# Patient Record
Sex: Female | Born: 1935 | Race: White | Hispanic: No | Smoking: Former smoker
Health system: Southern US, Community
[De-identification: ages and names within clinical notes are randomized; demographics above are authoritative.]

## PROBLEM LIST (undated history)

## (undated) DIAGNOSIS — E876 Hypokalemia: Secondary | ICD-10-CM

## (undated) DIAGNOSIS — I4891 Unspecified atrial fibrillation: Secondary | ICD-10-CM

## (undated) DIAGNOSIS — R42 Dizziness and giddiness: Secondary | ICD-10-CM

## (undated) DIAGNOSIS — I272 Pulmonary hypertension, unspecified: Secondary | ICD-10-CM

## (undated) DIAGNOSIS — R55 Syncope and collapse: Secondary | ICD-10-CM

## (undated) DIAGNOSIS — R634 Abnormal weight loss: Secondary | ICD-10-CM

## (undated) DIAGNOSIS — T7840XA Allergy, unspecified, initial encounter: Secondary | ICD-10-CM

## (undated) DIAGNOSIS — R0600 Dyspnea, unspecified: Secondary | ICD-10-CM

## (undated) DIAGNOSIS — R109 Unspecified abdominal pain: Secondary | ICD-10-CM

## (undated) DIAGNOSIS — R350 Frequency of micturition: Secondary | ICD-10-CM

## (undated) DIAGNOSIS — J449 Chronic obstructive pulmonary disease, unspecified: Secondary | ICD-10-CM

## (undated) DIAGNOSIS — I5043 Acute on chronic combined systolic (congestive) and diastolic (congestive) heart failure: Secondary | ICD-10-CM

## (undated) DIAGNOSIS — M1990 Unspecified osteoarthritis, unspecified site: Secondary | ICD-10-CM

## (undated) DIAGNOSIS — I5020 Unspecified systolic (congestive) heart failure: Secondary | ICD-10-CM

## (undated) DIAGNOSIS — S93402A Sprain of unspecified ligament of left ankle, initial encounter: Secondary | ICD-10-CM

## (undated) DIAGNOSIS — Z720 Tobacco use: Secondary | ICD-10-CM

## (undated) DIAGNOSIS — H409 Unspecified glaucoma: Secondary | ICD-10-CM

## (undated) DIAGNOSIS — R739 Hyperglycemia, unspecified: Secondary | ICD-10-CM

## (undated) DIAGNOSIS — R911 Solitary pulmonary nodule: Secondary | ICD-10-CM

## (undated) DIAGNOSIS — M810 Age-related osteoporosis without current pathological fracture: Secondary | ICD-10-CM

## (undated) DIAGNOSIS — E785 Hyperlipidemia, unspecified: Secondary | ICD-10-CM

## (undated) DIAGNOSIS — I10 Essential (primary) hypertension: Secondary | ICD-10-CM

## (undated) HISTORY — DX: Dyspnea, unspecified: R06.00

## (undated) HISTORY — DX: Frequency of micturition: R35.0

## (undated) HISTORY — PX: APPENDECTOMY: SHX54

## (undated) HISTORY — DX: Pulmonary hypertension, unspecified: I27.20

## (undated) HISTORY — DX: Essential (primary) hypertension: I10

## (undated) HISTORY — DX: Unspecified glaucoma: H40.9

## (undated) HISTORY — DX: Syncope and collapse: R55

## (undated) HISTORY — PX: CHOLECYSTECTOMY: SHX55

## (undated) HISTORY — DX: Unspecified abdominal pain: R10.9

## (undated) HISTORY — DX: Dizziness and giddiness: R42

## (undated) HISTORY — DX: Hyperglycemia, unspecified: R73.9

## (undated) HISTORY — PX: HERNIA REPAIR: SHX51

## (undated) HISTORY — DX: Tobacco use: Z72.0

## (undated) HISTORY — DX: Solitary pulmonary nodule: R91.1

## (undated) HISTORY — DX: Allergy, unspecified, initial encounter: T78.40XA

## (undated) HISTORY — DX: Hyperlipidemia, unspecified: E78.5

## (undated) HISTORY — DX: Acute on chronic combined systolic (congestive) and diastolic (congestive) heart failure: I50.43

## (undated) HISTORY — PX: HEMORRHOID SURGERY: SHX153

## (undated) HISTORY — DX: Abnormal weight loss: R63.4

## (undated) HISTORY — DX: Sprain of unspecified ligament of left ankle, initial encounter: S93.402A

## (undated) HISTORY — DX: Age-related osteoporosis without current pathological fracture: M81.0

## (undated) HISTORY — DX: Hypokalemia: E87.6

## (undated) HISTORY — PX: ABDOMINAL HYSTERECTOMY: SHX81

---

## 2004-10-15 ENCOUNTER — Ambulatory Visit: Payer: Self-pay | Admitting: Internal Medicine

## 2004-10-26 ENCOUNTER — Ambulatory Visit: Payer: Self-pay | Admitting: Internal Medicine

## 2004-11-19 ENCOUNTER — Emergency Department (HOSPITAL_COMMUNITY): Admission: EM | Admit: 2004-11-19 | Discharge: 2004-11-19 | Payer: Self-pay | Admitting: Emergency Medicine

## 2004-12-15 ENCOUNTER — Ambulatory Visit: Payer: Self-pay | Admitting: Internal Medicine

## 2004-12-17 ENCOUNTER — Ambulatory Visit: Payer: Self-pay | Admitting: Cardiology

## 2004-12-29 ENCOUNTER — Emergency Department (HOSPITAL_COMMUNITY): Admission: EM | Admit: 2004-12-29 | Discharge: 2004-12-30 | Payer: Self-pay | Admitting: Emergency Medicine

## 2005-01-03 ENCOUNTER — Ambulatory Visit: Payer: Self-pay | Admitting: Internal Medicine

## 2005-01-27 ENCOUNTER — Encounter: Admission: RE | Admit: 2005-01-27 | Discharge: 2005-02-23 | Payer: Self-pay | Admitting: Internal Medicine

## 2005-03-16 ENCOUNTER — Ambulatory Visit: Payer: Self-pay | Admitting: Internal Medicine

## 2005-04-27 ENCOUNTER — Ambulatory Visit: Payer: Self-pay | Admitting: Internal Medicine

## 2005-05-11 ENCOUNTER — Ambulatory Visit: Payer: Self-pay | Admitting: Internal Medicine

## 2005-05-18 ENCOUNTER — Ambulatory Visit: Payer: Self-pay | Admitting: Internal Medicine

## 2005-06-10 ENCOUNTER — Ambulatory Visit: Payer: Self-pay | Admitting: Pulmonary Disease

## 2005-06-16 ENCOUNTER — Ambulatory Visit (HOSPITAL_COMMUNITY): Admission: RE | Admit: 2005-06-16 | Discharge: 2005-06-16 | Payer: Self-pay | Admitting: Pulmonary Disease

## 2005-07-14 ENCOUNTER — Ambulatory Visit: Payer: Self-pay | Admitting: Pulmonary Disease

## 2005-08-12 ENCOUNTER — Encounter: Payer: Self-pay | Admitting: Internal Medicine

## 2005-08-12 ENCOUNTER — Ambulatory Visit: Payer: Self-pay | Admitting: Cardiology

## 2005-08-18 ENCOUNTER — Ambulatory Visit: Payer: Self-pay | Admitting: Pulmonary Disease

## 2006-08-24 ENCOUNTER — Ambulatory Visit: Payer: Self-pay | Admitting: Internal Medicine

## 2006-09-04 ENCOUNTER — Ambulatory Visit: Payer: Self-pay | Admitting: Internal Medicine

## 2007-02-14 ENCOUNTER — Ambulatory Visit: Payer: Self-pay | Admitting: Internal Medicine

## 2007-02-14 DIAGNOSIS — R1084 Generalized abdominal pain: Secondary | ICD-10-CM | POA: Insufficient documentation

## 2007-02-14 DIAGNOSIS — R112 Nausea with vomiting, unspecified: Secondary | ICD-10-CM

## 2007-02-14 DIAGNOSIS — I10 Essential (primary) hypertension: Secondary | ICD-10-CM

## 2007-02-15 LAB — CONVERTED CEMR LAB
ALT: 11 units/L (ref 0–35)
AST: 28 units/L (ref 0–37)
Albumin: 4.2 g/dL (ref 3.5–5.2)
Alkaline Phosphatase: 64 units/L (ref 39–117)
Amylase: 52 units/L (ref 27–131)
BUN: 4 mg/dL — ABNORMAL LOW (ref 6–23)
Basophils Absolute: 0 10*3/uL (ref 0.0–0.1)
Basophils Relative: 0.4 % (ref 0.0–1.0)
Bilirubin Urine: NEGATIVE
Bilirubin, Direct: 0.3 mg/dL (ref 0.0–0.3)
CO2: 28 meq/L (ref 19–32)
Calcium: 10.1 mg/dL (ref 8.4–10.5)
Chloride: 104 meq/L (ref 96–112)
Creatinine, Ser: 0.7 mg/dL (ref 0.4–1.2)
Crystals: NEGATIVE
Eosinophils Absolute: 0.1 10*3/uL (ref 0.0–0.6)
Eosinophils Relative: 1.3 % (ref 0.0–5.0)
GFR calc Af Amer: 103 mL/min
GFR calc non Af Amer: 85 mL/min
Glucose, Bld: 101 mg/dL — ABNORMAL HIGH (ref 70–99)
HCT: 38.8 % (ref 36.0–46.0)
Hemoglobin: 14 g/dL (ref 12.0–15.0)
Ketones, ur: NEGATIVE mg/dL
Leukocytes, UA: NEGATIVE
Lipase: 28 units/L (ref 11.0–59.0)
Lymphocytes Relative: 21.3 % (ref 12.0–46.0)
MCHC: 36 g/dL (ref 30.0–36.0)
MCV: 88.2 fL (ref 78.0–100.0)
Monocytes Absolute: 0.2 10*3/uL (ref 0.2–0.7)
Monocytes Relative: 3.2 % (ref 3.0–11.0)
Mucus, UA: NEGATIVE
Neutro Abs: 5.8 10*3/uL (ref 1.4–7.7)
Neutrophils Relative %: 73.8 % (ref 43.0–77.0)
Nitrite: NEGATIVE
Platelets: 182 10*3/uL (ref 150–400)
Potassium: 4.4 meq/L (ref 3.5–5.1)
RBC: 4.4 M/uL (ref 3.87–5.11)
RDW: 12.4 % (ref 11.5–14.6)
Sodium: 141 meq/L (ref 135–145)
Specific Gravity, Urine: <=1.005 (ref 1.000–1.03)
Total Bilirubin: 1 mg/dL (ref 0.3–1.2)
Total Protein, Urine: NEGATIVE mg/dL
Total Protein: 7.5 g/dL (ref 6.0–8.3)
Urine Glucose: NEGATIVE mg/dL
Urobilinogen, UA: 0.2 (ref 0.0–1.0)
WBC, UA: NONE SEEN cells/hpf
WBC: 7.8 10*3/uL (ref 4.5–10.5)
pH: 6.5 (ref 5.0–8.0)

## 2007-02-20 ENCOUNTER — Ambulatory Visit: Payer: Self-pay | Admitting: Internal Medicine

## 2007-02-20 DIAGNOSIS — R1013 Epigastric pain: Secondary | ICD-10-CM

## 2007-02-20 DIAGNOSIS — R3129 Other microscopic hematuria: Secondary | ICD-10-CM

## 2007-02-20 DIAGNOSIS — R1031 Right lower quadrant pain: Secondary | ICD-10-CM

## 2007-02-20 DIAGNOSIS — K3189 Other diseases of stomach and duodenum: Secondary | ICD-10-CM

## 2007-02-23 ENCOUNTER — Ambulatory Visit: Payer: Self-pay | Admitting: Internal Medicine

## 2007-03-13 ENCOUNTER — Ambulatory Visit: Payer: Self-pay | Admitting: Internal Medicine

## 2007-03-13 DIAGNOSIS — R310 Gross hematuria: Secondary | ICD-10-CM | POA: Insufficient documentation

## 2007-03-19 LAB — CONVERTED CEMR LAB
Bilirubin Urine: NEGATIVE
Ketones, ur: NEGATIVE mg/dL
Leukocytes, UA: NEGATIVE
Nitrite: NEGATIVE
Specific Gravity, Urine: 1.01 (ref 1.000–1.03)
Total Protein, Urine: NEGATIVE mg/dL
Urine Glucose: NEGATIVE mg/dL
Urobilinogen, UA: 0.2 (ref 0.0–1.0)
pH: 5.5 (ref 5.0–8.0)

## 2007-03-20 ENCOUNTER — Ambulatory Visit: Payer: Self-pay | Admitting: Internal Medicine

## 2007-03-20 DIAGNOSIS — L608 Other nail disorders: Secondary | ICD-10-CM | POA: Insufficient documentation

## 2007-03-26 LAB — CONVERTED CEMR LAB: TSH: 3.04 microintl units/mL (ref 0.35–5.50)

## 2007-09-20 ENCOUNTER — Ambulatory Visit: Payer: Self-pay | Admitting: Internal Medicine

## 2007-09-20 DIAGNOSIS — M545 Low back pain: Secondary | ICD-10-CM

## 2007-09-20 LAB — CONVERTED CEMR LAB
TSH: 2.13 microintl units/mL (ref 0.35–5.50)
Thyroglobulin Ab: 30.9 (ref 0.0–60.0)
Thyroperoxidase Ab SerPl-aCnc: <25 (ref 0.0–60.0)
Vit D, 1,25-Dihydroxy: 15 — ABNORMAL LOW (ref 30–89)

## 2007-09-21 ENCOUNTER — Telehealth: Payer: Self-pay | Admitting: Internal Medicine

## 2007-09-27 ENCOUNTER — Encounter: Payer: Self-pay | Admitting: Internal Medicine

## 2007-10-30 ENCOUNTER — Ambulatory Visit: Payer: Self-pay | Admitting: Internal Medicine

## 2007-10-30 DIAGNOSIS — E559 Vitamin D deficiency, unspecified: Secondary | ICD-10-CM | POA: Insufficient documentation

## 2007-10-30 LAB — CONVERTED CEMR LAB: Vit D, 1,25-Dihydroxy: 24 — ABNORMAL LOW (ref 30–89)

## 2007-10-31 ENCOUNTER — Telehealth: Payer: Self-pay | Admitting: Internal Medicine

## 2008-01-08 ENCOUNTER — Ambulatory Visit: Payer: Self-pay | Admitting: Internal Medicine

## 2008-01-08 DIAGNOSIS — H6500 Acute serous otitis media, unspecified ear: Secondary | ICD-10-CM | POA: Insufficient documentation

## 2008-05-07 ENCOUNTER — Ambulatory Visit: Payer: Self-pay | Admitting: Internal Medicine

## 2008-05-07 LAB — CONVERTED CEMR LAB
ALT: 12 units/L (ref 0–35)
AST: 19 units/L (ref 0–37)
BUN: 9 mg/dL (ref 6–23)
CO2: 31 meq/L (ref 19–32)
Calcium: 9.7 mg/dL (ref 8.4–10.5)
Chloride: 107 meq/L (ref 96–112)
Cholesterol: 214 mg/dL (ref 0–200)
Creatinine, Ser: 0.6 mg/dL (ref 0.4–1.2)
GFR calc Af Amer: 123 mL/min
GFR calc non Af Amer: 102 mL/min
Glucose, Bld: 114 mg/dL — ABNORMAL HIGH (ref 70–99)
HDL: 66.8 mg/dL (ref >39.0)
Sodium: 144 meq/L (ref 135–145)
TSH: 3.03 microintl units/mL (ref 0.35–5.50)
Total CHOL/HDL Ratio: 3.2
Triglycerides: 51 mg/dL (ref 0–149)
VLDL: 10 mg/dL (ref 0–40)
Vit D, 1,25-Dihydroxy: 34 (ref 30–89)

## 2008-05-19 ENCOUNTER — Ambulatory Visit: Payer: Self-pay | Admitting: Diagnostic Radiology

## 2008-05-19 ENCOUNTER — Ambulatory Visit: Payer: Self-pay | Admitting: Internal Medicine

## 2008-05-19 ENCOUNTER — Ambulatory Visit (HOSPITAL_BASED_OUTPATIENT_CLINIC_OR_DEPARTMENT_OTHER): Admission: RE | Admit: 2008-05-19 | Discharge: 2008-05-19 | Payer: Self-pay | Admitting: Internal Medicine

## 2008-05-19 ENCOUNTER — Telehealth: Payer: Self-pay | Admitting: Internal Medicine

## 2008-05-19 DIAGNOSIS — M79609 Pain in unspecified limb: Secondary | ICD-10-CM

## 2008-05-19 DIAGNOSIS — R079 Chest pain, unspecified: Secondary | ICD-10-CM

## 2008-05-19 LAB — CONVERTED CEMR LAB

## 2008-06-03 ENCOUNTER — Ambulatory Visit: Payer: Self-pay | Admitting: Internal Medicine

## 2008-06-03 ENCOUNTER — Ambulatory Visit: Payer: Self-pay | Admitting: Interventional Radiology

## 2008-06-03 ENCOUNTER — Ambulatory Visit (HOSPITAL_BASED_OUTPATIENT_CLINIC_OR_DEPARTMENT_OTHER): Admission: RE | Admit: 2008-06-03 | Discharge: 2008-06-03 | Payer: Self-pay | Admitting: Internal Medicine

## 2008-06-03 ENCOUNTER — Telehealth: Payer: Self-pay | Admitting: Internal Medicine

## 2008-06-03 DIAGNOSIS — M25569 Pain in unspecified knee: Secondary | ICD-10-CM | POA: Insufficient documentation

## 2008-06-03 DIAGNOSIS — J984 Other disorders of lung: Secondary | ICD-10-CM | POA: Insufficient documentation

## 2008-06-11 ENCOUNTER — Encounter: Payer: Self-pay | Admitting: Internal Medicine

## 2008-06-17 ENCOUNTER — Encounter: Payer: Self-pay | Admitting: Internal Medicine

## 2008-06-17 ENCOUNTER — Ambulatory Visit: Payer: Self-pay | Admitting: Internal Medicine

## 2008-06-18 ENCOUNTER — Ambulatory Visit: Payer: Self-pay | Admitting: Diagnostic Radiology

## 2008-06-18 ENCOUNTER — Encounter: Payer: Self-pay | Admitting: Internal Medicine

## 2008-06-18 ENCOUNTER — Ambulatory Visit (HOSPITAL_BASED_OUTPATIENT_CLINIC_OR_DEPARTMENT_OTHER): Admission: RE | Admit: 2008-06-18 | Discharge: 2008-06-18 | Payer: Self-pay | Admitting: Orthopedic Surgery

## 2008-07-01 ENCOUNTER — Telehealth: Payer: Self-pay | Admitting: Internal Medicine

## 2008-07-08 ENCOUNTER — Ambulatory Visit: Payer: Self-pay | Admitting: Internal Medicine

## 2008-07-08 DIAGNOSIS — M810 Age-related osteoporosis without current pathological fracture: Secondary | ICD-10-CM

## 2008-08-13 ENCOUNTER — Telehealth: Payer: Self-pay | Admitting: Internal Medicine

## 2008-10-21 ENCOUNTER — Ambulatory Visit: Payer: Self-pay | Admitting: Internal Medicine

## 2009-02-03 ENCOUNTER — Ambulatory Visit: Payer: Self-pay | Admitting: Internal Medicine

## 2009-02-03 DIAGNOSIS — R21 Rash and other nonspecific skin eruption: Secondary | ICD-10-CM

## 2009-02-03 LAB — HM MAMMOGRAPHY

## 2009-02-03 LAB — CONVERTED CEMR LAB

## 2009-04-06 ENCOUNTER — Telehealth: Payer: Self-pay | Admitting: Internal Medicine

## 2009-05-08 ENCOUNTER — Ambulatory Visit: Payer: Self-pay | Admitting: Internal Medicine

## 2009-05-08 DIAGNOSIS — L259 Unspecified contact dermatitis, unspecified cause: Secondary | ICD-10-CM

## 2009-05-12 ENCOUNTER — Telehealth: Payer: Self-pay | Admitting: Internal Medicine

## 2009-05-13 ENCOUNTER — Ambulatory Visit: Payer: Self-pay | Admitting: Internal Medicine

## 2009-05-13 DIAGNOSIS — R040 Epistaxis: Secondary | ICD-10-CM

## 2009-05-13 DIAGNOSIS — R350 Frequency of micturition: Secondary | ICD-10-CM

## 2009-05-13 LAB — CONVERTED CEMR LAB
Bilirubin Urine: NEGATIVE
Blood in Urine, dipstick: NEGATIVE
Glucose, Urine, Semiquant: NEGATIVE
Ketones, urine, test strip: NEGATIVE
Protein, U semiquant: NEGATIVE
Specific Gravity, Urine: <1.005
Urobilinogen, UA: 0.2
WBC Urine, dipstick: NEGATIVE
pH: 5

## 2009-06-05 ENCOUNTER — Ambulatory Visit: Payer: Self-pay | Admitting: Internal Medicine

## 2009-06-05 DIAGNOSIS — J310 Chronic rhinitis: Secondary | ICD-10-CM | POA: Insufficient documentation

## 2009-08-07 ENCOUNTER — Telehealth: Payer: Self-pay | Admitting: Internal Medicine

## 2009-08-07 ENCOUNTER — Ambulatory Visit (HOSPITAL_BASED_OUTPATIENT_CLINIC_OR_DEPARTMENT_OTHER): Admission: RE | Admit: 2009-08-07 | Discharge: 2009-08-07 | Payer: Self-pay | Admitting: Internal Medicine

## 2009-08-07 ENCOUNTER — Ambulatory Visit: Payer: Self-pay | Admitting: Internal Medicine

## 2009-08-07 ENCOUNTER — Ambulatory Visit: Payer: Self-pay | Admitting: Diagnostic Radiology

## 2009-08-07 DIAGNOSIS — R1032 Left lower quadrant pain: Secondary | ICD-10-CM

## 2009-08-07 LAB — CONVERTED CEMR LAB
Basophils Absolute: 0.1 10*3/uL (ref 0.0–0.1)
Basophils Relative: 1 % (ref 0–1)
CO2: 27 meq/L (ref 19–32)
Calcium: 9.1 mg/dL (ref 8.4–10.5)
Eosinophils Absolute: 0.1 10*3/uL (ref 0.0–0.7)
Eosinophils Relative: 2 % (ref 0–5)
Glucose, Urine, Semiquant: NEGATIVE
Hemoglobin: 13.9 g/dL (ref 12.0–15.0)
Lymphocytes Relative: 24 % (ref 12–46)
Lymphs Abs: 1.6 10*3/uL (ref 0.7–4.0)
MCHC: 32.9 g/dL (ref 30.0–36.0)
MCV: 90.2 fL (ref 78.0–100.0)
Monocytes Absolute: 0.5 10*3/uL (ref 0.1–1.0)
Monocytes Relative: 7 % (ref 3–12)
Neutro Abs: 4.4 10*3/uL (ref 1.7–7.7)
Neutrophils Relative %: 67 % (ref 43–77)
Nitrite: NEGATIVE
Platelets: 258 10*3/uL (ref 150–400)
Potassium: 4.4 meq/L (ref 3.5–5.3)
Protein, U semiquant: NEGATIVE
RBC: 4.68 M/uL (ref 3.87–5.11)
RDW: 13.2 % (ref 11.5–15.5)
Sodium: 139 meq/L (ref 135–145)
Specific Gravity, Urine: <1.005
WBC Urine, dipstick: NEGATIVE
WBC: 6.6 10*3/uL (ref 4.0–10.5)
pH: 5

## 2009-08-10 ENCOUNTER — Telehealth: Payer: Self-pay | Admitting: Internal Medicine

## 2009-09-30 ENCOUNTER — Telehealth: Payer: Self-pay | Admitting: Internal Medicine

## 2009-12-22 ENCOUNTER — Ambulatory Visit: Payer: Self-pay | Admitting: Internal Medicine

## 2009-12-22 DIAGNOSIS — R5381 Other malaise: Secondary | ICD-10-CM

## 2009-12-22 DIAGNOSIS — R5383 Other fatigue: Secondary | ICD-10-CM

## 2009-12-22 LAB — CONVERTED CEMR LAB
BUN: 8 mg/dL (ref 6–23)
CO2: 25 meq/L (ref 19–32)
Chloride: 104 meq/L (ref 96–112)
Creatinine, Ser: 0.58 mg/dL (ref 0.40–1.20)
Glucose, Bld: 80 mg/dL (ref 70–99)
Glucose, Urine, Semiquant: NEGATIVE
HCT: 40.4 % (ref 36.0–46.0)
Hemoglobin: 13.7 g/dL (ref 12.0–15.0)
Ketones, urine, test strip: NEGATIVE
MCV: 90 fL (ref 78.0–100.0)
Potassium: 4.5 meq/L (ref 3.5–5.3)
Protein, U semiquant: 30
RBC: 4.49 M/uL (ref 3.87–5.11)
RDW: 13.4 % (ref 11.5–15.5)
Specific Gravity, Urine: <1.005
Urobilinogen, UA: 0.2
WBC: 6.4 10*3/uL (ref 4.0–10.5)
pH: 6.5

## 2009-12-23 ENCOUNTER — Encounter: Payer: Self-pay | Admitting: Internal Medicine

## 2009-12-30 ENCOUNTER — Encounter: Payer: Self-pay | Admitting: Internal Medicine

## 2010-01-30 ENCOUNTER — Encounter: Payer: Self-pay | Admitting: Internal Medicine

## 2010-01-31 ENCOUNTER — Encounter: Payer: Self-pay | Admitting: Internal Medicine

## 2010-02-01 ENCOUNTER — Encounter: Payer: Self-pay | Admitting: Internal Medicine

## 2010-02-03 ENCOUNTER — Telehealth: Payer: Self-pay | Admitting: Internal Medicine

## 2010-04-27 NOTE — Progress Notes (Signed)
Summary: Test Results  Phone Note Outgoing Call   Summary of Call: call pt - no acute findings on CT of abd and pelvis.   I suggest she try taking miralax two times a day x 1-2 days needs appt next early next week if pain does not get better if symptoms get worse, I suggest ER eval over weekend Initial call taken by: D. Thomos Lemons DO,  Aug 07, 2009 5:05 PM  Follow-up for Phone Call        attempted to contact patient at [phone], no answer. A detailed voice message left informing patient per Dr Artist Pais instructions Follow-up by: Glendell Docker CMA,  Aug 07, 2009 5:09 PM    New/Updated Medications: POLYETHYLENE GLYCOL 3350  POWD (POLYETHYLENE GLYCOL 3350) 17 g  by mouth two times a day as needed Prescriptions: POLYETHYLENE GLYCOL 3350  POWD (POLYETHYLENE GLYCOL 3350) 17 g  by mouth two times a day as needed  #1 month x 0   Entered and Authorized by:   D. Thomos Lemons DO   Signed by:   D. Thomos Lemons DO on 08/07/2009   Method used:   Electronically to        [facility]       [address]       [place], [place]  [address]       Ph: [phone] or [phone]       Fax: [phone]   RxID:   [id number]

## 2010-04-27 NOTE — Progress Notes (Signed)
Summary: Boniva Refill  Phone Note Refill Request Message from:  Fax from Pharmacy on September 30, 2009 12:54 PM  Refills Requested: Medication #1:  BONIVA 150 MG TABS one by mouth qmontly   Dosage confirmed as above?Dosage Confirmed   Brand Name Necessary? No   Supply Requested: 1 month   Last Refilled: 05/20/2009  Method Requested: Electronic Next Appointment Scheduled: None on file Initial call taken by: Glendell Docker CMA,  September 30, 2009 12:54 PM    Prescriptions: BONIVA 150 MG TABS (IBANDRONATE SODIUM) one by mouth qmontly  #1 x 4   Entered by:   Glendell Docker CMA   Authorized by:   D. Thomos Lemons DO   Signed by:   Glendell Docker CMA on 09/30/2009   Method used:   Electronically to        [facility]       [address]       [place], [place]  [address]       Ph: [phone] or [phone]       Fax: [phone]   RxID:   [id number]

## 2010-04-27 NOTE — Assessment & Plan Note (Signed)
Summary: f/u appt. per phone note- Dr.Yoo- jr   Vital Signs:  Patient profile:   77 year old female Weight:      114.75 pounds BMI:     19.77 O2 Sat:      98 % Temp:     97.6 degrees F oral Pulse rate:   73 / minute Pulse rhythm:   regular Resp:     22 per minute BP sitting:   140 / 60  (right arm) Cuff size:   regular  Vitals Entered By: Glendell Docker CMA (May 13, 2009 10:40 AM)  Primary Care Provider:  Dondra Spry DO  CC:  Nose problem.  History of Present Illness:  77 y/o white female c/o mild nose bleeds.  when she blows her nose, she gets pinkish mucus.  she reports seeing small blood clot when she blew her nose.  inside of her nose feels crusty.  rash - better with prednisone.  feels like she is retaining more fluid since starting prednisone  Allergies: 1)  ! Pcn 2)  ! Allegra  Past History:  Past Medical History: Hypertension Tobacco Abuse History of Lung Nodule/Mass      CT of Chest 2007 - left lower lobar mass   (Evaluated by pulmonary - pt refused further workup) Vertigo        Family History: Heart disease and high blood pressure and her mother. Family history of diabetes.  History of lung cancer in distant relatives  .      Social History: Current Smoker Alcohol use-no   Widowed         Review of Systems       urinary freq since starting prednisone  Physical Exam  General:  alert, well-developed, and well-nourished.   Nose:  External nasal examination shows no deformity or inflammation. Nasal mucosa are pink and moist without lesions or exudates.  no friability,  no clots or active bleeding Lungs:  normal respiratory effort and normal breath sounds.   Heart:  normal rate, regular rhythm, and no gallop.   Extremities:  No lower extremity edema    Impression & Recommendations:  Problem # 1:  EPISTAXIS (ICD-784.7) mild epistaxis from left nares.  blew her nose and passed small clot .  no active bleeding.  use saline gel to keep  nare moist.   use nasal saline irrigation.  Patient advised to call office if symptoms persist or worsen.  Problem # 2:  FREQUENCY, URINARY (ICD-788.41) pt c/o increase in urinary freq since starting prednisone for rash.  u/a negative.  monitor for now.    Orders: UA Dipstick w/o Micro (manual) ([address])  Complete Medication List: 1)  Metoprolol Succinate 25 Mg Tb24 (Metoprolol succinate) .... One by mouth once daily 2)  Amlodipine Besylate 2.5 Mg Tabs (Amlodipine besylate) .... One by mouth qd 3)  Oscal 500/200 D-3 500-200 Mg-unit Tabs (Calcium-vitamin d) .... Take 1 tablet by mouth once a day 4)  Cetirizine Hcl 10 Mg Tabs (Cetirizine hcl) .... Take 1 tablet by mouth once a day 5)  Boniva 150 Mg Tabs (Ibandronate sodium) .... One by mouth qmontly 6)  Fluticasone Propionate 0.05 % Crea (Fluticasone propionate) .... Apply to affected area two times a day as needed 7)  Prednisone 20 Mg Tabs (Prednisone) .... One by mouth once daily x 4 days, 1/2 tab for 4 days, then 1/2 every othe day x 4 days  Patient Instructions: 1)  Use Lloyd Huger Med Sinus Rinse over the counter 2)  Use Ayr gel if your nose gets dry  Current Allergies (reviewed today): ! PCN ! [facility]  Laboratory Results   Urine Tests    Routine Urinalysis   Color: yellow Appearance: Clear Glucose: negative   (Normal Range: Negative) Bilirubin: negative   (Normal Range: Negative) Ketone: negative   (Normal Range: Negative) Spec. Gravity: <1.005   (Normal Range: 1.003-1.035) Blood: negative   (Normal Range: Negative) pH: 5.0   (Normal Range: 5.0-8.0) Protein: negative   (Normal Range: Negative) Urobilinogen: 0.2   (Normal Range: 0-1) Nitrite: negative   (Normal Range: Negative) Leukocyte Esterace: negative   (Normal Range: Negative)

## 2010-04-27 NOTE — Progress Notes (Signed)
Summary: Call a Nurse Report  Phone Note From Other Clinic   Caller: Call a Nurse Summary of Call: [facility] Triage Call Report Triage Record Num: [id number] Operator: Frederico Hamman Patient Name: Beth [name] Call Date & Time: 08/07/2009 7:43:31PM Patient Phone: [phone] PCP: Thomos Lemons Patient Gender: Female PCP Fax : [phone] Patient DOB: January 09, 1924 Practice Name: [facility] Reason for Call: Corrie Dandy, Lab Tech from [facility] calling with lab results done on specimen collected @ 1734 on 5/13. Glucose-105 high Na-139 K-4.4 CL- 105 Co -27 BUN 13 Creat-0.81 Ca- 9.1 Anion gap 7.0 CBC with DIFF WBC -6 RBC4.68 HGB 13.9 HCT 42.2 MCV 90.2 MCH 29.7 plt 258 granulocyte 67% absolute gran 4.4 Lymph % 24 Absolute lymph- 1.6 RDW 13.2 MCHC 32.9 MONO%-7 Absolute mono- 0.5 EOS% 2 Absolute EOS- 0.1 Baso % 1 Absolute Baso 0.1 Care advice given.Lab work faxed to office. Protocol(s) Used: PCP Calls, No Triage (Adult) Recommended Outcome per Protocol: Call Provider within 24 Hours Reason for Outcome: Lab calling with test results Care Advice:  ~ 08/07/2009 8:07:11PM Page 1 of 2 [id number] Call-A-Nurse Triage Call Report Patient Name: Beth [name] continuation page/s 05/ Initial call taken by: Glendell Docker CMA,  Aug 10, 2009 8:26 AM

## 2010-04-27 NOTE — Assessment & Plan Note (Signed)
Summary: 2 mo. f/u - jr   Vital Signs:  Patient profile:   76 year old female Height:      64 inches Weight:      112.25 pounds BMI:     19.34 O2 Sat:      98 % on Room air Temp:     97.7 degrees F oral Pulse rate:   68 / minute Pulse rhythm:   irregular Resp:     16 per minute BP sitting:   124 / 70  (right arm) Cuff size:   regular  Vitals Entered By: Glendell Docker CMA (Aug 07, 2009 2:54 PM)  O2 Flow:  Room air CC: Rm 3- 2 month follow up disease management, Abdominal pain Comments c/o lower left side abdominal pain, sudden onset today, refill on Boniva, questions about taking extra calcium   Primary Care Provider:  Dondra Spry DO  CC:  Rm 3- 2 month follow up disease management and Abdominal pain.  History of Present Illness:  Abdominal Pain      This is an 77 year old woman who presents with Abdominal pain.  The patient denies nausea, vomiting, diarrhea, and constipation.  The location of the pain is left upper quadrant and left lower quadrant.  The pain is described as intermittent.  The patient denies the following symptoms: fever.   severity 9 out of 10.  she has normal BM this morning.  she has chronic urinary urgency freq x 1 yr no other urinary symptoms  Preventive Screening-Counseling & Management  Alcohol-Tobacco     Smoking Status: current  Allergies: 1)  ! Pcn 2)  ! Allegra  Past History:  Past Medical History: Hypertension Tobacco Abuse History of Lung Nodule/Mass       CT of Chest 2007 - left lower lobar mass   (Evaluated by pulmonary - pt refused further workup) Vertigo          Family History: Heart disease and high blood pressure and her mother. Family history of diabetes.  History of lung cancer in distant relatives  .        Physical Exam  General:  alert, well-developed, and well-nourished.   Lungs:  normal respiratory effort and normal breath sounds.   Heart:  normal rate, regular rhythm, and no gallop.   Abdomen:  soft.   LLQ, suprapubic tenderness Increased bowel sounds Extremities:  No lower extremity edema    Impression & Recommendations:  Problem # 1:  ABDOMINAL PAIN, LEFT LOWER QUADRANT (ICD-789.04) 77 y/o white female with intermittent shart LLQ pain.  rule out  kidney stone and diverticulitis.   Orders: T-Basic Metabolic Panel [id number]) T-CBC w/Diff [phone]) CT with Contrast (CT w/ contrast) CT without Contrast (CT w/o contrast) UA Dipstick w/o Micro (manual) ([id number])  Complete Medication List: 1)  Metoprolol Succinate 25 Mg Tb24 (Metoprolol succinate) .... One by mouth once daily 2)  Amlodipine Besylate 2.5 Mg Tabs (Amlodipine besylate) .... One by mouth qd 3)  Oscal 500/200 D-3 500-200 Mg-unit Tabs (Calcium-vitamin d) .... Take 1 tablet by mouth once a day 4)  Boniva 150 Mg Tabs (Ibandronate sodium) .... One by mouth qmontly 5)  Triamcinolone Acetonide 0.5 % Crea (Triamcinolone acetonide) .... Apply two times a day x 2 weeks 6)  Nasonex 50 Mcg/act Susp (Mometasone furoate) .... 2 sprays each nostril once daily 7)  Zyrtec Allergy 10 Mg Tbdp (Cetirizine hcl) .... Take 1 tablet by mouth once a day as needed 8)  Multivitamins Tabs (Multiple vitamin) .Marland KitchenMarland KitchenMarland Kitchen  Take 1 tablet by mouth once a day 9)  Polyethylene Glycol 3350 Powd (Polyethylene glycol 3350) .Marland KitchenMarland KitchenMarland Kitchen 17 g  by mouth two times a day as needed  Current Allergies (reviewed today): ! PCN ! [facility]    Laboratory Results   Urine Tests    Routine Urinalysis   Color: yellow Appearance: Clear Glucose: negative   (Normal Range: Negative) Bilirubin: negative   (Normal Range: Negative) Ketone: negative   (Normal Range: Negative) Spec. Gravity: <1.005   (Normal Range: 1.003-1.035) Blood: small   (Normal Range: Negative) pH: 5.0   (Normal Range: 5.0-8.0) Protein: negative   (Normal Range: Negative) Urobilinogen: 0.2   (Normal Range: 0-1) Nitrite: negative   (Normal Range: Negative) Leukocyte Esterace: negative   (Normal Range:  Negative)

## 2010-04-27 NOTE — Assessment & Plan Note (Signed)
Summary: 1 month follow up/mhf   Vital Signs:  Patient profile:   77 year old female Weight:      113.25 pounds O2 Sat:      98 % on Room air Temp:     97.5 degrees F oral Pulse rate:   87 / minute Pulse rhythm:   regular Resp:     18 per minute BP sitting:   124 / 60  (right arm) Cuff size:   regular  Vitals Entered By: Glendell Docker CMA (June 05, 2009 3:09 PM)  O2 Flow:  Room air CC: Rm 2- 1 Month Follow up    Primary Care Provider:  Dondra Spry DO  CC:  Rm 2- 1 Month Follow up .  History of Present Illness: 77 y/o white female for f/u  she c/o persisent post nasal  drip no improvement with zyrtec no nose bleeds  still getting patches on arm and right groin.   leg irritation also not resolved.  Allergies: 1)  ! Pcn 2)  ! Allegra  Past History:  Past Medical History: Hypertension Tobacco Abuse History of Lung Nodule/Mass      CT of Chest 2007 - left lower lobar mass   (Evaluated by pulmonary - pt refused further workup) Vertigo         Family History: Heart disease and high blood pressure and her mother. Family history of diabetes.  History of lung cancer in distant relatives  .       Social History: Current Smoker - 1/2 carton per week Alcohol use-no   Widowed          Physical Exam  General:  alert, well-developed, and well-nourished.   Lungs:  normal respiratory effort and normal breath sounds.   Heart:  normal rate, regular rhythm, and no gallop.   Skin:  2-3 cm scaly patch on right upper arm and similar sized patch near right inguinal area   Impression & Recommendations:  Problem # 1:  DERMATITIS (ICD-692.9) Pt with eczematous patch on right upper arm and right groin.  use higher potency steroid crm.  she tried changing laundry detergent and soap w/o improvement.  The following medications were removed from the medication list:    Cetirizine Hcl 10 Mg Tabs (Cetirizine hcl) .Marland Kitchen... Take 1 tablet by mouth once a day    Fluticasone  Propionate 0.05 % Crea (Fluticasone propionate) .Marland Kitchen... Apply to affected area two times a day as needed    Prednisone 20 Mg Tabs (Prednisone) ..... One by mouth once daily x 4 days, 1/2 tab for 4 days, then 1/2 every othe day x 4 days Her updated medication list for this problem includes:    Triamcinolone Acetonide 0.5 % Crea (Triamcinolone acetonide) .Marland Kitchen... Apply two times a day x 2 weeks  Problem # 2:  RHINITIS (ICD-472.0) Pt with chronic rhinorhhea.  no improvement with zyrtec.  trial of nasonex.  Complete Medication List: 1)  Metoprolol Succinate 25 Mg Tb24 (Metoprolol succinate) .... One by mouth once daily 2)  Amlodipine Besylate 2.5 Mg Tabs (Amlodipine besylate) .... One by mouth qd 3)  Oscal 500/200 D-3 500-200 Mg-unit Tabs (Calcium-vitamin d) .... Take 1 tablet by mouth once a day 4)  Boniva 150 Mg Tabs (Ibandronate sodium) .... One by mouth qmontly 5)  Triamcinolone Acetonide 0.5 % Crea (Triamcinolone acetonide) .... Apply two times a day x 2 weeks 6)  Nasonex 50 Mcg/act Susp (Mometasone furoate) .... 2 sprays each nostril once daily  Patient Instructions:  1)  Please schedule a follow-up appointment in 2 months. Prescriptions: NASONEX 50 MCG/ACT SUSP (MOMETASONE FUROATE) 2 sprays each nostril once daily  #1 x 3   Entered and Authorized by:   D. Thomos Lemons DO   Signed by:   D. Thomos Lemons DO on 06/05/2009   Method used:   Electronically to        [facility]       [address]       [place], [place]  [address]       Ph: [phone] or [phone]       Fax: [phone]   RxID:   [id number] TRIAMCINOLONE ACETONIDE 0.5 % CREA (TRIAMCINOLONE ACETONIDE) apply two times a day x 2 weeks  #30 grams x 1   Entered and Authorized by:   D. Thomos Lemons DO   Signed by:   D. Thomos Lemons DO on 06/05/2009   Method used:   Electronically to        [facility]       [address]       [place], [place]  [address]       Ph: [phone] or [phone]       Fax:  [phone]   RxID:   [id number]   Current Allergies (reviewed today): ! PCN ! ALLEGRA

## 2010-04-27 NOTE — Letter (Signed)
   [facility] [address] [place], [place]  [address]  [place] Phone: [phone]      December 23, 2009   Merrilee Struss [address] [place], [place] [address]  RE:  LAB RESULTS  Dear  Ms. Henri,  The following is an interpretation of your most recent lab tests.  Please take note of any instructions provided or changes to medications that have resulted from your lab work.  ELECTROLYTES:  Good - no changes needed  KIDNEY FUNCTION TESTS:  Good - no changes needed   THYROID STUDIES:  Thyroid studies normal TSH: 2.805          Sincerely Yours,    Dr. Thomos Lemons  Appended Document:  mailed

## 2010-04-27 NOTE — Progress Notes (Signed)
Summary: Follow up appointment  Phone Note Outgoing Call   Call placed by: Glendell Docker CMA,  February 03, 2010 3:32 PM Call placed to: Patient Summary of Call: call placed to patient at  [phone], no answer. A detailed voice message was left for patient to return call regarding fainting episode. She was informed office note received from [facility] and it was suggested by Dr Kateri Plummer that she follow up with Dr Artist Pais. (Her next scheduled appointment is for 05/2010) Initial call taken by: Glendell Docker CMA,  February 03, 2010 3:32 PM  Follow-up for Phone Call        patient returned phone call stating she is having trouble walking since she broke her ankle during a fall. She states she will follow up with Dr Artist Pais after she has recovered from her ankle injury Follow-up by: Glendell Docker CMA,  February 03, 2010 3:48 PM  Additional Follow-up for Phone Call Additional follow up Details #1::        noted Additional Follow-up by: D. Thomos Lemons DO,  February 04, 2010 1:29 PM

## 2010-04-27 NOTE — Letter (Signed)
Summary: [facility]  [facility]   Imported By: Lanelle Bal 02/12/2010 11:35:59  _____________________________________________________________________  External Attachment:    Type:   Image     Comment:   External Document

## 2010-04-27 NOTE — Assessment & Plan Note (Signed)
Summary: 5 month fu/dt   Vital Signs:  Patient profile:   77 year old Beth [name] Height:      64 inches Weight:      112.75 pounds BMI:     19.42 O2 Sat:      97 % on Room air Temp:     97.2 degrees F oral Pulse rate:   71 / minute Pulse rhythm:   regular Resp:     20 per minute BP sitting:   112 / 72  (right arm) Cuff size:   regular  Vitals Entered By: Glendell Docker CMA (December 22, 2009 3:05 PM)  O2 Flow:  Room air  Contraindications/Deferment of Procedures/Staging:    Test/Procedure: FLU VAX    Reason for deferment: patient declined  CC: follow-up visit Is Patient Diabetic? No Pain Assessment Patient in pain? no      Comments questions about medication, states generic change and she has not felt well since change, c/o decrease in energy   Primary Care Provider:  D. Thomos Lemons DO  CC:  follow-up visit.  History of Present Illness: 77 y/o white Beth [name] [facility] changed different formulation of generic amlodipine since change - pt feels more tired   intermittent left upper chest pain lasts 5-10 minutes no shortness of breath character - sharp initially, then dull / uncomfortable  Current Diet: Breakfast: apple sauce, dry toast, 2 cups of coffee Lunch: sandwich, salad, apple DInner: salad,  occ meatballs Snacks:  vanilla wafer, peanut butter - reduced fat,  peanut butter Beverage:  coffee and water    Preventive Screening-Counseling & Management  Alcohol-Tobacco     Smoking Status: current  Allergies: 1)  ! Pcn 2)  ! Allegra  Past History:  Past Medical History: Hypertension Tobacco Abuse History of Lung Nodule/Mass       CT of Chest 2007 - left lower lobar mass   (Evaluated by pulmonary - pt refused further workup) Vertigo          Family History: Heart disease and high blood pressure and her mother. Family history of diabetes.  History of lung cancer in distant relatives  .         Social History: Current Smoker - 1/2 carton per  week Alcohol use-no   Widowed           Physical Exam  General:  alert, well-developed, and well-nourished.   Lungs:  normal respiratory effort and normal breath sounds.   Heart:  normal rate, regular rhythm, and no gallop.   Extremities:  No lower extremity edema    Impression & Recommendations:  Problem # 1:  MALAISE AND FATIGUE (ICD-780.79) rule out anemia  Orders: T-Basic Metabolic Panel [id number]) T-CBC No Diff ([id number]) T-TSH ([id number])  Problem # 2:  FREQUENCY, URINARY (ICD-788.41) u/a shows possible UTI.  tx with cipro. also cut back on caffeine  Orders: T-Culture, Urine ([id number])  Complete Medication List: 1)  Metoprolol Succinate 25 Mg Tb24 (Metoprolol succinate) .... One by mouth once daily 2)  Amlodipine Besylate 2.5 Mg Tabs (Amlodipine besylate) .... One by mouth qd 3)  Oscal 500/200 D-3 500-200 Mg-unit Tabs (Calcium-vitamin d) .... Take 1 tablet by mouth once a day 4)  Ayr 0.65 % Soln (Saline) .... 2 sprays each nostril once daily as needed 5)  Zyrtec Allergy 10 Mg Tbdp (Cetirizine hcl) .... Take 1 tablet by mouth once a day as needed 6)  Multivitamins Tabs (Multiple vitamin) .... Take 1 tablet by mouth once a day  7)  Ciprofloxacin Hcl 250 Mg Tabs (Ciprofloxacin hcl) .... One by mouth two times a day  Other Orders: Specimen Handling ([id number])  Patient Instructions: 1)  Please switch to DeCaf coffee 2)  Please schedule a follow-up appointment in 6 months. Prescriptions: CIPROFLOXACIN HCL 250 MG TABS (CIPROFLOXACIN HCL) one by mouth two times a day  #10 x 0   Entered and Authorized by:   D. Thomos Lemons DO   Signed by:   D. Thomos Lemons DO on 12/22/2009   Method used:   Electronically to        [facility]       [address]       [place], [place]  [address]       Ph: [phone] or [phone]       Fax: [phone]   RxID:   [id number]    Immunization History:  Influenza Immunization History:    Influenza:   declined (12/22/2009)   Current Allergies (reviewed today): ! PCN ! [facility]   Laboratory Results   Urine Tests    Routine Urinalysis   Color: lt. yellow Appearance: Clear Glucose: negative   (Normal Range: Negative) Bilirubin: negative   (Normal Range: Negative) Ketone: negative   (Normal Range: Negative) Spec. Gravity: <1.005   (Normal Range: 1.003-1.035) Blood: moderate   (Normal Range: Negative) pH: 6.5   (Normal Range: 5.0-8.0) Protein: 30   (Normal Range: Negative) Urobilinogen: 0.2   (Normal Range: 0-1) Nitrite: negative   (Normal Range: Negative) Leukocyte Esterace: trace   (Normal Range: Negative)

## 2010-04-27 NOTE — Progress Notes (Signed)
Summary: Medication Refills  Phone Note Refill Request Message from:  Fax from Pharmacy on April 06, 2009 9:40 AM  Refills Requested: Medication #1:  AMLODIPINE BESYLATE 2.5 MG  TABS one by mouth qd   Dosage confirmed as above?Dosage Confirmed   Brand Name Necessary? No   Supply Requested: 1 month  Medication #2:  METOPROLOL SUCCINATE 25 MG  TB24 one by mouth once daily   Dosage confirmed as above?Dosage Confirmed   Brand Name Necessary? No   Supply Requested: 1 month  Method Requested: Electronic Next Appointment Scheduled: 05-08-09 3pm Dr Artist Pais  Initial call taken by: Roselle Locus,  April 06, 2009 9:41 AM  Follow-up for Phone Call        Rx  sent electronically to pharmacy Follow-up by: Glendell Docker CMA,  April 06, 2009 3:55 PM    Prescriptions: METOPROLOL SUCCINATE 25 MG  TB24 (METOPROLOL SUCCINATE) one by mouth once daily  #30 x 5   Entered by:   Glendell Docker CMA   Authorized by:   D. Thomos Lemons DO   Signed by:   Glendell Docker CMA on 04/06/2009   Method used:   Electronically to        [facility]       [address]       [place], [place]  [address]       Ph: [phone] or [phone]       Fax: [phone]   RxID:   [id number] AMLODIPINE BESYLATE 2.5 MG  TABS (AMLODIPINE BESYLATE) one by mouth qd  #30 x 5   Entered by:   Glendell Docker CMA   Authorized by:   D. Thomos Lemons DO   Signed by:   Glendell Docker CMA on 04/06/2009   Method used:   Electronically to        [facility]       [address]       [place], [place]  [address]       Ph: [phone] or [phone]       Fax: [phone]   RxID:   [id number]

## 2010-04-27 NOTE — Assessment & Plan Note (Signed)
Summary: 6 MONTH ROV-CH   Vital Signs:  Patient profile:   77 year old female Weight:      111.50 pounds BMI:     19.21 O2 Sat:      98 % on Room air Temp:     97.5 degrees F oral Pulse rate:   71 / minute Pulse rhythm:   regular Resp:     18 per minute BP sitting:   142 / 60  (right arm) Cuff size:   regular  Vitals Entered By: Glendell Docker CMA (May 08, 2009 3:31 PM)  O2 Flow:  Room air  Primary Care Provider:  D. Thomos Lemons DO  CC:  6 Month follow up .  History of Present Illness: 6 Month Follow up   77 y/o c/o  unresolved body rash she was seen by Dr. Terri Piedra topical steroid works initially but rash comes back no new soaps or detergents  Allergies: 1)  ! Pcn 2)  ! Allegra  Past History:  Past Medical History: Hypertension Tobacco Abuse History of Lung Nodule/Mass      CT of Chest 2007 - left lower lobar mass   (Evaluated by pulmonary - pt refused further workup) Vertigo       Family History: Heart disease and high blood pressure and her mother. Family history of diabetes.  History of lung cancer in distant relatives .      Social History: Current Smoker Alcohol use-no   Widowed        Physical Exam  General:  alert, well-developed, and well-nourished.   Lungs:  normal respiratory effort and normal breath sounds.   Heart:  normal rate, regular rhythm, and no gallop.   Skin:  macular rash over lower ext, inner thigh, and buttocks   Impression & Recommendations:  Problem # 1:  DERMATITIS (ICD-692.9) Pt with macular rash over lower legs thighs and back.  she was seen by Dr. Terri Piedra.  topical steroids works short term but rash comes back.  no new soaps or detergents. trial of prednisone taper.  if no improvement,  perform punch biopsy at next OV.   Her updated medication list for this problem includes:    Cetirizine Hcl 10 Mg Tabs (Cetirizine hcl) .Marland Kitchen... Take 1 tablet by mouth once a day    Fluticasone Propionate 0.05 % Crea (Fluticasone  propionate) .Marland Kitchen... Apply to affected area two times a day as needed    Prednisone 20 Mg Tabs (Prednisone) ..... One by mouth once daily x 4 days, 1/2 tab for 4 days, then 1/2 every othe day x 4 days  Complete Medication List: 1)  Metoprolol Succinate 25 Mg Tb24 (Metoprolol succinate) .... One by mouth once daily 2)  Amlodipine Besylate 2.5 Mg Tabs (Amlodipine besylate) .... One by mouth qd 3)  Oscal 500/200 D-3 500-200 Mg-unit Tabs (Calcium-vitamin d) .... Take 1 tablet by mouth once a day 4)  Cetirizine Hcl 10 Mg Tabs (Cetirizine hcl) .... Take 1 tablet by mouth once a day 5)  Boniva 150 Mg Tabs (Ibandronate sodium) .... One by mouth qmontly 6)  Fluticasone Propionate 0.05 % Crea (Fluticasone propionate) .... Apply to affected area two times a day as needed 7)  Prednisone 20 Mg Tabs (Prednisone) .... One by mouth once daily x 4 days, 1/2 tab for 4 days, then 1/2 every othe day x 4 days  Patient Instructions: 1)  Please schedule a follow-up appointment in 1 month. Prescriptions: PREDNISONE 20 MG TABS (PREDNISONE) one by mouth once daily  x 4 days, 1/2 tab for 4 days, then 1/2 every othe day x 4 days  #10 x 0   Entered and Authorized by:   D. Thomos Lemons DO   Signed by:   D. Thomos Lemons DO on 05/08/2009   Method used:   Electronically to        [id number]       [address]       [place], [place]  [address]       Ph: [phone] or [phone]       Fax: [phone]   RxID:   [id number]   Current Allergies (reviewed today): ! PCN ! ALLEGRA

## 2010-04-27 NOTE — Letter (Signed)
Summary: [facility]   Imported By: Lanelle Bal 02/10/2010 09:31:26  _____________________________________________________________________  External Attachment:    Type:   Image     Comment:   External Document

## 2010-04-27 NOTE — Progress Notes (Signed)
Summary: ? side effects from med.  Phone Note Call from Patient   Caller: Patient Summary of Call: pt. is not sure if the Prednisone she is on is making her Left side of nose bleed. Does state she has nasal drip and when she blows her nose that blood clots comes out, and also feels shaky in the knees. Call back patient at [phone] Initial call taken by: Michaelle Copas,  May 12, 2009 10:56 AM  Follow-up for Phone Call        I suggest OV Follow-up by: D. Thomos Lemons DO,  May 12, 2009 1:23 PM  Additional Follow-up for Phone Call Additional follow up Details #1::        made her a appt. 05/13/09.Michaelle Copas  May 12, 2009 2:37 PM

## 2010-04-27 NOTE — Progress Notes (Signed)
Summary: Amlodipine Refill  Phone Note Refill Request Message from:  Fax from Pharmacy on September 30, 2009 8:59 AM  Refills Requested: Medication #1:  AMLODIPINE BESYLATE 2.5 MG  TABS one by mouth qd   Dosage confirmed as above?Dosage Confirmed   Brand Name Necessary? No   Supply Requested: 1 month   Last Refilled: 09/26/2009  Method Requested: Electronic Next Appointment Scheduled: None Initial call taken by: Glendell Docker CMA,  September 30, 2009 8:59 AM    Prescriptions: AMLODIPINE BESYLATE 2.5 MG  TABS (AMLODIPINE BESYLATE) one by mouth qd  #30 x 5   Entered by:   Glendell Docker CMA   Authorized by:   D. Thomos Lemons DO   Signed by:   Glendell Docker CMA on 09/30/2009   Method used:   Electronically to        [id number]       [address]       [place], [place]  [address]       Ph: [phone] or [phone]       Fax: [phone]   RxID:   [id number]

## 2010-04-27 NOTE — Progress Notes (Signed)
Summary: Metoprolol Refill  Phone Note Refill Request Message from:  Fax from Pharmacy on September 30, 2009 8:58 AM  Refills Requested: Medication #1:  METOPROLOL SUCCINATE 25 MG  TB24 one by mouth once daily   Dosage confirmed as above?Dosage Confirmed   Brand Name Necessary? No   Supply Requested: 1 month  Method Requested: Electronic Next Appointment Scheduled: None Initial call taken by: Glendell Docker CMA,  September 30, 2009 8:58 AM    Prescriptions: METOPROLOL SUCCINATE 25 MG  TB24 (METOPROLOL SUCCINATE) one by mouth once daily  #30 x 5   Entered by:   Glendell Docker CMA   Authorized by:   D. Thomos Lemons DO   Signed by:   Glendell Docker CMA on 09/30/2009   Method used:   Electronically to        [id number]       [address]       [place], [place]  [address]       Ph: [phone] or [phone]       Fax: [phone]   RxID:   [id number]

## 2010-04-29 ENCOUNTER — Encounter: Payer: Self-pay | Admitting: Internal Medicine

## 2010-05-25 ENCOUNTER — Encounter: Payer: Self-pay | Admitting: Internal Medicine

## 2010-05-31 ENCOUNTER — Telehealth: Payer: Self-pay | Admitting: Internal Medicine

## 2010-06-08 NOTE — Progress Notes (Signed)
Summary:  Housing financial forms  Phone Note Call from Patient Call back at [facility] Phone [phone]   Caller: Patient Call For: D. Thomos Lemons DO Summary of Call: pt called and said that she had faxed forms over for dr. Artist Pais to fill out regarding finacial forms. She said that form needs to be filled out saying that dr. Artist Pais told her to get OTC meds and her copays that she made? She stated that if these forms did not get filled out then her rent will go up? please assist.  Initial call taken by: Elba Barman,  May 31, 2010 1:48 PM  Follow-up for Phone Call        call return to patient at [phone], she was informed additional information needed was pending and once that has been received the form would be faxed to [address] to Ms. Allen Follow-up by: Glendell Docker CMA,  June 01, 2010 9:24 AM  Additional Follow-up for Phone Call Additional follow up Details #1::        Forms for housing assistance have been faxed to [phone]. call placed to patient at [phone], she was informed  housing forms have been faxed Additional Follow-up by: Glendell Docker CMA,  June 02, 2010 4:26 PM

## 2010-06-15 ENCOUNTER — Ambulatory Visit (INDEPENDENT_AMBULATORY_CARE_PROVIDER_SITE_OTHER): Payer: Medicare Other | Admitting: Internal Medicine

## 2010-06-15 ENCOUNTER — Encounter: Payer: Self-pay | Admitting: Internal Medicine

## 2010-06-15 VITALS — BP 120/60 | HR 76 | Temp 98.0°F | Resp 18 | Ht 64.0 in | Wt 111.0 lb

## 2010-06-15 DIAGNOSIS — I10 Essential (primary) hypertension: Secondary | ICD-10-CM

## 2010-06-15 DIAGNOSIS — J209 Acute bronchitis, unspecified: Secondary | ICD-10-CM

## 2010-06-15 MED ORDER — PREDNISONE 1 MG PO TABS: 10.0000 mg | ORAL_TABLET | ORAL | Status: DC

## 2010-06-15 MED ORDER — METOPROLOL SUCCINATE ER 25 MG PO TB24: 25.0000 mg | ORAL_TABLET | Freq: Every day | ORAL | Status: DC

## 2010-06-15 MED ORDER — AMLODIPINE BESYLATE 2.5 MG PO TABS: 2.5000 mg | ORAL_TABLET | Freq: Every day | ORAL | Status: DC

## 2010-06-15 MED ORDER — PREDNISONE (PAK) 10 MG PO TABS: ORAL_TABLET | ORAL | Status: DC

## 2010-06-15 MED ORDER — FLUTICASONE-SALMETEROL 250-50 MCG/DOSE IN AEPB: 1.0000 | INHALATION_SPRAY | Freq: Two times a day (BID) | RESPIRATORY_TRACT | Status: DC

## 2010-06-15 MED ORDER — DOXYCYCLINE HYCLATE 50 MG PO CAPS: 100.0000 mg | ORAL_CAPSULE | Freq: Two times a day (BID) | ORAL | Status: AC

## 2010-06-15 NOTE — Progress Notes (Signed)
  Subjective:    Patient ID: Beth [name], female    DOB: September 22, 1923, 77 y.o.   MRN: [id number]  HPI 77 y/o female c/o chest and sinus congestion Onset 1 week. Cough is non productive Chest is not hurting Denies SOB or dyspnea   Past Medical History  Diagnosis Date  . Hypertension   . Lung nodule     CT of chest 2007-left loewr lobar mass- evaluated by pulmonary- patient refused further work up  . Tobacco abuse    History   Social History  . Marital Status: Widowed    Spouse Name: N/A    Number of Children: N/A  . Years of Education: N/A   Social History Main Topics  . Smoking status: Current Everyday Smoker -- 0.2 packs/day for 60 years  . Smokeless tobacco: None  . Alcohol Use:   . Drug Use:   . Sexually Active:    Other Topics Concern  . None   Social History Narrative  . None   Review of Systems  Constitutional: Negative for fever and chills.  HENT: Positive for congestion and postnasal drip.   Respiratory: Positive for cough and chest tightness. Negative for shortness of breath.        Objective:   Physical Exam  Constitutional: She is oriented to person, place, and time. She appears well-developed and well-nourished. No distress.  HENT:  Head: Normocephalic and atraumatic.  Right Ear: External ear normal.  Left Ear: External ear normal.  Eyes: Pupils are equal, round, and reactive to light.  Neck: Neck supple.  Cardiovascular: Normal rate, regular rhythm and normal heart sounds.   Pulmonary/Chest: Effort normal. She has wheezes.       Mild coarse breath sounds bilaterally  Lymphadenopathy:    She has no cervical adenopathy.  Neurological: She is alert and oriented to person, place, and time.  Skin: Skin is warm and dry.  Psychiatric: She has a normal mood and affect. Her behavior is normal.          Assessment & Plan:

## 2010-06-15 NOTE — Letter (Signed)
Summary: Verification of Recurring Medical Expenses  Verification of Recurring Medical Expenses   Imported By: Maryln Gottron 06/07/2010 12:44:40  _____________________________________________________________________  External Attachment:    Type:   Image     Comment:   External Document

## 2010-06-15 NOTE — Assessment & Plan Note (Signed)
BP stable.  Continue current medication regimen.

## 2010-06-15 NOTE — Patient Instructions (Addendum)
Please call our office if your symptoms do not improve or gets worse.  

## 2010-06-15 NOTE — Assessment & Plan Note (Signed)
77 y/o smoker with mild bronchitis Pt likely has underlying COPD tx current exac with doxy and prednisone taper  When she returns in 2 months - we discussed obtaining spirometry

## 2010-06-18 ENCOUNTER — Ambulatory Visit: Payer: Self-pay | Admitting: Internal Medicine

## 2010-07-16 ENCOUNTER — Telehealth: Payer: Self-pay | Admitting: Internal Medicine

## 2010-07-16 DIAGNOSIS — I10 Essential (primary) hypertension: Secondary | ICD-10-CM

## 2010-07-16 MED ORDER — AMLODIPINE BESYLATE 2.5 MG PO TABS: 2.5000 mg | ORAL_TABLET | Freq: Every day | ORAL | Status: DC

## 2010-07-16 MED ORDER — METOPROLOL SUCCINATE ER 25 MG PO TB24: 25.0000 mg | ORAL_TABLET | Freq: Every day | ORAL | Status: DC

## 2010-07-16 NOTE — Telephone Encounter (Signed)
[facility] [phone], spoke with pharmacist Cherylann Ratel. She was asked if patient has Rx on file from March for the requested refills. She states she did not see one. A verbal order was provided for refill on Metoprolol and Amlodipine

## 2010-07-16 NOTE — Telephone Encounter (Signed)
Request for refill or new prescription  Metoprolol succ er 25mg  tab. Take 1 tablet every day. Qty 30.0 ea. Last fill 3.23.12  Amlodipine besylate 2.5mg  tab. Take 1 tablet every day. Qty 30.0 ea. Last fill 3.23.12

## 2010-08-05 ENCOUNTER — Encounter: Payer: Self-pay | Admitting: Internal Medicine

## 2010-08-06 ENCOUNTER — Ambulatory Visit: Payer: Medicare Other | Admitting: Internal Medicine

## 2010-08-13 ENCOUNTER — Encounter: Payer: Self-pay | Admitting: Internal Medicine

## 2010-08-13 ENCOUNTER — Ambulatory Visit (INDEPENDENT_AMBULATORY_CARE_PROVIDER_SITE_OTHER): Payer: Medicare Other | Admitting: Internal Medicine

## 2010-08-13 VITALS — BP 126/70 | HR 75 | Temp 97.9°F | Resp 18 | Wt 111.0 lb

## 2010-08-13 DIAGNOSIS — M25571 Pain in right ankle and joints of right foot: Secondary | ICD-10-CM | POA: Insufficient documentation

## 2010-08-13 DIAGNOSIS — M25579 Pain in unspecified ankle and joints of unspecified foot: Secondary | ICD-10-CM

## 2010-08-13 NOTE — Progress Notes (Signed)
  Subjective:    Patient ID: Beth [name], female    DOB: 1923/06/12, 77 y.o.   MRN: [id number]  HPI    Review of Systems     Past Medical History  Diagnosis Date  . Hypertension   . Lung nodule     CT of chest 2007-left loewr lobar mass- evaluated by pulmonary- patient refused further work up  . Tobacco abuse   . Vertigo     History   Social History  . Marital Status: Widowed    Spouse Name: N/A    Number of Children: N/A  . Years of Education: N/A   Occupational History  . Not on file.   Social History Main Topics  . Smoking status: Current Everyday Smoker -- 0.2 packs/day for 60 years  . Smokeless tobacco: Not on file  . Alcohol Use: No  . Drug Use:   . Sexually Active:    Other Topics Concern  . Not on file   Social History Narrative  . No narrative on file    No past surgical history on file.  Family History  Problem Relation Age of Onset  . Heart disease Mother   . Hypertension Mother   . Diabetes    . Cancer      lung- in distant releatives    Allergies  Allergen Reactions  . Ciprofloxacin     myalgias  . Fexofenadine   . Penicillins     Current Outpatient Prescriptions on File Prior to Visit  Medication Sig Dispense Refill  . amLODipine (NORVASC) 2.5 MG tablet Take 1 tablet (2.5 mg total) by mouth daily.  30 tablet  5  . calcium-vitamin D (OSCAL WITH D 500-200) 500-200 MG-UNIT per tablet Take 1 tablet by mouth.        . cetirizine (ZYRTEC) 10 MG tablet Take 10 mg by mouth daily as needed.        . Fluticasone-Salmeterol (ADVAIR DISKUS) 250-50 MCG/DOSE AEPB Inhale 1 puff into the lungs 2 (two) times daily.  1 each  5  . metoprolol succinate (TOPROL-XL) 25 MG 24 hr tablet Take 1 tablet (25 mg total) by mouth daily.  30 tablet  5  . Multiple Vitamin (MULTIVITAMIN) tablet Take 1 tablet by mouth daily.        . Saline (SODIUM CHLORIDE) 0.65 % SOLN by Nasal route. 2 sprays each nostril once daily as needed       . predniSONE (DELTASONE) 10  MG tablet pack 4 tabs once daily x 3 days, 3 tabs once daily x 3 days, 2 tabs once daily x 3 days, then 1 tab once daily x 3 days  30 tablet  0    BP 126/70  Pulse 75  Temp(Src) 97.9 F (36.6 C) (Oral)  Resp 18  Wt 111 lb (50.349 kg)  SpO2 96%    Objective:   Physical Exam     Msk:  Right ankle swelling.  Mild tenderness.  No redness. Neuro:  CN II - XII intact.  Negative cerebellar signs.  Negative romberg     Assessment & Plan:

## 2010-08-13 NOTE — Patient Instructions (Signed)
Our office will contact you re:  Orthopedic referral to Dr. Victorino Dike

## 2010-10-15 ENCOUNTER — Encounter: Payer: Self-pay | Admitting: Internal Medicine

## 2010-10-15 ENCOUNTER — Ambulatory Visit: Payer: Medicare Other | Admitting: Internal Medicine

## 2010-10-15 ENCOUNTER — Ambulatory Visit (INDEPENDENT_AMBULATORY_CARE_PROVIDER_SITE_OTHER): Payer: Medicare Other | Admitting: Internal Medicine

## 2010-10-15 VITALS — BP 142/80 | Temp 98.1°F | Wt 113.0 lb

## 2010-10-15 DIAGNOSIS — L659 Nonscarring hair loss, unspecified: Secondary | ICD-10-CM

## 2010-10-15 DIAGNOSIS — M810 Age-related osteoporosis without current pathological fracture: Secondary | ICD-10-CM

## 2010-10-15 DIAGNOSIS — I10 Essential (primary) hypertension: Secondary | ICD-10-CM

## 2010-10-15 DIAGNOSIS — E559 Vitamin D deficiency, unspecified: Secondary | ICD-10-CM

## 2010-10-15 LAB — CBC WITH DIFFERENTIAL/PLATELET
Basophils Absolute: 0.1 10*3/uL (ref 0.0–0.1)
Basophils Relative: 1 % (ref 0–1)
Eosinophils Absolute: 0.1 10*3/uL (ref 0.0–0.7)
Eosinophils Relative: 2 % (ref 0–5)
HCT: 41 % (ref 36.0–46.0)
MCH: 30.2 pg (ref 26.0–34.0)
MCHC: 33.9 g/dL (ref 30.0–36.0)
MCV: 88.9 fL (ref 78.0–100.0)
Monocytes Absolute: 0.4 10*3/uL (ref 0.1–1.0)
Platelets: 285 10*3/uL (ref 150–400)
RDW: 13.5 % (ref 11.5–15.5)
WBC: 5.7 10*3/uL (ref 4.0–10.5)

## 2010-10-15 LAB — BASIC METABOLIC PANEL
BUN: 9 mg/dL (ref 6–23)
Calcium: 10 mg/dL (ref 8.4–10.5)
Chloride: 103 mEq/L (ref 96–112)
Creat: 0.66 mg/dL (ref 0.50–1.10)

## 2010-10-15 MED ORDER — AMLODIPINE BESYLATE 2.5 MG PO TABS: 2.5000 mg | ORAL_TABLET | Freq: Every day | ORAL | Status: DC

## 2010-10-15 MED ORDER — METOPROLOL SUCCINATE ER 25 MG PO TB24: 25.0000 mg | ORAL_TABLET | Freq: Every day | ORAL | Status: DC

## 2010-10-16 DIAGNOSIS — L659 Nonscarring hair loss, unspecified: Secondary | ICD-10-CM | POA: Insufficient documentation

## 2010-10-16 LAB — VITAMIN D 25 HYDROXY (VIT D DEFICIENCY, FRACTURES): Vit D, 25-Hydroxy: 24 ng/mL — ABNORMAL LOW (ref 30–89)

## 2010-10-16 NOTE — Assessment & Plan Note (Signed)
Obtain a vitamin D level

## 2010-10-16 NOTE — Assessment & Plan Note (Signed)
Obtain TSH °

## 2010-10-16 NOTE — Assessment & Plan Note (Signed)
Mildly suboptimal. Continue current regimen and maintain outpatient blood pressure log for review

## 2010-10-16 NOTE — Progress Notes (Signed)
  Subjective:    Patient ID: ANITHA KREISER, female    DOB: 06-08-1923, 77 y.o.   MRN: [id number]  HPI Patient presents to clinic for evaluation of multiple medical problems. History of hypertension the blood pressure minimally elevated. Recheck to be 136/70. Compliant with medication without adverse effect.specifically denies lower extremity swelling. Does complain of mild alopecia without focal scarring. Weight up 2 pounds since last visit. Chart review indicates history of vitamin D deficiency. No other complaints.  Reviewed past medical history, medications and allergies    Review of Systems see history of present illness     Objective:   Physical Exam    Physical Exam  Vitals reviewed. Constitutional:  appears well-developed and well-nourished. No distress.  HENT:  Head: Normocephalic and atraumatic.  Nose: Nose normal.  Mouth/Throat: Oropharynx is clear and moist. No oropharyngeal exudate.  Eyes: Conjunctivae and EOM are normal. Pupils are equal, round, and reactive to light. Right eye exhibits no discharge. Left eye exhibits no discharge. No scleral icterus.  Neck: Neck supple. No thyromegaly present.  Cardiovascular: Normal rate, regular rhythm and normal heart sounds.  Exam reveals no gallop and no friction rub.   No murmur heard. Pulmonary/Chest: Effort normal and breath sounds normal. No respiratory distress.  has no wheezes.  has no rales.  Lymphadenopathy:   no cervical adenopathy.  Neurological:  is alert.  Skin: Skin is warm and dry.  not diaphoretic.  Psychiatric: normal mood and affect.      Assessment & Plan:

## 2011-02-11 ENCOUNTER — Ambulatory Visit (INDEPENDENT_AMBULATORY_CARE_PROVIDER_SITE_OTHER): Payer: Medicare Other | Admitting: Internal Medicine

## 2011-02-11 ENCOUNTER — Encounter: Payer: Self-pay | Admitting: Internal Medicine

## 2011-02-11 DIAGNOSIS — J310 Chronic rhinitis: Secondary | ICD-10-CM

## 2011-02-11 DIAGNOSIS — J984 Other disorders of lung: Secondary | ICD-10-CM

## 2011-02-11 DIAGNOSIS — R5383 Other fatigue: Secondary | ICD-10-CM

## 2011-02-11 DIAGNOSIS — E559 Vitamin D deficiency, unspecified: Secondary | ICD-10-CM

## 2011-02-11 DIAGNOSIS — M810 Age-related osteoporosis without current pathological fracture: Secondary | ICD-10-CM

## 2011-02-11 NOTE — Assessment & Plan Note (Signed)
Attempt nasonex sample 2 sprays each nostril qhs.

## 2011-02-11 NOTE — Progress Notes (Signed)
  Subjective:    Patient ID: Beth [name], female    DOB: 10-Nov-1923, 77 y.o.   MRN: [id number]  HPI Pt presents to clinic for followup of multiple medical problems. Notes fatigue and cold hands intermittently. Wonders if related to medication. H/o vit d deficiency in the setting of osteoporosis. Last bmd approximately 2 years ago. No recent fx. Previously took actonel but stopped due to gi side effects. Reviewed h/o lung mass previously evaluated with ct. Pt aware of dx and declines further evaluation even if dx was malignant. Bp reviewed normotensive. C/o having to clear throat and nasal drainage no longer improved with zyrtec. No other complaints.  Past Medical History  Diagnosis Date  . Hypertension   . Lung nodule     CT of chest 2007-left loewr lobar mass- evaluated by pulmonary- patient refused further work up  . Tobacco abuse   . Vertigo    No past surgical history on file.  reports that she has been smoking.  She has never used smokeless tobacco. She reports that she does not drink alcohol. Her drug history not on file. family history includes Cancer in an unspecified family member; Diabetes in an unspecified family member; Heart disease in her mother; and Hypertension in her mother. Allergies  Allergen Reactions  . Ciprofloxacin     myalgias  . Fexofenadine   . Penicillins      Review of Systems see hpi     Objective:   Physical Exam  Physical Exam  Nursing note and vitals reviewed. Constitutional: Appears well-developed and well-nourished. No distress.  HENT:  OP clear Head: Normocephalic and atraumatic.  Right Ear: External ear normal.  Left Ear: External ear normal.  Eyes: Conjunctivae are normal. No scleral icterus.  Neck: Neck supple. Carotid bruit is not present. no adenopathy Cardiovascular: Normal rate, regular rhythm and normal heart sounds.  Exam reveals no gallop and no friction rub.   No murmur heard. Pulmonary/Chest: Effort normal and breath sounds  normal. No respiratory distress. He has no wheezes. no rales.  Lymphadenopathy:    He has no cervical adenopathy.  Neurological:Alert.  Skin: Skin is warm and dry. Not diaphoretic.  Psychiatric: Has a normal mood and affect.        Assessment & Plan:

## 2011-02-11 NOTE — Patient Instructions (Signed)
Please schedule your bone density.

## 2011-02-11 NOTE — Assessment & Plan Note (Signed)
Obtain vit d level 

## 2011-02-11 NOTE — Assessment & Plan Note (Signed)
Decrease toprol to 1/2 tablet qd.

## 2011-02-11 NOTE — Assessment & Plan Note (Signed)
Declines further evalution

## 2011-02-11 NOTE — Assessment & Plan Note (Signed)
Schedule BMD. Intolerant of actonel

## 2011-02-12 LAB — VITAMIN D 25 HYDROXY (VIT D DEFICIENCY, FRACTURES): Vit D, 25-Hydroxy: 32 ng/mL (ref 30–89)

## 2011-02-25 ENCOUNTER — Ambulatory Visit (INDEPENDENT_AMBULATORY_CARE_PROVIDER_SITE_OTHER)
Admission: RE | Admit: 2011-02-25 | Discharge: 2011-02-25 | Disposition: A | Discharge status: Home / Self Care | Payer: Medicare Other | Source: Ambulatory Visit

## 2011-02-25 DIAGNOSIS — M810 Age-related osteoporosis without current pathological fracture: Secondary | ICD-10-CM

## 2011-03-01 ENCOUNTER — Encounter: Payer: Self-pay | Admitting: Internal Medicine

## 2011-03-07 ENCOUNTER — Telehealth: Payer: Self-pay | Admitting: Internal Medicine

## 2011-03-07 NOTE — Telephone Encounter (Signed)
Patient states that she has had severe pain in head/neck for the past few days. Also, with an earache. Patient states that she did not fall. I offered patient appt for today but declined, she is coming in tomorrow.

## 2011-03-08 ENCOUNTER — Ambulatory Visit (INDEPENDENT_AMBULATORY_CARE_PROVIDER_SITE_OTHER): Payer: Medicare Other | Admitting: Internal Medicine

## 2011-03-08 ENCOUNTER — Encounter: Payer: Self-pay | Admitting: Internal Medicine

## 2011-03-08 DIAGNOSIS — M2669 Other specified disorders of temporomandibular joint: Secondary | ICD-10-CM

## 2011-03-08 DIAGNOSIS — M26629 Arthralgia of temporomandibular joint, unspecified side: Secondary | ICD-10-CM

## 2011-03-08 DIAGNOSIS — M542 Cervicalgia: Secondary | ICD-10-CM

## 2011-03-08 NOTE — Patient Instructions (Signed)
Please use a heating pad on your neck 10-15 minutes at a time as needed. Also please try to take aleve twice a day for 5 days with food as long as it doesn't bother your stomach.

## 2011-03-08 NOTE — Progress Notes (Signed)
  Subjective:    Patient ID: Beth [name], female    DOB: 1923-11-26, 77 y.o.   MRN: [id number]  HPI Pt presents to clinic for followup of multiple medical problems. Notes recent onset of right peri-auricular pain and tenderness. No injury to ear and no drainage/fever/chills. Also notes right lateral neck pain roughly in area of SCM. No neck stiffness, headache, tender temple or visual changes. Did note intermittent popping of right jaw without restricted ROM. No alleviating or exacerbating factors. No other complaints.   Past Medical History  Diagnosis Date  . Hypertension   . Lung nodule     CT of chest 2007-left loewr lobar mass- evaluated by pulmonary- patient refused further work up  . Tobacco abuse   . Vertigo    No past surgical history on file.  reports that she has been smoking.  She has never used smokeless tobacco. She reports that she does not drink alcohol. Her drug history not on file. family history includes Cancer in an unspecified family member; Diabetes in an unspecified family member; Heart disease in her mother; and Hypertension in her mother. Allergies  Allergen Reactions  . Ciprofloxacin     myalgias  . Fexofenadine   . Penicillins      Review of Systems see hpi     Objective:   Physical Exam  Nursing note and vitals reviewed. Constitutional: She appears well-developed and well-nourished. No distress.  HENT:  Head: Normocephalic and atraumatic.  Right Ear: Tympanic membrane, external ear and ear canal normal.  Left Ear: Tympanic membrane, external ear and ear canal normal.  Eyes: Conjunctivae are normal. No scleral icterus.  Neck: Neck supple.  Musculoskeletal:       Mild reproducible tenderness along right SCM. No overt spasm or decreased ROM. No right temporal artery tenderness or nodularity. FROM jaw without reproducible click/pop/hesitation.  Lymphadenopathy:    She has no cervical adenopathy.  Skin: Skin is warm and dry. No rash noted. She is  not diaphoretic.  Psychiatric: She has a normal mood and affect.          Assessment & Plan:

## 2011-03-08 NOTE — Assessment & Plan Note (Signed)
Appears c/w msk etiology. Recommend topical heat prn. Followup if no improvement or worsening.

## 2011-03-08 NOTE — Assessment & Plan Note (Signed)
Mild. Attempt short course of otc nsaid. Followup if no improvement or worsening.

## 2011-03-14 ENCOUNTER — Telehealth: Payer: Self-pay | Admitting: *Deleted

## 2011-03-14 MED ORDER — ALENDRONATE SODIUM 70 MG PO TABS: 70.0000 mg | ORAL_TABLET | ORAL | Status: DC

## 2011-03-14 NOTE — Telephone Encounter (Signed)
See lab note from imaging date 02/11/2011 for Dexa report.

## 2011-05-13 ENCOUNTER — Encounter: Payer: Self-pay | Admitting: Internal Medicine

## 2011-05-13 ENCOUNTER — Ambulatory Visit (INDEPENDENT_AMBULATORY_CARE_PROVIDER_SITE_OTHER): Payer: Medicare Other | Admitting: Internal Medicine

## 2011-05-13 DIAGNOSIS — E559 Vitamin D deficiency, unspecified: Secondary | ICD-10-CM

## 2011-05-13 DIAGNOSIS — L603 Nail dystrophy: Secondary | ICD-10-CM

## 2011-05-13 DIAGNOSIS — L608 Other nail disorders: Secondary | ICD-10-CM

## 2011-05-13 DIAGNOSIS — R5383 Other fatigue: Secondary | ICD-10-CM

## 2011-05-13 DIAGNOSIS — R5381 Other malaise: Secondary | ICD-10-CM | POA: Diagnosis not present

## 2011-05-13 LAB — BASIC METABOLIC PANEL
Chloride: 105 mEq/L (ref 96–112)
Potassium: 4.4 mEq/L (ref 3.5–5.3)
Sodium: 142 mEq/L (ref 135–145)

## 2011-05-13 LAB — CBC WITH DIFFERENTIAL/PLATELET
Basophils Absolute: 0.1 10*3/uL (ref 0.0–0.1)
Basophils Relative: 2 % — ABNORMAL HIGH (ref 0–1)
Hemoglobin: 13.8 g/dL (ref 12.0–15.0)
Lymphocytes Relative: 23 % (ref 12–46)
MCHC: 33.7 g/dL (ref 30.0–36.0)
Neutro Abs: 4.2 10*3/uL (ref 1.7–7.7)
Neutrophils Relative %: 67 % (ref 43–77)
RDW: 13.1 % (ref 11.5–15.5)
WBC: 6.2 10*3/uL (ref 4.0–10.5)

## 2011-05-13 LAB — HEPATIC FUNCTION PANEL
ALT: 9 U/L (ref 0–35)
AST: 23 U/L (ref 0–37)
Albumin: 4.7 g/dL (ref 3.5–5.2)
Alkaline Phosphatase: 66 U/L (ref 39–117)
Total Protein: 7.2 g/dL (ref 6.0–8.3)

## 2011-05-14 DIAGNOSIS — L603 Nail dystrophy: Secondary | ICD-10-CM | POA: Insufficient documentation

## 2011-05-14 LAB — VITAMIN D 25 HYDROXY (VIT D DEFICIENCY, FRACTURES): Vit D, 25-Hydroxy: 34 ng/mL (ref 30–89)

## 2011-05-14 NOTE — Progress Notes (Signed)
  Subjective:    Patient ID: Beth [name], female    DOB: 01/04/1924, 78 y.o.   MRN: [id number]  HPI Pt presents to clinic for followup of multiple medical problems. Has mild chronic fatigue without associated sx's. Wonders if related to medication. Admits to very mild depressive sx's but does not think it warrants medication. C/o dystrophic toe nails difficult to trim. No other complaints.  Past Medical History  Diagnosis Date  . Hypertension   . Lung nodule     CT of chest 2007-left loewr lobar mass- evaluated by pulmonary- patient refused further work up  . Tobacco abuse   . Vertigo    No past surgical history on file.  reports that she has been smoking.  She has never used smokeless tobacco. She reports that she does not drink alcohol. Her drug history not on file. family history includes Cancer in an unspecified family member; Diabetes in an unspecified family member; Heart disease in her mother; and Hypertension in her mother. Allergies  Allergen Reactions  . Ciprofloxacin     myalgias  . Fexofenadine   . Penicillins       Review of Systems see hpi     Objective:   Physical Exam  Physical Exam  Nursing note and vitals reviewed. Constitutional: Appears well-developed and well-nourished. No distress.  HENT:  Head: Normocephalic and atraumatic.  Right Ear: External ear normal.  Left Ear: External ear normal.  Eyes: Conjunctivae are normal. No scleral icterus.  Neck: Neck supple. Carotid bruit is not present.  Cardiovascular: Normal rate, regular rhythm and normal heart sounds.  Exam reveals no gallop and no friction rub.   No murmur heard. Pulmonary/Chest: Effort normal and breath sounds normal. No respiratory distress. He has no wheezes. no rales.  Lymphadenopathy:    He has no cervical adenopathy.  Neurological:Alert.  Skin: Skin is warm and dry. Not diaphoretic.  Psychiatric: Has a normal mood and affect.        Assessment & Plan:

## 2011-05-14 NOTE — Assessment & Plan Note (Signed)
Obtain vitamin d  

## 2011-05-14 NOTE — Assessment & Plan Note (Signed)
Podiatry referral

## 2011-05-14 NOTE — Assessment & Plan Note (Signed)
Obtain cbc, chem7, tsh and lft. Decrease toprol to 1/2 tablet qd. Followup if no improvement or worsening.

## 2011-05-30 DIAGNOSIS — B351 Tinea unguium: Secondary | ICD-10-CM | POA: Diagnosis not present

## 2011-05-30 DIAGNOSIS — M79609 Pain in unspecified limb: Secondary | ICD-10-CM | POA: Diagnosis not present

## 2011-07-05 ENCOUNTER — Encounter: Payer: Self-pay | Admitting: Internal Medicine

## 2011-07-05 ENCOUNTER — Ambulatory Visit (INDEPENDENT_AMBULATORY_CARE_PROVIDER_SITE_OTHER): Payer: Medicare Other | Admitting: Internal Medicine

## 2011-07-05 VITALS — BP 100/60 | HR 72 | Temp 97.9°F | Resp 18 | Wt 115.0 lb

## 2011-07-05 DIAGNOSIS — R109 Unspecified abdominal pain: Secondary | ICD-10-CM | POA: Diagnosis not present

## 2011-07-05 MED ORDER — SULFAMETHOXAZOLE-TRIMETHOPRIM 800-160 MG PO TABS: 1.0000 | ORAL_TABLET | Freq: Two times a day (BID) | ORAL | Status: DC

## 2011-07-05 MED ORDER — METRONIDAZOLE 500 MG PO TABS: 500.0000 mg | ORAL_TABLET | Freq: Three times a day (TID) | ORAL | Status: DC

## 2011-07-05 NOTE — Assessment & Plan Note (Signed)
Obtain cbc, amylase, lipase and lft. Begin dual abx for possible diverticulitis. Schedule close f/u.

## 2011-07-05 NOTE — Progress Notes (Signed)
  Subjective:    Patient ID: Beth [name], female    DOB: 01/24/1924, 78 y.o.   MRN: [id number]  HPI Pt presents to clinic for evaluation of abdominal pain. Duration of sx's ~10d. Noted one day of diarrhea without blood now resolved. Location of pain is LLQ. Notes h/o diverticulitis. No f/c. Taking no medication for the problem. No alleviating or exacerbating factors. No other complaints.  Past Medical History  Diagnosis Date  . Hypertension   . Lung nodule     CT of chest 2007-left loewr lobar mass- evaluated by pulmonary- patient refused further work up  . Tobacco abuse   . Vertigo    No past surgical history on file.  reports that she has been smoking.  She has never used smokeless tobacco. She reports that she does not drink alcohol. Her drug history not on file. family history includes Cancer in an unspecified family member; Diabetes in an unspecified family member; Heart disease in her mother; and Hypertension in her mother. Allergies  Allergen Reactions  . Ciprofloxacin     myalgias  . Fexofenadine   . Penicillins      Review of Systems see hpi     Objective:   Physical Exam  Nursing note and vitals reviewed. Constitutional: She appears well-developed and well-nourished. No distress.  HENT:  Head: Normocephalic and atraumatic.  Abdominal: Soft. Normal appearance and bowel sounds are normal. She exhibits no distension and no mass. There is no hepatosplenomegaly. There is tenderness in the left lower quadrant. There is no rigidity, no rebound and no guarding.  Skin: She is not diaphoretic.          Assessment & Plan:

## 2011-07-06 LAB — CBC WITH DIFFERENTIAL/PLATELET
Basophils Relative: 1 % (ref 0–1)
Eosinophils Absolute: 0.1 10*3/uL (ref 0.0–0.7)
Eosinophils Relative: 1 % (ref 0–5)
MCH: 30.5 pg (ref 26.0–34.0)
MCHC: 33.4 g/dL (ref 30.0–36.0)
MCV: 91.1 fL (ref 78.0–100.0)
Neutrophils Relative %: 67 % (ref 43–77)
Platelets: 280 10*3/uL (ref 150–400)
RDW: 13.3 % (ref 11.5–15.5)

## 2011-07-06 LAB — HEPATIC FUNCTION PANEL
Albumin: 4.2 g/dL (ref 3.5–5.2)
Total Bilirubin: 0.5 mg/dL (ref 0.3–1.2)
Total Protein: 6.7 g/dL (ref 6.0–8.3)

## 2011-07-06 LAB — AMYLASE: Amylase: 36 U/L (ref 0–105)

## 2011-07-15 ENCOUNTER — Ambulatory Visit (INDEPENDENT_AMBULATORY_CARE_PROVIDER_SITE_OTHER): Payer: Medicare Other | Admitting: Internal Medicine

## 2011-07-15 ENCOUNTER — Encounter: Payer: Self-pay | Admitting: Internal Medicine

## 2011-07-15 VITALS — BP 100/70 | HR 72 | Temp 98.2°F

## 2011-07-15 DIAGNOSIS — I10 Essential (primary) hypertension: Secondary | ICD-10-CM | POA: Diagnosis not present

## 2011-07-15 DIAGNOSIS — R42 Dizziness and giddiness: Secondary | ICD-10-CM | POA: Diagnosis not present

## 2011-07-15 DIAGNOSIS — R109 Unspecified abdominal pain: Secondary | ICD-10-CM

## 2011-07-15 MED ORDER — MECLIZINE HCL 25 MG PO TABS: 12.5000 mg | ORAL_TABLET | Freq: Three times a day (TID) | ORAL | Status: DC | PRN

## 2011-07-15 NOTE — Assessment & Plan Note (Signed)
Neurologically nonfocal. Suspect possible vestibular etiology. Attempt low dose meclizine prn-cautioned re possible sedating effect. Followup if no improvement or worsening.

## 2011-07-15 NOTE — Progress Notes (Signed)
  Subjective:    Patient ID: Beth [name], female    DOB: 1923/05/26, 78 y.o.   MRN: [id number]  HPI Pt presents to clinic for followup of multiple medical problems. Seen last week with abd pain suspected to be from diverticulitis. Completed abx course yesterday with resolution of sx's. Now notes few day h/o dizziness without vertigo, n/v, presyncope, syncope, recent illness or neurologic/stroke sx's. Wondering if inner ear related and has had similar episodes in the past. Taking no medication for the problem. Not worsened by position change or head turning. Spontaneously improving. BP reviewed low nl. No other complaints.  Past Medical History  Diagnosis Date  . Hypertension   . Lung nodule     CT of chest 2007-left loewr lobar mass- evaluated by pulmonary- patient refused further work up  . Tobacco abuse   . Vertigo    No past surgical history on file.  reports that she has been smoking.  She has never used smokeless tobacco. She reports that she does not drink alcohol. Her drug history not on file. family history includes Cancer in an unspecified family member; Diabetes in an unspecified family member; Heart disease in her mother; and Hypertension in her mother. Allergies  Allergen Reactions  . Ciprofloxacin     myalgias  . Fexofenadine   . Penicillins       Review of Systems see hpi     Objective:   Physical Exam  Nursing note and vitals reviewed. Constitutional: She appears well-developed and well-nourished. No distress.  HENT:  Head: Normocephalic and atraumatic.  Right Ear: Tympanic membrane, external ear and ear canal normal.  Left Ear: Tympanic membrane, external ear and ear canal normal.  Eyes: Conjunctivae and EOM are normal. Pupils are equal, round, and reactive to light. No scleral icterus.  Cardiovascular: Regular rhythm and normal heart sounds.   Neurological: She is alert. No cranial nerve deficit. Coordination normal.  Skin: Skin is warm and dry. She is  not diaphoretic.  Psychiatric: She has a normal mood and affect.          Assessment & Plan:

## 2011-07-15 NOTE — Assessment & Plan Note (Signed)
Resolved with abx tx. Suspected diverticulitis

## 2011-07-15 NOTE — Assessment & Plan Note (Signed)
Low nl. Possible contribution to dizziness. Hold norvasc and monitor bp

## 2011-08-12 ENCOUNTER — Telehealth: Payer: Self-pay | Admitting: Internal Medicine

## 2011-08-12 ENCOUNTER — Ambulatory Visit (INDEPENDENT_AMBULATORY_CARE_PROVIDER_SITE_OTHER): Payer: Medicare Other | Admitting: Internal Medicine

## 2011-08-12 ENCOUNTER — Encounter: Payer: Self-pay | Admitting: Internal Medicine

## 2011-08-12 DIAGNOSIS — R079 Chest pain, unspecified: Secondary | ICD-10-CM

## 2011-08-12 DIAGNOSIS — I10 Essential (primary) hypertension: Secondary | ICD-10-CM

## 2011-08-12 DIAGNOSIS — M810 Age-related osteoporosis without current pathological fracture: Secondary | ICD-10-CM

## 2011-08-12 MED ORDER — NITROGLYCERIN 0.4 MG SL SUBL: 0.4000 mg | SUBLINGUAL_TABLET | SUBLINGUAL | Status: DC | PRN

## 2011-08-12 NOTE — Telephone Encounter (Signed)
Lab order entered for July 2013. 

## 2011-08-12 NOTE — Progress Notes (Signed)
  Subjective:    Patient ID: Beth [name], female    DOB: 04-26-1923, 78 y.o.   MRN: [id number]  HPI Pt presents to clinic for followup of multiple medical problems. Notes intermittent left sided chest pressure with left arm radiation. Sx's occur often with activity and resolved with rest. Last occurrence was last night. No current sx's or complaints. No associated dyspnea or diaphoresis.  Past Medical History  Diagnosis Date  . Hypertension   . Lung nodule     CT of chest 2007-left loewr lobar mass- evaluated by pulmonary- patient refused further work up  . Tobacco abuse   . Vertigo    No past surgical history on file.  reports that she has been smoking.  She has never used smokeless tobacco. She reports that she does not drink alcohol. Her drug history not on file. family history includes Cancer in an unspecified family member; Diabetes in an unspecified family member; Heart disease in her mother; and Hypertension in her mother. Allergies  Allergen Reactions  . Ciprofloxacin     myalgias  . Fexofenadine   . Penicillins       Review of Systems see hpi     Objective:   Physical Exam  Physical Exam  Nursing note and vitals reviewed. Constitutional: Appears well-developed and well-nourished. No distress.  HENT:  Head: Normocephalic and atraumatic.  Right Ear: External ear normal.  Left Ear: External ear normal.  Eyes: Conjunctivae are normal. No scleral icterus.  Neck: Neck supple. Carotid bruit is not present.  Cardiovascular: Normal rate, regular rhythm and normal heart sounds.  Exam reveals no gallop and no friction rub.   No murmur heard. Pulmonary/Chest: Effort normal and breath sounds normal. No respiratory distress. He has no wheezes. no rales.  Lymphadenopathy:    He has no cervical adenopathy.  Neurological:Alert.  Skin: Skin is warm and dry. Not diaphoretic.  Psychiatric: Has a normal mood and affect.        Assessment & Plan:

## 2011-08-12 NOTE — Patient Instructions (Signed)
Please schedule fasting labs prior to next visit Chem7-oteoporosis, lipid-401.9

## 2011-08-14 DIAGNOSIS — R079 Chest pain, unspecified: Secondary | ICD-10-CM | POA: Insufficient documentation

## 2011-08-14 NOTE — Assessment & Plan Note (Signed)
Currently asx. ekg obtained demonstrates sinus rhythm with nl intervals and axis. Nonspecific t wave abnormalities. Given ntg sl prn cp. Proceed with cardiology consult. Present to ED with any further sx's. States understanding and agreement.

## 2011-08-16 ENCOUNTER — Encounter: Payer: Self-pay | Admitting: Cardiovascular Disease

## 2011-08-16 ENCOUNTER — Ambulatory Visit (INDEPENDENT_AMBULATORY_CARE_PROVIDER_SITE_OTHER): Payer: Medicare Other | Admitting: Cardiovascular Disease

## 2011-08-16 VITALS — BP 176/78 | HR 74 | Ht 60.0 in | Wt 116.0 lb

## 2011-08-16 DIAGNOSIS — R9431 Abnormal electrocardiogram [ECG] [EKG]: Secondary | ICD-10-CM | POA: Diagnosis not present

## 2011-08-16 DIAGNOSIS — R079 Chest pain, unspecified: Secondary | ICD-10-CM | POA: Diagnosis not present

## 2011-08-16 DIAGNOSIS — I10 Essential (primary) hypertension: Secondary | ICD-10-CM

## 2011-08-16 MED ORDER — LISINOPRIL 10 MG PO TABS: 10.0000 mg | ORAL_TABLET | Freq: Every day | ORAL | Status: DC

## 2011-08-16 NOTE — Assessment & Plan Note (Signed)
Her blood pressure is moderately elevated today. She was previously on amlodipine but it was causing hypotension. We'll try her on lisinopril 10 mg a day. I'll see her in one month for an office visit and basic metabolic profile.

## 2011-08-16 NOTE — Progress Notes (Signed)
Beth [name] Date of Birth  Jul 02, 1923       [facility]    [place] [address]  [address] [place], [place]  [address]   [place], [place]  [address] [phone]     [phone]   Fax  [phone]    Fax [phone]  Problem List: 1. Chest pain 2.   History of Present Illness:  Pt presents with a history of chest pain. She's had these chest pains for the past 10 days or so.  She tends to have them at various times.  They are occasionally associated with reaching up into the cabinet. They're not associated with exertion. There is  no pleuritic component.  They are not associated with eating or drinking.  The pains last for a second or two.  They pains are sometimes sharp and sometimes heavy.    She has had a cardiac cath long ago while in [place].  She was found to have some minor irregularities but no need for a stent.  Current Outpatient Prescriptions on File Prior to Visit  Medication Sig Dispense Refill  . alendronate (FOSAMAX) 70 MG tablet Take 1 tablet (70 mg total) by mouth every 7 (seven) days. Take with a full glass of water on an empty stomach.  12 tablet  3  . calcium-vitamin D (OSCAL WITH D 500-200) 500-200 MG-UNIT per tablet Take 1 tablet by mouth.        . cetirizine (ZYRTEC) 10 MG tablet Take 10 mg by mouth daily as needed.        . meclizine (ANTIVERT) 25 MG tablet Take 0.5 tablets (12.5 mg total) by mouth 3 (three) times daily as needed for dizziness.  30 tablet  1  . metoprolol succinate (TOPROL-XL) 25 MG 24 hr tablet Take 1 tablet (25 mg total) by mouth daily.  90 tablet  3  . Multiple Vitamin (MULTIVITAMIN) tablet Take 1 tablet by mouth daily.        . nitroGLYCERIN (NITROSTAT) 0.4 MG SL tablet Place 1 tablet (0.4 mg total) under the tongue every 5 (five) minutes as needed for chest pain.  10 tablet  1  . Saline (SODIUM CHLORIDE) 0.65 % SOLN by Nasal route. 2 sprays each nostril once daily as needed          Allergies  Allergen Reactions  . Ciprofloxacin     myalgias  . Fexofenadine   . Penicillins     Past Medical History  Diagnosis Date  . Hypertension   . Lung nodule     CT of chest 2007-left loewr lobar mass- evaluated by pulmonary- patient refused further work up  . Tobacco abuse   . Vertigo     No past surgical history on file.  History  Smoking status  . Current Everyday Smoker -- 0.2 packs/day for 60 years  Smokeless tobacco  . Never Used    History  Alcohol Use No    Family History  Problem Relation Age of Onset  . Heart disease Mother   . Hypertension Mother   . Diabetes    . Cancer      lung- in distant releatives    Reviw of Systems:  Reviewed in the HPI.  All other systems are negative.  Physical Exam: Blood pressure 176/78, pulse 74, height 5' (1.524 m), weight 116 lb (52.617 kg). General: Well developed, well nourished, in no acute distress.  Head: Normocephalic, atraumatic, sclera non-icteric, mucus membranes  are moist,   Neck: Supple. Carotids are 2 + without bruits. No JVD  Lungs: Clear bilaterally to auscultation.  Heart: regular rate.  normal  S1 S2. There is a mid systolic click and a soft systolic murmur.  Abdomen: Soft, non-tender, non-distended with normal bowel sounds. No hepatomegaly. No rebound/guarding. No masses.  Msk:  Strength and tone are normal  Extremities: No clubbing or cyanosis. No edema.  Distal pedal pulses are 2+ and equal bilaterally.  Neuro: Alert and oriented X 3. Moves all extremities spontaneously.  Psych:  Responds to questions appropriately with a normal affect.  ECG: From her medical doctor- Sinus  Rhythm at 66 bmp. -Left axis -anterior fascicular block.   -Old anteroseptal infarct.   -  Nonspecific T-abnormality.  Assessment / Plan:

## 2011-08-16 NOTE — Patient Instructions (Signed)
Your physician has requested that you have a lexiscan myoview.  Please follow instruction sheet, as given.   Your physician has requested that you have an echocardiogram. Echocardiography is a painless test that uses sound waves to create images of your heart. It provides your doctor with information about the size and shape of your heart and how well your heart's chambers and valves are working. This procedure takes approximately one hour. There are no restrictions for this procedure.   Your physician recommends that you schedule a follow-up appointment in: 1 month  Your physician recommends that you return for lab work in: 1 month // bmet not fasting   Your physician has recommended you make the following change in your medication:   Start lisinopril 10 mg daily

## 2011-08-16 NOTE — Assessment & Plan Note (Signed)
She presents with some episodes of chest pain. On exam she has findings consistent with mitral valve prolapse. She does have an abnormal EKG which suggests a previous anterior wall myocardial infarction. We will schedule her for a Lexiscan Myoview study for further evaluation.

## 2011-08-23 ENCOUNTER — Other Ambulatory Visit: Payer: Medicare Other

## 2011-08-24 ENCOUNTER — Institutional Professional Consult (permissible substitution): Payer: Medicare Other | Admitting: Cardiology

## 2011-08-26 ENCOUNTER — Ambulatory Visit (HOSPITAL_COMMUNITY): Payer: Medicare Other | Attending: Cardiology

## 2011-08-26 ENCOUNTER — Other Ambulatory Visit: Payer: Self-pay

## 2011-08-26 DIAGNOSIS — R9431 Abnormal electrocardiogram [ECG] [EKG]: Secondary | ICD-10-CM

## 2011-08-26 DIAGNOSIS — F17200 Nicotine dependence, unspecified, uncomplicated: Secondary | ICD-10-CM | POA: Insufficient documentation

## 2011-08-26 DIAGNOSIS — I10 Essential (primary) hypertension: Secondary | ICD-10-CM

## 2011-08-26 DIAGNOSIS — I359 Nonrheumatic aortic valve disorder, unspecified: Secondary | ICD-10-CM | POA: Diagnosis not present

## 2011-08-26 DIAGNOSIS — R072 Precordial pain: Secondary | ICD-10-CM

## 2011-08-29 ENCOUNTER — Ambulatory Visit (HOSPITAL_COMMUNITY): Payer: Medicare Other | Attending: Cardiovascular Disease | Admitting: Radiology

## 2011-08-29 VITALS — BP 145/76 | Ht 60.0 in | Wt 113.0 lb

## 2011-08-29 DIAGNOSIS — R079 Chest pain, unspecified: Secondary | ICD-10-CM | POA: Insufficient documentation

## 2011-08-29 DIAGNOSIS — M79609 Pain in unspecified limb: Secondary | ICD-10-CM | POA: Diagnosis not present

## 2011-08-29 DIAGNOSIS — F17200 Nicotine dependence, unspecified, uncomplicated: Secondary | ICD-10-CM | POA: Insufficient documentation

## 2011-08-29 DIAGNOSIS — I10 Essential (primary) hypertension: Secondary | ICD-10-CM | POA: Insufficient documentation

## 2011-08-29 DIAGNOSIS — R61 Generalized hyperhidrosis: Secondary | ICD-10-CM | POA: Diagnosis not present

## 2011-08-29 DIAGNOSIS — R9431 Abnormal electrocardiogram [ECG] [EKG]: Secondary | ICD-10-CM | POA: Diagnosis not present

## 2011-08-29 DIAGNOSIS — R42 Dizziness and giddiness: Secondary | ICD-10-CM | POA: Insufficient documentation

## 2011-08-29 DIAGNOSIS — R0989 Other specified symptoms and signs involving the circulatory and respiratory systems: Secondary | ICD-10-CM | POA: Insufficient documentation

## 2011-08-29 DIAGNOSIS — R0609 Other forms of dyspnea: Secondary | ICD-10-CM | POA: Insufficient documentation

## 2011-08-29 DIAGNOSIS — M25519 Pain in unspecified shoulder: Secondary | ICD-10-CM | POA: Insufficient documentation

## 2011-08-29 DIAGNOSIS — J449 Chronic obstructive pulmonary disease, unspecified: Secondary | ICD-10-CM | POA: Diagnosis not present

## 2011-08-29 DIAGNOSIS — J4489 Other specified chronic obstructive pulmonary disease: Secondary | ICD-10-CM | POA: Insufficient documentation

## 2011-08-29 NOTE — Progress Notes (Signed)
[facility] SITE 3 NUCLEAR MED [address] [place] [address] [phone]  Cardiology Nuclear Med Study  Beth [name] is a 78 y.o. female     MRN : [id number]     DOB: 08-Jan-1924  Procedure Date: 08/29/2011  Nuclear Med Background Indication for Stress Test:  Evaluation for Ischemia and Abnormal EKG History:  COPD and Heart Catheterization in [place] with minor irregularities Cardiac Risk Factors: Hypertension and Smoker  Symptoms:  Chest Pain and Left arm/shoulder pain with last episode today and chest pressure, Diaphoresis, Dizziness and DOE   Nuclear Pre-Procedure Caffeine/Decaff Intake:  None NPO After: 7:00pm   Lungs:  clear O2 Sat: 96% on RA IV 0.9% NS with Angio Cath:  22g  IV Site: R Antecubital  IV Started by:  Stanton Kidney, EMT-P  Chest Size (in):  34 Cup Size: B  Height: 5' (1.524 m)  Weight:  113 lb (51.256 kg)  BMI:  Body mass index is 22.07 kg/(m^2). Tech Comments:  Toprol held > 24 hours, per patient.    Nuclear Med Study 1 or 2 day study: 1 day  Stress Test Type:  Lexiscan  Reading MD: Willa Rough, MD  Order Authorizing Provider:  Jannette Spanner  Resting Radionuclide: Technetium 81m Tetrofosmin  Resting Radionuclide Dose: 10.8 mCi   Stress Radionuclide:  Technetium 60m Tetrofosmin  Stress Radionuclide Dose: 33.0 mCi           Stress Protocol Rest HR: 67 Stress HR: 113  Rest BP: 145/76 Stress BP: 163/67  Exercise Time (min): n/a METS: n/a   Predicted Max HR: 135 bpm % Max HR: 83.7 bpm Rate Pressure Product: 21308   Dose of Adenosine (mg):  n/a Dose of Lexiscan: 0.4 mg  Dose of Atropine (mg): n/a Dose of Dobutamine: n/a mcg/kg/min (at max HR)  Stress Test Technologist: Cathlyn Parsons, RN  Nuclear Technologist:  Domenic Polite, CNMT     Rest Procedure:  Myocardial perfusion imaging was performed at rest 45 minutes following the intravenous administration of Technetium 34m Tetrofosmin. Rest ECG: Sinus Rhythm with  ILBB  Stress Procedure:  The patient received IV Lexiscan 0.4 mg over 15-seconds.  Technetium 66m Tetrofosmin injected at 30-seconds.  There were no significant changes with Lexiscan. Patient had chest pressure and tightness with infusion. Patient had occasional PVC's.  Quantitative spect images were obtained after a 45 minute delay. Stress ECG: There are diffuse nonspecific ST-T wave changes at rest. There is no significant change with stress.  QPS Raw Data Images:  Normal; no motion artifact; normal heart/lung ratio. Stress Images:  There is a mild variation in the shape of the ventricle relative to the standard images obtained. Because of this the quantitative analysis raises the question of mild decreased activity in the mid and apical anteroseptal segments. I believe this is not a real finding. Rest Images:  The rest images have the same appearance as the stress images. Subtraction (SDS):  No evidence of ischemia. Transient Ischemic Dilatation (Normal <1.22):  1.04 Lung/Heart Ratio (Normal <0.45):  0.22  Quantitative Gated Spect Images QGS EDV:  63 ml QGS ESV:  22 ml  Impression Exercise Capacity:  Lexiscan with no exercise. BP Response:  Normal blood pressure response. Clinical Symptoms:  the patient had shortness of breath and some chest pressure. This improved in recovery with no medications. ECG Impression:  No significant ST segment change suggestive of ischemia. Comparison with Prior Nuclear Study: No images to compare  Overall Impression:  Normal stress  nuclear study. There is no definite evidence of scar or ischemia. Wall motion is normal.  LV Ejection Fraction: 65%.  LV Wall Motion:  Normal Wall Motion  Willa Rough, MD

## 2011-09-12 ENCOUNTER — Other Ambulatory Visit: Payer: Self-pay | Admitting: *Deleted

## 2011-09-12 NOTE — Telephone Encounter (Signed)
Opened in Error.

## 2011-09-13 ENCOUNTER — Other Ambulatory Visit: Payer: Self-pay | Admitting: *Deleted

## 2011-09-13 DIAGNOSIS — I10 Essential (primary) hypertension: Secondary | ICD-10-CM

## 2011-09-13 DIAGNOSIS — R9431 Abnormal electrocardiogram [ECG] [EKG]: Secondary | ICD-10-CM

## 2011-09-13 MED ORDER — LISINOPRIL 10 MG PO TABS: 10.0000 mg | ORAL_TABLET | Freq: Every day | ORAL | Status: DC

## 2011-09-13 NOTE — Telephone Encounter (Signed)
Fax Received. Refill Completed. Beth [name] (R.M.A)   

## 2011-09-22 ENCOUNTER — Ambulatory Visit (INDEPENDENT_AMBULATORY_CARE_PROVIDER_SITE_OTHER): Payer: Medicare Other | Admitting: Cardiovascular Disease

## 2011-09-22 ENCOUNTER — Encounter: Payer: Self-pay | Admitting: Cardiovascular Disease

## 2011-09-22 ENCOUNTER — Other Ambulatory Visit (INDEPENDENT_AMBULATORY_CARE_PROVIDER_SITE_OTHER): Payer: Medicare Other

## 2011-09-22 VITALS — BP 151/72 | HR 76 | Ht 60.0 in | Wt 113.6 lb

## 2011-09-22 DIAGNOSIS — R0989 Other specified symptoms and signs involving the circulatory and respiratory systems: Secondary | ICD-10-CM

## 2011-09-22 DIAGNOSIS — I10 Essential (primary) hypertension: Secondary | ICD-10-CM | POA: Diagnosis not present

## 2011-09-22 DIAGNOSIS — I359 Nonrheumatic aortic valve disorder, unspecified: Secondary | ICD-10-CM

## 2011-09-22 DIAGNOSIS — I351 Nonrheumatic aortic (valve) insufficiency: Secondary | ICD-10-CM | POA: Insufficient documentation

## 2011-09-22 NOTE — Assessment & Plan Note (Signed)
She was found to have aortic insufficiency on echo. I was not able to hear a diastolic murmur on exam today.  I Think this is fairly benign for her. I do not anticipate that she'll need aortic valve surgery. She will followup as needed.

## 2011-09-22 NOTE — Progress Notes (Signed)
Mathis Fare Date of Birth  Oct 28, 1923       [facility]    [place] [address]  [address] [place], [place]  [address]   [place], [place]  [address] [phone]     [phone]   Fax  [phone]    Fax [phone]  Problem List: 1. Chest pain 2.   History of Present Illness:  Pt presents with a history of chest pain. She's had these chest pains for the past 10 days or so.  She tends to have them at various times.  They are occasionally associated with reaching up into the cabinet. They're not associated with exertion. There is  no pleuritic component.  They are not associated with eating or drinking.  The pains last for a second or two.  They pains are sometimes sharp and sometimes heavy.    She has had a cardiac cath long ago while in [place].  She was found to have some minor irregularities but no need for a stent.  Since Saw her a month ago we performed a stress test that revealed ischemia and normal left ventricular systolic function.  We also did an an echo that revealed: Left ventricle: The cavity size was normal. Wall thickness was increased in a pattern of mild LVH. Systolic function was normal. The estimated ejection fraction was in the range of 55% to 60%. Wall motion was normal; there were no regional wall motion abnormalities. Doppler parameters are consistent with abnormal left ventricular relaxation (grade 1 diastolic dysfunction). - Ventricular septum: Septal motion showed dyssynergy. These changes are consistent with intraventricular conduction delay. - Aortic valve: Mild to moderate regurgitation. - Pulmonary arteries: Systolic pressure was mildly increased. PA peak pressure: 36mm Hg (S)   Current Outpatient Prescriptions on File Prior to Visit  Medication Sig Dispense Refill  . alendronate (FOSAMAX) 70 MG tablet Take 1 tablet (70 mg total) by mouth every 7 (seven) days. Take with a full glass of  water on an empty stomach.  12 tablet  3  . calcium-vitamin D (OSCAL WITH D 500-200) 500-200 MG-UNIT per tablet Take 1 tablet by mouth.        . cetirizine (ZYRTEC) 10 MG tablet Take 10 mg by mouth daily as needed.        Marland Kitchen lisinopril (PRINIVIL,ZESTRIL) 10 MG tablet Take 1 tablet (10 mg total) by mouth daily.  90 tablet  3  . meclizine (ANTIVERT) 25 MG tablet Take 0.5 tablets (12.5 mg total) by mouth 3 (three) times daily as needed for dizziness.  30 tablet  1  . metoprolol succinate (TOPROL-XL) 25 MG 24 hr tablet Take 1 tablet (25 mg total) by mouth daily.  90 tablet  3  . Multiple Vitamin (MULTIVITAMIN) tablet Take 1 tablet by mouth daily.        . nitroGLYCERIN (NITROSTAT) 0.4 MG SL tablet Place 1 tablet (0.4 mg total) under the tongue every 5 (five) minutes as needed for chest pain.  10 tablet  1  . Saline (SODIUM CHLORIDE) 0.65 % SOLN by Nasal route. 2 sprays each nostril once daily as needed         Allergies  Allergen Reactions  . Ciprofloxacin     myalgias  . Fexofenadine   . Penicillins     Past Medical History  Diagnosis Date  . Hypertension   . Lung nodule     CT of chest 2007-left loewr lobar mass- evaluated by  pulmonary- patient refused further work up  . Tobacco abuse   . Vertigo     No past surgical history on file.  History  Smoking status  . Current Everyday Smoker -- 0.2 packs/day for 60 years  Smokeless tobacco  . Never Used    History  Alcohol Use No    Family History  Problem Relation Age of Onset  . Heart disease Mother   . Hypertension Mother   . Diabetes    . Cancer      lung- in distant releatives    Reviw of Systems:  Reviewed in the HPI.  All other systems are negative.  Physical Exam: Blood pressure 151/72, pulse 76, height 5' (1.524 m), weight 113 lb 9.6 oz (51.529 kg). General: Well developed, well nourished, in no acute distress.  Head: Normocephalic, atraumatic, sclera non-icteric, mucus membranes are moist,   Neck: Supple.  Carotids are 2 + without bruits. No JVD  Lungs: Clear bilaterally to auscultation.  Heart: regular rate.  normal  S1 S2. There is a mid systolic click and a soft systolic murmur.  Abdomen: Soft, non-tender, non-distended with normal bowel sounds. No hepatomegaly. No rebound/guarding. No masses.  Msk:  Strength and tone are normal  Extremities: No clubbing or cyanosis. No edema.  Distal pedal pulses are 2+ and equal bilaterally.  Neuro: Alert and oriented X 3. Moves all extremities spontaneously.  Psych:  Responds to questions appropriately with a normal affect.  ECG: From her medical doctor- Sinus  Rhythm at 66 bmp. -Left axis -anterior fascicular block.   -Old anteroseptal infarct.   -  Nonspecific T-abnormality.  Assessment / Plan:

## 2011-09-22 NOTE — Assessment & Plan Note (Addendum)
She had a negative stress Myoview study. Her echocardiogram revealed normal left systolic function.  I don't think that her chest pains are due to ischemic heart disease. She is fairly healthy 78 year old female.  We'll not schedule her for a return visit but will see her again on as-needed basis.

## 2011-09-22 NOTE — Patient Instructions (Addendum)
Ask Dr. Rodena Medin if he will follow you for your Hypertension.  He may want to increase your dose to 20 mg a day if your BP is still high.  You should have your basic metabolic profile checked 3 months after change of the dose.  Your physician recommends that you schedule a follow-up appointment in: AS NEEDED

## 2011-09-22 NOTE — Assessment & Plan Note (Addendum)
Her blood pressure is much better control with the addition of lisinopril.  Her echocardiogram revealed normal left ventricle systolic function. She has mild to moderate aortic insufficiency.   It is still a little bit elevated. Because of her chest pain workup has been relatively normal, we will turn her back over to her medical Dr. I suggested that he may want to increase her dose to lisinopril 20 mg a day. We will need to have a basic metabolic profile checked 3 months following that.  Have to see her in the future if needed. Otherwise she'll followup with Dr. Rodena Medin.

## 2011-09-23 LAB — BASIC METABOLIC PANEL
BUN: 11 mg/dL (ref 6–23)
Chloride: 106 mEq/L (ref 96–112)
Glucose, Bld: 85 mg/dL (ref 70–99)
Potassium: 3.8 mEq/L (ref 3.5–5.1)

## 2011-10-05 ENCOUNTER — Other Ambulatory Visit (INDEPENDENT_AMBULATORY_CARE_PROVIDER_SITE_OTHER): Payer: Medicare Other

## 2011-10-05 DIAGNOSIS — M810 Age-related osteoporosis without current pathological fracture: Secondary | ICD-10-CM

## 2011-10-05 DIAGNOSIS — I10 Essential (primary) hypertension: Secondary | ICD-10-CM | POA: Diagnosis not present

## 2011-10-05 LAB — BASIC METABOLIC PANEL
BUN: 12 mg/dL (ref 6–23)
CO2: 28 mEq/L (ref 19–32)
Calcium: 9.5 mg/dL (ref 8.4–10.5)
Creatinine, Ser: 0.8 mg/dL (ref 0.4–1.2)
Glucose, Bld: 104 mg/dL — ABNORMAL HIGH (ref 70–99)

## 2011-10-05 LAB — LDL CHOLESTEROL, DIRECT: Direct LDL: 127.9 mg/dL

## 2011-10-14 ENCOUNTER — Ambulatory Visit (INDEPENDENT_AMBULATORY_CARE_PROVIDER_SITE_OTHER): Payer: Medicare Other | Admitting: Internal Medicine

## 2011-10-14 ENCOUNTER — Encounter: Payer: Self-pay | Admitting: Internal Medicine

## 2011-10-14 ENCOUNTER — Telehealth: Payer: Self-pay | Admitting: Internal Medicine

## 2011-10-14 VITALS — BP 128/70 | HR 84 | Temp 98.1°F | Resp 14 | Wt 113.2 lb

## 2011-10-14 DIAGNOSIS — E559 Vitamin D deficiency, unspecified: Secondary | ICD-10-CM

## 2011-10-14 DIAGNOSIS — Z79899 Other long term (current) drug therapy: Secondary | ICD-10-CM

## 2011-10-14 DIAGNOSIS — I10 Essential (primary) hypertension: Secondary | ICD-10-CM

## 2011-10-14 DIAGNOSIS — R9431 Abnormal electrocardiogram [ECG] [EKG]: Secondary | ICD-10-CM | POA: Diagnosis not present

## 2011-10-14 MED ORDER — METOPROLOL SUCCINATE ER 25 MG PO TB24: 25.0000 mg | ORAL_TABLET | Freq: Every day | ORAL | Status: DC

## 2011-10-14 MED ORDER — LISINOPRIL 10 MG PO TABS: 10.0000 mg | ORAL_TABLET | Freq: Every day | ORAL | Status: DC

## 2011-10-14 NOTE — Patient Instructions (Addendum)
Pleas schedule labs prior to your next visit chem7-v58.69, cbc-401.9, vitamin d-vit d deficiency

## 2011-10-14 NOTE — Telephone Encounter (Signed)
°  ° °   Please schedule labs prior to your next visit  chem7-v58.69, cbc-401.9, vitamin d-vit d deficiency     Patient has upcoming appointment in late November. She will be going to [facility] lab

## 2011-10-14 NOTE — Progress Notes (Signed)
  Subjective:    Patient ID: Beth [name], female    DOB: October 14, 1923, 78 y.o.   MRN: [id number]  HPI Pt presents to clinic for followup of multiple medical problems. BP rechecked to be 128/78. Tolerating ace inhibitor without cough. Reviewed nl cr. No active complaints.  Past Medical History  Diagnosis Date  . Hypertension   . Lung nodule     CT of chest 2007-left loewr lobar mass- evaluated by pulmonary- patient refused further work up  . Tobacco abuse   . Vertigo    No past surgical history on file.  reports that she has been smoking.  She has never used smokeless tobacco. She reports that she does not drink alcohol. Her drug history not on file. family history includes Cancer in an unspecified family member; Diabetes in an unspecified family member; Heart disease in her mother; and Hypertension in her mother. Allergies  Allergen Reactions  . Ciprofloxacin     myalgias  . Fexofenadine   . Penicillins       Review of Systems see hpi     Objective:   Physical Exam  Physical Exam  Nursing note and vitals reviewed. Constitutional: Appears well-developed and well-nourished. No distress.  HENT:  Head: Normocephalic and atraumatic.  Right Ear: External ear normal.  Left Ear: External ear normal.  Eyes: Conjunctivae are normal. No scleral icterus.  Neck: Neck supple. Carotid bruit is not present.  Cardiovascular: Normal rate, regular rhythm and normal heart sounds.  Exam reveals no gallop and no friction rub.   No murmur heard. Pulmonary/Chest: Effort normal and breath sounds normal. No respiratory distress. He has no wheezes. no rales.  Lymphadenopathy:    He has no cervical adenopathy.  Neurological:Alert.  Skin: Skin is warm and dry. Not diaphoretic.  Psychiatric: Has a normal mood and affect.        Assessment & Plan:

## 2011-10-15 NOTE — Assessment & Plan Note (Signed)
Normotensive and stable. Continue current regimen. Monitor bp as outpt and followup in clinic as scheduled.  

## 2011-11-18 NOTE — Telephone Encounter (Signed)
Labs entered... 8/23/123@3 :34pm/LMB

## 2012-02-07 ENCOUNTER — Other Ambulatory Visit: Payer: Self-pay | Admitting: Internal Medicine

## 2012-02-07 NOTE — Telephone Encounter (Signed)
Metoprolol request denied as pt should still have refill on file from 10/14/11 #90 x 1 refill. Should get pt through January.

## 2012-02-10 ENCOUNTER — Ambulatory Visit: Payer: Medicare Other

## 2012-02-10 DIAGNOSIS — E559 Vitamin D deficiency, unspecified: Secondary | ICD-10-CM

## 2012-02-10 DIAGNOSIS — I10 Essential (primary) hypertension: Secondary | ICD-10-CM

## 2012-02-10 DIAGNOSIS — Z79899 Other long term (current) drug therapy: Secondary | ICD-10-CM

## 2012-02-10 LAB — BASIC METABOLIC PANEL
CO2: 29 mEq/L (ref 19–32)
Chloride: 108 mEq/L (ref 96–112)
Glucose, Bld: 103 mg/dL — ABNORMAL HIGH (ref 70–99)
Potassium: 4.5 mEq/L (ref 3.5–5.1)
Sodium: 142 mEq/L (ref 135–145)

## 2012-02-10 LAB — CBC WITH DIFFERENTIAL/PLATELET
Eosinophils Relative: 3.5 % (ref 0.0–5.0)
HCT: 41 % (ref 36.0–46.0)
Lymphs Abs: 1.8 10*3/uL (ref 0.7–4.0)
Monocytes Relative: 7.1 % (ref 3.0–12.0)
Neutrophils Relative %: 58 % (ref 43.0–77.0)
Platelets: 275 10*3/uL (ref 150.0–400.0)
WBC: 6 10*3/uL (ref 4.5–10.5)

## 2012-02-17 ENCOUNTER — Encounter: Payer: Self-pay | Admitting: Internal Medicine

## 2012-02-17 ENCOUNTER — Ambulatory Visit (INDEPENDENT_AMBULATORY_CARE_PROVIDER_SITE_OTHER): Payer: Medicare Other | Admitting: Internal Medicine

## 2012-02-17 ENCOUNTER — Telehealth: Payer: Self-pay | Admitting: Internal Medicine

## 2012-02-17 VITALS — BP 118/68 | HR 76 | Temp 98.4°F | Resp 14 | Wt 114.5 lb

## 2012-02-17 DIAGNOSIS — L659 Nonscarring hair loss, unspecified: Secondary | ICD-10-CM | POA: Diagnosis not present

## 2012-02-17 DIAGNOSIS — E559 Vitamin D deficiency, unspecified: Secondary | ICD-10-CM

## 2012-02-17 DIAGNOSIS — M810 Age-related osteoporosis without current pathological fracture: Secondary | ICD-10-CM

## 2012-02-17 NOTE — Patient Instructions (Signed)
Please take over the counter vitamin D 1000 units a day in addition to what you already take Schedule fasting labs prior to your next visit [id number] and vit d-vit d deficiency

## 2012-02-17 NOTE — Telephone Encounter (Signed)
Schedule fasting labs prior to your next visit  [id number] and vit d-vit d deficiency  Patient has upcoming appointment in march/2014. She will be going to [facility] lab.

## 2012-02-17 NOTE — Progress Notes (Signed)
  Subjective:    Patient ID: Beth [name], female    DOB: October 08, 1923, 78 y.o.   MRN: [id number]  HPI Pt presents to clinic for followup of multiple medical problems. Review history of osteoporosis noted on bone density 2012. Attempted Fosamax but stopped because of abdominal discomfort. Not interested in any further oral medication or infusion. Declines influenza vaccine. Notes chronic hair loss and thinning. Reviewed depressed vitamin D level at 30. Blood pressure reviewed as normotensive.  Past Medical History  Diagnosis Date  . Hypertension   . Lung nodule     CT of chest 2007-left loewr lobar mass- evaluated by pulmonary- patient refused further work up  . Tobacco abuse   . Vertigo    No past surgical history on file.  reports that she has been smoking.  She has never used smokeless tobacco. She reports that she does not drink alcohol. Her drug history not on file. family history includes Cancer in an unspecified family member; Diabetes in an unspecified family member; Heart disease in her mother; and Hypertension in her mother. Allergies  Allergen Reactions  . Ciprofloxacin     myalgias  . Fexofenadine   . Penicillins       Review of Systems see history of present illness     Objective:   Physical Exam  Physical Exam  Nursing note and vitals reviewed. Constitutional: Appears well-developed and well-nourished. No distress.  HENT:  Head: Normocephalic and atraumatic.  Right Ear: External ear normal.  Left Ear: External ear normal.  Eyes: Conjunctivae are normal. No scleral icterus.  Neck: Neck supple. Carotid bruit is not present.  Cardiovascular: Normal rate, regular rhythm and normal heart sounds.  Exam reveals no gallop and no friction rub.   3/6 systolic murmur Pulmonary/Chest: Effort normal and breath sounds normal. No respiratory distress. He has no wheezes. no rales.  Lymphadenopathy:    He has no cervical adenopathy.  Neurological:Alert.  Skin: Skin is  warm and dry. Not diaphoretic.  areas of patchy thinning of the hair especially the crown. No scalp scarring noted.  Psychiatric: Has a normal mood and affect.        Assessment & Plan:

## 2012-02-17 NOTE — Assessment & Plan Note (Signed)
Declines further treatment. Understands risk for fracture. Address vitamin D deficiency

## 2012-02-17 NOTE — Assessment & Plan Note (Signed)
Add additional thousand units of vitamin D daily to current dosing. Repeat vitamin D level with next visit.

## 2012-02-17 NOTE — Assessment & Plan Note (Signed)
Obtain TSH and ANA

## 2012-02-20 LAB — ANA: Anti Nuclear Antibody(ANA): NEGATIVE

## 2012-06-14 ENCOUNTER — Other Ambulatory Visit (INDEPENDENT_AMBULATORY_CARE_PROVIDER_SITE_OTHER): Payer: Medicare Other

## 2012-06-14 ENCOUNTER — Other Ambulatory Visit: Payer: Self-pay | Admitting: Emergency Medicine

## 2012-06-14 DIAGNOSIS — I10 Essential (primary) hypertension: Secondary | ICD-10-CM

## 2012-06-14 DIAGNOSIS — Z79899 Other long term (current) drug therapy: Secondary | ICD-10-CM | POA: Diagnosis not present

## 2012-06-14 DIAGNOSIS — E559 Vitamin D deficiency, unspecified: Secondary | ICD-10-CM

## 2012-06-14 LAB — CBC WITH DIFFERENTIAL/PLATELET
Basophils Relative: 1.3 % (ref 0.0–3.0)
Eosinophils Relative: 2 % (ref 0.0–5.0)
Lymphocytes Relative: 22.2 % (ref 12.0–46.0)
MCV: 89.5 fl (ref 78.0–100.0)
Monocytes Relative: 6.7 % (ref 3.0–12.0)
Neutrophils Relative %: 67.8 % (ref 43.0–77.0)
RBC: 4.53 Mil/uL (ref 3.87–5.11)
WBC: 5.9 10*3/uL (ref 4.5–10.5)

## 2012-06-14 LAB — BASIC METABOLIC PANEL
CO2: 24 mEq/L (ref 19–32)
Chloride: 108 mEq/L (ref 96–112)
Glucose, Bld: 101 mg/dL — ABNORMAL HIGH (ref 70–99)
Potassium: 5 mEq/L (ref 3.5–5.1)
Sodium: 142 mEq/L (ref 135–145)

## 2012-06-15 ENCOUNTER — Ambulatory Visit: Payer: Medicare Other | Admitting: Internal Medicine

## 2012-06-18 ENCOUNTER — Encounter: Payer: Self-pay | Admitting: Family Medicine

## 2012-06-18 ENCOUNTER — Ambulatory Visit (INDEPENDENT_AMBULATORY_CARE_PROVIDER_SITE_OTHER): Payer: Medicare Other | Admitting: Family Medicine

## 2012-06-18 VITALS — BP 158/78 | HR 89 | Temp 97.6°F | Ht 60.0 in | Wt 113.1 lb

## 2012-06-18 DIAGNOSIS — I10 Essential (primary) hypertension: Secondary | ICD-10-CM | POA: Diagnosis not present

## 2012-06-18 DIAGNOSIS — J310 Chronic rhinitis: Secondary | ICD-10-CM

## 2012-06-18 DIAGNOSIS — E559 Vitamin D deficiency, unspecified: Secondary | ICD-10-CM | POA: Diagnosis not present

## 2012-06-18 NOTE — Patient Instructions (Addendum)
Start a fatty acid, such as Krill oil (MegaRed) 1 cap daily Start Biotin cap daily Start a probiotic such as Digestive Advantage daily Start a Calcium/Magnesium/Zinc daily   Alopecia Areata Alopecia areata is a self-destructing (autoimmune) disease that results in the loss of hair. In this condition your body's immune system attacks the hair follicle. The hair follicle is responsible for growing hair. Hair loss can occur on the scalp and other parts of the body. It usually starts as one or more small, round, smooth patches of hair loss. It occurs in males and females of all ages and races, but usually starts before age 40. The scalp is the most commonly affected area, but the beard or any hair-bearing site can be affected. This type of hair loss does not leave scars where the hair was lost.  Many people with alopecia areata only have a few patches of hair loss. In others, extensive patchy hair loss occurs. In a few people, all scalp hair is lost (alopecia totalis), or hair is lost from the entire scalp and body (alopecia universalis). No matter how widespread the hair loss, the hair follicles remain alive and are ready to resume normal hair production whenever they receive the correct signal. Hair re-growth may occur without treatment and can even restart after years of hair loss.  CAUSES  It is thought that something triggers the immune system to stop hair growth. It is not always known what the cause is. Some people have genetic markers that can increase the chance of developing alopecia areata. Alopecia areata often occurs in families whose members have had:  Asthma.  Hay fever.  Atopic eczema.  Some autoimmune diseases may also be a trigger, such as:  Thyroid disease.  Diabetes.  Rheumatoid arthritis.  Lupus erythematosus.  Vitiligo.  Pernicious anemia.  Addison's disease. OTHER SYMPTOMS In some people, the nail beds may develop rows of tiny dents (stippling) or the nail beds  can become distorted. Other than the hair and nail beds, no other body part is affected.  PROGNOSIS  Alopecia areata is not medically disabling. People with alopecia areata are usually in excellent health. Hair loss can be emotionally difficult. The National Alopecia Areata Foundation has resources available to help individuals and families with alopecia areata. Their goal is to help people with the condition live full, productive lives. There are many successful, well-adjusted, contented people living with Alopecia areata. Alopecia areata can be overcome with:  A positive self image.  Sound medical facts.  The support of others, such as:  Sometimes professional counseling is helpful to develop one's self-confidence and positive self-image. TREATMENT  There is no cure for alopecia areata. There are several available treatments. Treatments are most effective in milder cases. No treatment is effective for everyone. Choice of treatment depends mainly on a person's age and the extent of their hair loss. Alopecia areata occurs in two forms:   A mild patchy form where less than 50 percent of scalp hair is lost.  An extensive form where greater than 50 percent of scalp hair is lost. These two forms of alopecia areata behave quite differently, and the choice of treatment depends on which form is present. Current treatments do not turn alopecia areata off. They can stimulate the hair follicle to produce hair.  Some medications used to treat mild cases include:  Cortisone injections. The most common treatment is the injection of cortisone into the bare skin patches. The injections are usually given by a caregiver specializing in  skin issues (dermatologist). This caregiver will use a tiny needle to give multiple injections into the skin in and around the bare patches. The injections are repeated once a month. If new hair growth occurs, it is usually visible within 4 weeks. Treatment does not prevent new  patches of hair loss from developing. There are few side effects from local cortisone injections. Occasionally, temporary dents (depressions) in the skin result from the local injections, but these dents can fill in by themselves.  Topical minoxidil. Five percent topical minoxidil solution applied twice daily may grow hair in alopecia areata. Scalp, eyebrows, and beard hair may respond. If scalp hair re-grows completely, treatment can be stopped. Response may improve if topical cortisone cream is applied 30 minutes after the minoxidil. Topical minoxidil is safe, easy to use, and does not lower blood pressure in persons with normal blood pressure. Minoxidil can lead to unwanted facial hair growth in some people.  Anthralin cream or ointment. Another treatment is the application of anthralin cream or ointment. Anthralin is a tar-like substance that has been used widely for psoriasis. Anthralin is applied to the bare patches once daily. It is washed off after a short time, usually 30 to 60 minutes later. If new hair growth occurs, it is seen in 8 to 12 weeks. Anthralin can be irritating to the skin. It can cause temporary, brownish discoloration of the treated skin. By using short treatment times, skin irritation and skin staining are reduced without decreasing effectiveness. Care must be taken not to get anthralin in the eyes. Some of the medications used for more extensive cases where there is greater than 50% hair loss include:  Cortisone pills. Cortisone pills are sometimes given for extensive scalp hair loss. Cortisone taken internally is much stronger than local injections of cortisone into the skin. It is necessary to discuss possible side effects of cortisone pills with your caregiver. In general, however, cortisone pills are used in relatively few patients with alopecia areata due to health risks from prolonged use. Also, hair that has grown is likely to fall out when the cortisone pills are  stopped.  Topical minoxidil. See previous explanation under mild, patchy alopecia areata. However, minoxidil is not effective in total loss of scalp hair (alopecia totalis).  Topical immunotherapy. Another method of treating alopecia areata or alopecia totalis/universalis involves producing an allergic rash or allergic contact dermatitis. Chemicals such as diphencyprone (DPCP) or squaric acid dibutyl ester (SADBE) are applied to the scalp to produce an allergic rash which resembles poison oak or ivy. Approximately 40% of patients treated with topical immunotherapy will re-grow scalp hair after about 6 months of treatment. Those who do successfully re-grow scalp hair will need to continue treatment to maintain hair re-growth.  Wigs. For extensive hair loss, a wig can be an important option for some people. Proper attention will make a quality wig look completely natural. A wig will need to be cut, thinned, and styled. To keep a net base wig from falling off, special double-sided tape can be purchased in beauty supply outlets and fastened to the inside of the wig.  For those with completely bare heads, there are suction caps to which any wig can be attached. There are also entire suction cap wig units. FOR MORE INFORMATION National Alopecia Areata Foundation: [email or web address] Document Released: 10/17/2003 Document Revised: 06/06/2011 Document Reviewed: 05/20/2008 [facility] Patient Information 2013 [place], [place].

## 2012-06-19 ENCOUNTER — Encounter: Payer: Self-pay | Admitting: Family Medicine

## 2012-06-19 NOTE — Progress Notes (Signed)
Quick Note:  Patient Informed and voiced understanding ______ 

## 2012-06-24 NOTE — Assessment & Plan Note (Signed)
Mild elevation today, encouraged DASH diet. If still hi at next visit will need to increase meds

## 2012-06-24 NOTE — Progress Notes (Signed)
Patient ID: Beth [name], female   DOB: [birth year], 79 y.o.   MRN: [id number] Beth [name] [id number] 04/19/1923 06/24/2012      Progress Note-Follow Up  Subjective  Chief Complaint  Chief Complaint  Patient presents with  . Follow-up    4 month    HPI  Patient is an 79 year old female who is here today in followup. He is complaining of hair loss. Going on for years but does feel worse recently. She is diffuse thinning but denies any itching or rash. No recent illness. Does acknowledge some frequent loose stool no bloody or tarry stools well. No chest pain or palpitations. No fevers or chills. No GU complaints. Has not tried any over-the-counter supplements for the hair loss.  Past Medical History  Diagnosis Date  . Hypertension   . Lung nodule     CT of chest 2007-left loewr lobar mass- evaluated by pulmonary- patient refused further work up  . Tobacco abuse   . Vertigo     History reviewed. No pertinent past surgical history.  Family History  Problem Relation Age of Onset  . Heart disease Mother   . Hypertension Mother   . Diabetes    . Cancer      lung- in distant releatives    History   Social History  . Marital Status: Widowed    Spouse Name: N/A    Number of Children: N/A  . Years of Education: N/A   Occupational History  . Not on file.   Social History Main Topics  . Smoking status: Current Every Day Smoker -- 0.25 packs/day for 60 years  . Smokeless tobacco: Never Used  . Alcohol Use: No  . Drug Use: Not on file  . Sexually Active: Not on file   Other Topics Concern  . Not on file   Social History Narrative  . No narrative on file    Current Outpatient Prescriptions on File Prior to Visit  Medication Sig Dispense Refill  . calcium-vitamin D (OSCAL WITH D 500-200) 500-200 MG-UNIT per tablet Take 1 tablet by mouth.        . cetirizine (ZYRTEC) 10 MG tablet Take 10 mg by mouth daily as needed.        Marland Kitchen lisinopril (PRINIVIL,ZESTRIL) 10 MG  tablet Take 1 tablet (10 mg total) by mouth daily.  90 tablet  1  . metoprolol succinate (TOPROL-XL) 25 MG 24 hr tablet Take 1 tablet (25 mg total) by mouth daily.  90 tablet  1  . Multiple Vitamin (MULTIVITAMIN) tablet Take 1 tablet by mouth daily.        . Saline (SODIUM CHLORIDE) 0.65 % SOLN by Nasal route. 2 sprays each nostril once daily as needed       . nitroGLYCERIN (NITROSTAT) 0.4 MG SL tablet Place 1 tablet (0.4 mg total) under the tongue every 5 (five) minutes as needed for chest pain.  10 tablet  1   No current facility-administered medications on file prior to visit.    Allergies  Allergen Reactions  . Ciprofloxacin     myalgias  . Fexofenadine   . Penicillins     Review of Systems  Review of Systems  Constitutional: Negative for fever and malaise/fatigue.  HENT: Negative for congestion.   Eyes: Negative for discharge.  Respiratory: Negative for shortness of breath.   Cardiovascular: Negative for chest pain, palpitations and leg swelling.  Gastrointestinal: Negative for nausea, abdominal pain and diarrhea.  Genitourinary: Negative for dysuria.  Musculoskeletal:  Negative for falls.  Skin: Negative for rash.  Neurological: Negative for loss of consciousness and headaches.  Endo/Heme/Allergies: Negative for polydipsia.  Psychiatric/Behavioral: Negative for depression and suicidal ideas. The patient is not nervous/anxious and does not have insomnia.     Objective  BP 158/78  Pulse 89  Temp(Src) 97.6 F (36.4 C) (Oral)  Ht 5' (1.524 m)  Wt 113 lb 1.3 oz (51.293 kg)  BMI 22.08 kg/m2  SpO2 95%  Physical Exam  Physical Exam  Constitutional: She is oriented to person, place, and time and well-developed, well-nourished, and in no distress. No distress.  HENT:  Head: Normocephalic and atraumatic.  Eyes: Conjunctivae are normal.  Neck: Neck supple. No thyromegaly present.  Cardiovascular: Normal rate and regular rhythm.   No murmur heard. Pulmonary/Chest:  Effort normal and breath sounds normal. She has no wheezes.  Abdominal: She exhibits no distension and no mass.  Musculoskeletal: She exhibits no edema.  Lymphadenopathy:    She has no cervical adenopathy.  Neurological: She is alert and oriented to person, place, and time.  Skin: Skin is warm and dry. No rash noted. She is not diaphoretic.  Psychiatric: Memory, affect and judgment normal.    Lab Results  Component Value Date   TSH 3.104 02/17/2012   Lab Results  Component Value Date   WBC 5.9 06/14/2012   HGB 13.9 06/14/2012   HCT 40.6 06/14/2012   MCV 89.5 06/14/2012   PLT 267.0 06/14/2012   Lab Results  Component Value Date   CREATININE 0.7 06/14/2012   BUN 11 06/14/2012   NA 142 06/14/2012   K 5.0 06/14/2012   CL 108 06/14/2012   CO2 24 06/14/2012   Lab Results  Component Value Date   ALT 9 07/05/2011   AST 16 07/05/2011   ALKPHOS 60 07/05/2011   BILITOT 0.5 07/05/2011   Lab Results  Component Value Date   CHOL 224* 10/05/2011   Lab Results  Component Value Date   HDL 71.60 10/05/2011   No results found for this basename: [facility]   Lab Results  Component Value Date   TRIG 64.0 10/05/2011   Lab Results  Component Value Date   CHOLHDL 3 10/05/2011     Assessment & Plan  HYPERTENSION Mild elevation today, encouraged DASH diet. If still hi at next visit will need to increase meds  Rhinitis Consider saline nasal prn  VITAMIN D DEFICIENCY Mild, encouraged vitamin d 2000iu daily

## 2012-06-24 NOTE — Assessment & Plan Note (Signed)
Mild, encouraged vitamin d 2000iu daily

## 2012-06-24 NOTE — Assessment & Plan Note (Signed)
Consider saline nasal prn

## 2012-08-09 ENCOUNTER — Other Ambulatory Visit: Payer: Self-pay | Admitting: Internal Medicine

## 2012-08-09 NOTE — Telephone Encounter (Signed)
Patient called in regarding this. She states that she only has two pills left

## 2012-08-14 ENCOUNTER — Encounter: Payer: Self-pay | Admitting: Family Medicine

## 2012-08-14 ENCOUNTER — Ambulatory Visit (INDEPENDENT_AMBULATORY_CARE_PROVIDER_SITE_OTHER): Payer: Medicare Other | Admitting: Family Medicine

## 2012-08-14 VITALS — BP 142/78 | HR 81 | Temp 98.0°F | Ht 60.0 in | Wt 112.1 lb

## 2012-08-14 DIAGNOSIS — R32 Unspecified urinary incontinence: Secondary | ICD-10-CM

## 2012-08-14 DIAGNOSIS — R109 Unspecified abdominal pain: Secondary | ICD-10-CM

## 2012-08-14 DIAGNOSIS — E785 Hyperlipidemia, unspecified: Secondary | ICD-10-CM | POA: Diagnosis not present

## 2012-08-14 DIAGNOSIS — I10 Essential (primary) hypertension: Secondary | ICD-10-CM

## 2012-08-14 NOTE — Progress Notes (Signed)
Patient ID: Beth [name], female   DOB: 1924-03-14, 79 y.o.   MRN: [id number] ISRAEL WUNDER [id number] 1923/04/08 08/14/2012      Progress Note-Follow Up  Subjective  Chief Complaint  Chief Complaint  Patient presents with  . Follow-up    2 month    HPI  Patient is an 79 year old Caucasian female who is in today for followup. She continues to have some mild abdominal pain but notes it is improved with fiber and probiotic. Should episode of left lower quadrant pain last week but that resolved. No nausea vomiting or anorexia. Bowels move well. No recent illness. No chest pain or palpitations, shortness of breath, fevers, GI or GU complaints.  Past Medical History  Diagnosis Date  . Hypertension   . Lung nodule     CT of chest 2007-left loewr lobar mass- evaluated by pulmonary- patient refused further work up  . Tobacco abuse   . Vertigo     History reviewed. No pertinent past surgical history.  Family History  Problem Relation Age of Onset  . Heart disease Mother   . Hypertension Mother   . Diabetes    . Cancer      lung- in distant releatives    History   Social History  . Marital Status: Widowed    Spouse Name: N/A    Number of Children: N/A  . Years of Education: N/A   Occupational History  . Not on file.   Social History Main Topics  . Smoking status: Current Every Day Smoker -- 0.25 packs/day for 60 years  . Smokeless tobacco: Never Used  . Alcohol Use: No  . Drug Use: Not on file  . Sexually Active: Not on file   Other Topics Concern  . Not on file   Social History Narrative  . No narrative on file    Current Outpatient Prescriptions on File Prior to Visit  Medication Sig Dispense Refill  . calcium-vitamin D (OSCAL WITH D 500-200) 500-200 MG-UNIT per tablet Take 1 tablet by mouth.        . cetirizine (ZYRTEC) 10 MG tablet Take 10 mg by mouth daily as needed.        Marland Kitchen lisinopril (PRINIVIL,ZESTRIL) 10 MG tablet Take 1 tablet (10 mg total) by  mouth daily.  90 tablet  1  . metoprolol succinate (TOPROL-XL) 25 MG 24 hr tablet TAKE 1 TABLET (25 MG TOTAL) BY MOUTH DAILY.  90 tablet  1  . Multiple Vitamin (MULTIVITAMIN) tablet Take 1 tablet by mouth daily.        . Saline (SODIUM CHLORIDE) 0.65 % SOLN by Nasal route. 2 sprays each nostril once daily as needed       . nitroGLYCERIN (NITROSTAT) 0.4 MG SL tablet Place 1 tablet (0.4 mg total) under the tongue every 5 (five) minutes as needed for chest pain.  10 tablet  1   No current facility-administered medications on file prior to visit.    Allergies  Allergen Reactions  . Ciprofloxacin     myalgias  . Fexofenadine   . Penicillins     Review of Systems  Review of Systems  Constitutional: Negative for fever and malaise/fatigue.  HENT: Negative for congestion.   Eyes: Negative for discharge.  Respiratory: Negative for shortness of breath.   Cardiovascular: Negative for chest pain, palpitations and leg swelling.  Gastrointestinal: Negative for nausea, abdominal pain and diarrhea.  Genitourinary: Positive for urgency and frequency. Negative for dysuria, hematuria and flank pain.  Musculoskeletal: Negative for falls.  Skin: Negative for rash.  Neurological: Negative for loss of consciousness and headaches.  Endo/Heme/Allergies: Negative for polydipsia.  Psychiatric/Behavioral: Negative for depression and suicidal ideas. The patient is not nervous/anxious and does not have insomnia.     Objective  BP 142/78  Pulse 81  Temp(Src) 98 F (36.7 C) (Oral)  Ht 5' (1.524 m)  Wt 112 lb 1.3 oz (50.839 kg)  BMI 21.89 kg/m2  SpO2 95%  Physical Exam  Physical Exam  Constitutional: She is well-developed, well-nourished, and in no distress. No distress.  HENT:  Left Ear: External ear normal.  Mouth/Throat: No oropharyngeal exudate.  Eyes: EOM are normal. Left eye exhibits no discharge. No scleral icterus.  Neck: No JVD present. No tracheal deviation present.  Cardiovascular:  Normal heart sounds and intact distal pulses.   Pulmonary/Chest: No respiratory distress. She has no rales.  Abdominal: Soft. Bowel sounds are normal. She exhibits no distension and no mass. There is tenderness. There is no guarding.  Musculoskeletal: She exhibits no edema and no tenderness.  Lymphadenopathy:    She has no cervical adenopathy.  Skin: No rash noted. No erythema.    Lab Results  Component Value Date   TSH 3.104 02/17/2012   Lab Results  Component Value Date   WBC 5.9 06/14/2012   HGB 13.9 06/14/2012   HCT 40.6 06/14/2012   MCV 89.5 06/14/2012   PLT 267.0 06/14/2012   Lab Results  Component Value Date   CREATININE 0.7 06/14/2012   BUN 11 06/14/2012   NA 142 06/14/2012   K 5.0 06/14/2012   CL 108 06/14/2012   CO2 24 06/14/2012   Lab Results  Component Value Date   ALT 9 07/05/2011   AST 16 07/05/2011   ALKPHOS 60 07/05/2011   BILITOT 0.5 07/05/2011   Lab Results  Component Value Date   CHOL 224* 10/05/2011   Lab Results  Component Value Date   HDL 71.60 10/05/2011   No results found for this basename: [facility]   Lab Results  Component Value Date   TRIG 64.0 10/05/2011   Lab Results  Component Value Date   CHOLHDL 3 10/05/2011     Assessment & Plan  HYPERTENSION Well controlled no changes  Abdominal pain, unspecified site Improved with fiber and probiotic supplements. Bowels moving well  Other and unspecified hyperlipidemia Mild, due for annual check prior to next visit. Avoid trans fats, consider krill oil caps

## 2012-08-14 NOTE — Patient Instructions (Addendum)
Nicotine Addiction Nicotine can act as both a stimulant (excites/activates) and a sedative (calms/quiets). Immediately after exposure to nicotine, there is a "kick" caused in part by the drug's stimulation of the adrenal glands and resulting discharge of adrenaline (epinephrine). The rush of adrenaline stimulates the body and causes a sudden release of sugar. This means that smokers are always slightly hyperglycemic. Hyperglycemic means that the blood sugar is high, just like in diabetics. Nicotine also decreases the amount of insulin which helps control sugar levels in the body. There is an increase in blood pressure, breathing, and the rate of heart beats.  In addition, nicotine indirectly causes a release of dopamine in the brain that controls pleasure and motivation. A similar reaction is seen with other drugs of abuse, such as cocaine and heroin. This dopamine release is thought to cause the pleasurable sensations when smoking. In some different cases, nicotine can also create a calming effect, depending on sensitivity of the smoker's nervous system and the dose of nicotine taken. WHAT HAPPENS WHEN NICOTINE IS TAKEN FOR LONG PERIODS OF TIME?  Long-term use of nicotine results in addiction. It is difficult to stop.  Repeated use of nicotine creates tolerance. Higher doses of nicotine are needed to get the "kick." When nicotine use is stopped, withdrawal may last a month or more. Withdrawal may begin within a few hours after the last cigarette. Symptoms peak within the first few days and may lessen within a few weeks. For some people, however, symptoms may last for months or longer. Withdrawal symptoms include:   Irritability.  Craving.  Learning and attention deficits.  Sleep disturbances.  Increased appetite. Craving for tobacco may last for 6 months or longer. Many behaviors done while using nicotine can also play a part in the severity of withdrawal symptoms. For some people, the feel,  smell, and sight of a cigarette and the ritual of obtaining, handling, lighting, and smoking the cigarette are closely linked with the pleasure of smoking. When stopped, they also miss the related behaviors which make the withdrawal or craving worse. While nicotine gum and patches may lessen the drug aspects of withdrawal, cravings often persist. WHAT ARE THE MEDICAL CONSEQUENCES OF NICOTINE USE?  Nicotine addiction accounts for one-third of all cancers. The top cancer caused by tobacco is lung cancer. Lung cancer is the number one cancer killer of both men and women.  Smoking is also associated with cancers of the:  Mouth.  Pharynx.  Larynx.  Esophagus.  Stomach.  Pancreas.  Cervix.  Kidney.  Ureter.  Bladder.  Smoking also causes lung diseases such as lasting (chronic) bronchitis and emphysema.  It worsens asthma in adults and children.  Smoking increases the risk of heart disease, including:  Stroke.  Heart attack.  Vascular disease.  Aneurysm.  Passive or secondary smoke can also increase medical risks including:  Asthma in children.  Sudden Infant Death Syndrome (SIDS).  Additionally, dropped cigarettes are the leading cause of residential fire fatalities.  Nicotine poisoning has been reported from accidental ingestion of tobacco products by children and pets. Death usually results in a few minutes from respiratory failure (when a person stops breathing) caused by paralysis. TREATMENT   Medication. Nicotine replacement medicines such as nicotine gum and the patch are used to stop smoking. These medicines gradually lower the dosage of nicotine in the body. These medicines do not contain the carbon monoxide and other toxins found in tobacco smoke.  Hypnotherapy.  Relaxation therapy.  Nicotine Anonymous (a 12-step support   program). Find times and locations in your local yellow pages. Document Released: 11/18/2003 Document Revised: 06/06/2011 Document  Reviewed: 04/11/2007 [employer] Patient Information 2013 [employer].  

## 2012-08-15 LAB — URINALYSIS
Bilirubin Urine: NEGATIVE
Ketones, ur: NEGATIVE mg/dL
Specific Gravity, Urine: 1.005 (ref 1.005–1.030)
pH: 7 (ref 5.0–8.0)

## 2012-08-16 ENCOUNTER — Ambulatory Visit: Payer: Medicare Other | Admitting: Family Medicine

## 2012-08-16 LAB — URINE CULTURE
Colony Count: NO GROWTH
Organism ID, Bacteria: NO GROWTH

## 2012-08-17 ENCOUNTER — Encounter: Payer: Self-pay | Admitting: Family Medicine

## 2012-08-17 DIAGNOSIS — R109 Unspecified abdominal pain: Secondary | ICD-10-CM

## 2012-08-17 DIAGNOSIS — E785 Hyperlipidemia, unspecified: Secondary | ICD-10-CM | POA: Insufficient documentation

## 2012-08-17 HISTORY — DX: Unspecified abdominal pain: R10.9

## 2012-08-17 HISTORY — DX: Hyperlipidemia, unspecified: E78.5

## 2012-08-17 NOTE — Assessment & Plan Note (Signed)
Well controlled no changes 

## 2012-08-17 NOTE — Assessment & Plan Note (Signed)
Mild, due for annual check prior to next visit. Avoid trans fats, consider krill oil caps

## 2012-08-17 NOTE — Assessment & Plan Note (Signed)
Improved with fiber and probiotic supplements. Bowels moving well

## 2012-10-12 ENCOUNTER — Other Ambulatory Visit: Payer: Self-pay | Admitting: Cardiovascular Disease

## 2012-10-12 ENCOUNTER — Other Ambulatory Visit: Payer: Self-pay | Admitting: Family Medicine

## 2012-11-13 ENCOUNTER — Ambulatory Visit: Payer: Medicare Other | Admitting: Family Medicine

## 2012-11-20 ENCOUNTER — Ambulatory Visit: Payer: Medicare Other | Admitting: Family Medicine

## 2013-01-14 ENCOUNTER — Ambulatory Visit: Payer: Medicare Other | Admitting: Family Medicine

## 2013-01-15 ENCOUNTER — Ambulatory Visit (INDEPENDENT_AMBULATORY_CARE_PROVIDER_SITE_OTHER): Payer: Medicare Other | Admitting: Family Medicine

## 2013-01-15 ENCOUNTER — Encounter: Payer: Self-pay | Admitting: Family Medicine

## 2013-01-15 VITALS — BP 142/80 | HR 73 | Temp 97.9°F | Ht 60.0 in | Wt 109.1 lb

## 2013-01-15 DIAGNOSIS — E785 Hyperlipidemia, unspecified: Secondary | ICD-10-CM

## 2013-01-15 DIAGNOSIS — I10 Essential (primary) hypertension: Secondary | ICD-10-CM

## 2013-01-15 DIAGNOSIS — E559 Vitamin D deficiency, unspecified: Secondary | ICD-10-CM | POA: Diagnosis not present

## 2013-01-15 DIAGNOSIS — R634 Abnormal weight loss: Secondary | ICD-10-CM

## 2013-01-15 DIAGNOSIS — I351 Nonrheumatic aortic (valve) insufficiency: Secondary | ICD-10-CM

## 2013-01-15 DIAGNOSIS — I359 Nonrheumatic aortic valve disorder, unspecified: Secondary | ICD-10-CM

## 2013-01-15 LAB — CBC
HCT: 39.3 % (ref 36.0–46.0)
MCH: 30.1 pg (ref 26.0–34.0)
MCV: 85.6 fL (ref 78.0–100.0)
Platelets: 298 10*3/uL (ref 150–400)
RBC: 4.59 MIL/uL (ref 3.87–5.11)

## 2013-01-15 NOTE — Progress Notes (Signed)
Patient ID: Beth [name], female   DOB: 1923-07-02, 79 y.o.   MRN: [id number] Beth [name] [id number] January 16, 1924 01/15/2013      Progress Note-Follow Up  Subjective  Chief Complaint  Chief Complaint  Patient presents with  . Follow-up    3 month    HPI  Patient is a 79 year old Caucasian female who is in today for followup. She reports she's eating better and her appetite is up somewhat. He acknowledges she still eats a significant amount of carbohydrates. No recent illness. She denies abdominal pain or GI or GU concerns at this time. No fevers, chest pain, palpitations, shortness of breath. Taking her medications as prescribed.  Past Medical History  Diagnosis Date  . Hypertension   . Lung nodule     CT of chest 2007-left loewr lobar mass- evaluated by pulmonary- patient refused further work up  . Tobacco abuse   . Vertigo   . Abdominal pain, unspecified site 08/17/2012  . Other and unspecified hyperlipidemia 08/17/2012    History reviewed. No pertinent past surgical history.  Family History  Problem Relation Age of Onset  . Heart disease Mother   . Hypertension Mother   . Diabetes    . Cancer      lung- in distant releatives    History   Social History  . Marital Status: Widowed    Spouse Name: N/A    Number of Children: N/A  . Years of Education: N/A   Occupational History  . Not on file.   Social History Main Topics  . Smoking status: Current Every Day Smoker -- 0.25 packs/day for 60 years  . Smokeless tobacco: Never Used  . Alcohol Use: No  . Drug Use: Not on file  . Sexual Activity: Not on file   Other Topics Concern  . Not on file   Social History Narrative  . No narrative on file    Current Outpatient Prescriptions on File Prior to Visit  Medication Sig Dispense Refill  . calcium-vitamin D (OSCAL WITH D 500-200) 500-200 MG-UNIT per tablet Take 1 tablet by mouth.        . cetirizine (ZYRTEC) 10 MG tablet Take 10 mg by mouth daily as  needed.        . fish oil-omega-3 fatty acids 1000 MG capsule Take 2 g by mouth daily.      Marland Kitchen lisinopril (PRINIVIL,ZESTRIL) 10 MG tablet Take 1 tablet (10 mg total) by mouth daily.  90 tablet  1  . lisinopril (PRINIVIL,ZESTRIL) 10 MG tablet TAKE 1 TABLET (10 MG TOTAL) BY MOUTH DAILY.  90 tablet  1  . metoprolol succinate (TOPROL-XL) 25 MG 24 hr tablet TAKE 1 TABLET (25 MG TOTAL) BY MOUTH DAILY.  90 tablet  1  . Multiple Vitamin (MULTIVITAMIN) tablet Take 1 tablet by mouth daily.        . Saline (SODIUM CHLORIDE) 0.65 % SOLN by Nasal route. 2 sprays each nostril once daily as needed       . nitroGLYCERIN (NITROSTAT) 0.4 MG SL tablet Place 1 tablet (0.4 mg total) under the tongue every 5 (five) minutes as needed for chest pain.  10 tablet  1   No current facility-administered medications on file prior to visit.    Allergies  Allergen Reactions  . Ciprofloxacin     myalgias  . Fexofenadine   . Penicillins     Review of Systems  Review of Systems  Constitutional: Negative for fever and malaise/fatigue.  HENT: Negative  for congestion.   Eyes: Negative for discharge.  Respiratory: Negative for shortness of breath.   Cardiovascular: Negative for chest pain, palpitations and leg swelling.  Gastrointestinal: Positive for nausea and vomiting. Negative for abdominal pain and diarrhea.  Genitourinary: Negative for dysuria.  Musculoskeletal: Negative for falls.  Skin: Negative for rash.  Neurological: Negative for loss of consciousness and headaches.  Endo/Heme/Allergies: Negative for polydipsia.  Psychiatric/Behavioral: Negative for depression and suicidal ideas. The patient is not nervous/anxious and does not have insomnia.     Objective  BP 152/82  Pulse 73  Temp(Src) 97.9 F (36.6 C) (Oral)  Ht 5' (1.524 m)  Wt 109 lb 1.3 oz (49.478 kg)  BMI 21.3 kg/m2  SpO2 96%  Physical Exam  Physical Exam  Constitutional: She is oriented to person, place, and time and well-developed,  well-nourished, and in no distress. No distress.  HENT:  Head: Normocephalic and atraumatic.  Eyes: Conjunctivae are normal.  Neck: Neck supple. No thyromegaly present.  Cardiovascular: Normal rate, regular rhythm and normal heart sounds.   No murmur heard. Pulmonary/Chest: Effort normal and breath sounds normal. She has no wheezes.  Abdominal: She exhibits no distension and no mass.  Musculoskeletal: She exhibits no edema.  Lymphadenopathy:    She has no cervical adenopathy.  Neurological: She is alert and oriented to person, place, and time.  Skin: Skin is warm and dry. No rash noted. She is not diaphoretic.  Psychiatric: Memory, affect and judgment normal.    Lab Results  Component Value Date   TSH 3.104 02/17/2012   Lab Results  Component Value Date   WBC 5.9 06/14/2012   HGB 13.9 06/14/2012   HCT 40.6 06/14/2012   MCV 89.5 06/14/2012   PLT 267.0 06/14/2012   Lab Results  Component Value Date   CREATININE 0.7 06/14/2012   BUN 11 06/14/2012   NA 142 06/14/2012   K 5.0 06/14/2012   CL 108 06/14/2012   CO2 24 06/14/2012   Lab Results  Component Value Date   ALT 9 07/05/2011   AST 16 07/05/2011   ALKPHOS 60 07/05/2011   BILITOT 0.5 07/05/2011   Lab Results  Component Value Date   CHOL 224* 10/05/2011   Lab Results  Component Value Date   HDL 71.60 10/05/2011   No results found for this basename: [facility]   Lab Results  Component Value Date   TRIG 64.0 10/05/2011   Lab Results  Component Value Date   CHOLHDL 3 10/05/2011     Assessment & Plan  HYPERTENSION Adequate control on recheck. No changes today  Loss of weight Persistent but she does report that her appetite is better. Encouraged increased protein intake. Patient declines further work up at this time and agrees to try and increase her caloric intake, will report any concerning symptoms.   Aortic insufficiency Does endorse SOB but only with excessive exertion such as stairs, not worsening.

## 2013-01-15 NOTE — Patient Instructions (Addendum)
Try ginger for the nausea if it returns. Start a probiotic such as Probiotic Perles  Eat more protein  Viral Gastroenteritis Viral gastroenteritis is also known as stomach flu. This condition affects the stomach and intestinal tract. It can cause sudden diarrhea and vomiting. The illness typically lasts 3 to 8 days. Most people develop an immune response that eventually gets rid of the virus. While this natural response develops, the virus can make you quite ill. CAUSES  Many different viruses can cause gastroenteritis, such as rotavirus or noroviruses. You can catch one of these viruses by consuming contaminated food or water. You may also catch a virus by sharing utensils or other personal items with an infected person or by touching a contaminated surface. SYMPTOMS  The most common symptoms are diarrhea and vomiting. These problems can cause a severe loss of body fluids (dehydration) and a body salt (electrolyte) imbalance. Other symptoms may include:  Fever.  Headache.  Fatigue.  Abdominal pain. DIAGNOSIS  Your caregiver can usually diagnose viral gastroenteritis based on your symptoms and a physical exam. A stool sample may also be taken to test for the presence of viruses or other infections. TREATMENT  This illness typically goes away on its own. Treatments are aimed at rehydration. The most serious cases of viral gastroenteritis involve vomiting so severely that you are not able to keep fluids down. In these cases, fluids must be given through an intravenous line (IV). HOME CARE INSTRUCTIONS   Drink enough fluids to keep your urine clear or pale yellow. Drink small amounts of fluids frequently and increase the amounts as tolerated.  Ask your caregiver for specific rehydration instructions.  Avoid:  Foods high in sugar.  Alcohol.  Carbonated drinks.  Tobacco.  Juice.  Caffeine drinks.  Extremely hot or cold fluids.  Fatty, greasy foods.  Too much intake of  anything at one time.  Dairy products until 24 to 48 hours after diarrhea stops.  You may consume probiotics. Probiotics are active cultures of beneficial bacteria. They may lessen the amount and number of diarrheal stools in adults. Probiotics can be found in yogurt with active cultures and in supplements.  Wash your hands well to avoid spreading the virus.  Only take over-the-counter or prescription medicines for pain, discomfort, or fever as directed by your caregiver. Do not give aspirin to children. Antidiarrheal medicines are not recommended.  Ask your caregiver if you should continue to take your regular prescribed and over-the-counter medicines.  Keep all follow-up appointments as directed by your caregiver. SEEK IMMEDIATE MEDICAL CARE IF:   You are unable to keep fluids down.  You do not urinate at least once every 6 to 8 hours.  You develop shortness of breath.  You notice blood in your stool or vomit. This may look like coffee grounds.  You have abdominal pain that increases or is concentrated in one small area (localized).  You have persistent vomiting or diarrhea.  You have a fever.  The patient is a child younger than 3 months, and he or she has a fever.  The patient is a child older than 3 months, and he or she has a fever and persistent symptoms.  The patient is a child older than 3 months, and he or she has a fever and symptoms suddenly get worse.  The patient is a baby, and he or she has no tears when crying. MAKE SURE YOU:   Understand these instructions.  Will watch your condition.  Will get help  right away if you are not doing well or get worse. Document Released: 03/14/2005 Document Revised: 06/06/2011 Document Reviewed: 12/29/2010 [facility] Patient Information 2014 [place], [place].

## 2013-01-16 LAB — HEPATIC FUNCTION PANEL
Albumin: 4.4 g/dL (ref 3.5–5.2)
Bilirubin, Direct: 0.1 mg/dL (ref 0.0–0.3)
Total Bilirubin: 0.5 mg/dL (ref 0.3–1.2)

## 2013-01-16 LAB — RENAL FUNCTION PANEL
Albumin: 4.4 g/dL (ref 3.5–5.2)
CO2: 26 mEq/L (ref 19–32)
Calcium: 10 mg/dL (ref 8.4–10.5)
Creat: 0.71 mg/dL (ref 0.50–1.10)
Phosphorus: 3.5 mg/dL (ref 2.3–4.6)
Sodium: 142 mEq/L (ref 135–145)

## 2013-01-16 LAB — LIPID PANEL
HDL: 72 mg/dL (ref >39)
LDL Cholesterol: 133 mg/dL — ABNORMAL HIGH (ref 0–99)
VLDL: 17 mg/dL (ref 0–40)

## 2013-01-19 ENCOUNTER — Encounter: Payer: Self-pay | Admitting: Family Medicine

## 2013-01-19 DIAGNOSIS — R634 Abnormal weight loss: Secondary | ICD-10-CM

## 2013-01-19 HISTORY — DX: Abnormal weight loss: R63.4

## 2013-01-19 NOTE — Assessment & Plan Note (Signed)
Persistent but she does report that her appetite is better. Encouraged increased protein intake. Patient declines further work up at this time and agrees to try and increase her caloric intake, will report any concerning symptoms.

## 2013-01-19 NOTE — Assessment & Plan Note (Signed)
Adequate control on recheck. No changes today

## 2013-01-19 NOTE — Assessment & Plan Note (Signed)
Does endorse SOB but only with excessive exertion such as stairs, not worsening.

## 2013-02-04 ENCOUNTER — Other Ambulatory Visit: Payer: Self-pay | Admitting: Family Medicine

## 2013-04-08 ENCOUNTER — Telehealth: Payer: Self-pay | Admitting: Internal Medicine

## 2013-04-08 NOTE — Telephone Encounter (Signed)
refill-lisinopril 10mg  tablet. Take one tablet by mouth daily. Qty 90 last fill 10.16.14

## 2013-04-09 MED ORDER — LISINOPRIL 10 MG PO TABS: 10.0000 mg | ORAL_TABLET | Freq: Every day | ORAL | Status: DC

## 2013-04-12 ENCOUNTER — Ambulatory Visit: Payer: Medicare Other | Admitting: Family Medicine

## 2013-05-17 ENCOUNTER — Ambulatory Visit: Payer: Medicare Other | Admitting: Family Medicine

## 2013-06-17 ENCOUNTER — Ambulatory Visit (INDEPENDENT_AMBULATORY_CARE_PROVIDER_SITE_OTHER): Payer: Medicare Other | Admitting: Family Medicine

## 2013-06-17 ENCOUNTER — Encounter: Payer: Self-pay | Admitting: Family Medicine

## 2013-06-17 VITALS — BP 170/78 | HR 89 | Temp 97.7°F | Ht 60.0 in | Wt 106.1 lb

## 2013-06-17 DIAGNOSIS — L578 Other skin changes due to chronic exposure to nonionizing radiation: Secondary | ICD-10-CM | POA: Diagnosis not present

## 2013-06-17 DIAGNOSIS — L989 Disorder of the skin and subcutaneous tissue, unspecified: Secondary | ICD-10-CM | POA: Diagnosis not present

## 2013-06-17 DIAGNOSIS — H60399 Other infective otitis externa, unspecified ear: Secondary | ICD-10-CM | POA: Diagnosis not present

## 2013-06-17 DIAGNOSIS — I10 Essential (primary) hypertension: Secondary | ICD-10-CM

## 2013-06-17 DIAGNOSIS — T7840XA Allergy, unspecified, initial encounter: Secondary | ICD-10-CM

## 2013-06-17 MED ORDER — FEXOFENADINE HCL 180 MG PO TABS: 180.0000 mg | ORAL_TABLET | Freq: Every day | ORAL | Status: DC

## 2013-06-17 MED ORDER — AMLODIPINE BESYLATE 2.5 MG PO TABS: 2.5000 mg | ORAL_TABLET | Freq: Every day | ORAL | Status: DC

## 2013-06-17 MED ORDER — FLUTICASONE PROPIONATE 50 MCG/ACT NA SUSP: 2.0000 | Freq: Every day | NASAL | Status: DC

## 2013-06-17 MED ORDER — NEOMYCIN-POLYMYXIN-HC 3.5-10000-1 OT SOLN: 4.0000 [drp] | Freq: Every evening | OTIC | Status: DC | PRN

## 2013-06-17 NOTE — Patient Instructions (Signed)

## 2013-06-17 NOTE — Progress Notes (Signed)
Pre visit review using our clinic review tool, if applicable. No additional management support is needed unless otherwise documented below in the visit note. 

## 2013-06-18 ENCOUNTER — Telehealth: Payer: Self-pay | Admitting: Family Medicine

## 2013-06-18 NOTE — Telephone Encounter (Signed)
Relevant patient education mailed to patient.  

## 2013-06-19 ENCOUNTER — Telehealth: Payer: Self-pay

## 2013-06-19 NOTE — Telephone Encounter (Signed)
Yes she stays on all and adds the Amlodipine

## 2013-06-19 NOTE — Telephone Encounter (Signed)
The daughter Irene Pap) called with a question regarding new blood pressure medication added on 06/17/13 OV. The patient is unsure if she is to continue on all other BP medications along with starting the Norvasc?  Advise please.  Ok to call back and speak to the daughter as the patients hearing aid is not working at this time.  Is ok to leave a detailed message on [phone]

## 2013-06-20 NOTE — Telephone Encounter (Signed)
Called daughter and left message to return call. JG//CMA

## 2013-06-21 NOTE — Telephone Encounter (Signed)
Left a detailed message on Beth [name]'s vm per her message to [place].

## 2013-06-22 ENCOUNTER — Encounter: Payer: Self-pay | Admitting: Family Medicine

## 2013-06-22 DIAGNOSIS — T7840XA Allergy, unspecified, initial encounter: Secondary | ICD-10-CM

## 2013-06-22 DIAGNOSIS — H60399 Other infective otitis externa, unspecified ear: Secondary | ICD-10-CM | POA: Insufficient documentation

## 2013-06-22 DIAGNOSIS — L989 Disorder of the skin and subcutaneous tissue, unspecified: Secondary | ICD-10-CM | POA: Insufficient documentation

## 2013-06-22 HISTORY — DX: Allergy, unspecified, initial encounter: T78.40XA

## 2013-06-22 NOTE — Progress Notes (Signed)
Patient ID: Beth [name], female   DOB: 09/10/1923, 80 y.o.   MRN: [id number] Beth [name] [id number] 11-10-1923 06/22/2013      Progress Note-Follow Up  Subjective  Chief Complaint  Chief Complaint  Patient presents with  . Follow-up    HPI  Patient is a 80 year old female in today for routine medical care. Is c/o nasal congestion, no green rhinorrhea, no fevers. C/o ears being itchy and full. No pain or discharge. Nasal saline has helped the nose some. Was having some trouble with rash in her groin recently improved with A&D ointment. C/o a scaly spot on scalp, neck, ear on right. Benefiber has helped bowels. Still struggles with some urinary frequency, no dysuria or hematuria. No CP/palp/SOB/HA Past Medical History  Diagnosis Date  . Hypertension   . Lung nodule     CT of chest 2007-left loewr lobar mass- evaluated by pulmonary- patient refused further work up  . Tobacco abuse   . Vertigo   . Abdominal pain, unspecified site 08/17/2012  . Other and unspecified hyperlipidemia 08/17/2012  . Loss of weight 01/19/2013  . Allergic state 06/22/2013    History reviewed. No pertinent past surgical history.  Family History  Problem Relation Age of Onset  . Heart disease Mother   . Hypertension Mother   . Diabetes    . Cancer      lung- in distant releatives    History   Social History  . Marital Status: Widowed    Spouse Name: N/A    Number of Children: N/A  . Years of Education: N/A   Occupational History  . Not on file.   Social History Main Topics  . Smoking status: Current Every Day Smoker -- 0.25 packs/day for 60 years  . Smokeless tobacco: Never Used  . Alcohol Use: No  . Drug Use: Not on file  . Sexual Activity: Not on file   Other Topics Concern  . Not on file   Social History Narrative  . No narrative on file    Current Outpatient Prescriptions on File Prior to Visit  Medication Sig Dispense Refill  . calcium-vitamin D (OSCAL WITH D 500-200)  500-200 MG-UNIT per tablet Take 1 tablet by mouth.        . fish oil-omega-3 fatty acids 1000 MG capsule Take 2 g by mouth daily.      Marland Kitchen lisinopril (PRINIVIL,ZESTRIL) 10 MG tablet Take 1 tablet (10 mg total) by mouth daily.  90 tablet  1  . metoprolol succinate (TOPROL-XL) 25 MG 24 hr tablet TAKE 1 TABLET (25 MG TOTAL) BY MOUTH DAILY.  90 tablet  1  . Multiple Vitamin (MULTIVITAMIN) tablet Take 1 tablet by mouth daily.        . Saline (SODIUM CHLORIDE) 0.65 % SOLN by Nasal route. 2 sprays each nostril once daily as needed       . nitroGLYCERIN (NITROSTAT) 0.4 MG SL tablet Place 1 tablet (0.4 mg total) under the tongue every 5 (five) minutes as needed for chest pain.  10 tablet  1   No current facility-administered medications on file prior to visit.    Allergies  Allergen Reactions  . Ciprofloxacin     myalgias  . Fexofenadine   . Penicillins     Review of Systems  Review of Systems  Constitutional: Negative for fever and malaise/fatigue.  HENT: Positive for congestion. Negative for tinnitus.   Eyes: Negative for discharge.  Respiratory: Negative for shortness of breath.  Cardiovascular: Negative for chest pain, palpitations and leg swelling.  Gastrointestinal: Negative for nausea, abdominal pain and diarrhea.  Genitourinary: Positive for urgency and frequency. Negative for dysuria.  Musculoskeletal: Negative for falls.  Skin: Negative for rash.  Neurological: Negative for loss of consciousness and headaches.  Endo/Heme/Allergies: Negative for polydipsia.  Psychiatric/Behavioral: Negative for depression and suicidal ideas. The patient is not nervous/anxious and does not have insomnia.     Objective  BP 170/78  Pulse 89  Temp(Src) 97.7 F (36.5 C) (Oral)  Ht 5' (1.524 m)  Wt 106 lb 1.9 oz (48.136 kg)  BMI 20.73 kg/m2  SpO2 95%  Physical Exam  Physical Exam  Constitutional: She is oriented to person, place, and time and well-developed, well-nourished, and in no  distress. No distress.  HENT:  Head: Normocephalic and atraumatic.  Eyes: Conjunctivae are normal.  Neck: Neck supple. No thyromegaly present.  Cardiovascular: Normal rate and regular rhythm.   Murmur heard. Pulmonary/Chest: Effort normal and breath sounds normal. She has no wheezes.  Abdominal: She exhibits no distension and no mass.  Musculoskeletal: She exhibits no edema.  Lymphadenopathy:    She has no cervical adenopathy.  Neurological: She is alert and oriented to person, place, and time.  Skin: Skin is warm and dry. No rash noted. She is not diaphoretic.  Psychiatric: Memory, affect and judgment normal.    Lab Results  Component Value Date   TSH 2.555 01/15/2013   Lab Results  Component Value Date   WBC 6.9 01/15/2013   HGB 13.8 01/15/2013   HCT 39.3 01/15/2013   MCV 85.6 01/15/2013   PLT 298 01/15/2013   Lab Results  Component Value Date   CREATININE 0.71 01/15/2013   BUN 8 01/15/2013   NA 142 01/15/2013   K 4.1 01/15/2013   CL 105 01/15/2013   CO2 26 01/15/2013   Lab Results  Component Value Date   ALT 10 01/15/2013   AST 18 01/15/2013   ALKPHOS 63 01/15/2013   BILITOT 0.5 01/15/2013   Lab Results  Component Value Date   CHOL 222* 01/15/2013   Lab Results  Component Value Date   HDL 72 01/15/2013   Lab Results  Component Value Date   LDLCALC 133* 01/15/2013   Lab Results  Component Value Date   TRIG 83 01/15/2013   Lab Results  Component Value Date   CHOLHDL 3.1 01/15/2013     Assessment & Plan  HYPERTENSION Poorly controlled will alter medications, encouraged DASH diet, minimize caffeine and obtain adequate sleep. Report concerning symptoms and follow up as directed and as needed. Amlodipine 2.5 mg daily.  Allergic state Continue Allegra and Flonase  Facial skin lesion And some damaged skin. Referred to dermatology  Otitis, externa, infective Cortisporin otic prescribed

## 2013-06-22 NOTE — Assessment & Plan Note (Signed)
Poorly controlled will alter medications, encouraged DASH diet, minimize caffeine and obtain adequate sleep. Report concerning symptoms and follow up as directed and as needed. Amlodipine 2.5 mg daily.

## 2013-06-22 NOTE — Assessment & Plan Note (Signed)
And some damaged skin. Referred to dermatology

## 2013-06-22 NOTE — Assessment & Plan Note (Signed)
Continue Allegra and Flonase 

## 2013-06-22 NOTE — Assessment & Plan Note (Signed)
Cortisporin otic prescribed 

## 2013-06-25 ENCOUNTER — Ambulatory Visit (INDEPENDENT_AMBULATORY_CARE_PROVIDER_SITE_OTHER): Payer: Medicare Other | Admitting: Emergency Medicine

## 2013-06-25 VITALS — BP 162/80 | HR 92 | Temp 98.0°F | Resp 17 | Ht 61.0 in | Wt 108.0 lb

## 2013-06-25 DIAGNOSIS — I10 Essential (primary) hypertension: Secondary | ICD-10-CM

## 2013-06-25 NOTE — Patient Instructions (Signed)

## 2013-06-25 NOTE — Progress Notes (Signed)
[facility] [address] [place] [address] [phone]  Date:  06/25/2013   Name:  Beth [name]   DOB:  11/23/1923   MRN:  [id number]  PCP:  Penni Homans, MD    Chief Complaint: Hypertension and Tinnitus   History of Present Illness:  Beth [name] is a 80 y.o. very pleasant female patient who presents with the following:  Seen by FMD 3/23 and had amlodipine added to her regimen for hypertension.  She now is in the office out of concern that her pressure is too high and she is experiencing some ringing in her ears.  No headache, chest pain or tightness or shortness of breath.  No acute neuro symptoms.  No improvement with over the counter medications or other home remedies. Denies other complaint or health concern today.   Patient Active Problem List   Diagnosis Date Noted  . Allergic state 06/22/2013  . Facial skin lesion 06/22/2013  . Otitis, externa, infective 06/22/2013  . Loss of weight 01/19/2013  . Abdominal pain, unspecified site 08/17/2012  . Other and unspecified hyperlipidemia 08/17/2012  . Aortic insufficiency 09/22/2011  . Chest pain 08/14/2011  . Dystrophic nail 05/14/2011  . Neck pain 03/08/2011  . TMJ syndrome 03/08/2011  . Fatigue 02/11/2011  . Rhinitis 02/11/2011  . Alopecia 10/16/2010  . OSTEOPOROSIS 07/08/2008  . LUNG NODULE 06/03/2008  . VITAMIN D DEFICIENCY 10/30/2007  . GROSS HEMATURIA 03/13/2007  . MICROSCOPIC HEMATURIA 02/20/2007  . HYPERTENSION 02/14/2007    Past Medical History  Diagnosis Date  . Hypertension   . Lung nodule     CT of chest 2007-left loewr lobar mass- evaluated by pulmonary- patient refused further work up  . Tobacco abuse   . Vertigo   . Abdominal pain, unspecified site 08/17/2012  . Other and unspecified hyperlipidemia 08/17/2012  . Loss of weight 01/19/2013  . Allergic state 06/22/2013  . Allergy   . Osteoporosis     Past Surgical History  Procedure Laterality Date  .  Cholecystectomy    . Cesarean section    . Hernia repair    . Abdominal hysterectomy      History  Substance Use Topics  . Smoking status: Current Every Day Smoker -- 0.25 packs/day for 60 years  . Smokeless tobacco: Never Used  . Alcohol Use: No    Family History  Problem Relation Age of Onset  . Heart disease Mother   . Hypertension Mother   . Diabetes Mother   . Diabetes    . Cancer      lung- in distant releatives  . Cancer Brother     Allergies  Allergen Reactions  . Ciprofloxacin     myalgias  . Fexofenadine   . Penicillins     Medication list has been reviewed and updated.  Current Outpatient Prescriptions on File Prior to Visit  Medication Sig Dispense Refill  . amLODipine (NORVASC) 2.5 MG tablet Take 1 tablet (2.5 mg total) by mouth daily.  30 tablet  2  . calcium-vitamin D (OSCAL WITH D 500-200) 500-200 MG-UNIT per tablet Take 1 tablet by mouth.        . fexofenadine (ALLEGRA) 180 MG tablet Take 1 tablet (180 mg total) by mouth daily.  30 tablet  5  . fish oil-omega-3 fatty acids 1000 MG capsule Take 2 g by mouth daily.      . fluticasone (FLONASE) 50 MCG/ACT nasal spray Place 2 sprays into both nostrils daily.  16 g  6  . lisinopril (PRINIVIL,ZESTRIL) 10 MG tablet Take 1 tablet (10 mg total) by mouth daily.  90 tablet  1  . metoprolol succinate (TOPROL-XL) 25 MG 24 hr tablet TAKE 1 TABLET (25 MG TOTAL) BY MOUTH DAILY.  90 tablet  1  . Multiple Vitamin (MULTIVITAMIN) tablet Take 1 tablet by mouth daily.        Marland Kitchen neomycin-polymyxin-hydrocortisone (CORTISPORIN) otic solution Place 4 drops into both ears at bedtime as needed (x 5 days as needed for itchy, pain, discharge from ears.).  10 mL  1  . nitroGLYCERIN (NITROSTAT) 0.4 MG SL tablet Place 1 tablet (0.4 mg total) under the tongue every 5 (five) minutes as needed for chest pain.  10 tablet  1  . Saline (SODIUM CHLORIDE) 0.65 % SOLN by Nasal route. 2 sprays each nostril once daily as needed        No current  facility-administered medications on file prior to visit.    Review of Systems:  As per HPI, otherwise negative.    Physical Examination: Filed Vitals:   06/25/13 2031  BP: 162/80  Pulse: 92  Temp: 98 F (36.7 C)  Resp: 17   Filed Vitals:   06/25/13 2031  Height: 5\' 1"  (1.549 m)  Weight: 108 lb (48.988 kg)   Body mass index is 20.42 kg/(m^2). Ideal Body Weight: Weight in (lb) to have BMI = 25: 132  GEN: WDWN, NAD, Non-toxic, A & O x 3 HEENT: Atraumatic, Normocephalic. Neck supple. No masses, No LAD. Ears and Nose: No external deformity. CV: RRR, No M/G/R. No JVD. No thrill. No extra heart sounds. PULM: CTA B, no wheezes, crackles, rhonchi. No retractions. No resp. distress. No accessory muscle use. ABD: S, NT, ND, +BS. No rebound. No HSM. EXTR: No c/c/e NEURO Normal gait.  PSYCH: Normally interactive. Conversant. Not depressed or anxious appearing.  Calm demeanor.    Assessment and Plan: Hypertension Suggest increase amlodipine to 5.0 but ok with FMD   Signed,  Ellison Carwin, MD

## 2013-06-26 ENCOUNTER — Telehealth: Payer: Self-pay | Admitting: Family Medicine

## 2013-06-26 NOTE — Telephone Encounter (Signed)
She can increase her Amlodipine to 5 mg daily and she can take it at the same time as the metoprolol or she can split them up, either way is OK. Make sure she has an appt in about 2 weeks to see how the dose adjustment was tolerated

## 2013-06-26 NOTE — Telephone Encounter (Signed)
Daughter called in because she had to take patient to Urgent care last night, patient was experiencing roaring in her ears, her hearing aids are not working. Urgent care suggested that her amlodipine be increased to 5mg .  Also, daughter would like to know if it is ok for patient to take all of her blood pressure medications at the same time or if she should split them up

## 2013-06-27 MED ORDER — AMLODIPINE BESYLATE 5 MG PO TABS
5.0000 mg | ORAL_TABLET | Freq: Every day | ORAL | Status: DC
Start: 2013-06-27 — End: 2013-07-02

## 2013-06-27 NOTE — Telephone Encounter (Signed)
pts daughter and pt informed. pts daughter had to get back to work so she said she will call back to schedule an appt

## 2013-06-28 ENCOUNTER — Other Ambulatory Visit: Payer: Self-pay

## 2013-06-28 ENCOUNTER — Encounter (HOSPITAL_BASED_OUTPATIENT_CLINIC_OR_DEPARTMENT_OTHER): Payer: Self-pay | Admitting: Emergency Medicine

## 2013-06-28 ENCOUNTER — Emergency Department (HOSPITAL_BASED_OUTPATIENT_CLINIC_OR_DEPARTMENT_OTHER): Payer: Medicare Other

## 2013-06-28 ENCOUNTER — Inpatient Hospital Stay (HOSPITAL_BASED_OUTPATIENT_CLINIC_OR_DEPARTMENT_OTHER)
Admission: EM | Admit: 2013-06-28 | Discharge: 2013-07-02 | DRG: 308 | Disposition: A | Discharge status: Skilled Nursing Facility | Payer: Medicare Other | Attending: Internal Medicine | Admitting: Internal Medicine

## 2013-06-28 DIAGNOSIS — S2249XA Multiple fractures of ribs, unspecified side, initial encounter for closed fracture: Secondary | ICD-10-CM | POA: Diagnosis not present

## 2013-06-28 DIAGNOSIS — I4891 Unspecified atrial fibrillation: Secondary | ICD-10-CM | POA: Diagnosis not present

## 2013-06-28 DIAGNOSIS — R634 Abnormal weight loss: Secondary | ICD-10-CM | POA: Diagnosis not present

## 2013-06-28 DIAGNOSIS — F17200 Nicotine dependence, unspecified, uncomplicated: Secondary | ICD-10-CM | POA: Diagnosis present

## 2013-06-28 DIAGNOSIS — H9190 Unspecified hearing loss, unspecified ear: Secondary | ICD-10-CM | POA: Diagnosis present

## 2013-06-28 DIAGNOSIS — E46 Unspecified protein-calorie malnutrition: Secondary | ICD-10-CM | POA: Diagnosis not present

## 2013-06-28 DIAGNOSIS — I10 Essential (primary) hypertension: Secondary | ICD-10-CM | POA: Diagnosis not present

## 2013-06-28 DIAGNOSIS — S2220XA Unspecified fracture of sternum, initial encounter for closed fracture: Secondary | ICD-10-CM | POA: Diagnosis not present

## 2013-06-28 DIAGNOSIS — IMO0001 Reserved for inherently not codable concepts without codable children: Secondary | ICD-10-CM | POA: Diagnosis not present

## 2013-06-28 DIAGNOSIS — M545 Low back pain, unspecified: Secondary | ICD-10-CM | POA: Diagnosis not present

## 2013-06-28 DIAGNOSIS — Z79899 Other long term (current) drug therapy: Secondary | ICD-10-CM | POA: Diagnosis not present

## 2013-06-28 DIAGNOSIS — I369 Nonrheumatic tricuspid valve disorder, unspecified: Secondary | ICD-10-CM | POA: Diagnosis not present

## 2013-06-28 DIAGNOSIS — W1809XA Striking against other object with subsequent fall, initial encounter: Secondary | ICD-10-CM | POA: Diagnosis present

## 2013-06-28 DIAGNOSIS — IMO0002 Reserved for concepts with insufficient information to code with codable children: Secondary | ICD-10-CM | POA: Diagnosis not present

## 2013-06-28 DIAGNOSIS — Y92009 Unspecified place in unspecified non-institutional (private) residence as the place of occurrence of the external cause: Secondary | ICD-10-CM

## 2013-06-28 DIAGNOSIS — E785 Hyperlipidemia, unspecified: Secondary | ICD-10-CM | POA: Diagnosis present

## 2013-06-28 DIAGNOSIS — R55 Syncope and collapse: Secondary | ICD-10-CM | POA: Diagnosis not present

## 2013-06-28 DIAGNOSIS — E43 Unspecified severe protein-calorie malnutrition: Secondary | ICD-10-CM | POA: Diagnosis not present

## 2013-06-28 DIAGNOSIS — Z9181 History of falling: Secondary | ICD-10-CM | POA: Diagnosis not present

## 2013-06-28 DIAGNOSIS — M6281 Muscle weakness (generalized): Secondary | ICD-10-CM | POA: Diagnosis not present

## 2013-06-28 DIAGNOSIS — I359 Nonrheumatic aortic valve disorder, unspecified: Secondary | ICD-10-CM

## 2013-06-28 DIAGNOSIS — I2510 Atherosclerotic heart disease of native coronary artery without angina pectoris: Secondary | ICD-10-CM | POA: Diagnosis present

## 2013-06-28 DIAGNOSIS — R5381 Other malaise: Secondary | ICD-10-CM | POA: Diagnosis present

## 2013-06-28 DIAGNOSIS — T148XXA Other injury of unspecified body region, initial encounter: Secondary | ICD-10-CM

## 2013-06-28 DIAGNOSIS — S79919A Unspecified injury of unspecified hip, initial encounter: Secondary | ICD-10-CM | POA: Diagnosis not present

## 2013-06-28 DIAGNOSIS — S42302A Unspecified fracture of shaft of humerus, left arm, initial encounter for closed fracture: Secondary | ICD-10-CM | POA: Diagnosis not present

## 2013-06-28 DIAGNOSIS — S20219A Contusion of unspecified front wall of thorax, initial encounter: Secondary | ICD-10-CM | POA: Diagnosis not present

## 2013-06-28 DIAGNOSIS — I351 Nonrheumatic aortic (valve) insufficiency: Secondary | ICD-10-CM | POA: Diagnosis present

## 2013-06-28 DIAGNOSIS — R262 Difficulty in walking, not elsewhere classified: Secondary | ICD-10-CM | POA: Diagnosis not present

## 2013-06-28 DIAGNOSIS — M25559 Pain in unspecified hip: Secondary | ICD-10-CM | POA: Diagnosis not present

## 2013-06-28 LAB — CBC WITH DIFFERENTIAL/PLATELET
BASOS ABS: 0 10*3/uL (ref 0.0–0.1)
Basophils Relative: 0 % (ref 0–1)
EOS PCT: 0 % (ref 0–5)
Eosinophils Absolute: 0 10*3/uL (ref 0.0–0.7)
HCT: 42.9 % (ref 36.0–46.0)
Hemoglobin: 14.7 g/dL (ref 12.0–15.0)
Lymphocytes Relative: 6 % — ABNORMAL LOW (ref 12–46)
Lymphs Abs: 0.8 10*3/uL (ref 0.7–4.0)
MCH: 30.9 pg (ref 26.0–34.0)
MCHC: 34.3 g/dL (ref 30.0–36.0)
MCV: 90.3 fL (ref 78.0–100.0)
MONO ABS: 0.7 10*3/uL (ref 0.1–1.0)
Monocytes Relative: 5 % (ref 3–12)
NEUTROS ABS: 11.5 10*3/uL — AB (ref 1.7–7.7)
Neutrophils Relative %: 88 % — ABNORMAL HIGH (ref 43–77)
Platelets: 241 10*3/uL (ref 150–400)
RBC: 4.75 MIL/uL (ref 3.87–5.11)
RDW: 13.3 % (ref 11.5–15.5)
WBC: 13 10*3/uL — AB (ref 4.0–10.5)

## 2013-06-28 LAB — COMPREHENSIVE METABOLIC PANEL
ALBUMIN: 3.7 g/dL (ref 3.5–5.2)
ALT: 12 U/L (ref 0–35)
AST: 21 U/L (ref 0–37)
Alkaline Phosphatase: 57 U/L (ref 39–117)
BUN: 14 mg/dL (ref 6–23)
CALCIUM: 9.3 mg/dL (ref 8.4–10.5)
CO2: 24 mEq/L (ref 19–32)
CREATININE: 0.6 mg/dL (ref 0.50–1.10)
Chloride: 105 mEq/L (ref 96–112)
GFR calc Af Amer: >90 mL/min (ref >90)
GFR calc non Af Amer: 80 mL/min — ABNORMAL LOW (ref >90)
Glucose, Bld: 145 mg/dL — ABNORMAL HIGH (ref 70–99)
Potassium: 3.9 mEq/L (ref 3.7–5.3)
Sodium: 143 mEq/L (ref 137–147)
Total Bilirubin: 0.4 mg/dL (ref 0.3–1.2)
Total Protein: 7 g/dL (ref 6.0–8.3)

## 2013-06-28 LAB — PROTIME-INR
INR: 1.06 (ref 0.00–1.49)
Prothrombin Time: 13.6 seconds (ref 11.6–15.2)

## 2013-06-28 LAB — TROPONIN I

## 2013-06-28 LAB — MRSA PCR SCREENING: MRSA BY PCR: NEGATIVE

## 2013-06-28 MED ORDER — NEOMYCIN-POLYMYXIN-HC 3.5-10000-1 OT SOLN: 4.0000 [drp] | Freq: Every evening | OTIC | Status: DC | PRN

## 2013-06-28 MED ORDER — SODIUM CHLORIDE 0.9 % IJ SOLN
3.0000 mL | Freq: Two times a day (BID) | INTRAMUSCULAR | Status: DC
  Administered 2013-06-29 – 2013-07-02 (×5): 3 mL via INTRAVENOUS

## 2013-06-28 MED ORDER — METOPROLOL SUCCINATE ER 25 MG PO TB24
25.0000 mg | ORAL_TABLET | Freq: Every day | ORAL | Status: DC
  Administered 2013-06-28 – 2013-07-02 (×5): 25 mg via ORAL
  Filled 2013-06-28 (×6): qty 1

## 2013-06-28 MED ORDER — OMEGA-3-ACID ETHYL ESTERS 1 G PO CAPS
2.0000 g | ORAL_CAPSULE | Freq: Two times a day (BID) | ORAL | Status: DC
  Administered 2013-06-29 – 2013-07-02 (×7): 2 g via ORAL
  Filled 2013-06-28 (×10): qty 2

## 2013-06-28 MED ORDER — DILTIAZEM HCL 100 MG IV SOLR
5.0000 mg/h | INTRAVENOUS | Status: DC
  Administered 2013-06-28: 5 mg/h via INTRAVENOUS

## 2013-06-28 MED ORDER — DILTIAZEM HCL 25 MG/5ML IV SOLN
10.0000 mg | Freq: Once | INTRAVENOUS | Status: AC
  Administered 2013-06-28: 10 mg via INTRAVENOUS
  Filled 2013-06-28: qty 5

## 2013-06-28 MED ORDER — LORATADINE 10 MG PO TABS
10.0000 mg | ORAL_TABLET | Freq: Every day | ORAL | Status: DC
  Administered 2013-06-29 – 2013-07-02 (×4): 10 mg via ORAL
  Filled 2013-06-28 (×4): qty 1

## 2013-06-28 MED ORDER — ADULT MULTIVITAMIN W/MINERALS CH
1.0000 | ORAL_TABLET | Freq: Every day | ORAL | Status: DC
  Administered 2013-06-29 – 2013-07-02 (×4): 1 via ORAL
  Filled 2013-06-28 (×4): qty 1

## 2013-06-28 MED ORDER — AMLODIPINE BESYLATE 5 MG PO TABS
5.0000 mg | ORAL_TABLET | Freq: Every day | ORAL | Status: DC
  Administered 2013-06-28 – 2013-06-29 (×2): 5 mg via ORAL
  Filled 2013-06-28 (×3): qty 1

## 2013-06-28 MED ORDER — HEPARIN SODIUM (PORCINE) 5000 UNIT/ML IJ SOLN
5000.0000 [IU] | Freq: Three times a day (TID) | INTRAMUSCULAR | Status: DC
  Administered 2013-06-28 – 2013-07-02 (×11): 5000 [IU] via SUBCUTANEOUS
  Filled 2013-06-28 (×15): qty 1

## 2013-06-28 MED ORDER — OMEGA-3 FATTY ACIDS 1000 MG PO CAPS: 2.0000 g | ORAL_CAPSULE | Freq: Every day | ORAL | Status: DC

## 2013-06-28 MED ORDER — SODIUM CHLORIDE 0.9 % IV BOLUS (SEPSIS)
500.0000 mL | Freq: Once | INTRAVENOUS | Status: AC
Start: 2013-06-28 — End: 2013-06-28
  Administered 2013-06-28: 500 mL via INTRAVENOUS

## 2013-06-28 MED ORDER — CALCIUM CARBONATE-VITAMIN D 500-200 MG-UNIT PO TABS
1.0000 | ORAL_TABLET | Freq: Every day | ORAL | Status: DC
  Administered 2013-06-29 – 2013-07-02 (×4): 1 via ORAL
  Filled 2013-06-28 (×7): qty 1

## 2013-06-28 MED ORDER — SODIUM CHLORIDE 0.9 % IV SOLN: INTRAVENOUS | Status: DC

## 2013-06-28 MED ORDER — LISINOPRIL 10 MG PO TABS
10.0000 mg | ORAL_TABLET | Freq: Every day | ORAL | Status: DC
  Administered 2013-06-28 – 2013-06-29 (×2): 10 mg via ORAL
  Filled 2013-06-28 (×3): qty 1

## 2013-06-28 NOTE — ED Notes (Signed)
Daughter at bedside and states that patient fell this morning in her kitchen. She thinks pt may have hit her right side. Denies hitting her head. Pt has recently been c/o of roaring in her ears and has had her blood pressure medication adjusted x 2. Most recently increased BP med yesterday. Today daughter took her blood pressure and it was low per her norm. Pt does not have her hearing aids at this time.

## 2013-06-28 NOTE — ED Provider Notes (Signed)
CSN: [id number]     Arrival date & time 06/28/13  1242 History   First MD Initiated Contact with Patient 06/28/13 1423     Chief Complaint  Patient presents with  . Fall     (Consider location/radiation/quality/duration/timing/severity/associated sxs/prior Treatment) HPI Mrs. Buxton is a 80 year old woman who is very hard of hearing and the history is supplemented by her daughter. She lives alone and this morning after getting up at approximately 845 became very lightheaded and diaphoretic while standing in her kitchen. She subsequently had a passing out episode which he thinks was a very short period of time and awoke on the ground. She struck the right side of her chest and back. She has pain secondary to this. She is able to get back up and called her daughter who then brought her in to be evaluated. She has had an increase in her lisinopril recently. She has been hypertensive and has recorded blood pressures, however after her fall this morning she recorded a blood pressure of 80/47. She had not had anything to eat or drink prior to this but after the fall drink her coffee. She denies any pain prior to the fall but is now complaining of pain in her right upper back and her right hip area. She has been walking without difficulty.  Past Medical History  Diagnosis Date  . Hypertension   . Lung nodule     CT of chest 2007-left loewr lobar mass- evaluated by pulmonary- patient refused further work up  . Tobacco abuse   . Vertigo   . Abdominal pain, unspecified site 08/17/2012  . Other and unspecified hyperlipidemia 08/17/2012  . Loss of weight 01/19/2013  . Allergic state 06/22/2013  . Allergy   . Osteoporosis    Past Surgical History  Procedure Laterality Date  . Cholecystectomy    . Cesarean section    . Hernia repair    . Abdominal hysterectomy     Family History  Problem Relation Age of Onset  . Heart disease Mother   . Hypertension Mother   . Diabetes Mother   . Diabetes      . Cancer      lung- in distant releatives  . Cancer Brother    History  Substance Use Topics  . Smoking status: Current Every Day Smoker -- 0.25 packs/day for 60 years  . Smokeless tobacco: Never Used  . Alcohol Use: No   OB History   Grav Para Term Preterm Abortions TAB SAB Ect Mult Living                 Review of Systems  All other systems reviewed and are negative.      Allergies  Ciprofloxacin; Fexofenadine; and Penicillins  Home Medications   Current Outpatient Rx  Name  Route  Sig  Dispense  Refill  . amLODipine (NORVASC) 5 MG tablet   Oral   Take 1 tablet (5 mg total) by mouth daily.   30 tablet   1   . calcium-vitamin D (OSCAL WITH D 500-200) 500-200 MG-UNIT per tablet   Oral   Take 1 tablet by mouth.           . fexofenadine (ALLEGRA) 180 MG tablet   Oral   Take 1 tablet (180 mg total) by mouth daily.   30 tablet   5   . fish oil-omega-3 fatty acids 1000 MG capsule   Oral   Take 2 g by mouth daily.         Marland Kitchen  fluticasone (FLONASE) 50 MCG/ACT nasal spray   Each Nare   Place 2 sprays into both nostrils daily.   16 g   6   . lisinopril (PRINIVIL,ZESTRIL) 10 MG tablet   Oral   Take 1 tablet (10 mg total) by mouth daily.   90 tablet   1   . metoprolol succinate (TOPROL-XL) 25 MG 24 hr tablet      TAKE 1 TABLET (25 MG TOTAL) BY MOUTH DAILY.   90 tablet   1   . Multiple Vitamin (MULTIVITAMIN) tablet   Oral   Take 1 tablet by mouth daily.           Marland Kitchen neomycin-polymyxin-hydrocortisone (CORTISPORIN) otic solution   Both Ears   Place 4 drops into both ears at bedtime as needed (x 5 days as needed for itchy, pain, discharge from ears.).   10 mL   1   . EXPIRED: nitroGLYCERIN (NITROSTAT) 0.4 MG SL tablet   Sublingual   Place 1 tablet (0.4 mg total) under the tongue every 5 (five) minutes as needed for chest pain.   10 tablet   1   . Saline (SODIUM CHLORIDE) 0.65 % SOLN   Nasal   by Nasal route. 2 sprays each nostril once daily  as needed           BP 126/52  Pulse 73  Temp(Src) 97.5 F (36.4 C) (Oral)  Resp 20  SpO2 97% Physical Exam  Nursing note and vitals reviewed. Constitutional: She is oriented to person, place, and time. She appears well-developed and well-nourished.  HENT:  Head: Normocephalic and atraumatic.  Right Ear: External ear normal.  Left Ear: External ear normal.  Nose: Nose normal.  Mouth/Throat: Oropharynx is clear and moist.  Eyes: Conjunctivae and EOM are normal. Pupils are equal, round, and reactive to light.  Neck: Normal range of motion. Neck supple.  Cardiovascular: Intact distal pulses.  An irregularly irregular rhythm present. Tachycardia present.   Pulmonary/Chest: Effort normal and breath sounds normal.    Abdominal: Soft. Bowel sounds are normal.  Musculoskeletal: Normal range of motion.       Arms: Neurological: She is alert and oriented to person, place, and time. She has normal strength and normal reflexes. A cranial nerve deficit is present. She displays a negative Romberg sign. GCS eye subscore is 4. GCS verbal subscore is 5. GCS motor subscore is 6.    ED Course  Procedures (including critical care time) Labs Review Labs Reviewed  CBC WITH DIFFERENTIAL  COMPREHENSIVE METABOLIC PANEL  TROPONIN I   Imaging Review Dg Ribs Unilateral W/chest Right  06/28/2013   CLINICAL DATA:  Recent fall  EXAM: RIGHT RIBS AND CHEST - 3+ VIEW  COMPARISON:  05/19/2008  FINDINGS: The lungs are well aerated bilaterally. Multiple nodular densities are again identified and slightly increased when compare with the prior exam. Correlation to clinical history is recommended. No pneumothorax is noted. There are mildly displaced fractures of the seventh eighth and ninth ribs on the right posteriorly. No other focal abnormality is seen.  IMPRESSION: Right rib fractures as described.   Electronically Signed   By: Inez Catalina M.D.   On: 06/28/2013 13:37   Dg Hip Complete Right  06/28/2013    CLINICAL DATA:  Fall with right hip pain  EXAM: RIGHT HIP - COMPLETE 2+ VIEW  COMPARISON:  None.  FINDINGS: The pelvic ring is intact. No acute fracture or dislocation is noted. No gross soft tissue abnormality is seen.  IMPRESSION: No acute abnormality noted.   Electronically Signed   By: Inez Catalina M.D.   On: 06/28/2013 13:31     EKG Interpretation None      Date: 06/28/2013  Rate: 138  Rhythm: atrial fibrillation  QRS Axis: left  Intervals: atrial fib  ST/T Wave abnormalities: ST elevations anteriorly, lateral st depression  Conduction Disutrbances:none  Narrative Interpretation:   Old EKG Reviewed: atrial fibrillation is new and lateral st depression and anterior st elevation more pronounced.    MDM   Final diagnoses:  None    New onset atrial fibrillation and syncope.  Patient having iv fluids started, labs pending, and iv cardizem ordered.  She is hemodynamically stable.    I have discussed with Dr. Rogene Houston and he will follow up above and arrange for admission.   1- syncope- likely secondary to a fib rvr- check troponin and follow up hr after cardizem and fluid bolus 2-contusion and pain secondary to fall from 1- radiographs to be checked 3-other labs checked.   Shaune Pollack, MD 06/28/13 [phone]

## 2013-06-28 NOTE — H&P (Signed)
[facility] History and Physical  Beth [name] GLO:[id number] DOB: 07/31/1923 DOA: 06/28/2013  Referring physician: EDP PCP: Penni Homans, MD   Chief Complaint: Fall   HPI: Beth [name] is a 80 y.o. female who presents to the ED at [facility] after a syncopal episode that occurred at home this morning.  Episode occurred while standing in the kitchen.  She began to feel lightheaded and diaphoretic, and subsequently had a syncopal episode and fall in her kitchen.  She thinks she was unconscious for a "very short" period of time.  After waking back up she was able to stand back up and call her daughter.  Daughter brought patient to ED to be evaluated.  Has h/o HTN, however BP after fall was 80/47, she recently had lisinopril dose increased.  In the ED she was found to be in A.Fib with RVR.  RVR improved with cardizem gtt, she was transported to [facility].  On the way over she converted back to NSR.  She does not have a history of A.Fib in the past.  Review of Systems: Systems reviewed.  As above, otherwise negative  Past Medical History  Diagnosis Date  . Hypertension   . Lung nodule     CT of chest 2007-left loewr lobar mass- evaluated by pulmonary- patient refused further work up  . Tobacco abuse   . Vertigo   . Abdominal pain, unspecified site 08/17/2012  . Other and unspecified hyperlipidemia 08/17/2012  . Loss of weight 01/19/2013  . Allergic state 06/22/2013  . Allergy   . Osteoporosis    Past Surgical History  Procedure Laterality Date  . Cholecystectomy    . Cesarean section    . Hernia repair    . Abdominal hysterectomy     Social History:  reports that she has been smoking.  She has never used smokeless tobacco. She reports that she does not drink alcohol or use illicit drugs.  Allergies  Allergen Reactions  . Ciprofloxacin Other (See Comments)    myalgias  . Fexofenadine Other (See Comments)    unknown  . Penicillins Other (See Comments)    Family History   Problem Relation Age of Onset  . Heart disease Mother   . Hypertension Mother   . Diabetes Mother   . Diabetes    . Cancer      lung- in distant releatives  . Cancer Brother      Prior to Admission medications   Medication Sig Start Date End Date Taking? Authorizing Provider  amLODipine (NORVASC) 5 MG tablet Take 1 tablet (5 mg total) by mouth daily. 06/27/13  Yes Mosie Lukes, MD  calcium-vitamin D (OSCAL WITH D 500-200) 500-200 MG-UNIT per tablet Take 1 tablet by mouth daily with breakfast.    Yes Historical Provider, MD  cetirizine (ZYRTEC) 10 MG tablet Take 10 mg by mouth every morning.   Yes Historical Provider, MD  fish oil-omega-3 fatty acids 1000 MG capsule Take 2 g by mouth daily.   Yes Historical Provider, MD  lisinopril (PRINIVIL,ZESTRIL) 10 MG tablet Take 1 tablet (10 mg total) by mouth daily. 04/09/13  Yes Mosie Lukes, MD  metoprolol succinate (TOPROL-XL) 25 MG 24 hr tablet Take 25 mg by mouth daily.   Yes Historical Provider, MD  Multiple Vitamin (MULTIVITAMIN) tablet Take 1 tablet by mouth daily.     Yes Historical Provider, MD  neomycin-polymyxin-hydrocortisone (CORTISPORIN) otic solution Place 4 drops into both ears at bedtime as needed (x 5 days as needed  for itchy, pain, discharge from ears.). 06/17/13  Yes Mosie Lukes, MD  Saline (SODIUM CHLORIDE) 0.65 % SOLN by Nasal route. 2 sprays each nostril once daily as needed    Yes Historical Provider, MD   Physical Exam: Filed Vitals:   06/28/13 2100  BP:   Pulse: 73  Temp: 98 F (36.7 C)  Resp: 21    BP 124/39  Pulse 73  Temp(Src) 98 F (36.7 C) (Oral)  Resp 21  Ht 5\' 1"  (1.549 m)  Wt 46.9 kg (103 lb 6.3 oz)  BMI 19.55 kg/m2  SpO2 95%  General Appearance:    Alert, oriented, no distress, appears stated age  Head:    Normocephalic, atraumatic  Eyes:    PERRL, EOMI, sclera non-icteric        Nose:   Nares without drainage or epistaxis. Mucosa, turbinates normal  Throat:   Moist mucous membranes.  Oropharynx without erythema or exudate.  Neck:   Supple. No carotid bruits.  No thyromegaly.  No lymphadenopathy.   Back:     No CVA tenderness, no spinal tenderness  Lungs:     Clear to auscultation bilaterally, without wheezes, rhonchi or rales  Chest wall:    No tenderness to palpitation  Heart:    Regular rate and rhythm without murmurs, gallops, rubs  Abdomen:     Soft, non-tender, nondistended, normal bowel sounds, no organomegaly  Genitalia:    deferred  Rectal:    deferred  Extremities:   No clubbing, cyanosis or edema.  Pulses:   2+ and symmetric all extremities  Skin:   Skin color, texture, turgor normal, no rashes or lesions  Lymph nodes:   Cervical, supraclavicular, and axillary nodes normal  Neurologic:   CNII-XII intact. Normal strength, sensation and reflexes      throughout    Labs on Admission:  Basic Metabolic Panel:  Recent Labs Lab 06/28/13 1626  NA 143  K 3.9  CL 105  CO2 24  GLUCOSE 145*  BUN 14  CREATININE 0.60  CALCIUM 9.3   Liver Function Tests:  Recent Labs Lab 06/28/13 1626  AST 21  ALT 12  ALKPHOS 57  BILITOT 0.4  PROT 7.0  ALBUMIN 3.7   No results found for this basename: LIPASE, AMYLASE,  in the last 168 hours No results found for this basename: AMMONIA,  in the last 168 hours CBC:  Recent Labs Lab 06/28/13 1530  WBC 13.0*  NEUTROABS 11.5*  HGB 14.7  HCT 42.9  MCV 90.3  PLT 241   Cardiac Enzymes:  Recent Labs Lab 06/28/13 1626  TROPONINI <0.30    BNP (last 3 results) No results found for this basename: PROBNP,  in the last 8760 hours CBG: No results found for this basename: GLUCAP,  in the last 168 hours  Radiological Exams on Admission: Dg Ribs Unilateral W/chest Right  06/28/2013   CLINICAL DATA:  Recent fall  EXAM: RIGHT RIBS AND CHEST - 3+ VIEW  COMPARISON:  05/19/2008  FINDINGS: The lungs are well aerated bilaterally. Multiple nodular densities are again identified and slightly increased when compare with the  prior exam. Correlation to clinical history is recommended. No pneumothorax is noted. There are mildly displaced fractures of the seventh eighth and ninth ribs on the right posteriorly. No other focal abnormality is seen.  IMPRESSION: Right rib fractures as described.   Electronically Signed   By: Inez Catalina M.D.   On: 06/28/2013 13:37   Dg Lumbar Spine Complete  06/28/2013   CLINICAL DATA:  Pain post trauma  EXAM: LUMBAR SPINE - COMPLETE 4+ VIEW  COMPARISON:  None.  FINDINGS: Frontal, lateral, spot lumbosacral lateral, and bilateral oblique views were obtained. There are 5 non-rib-bearing lumbar type vertebral bodies. There is dextrorotoscoliosis. There is no fracture or spondylolisthesis. There is moderately severe disc space narrowing in all levels. There is facet arthropathy at all levels bilaterally. There is extensive atherosclerotic change in the aorta and iliac arteries.  IMPRESSION: Scoliosis and multilevel osteoarthritic change. No fracture or spondylolisthesis. Extensive atherosclerotic change in the aorta and iliac arteries.   Electronically Signed   By: Lowella Grip M.D.   On: 06/28/2013 16:04   Dg Hip Complete Right  06/28/2013   CLINICAL DATA:  Fall with right hip pain  EXAM: RIGHT HIP - COMPLETE 2+ VIEW  COMPARISON:  None.  FINDINGS: The pelvic ring is intact. No acute fracture or dislocation is noted. No gross soft tissue abnormality is seen.  IMPRESSION: No acute abnormality noted.   Electronically Signed   By: Inez Catalina M.D.   On: 06/28/2013 13:31    EKG: Independently reviewed.  Assessment/Plan Active Problems:   Aortic insufficiency   Syncope   Atrial fibrillation with RVR   1. Syncope - checking cardiac enzymes, her EKG findings of anterior ST elevation and lateral ST depression appear old and are seen on an EKG in 2013.  2d echo ordered, especially given her history of aortic insufficiency.  Most likely however, what happened is the patient went into new onset A.Fib,  this combined with her aortic insufficiency caused a tachycardia mediated drop in cardiac output and blood pressure (inability of LV to fill due to rate and A.Fib) which in turn led to her syncopal episode.  A.Fib was being rate controlled with cardizem, but patient converted back to NSR.  SBP now in the 160s, so I dont plan on holding any of her home BP meds at this time.  Likely requires cardiology evaluation in AM.    Code Status: Full Code  Family Communication: No family in room Disposition Plan: Admit to inpatient   Time spent: 78 min  Sravya Grissom M. [facility] Pager [phone]  If 7AM-7PM, please contact the day team taking care of the patient [email or web address] Password [facility] 06/28/2013, 9:36 PM

## 2013-06-28 NOTE — ED Notes (Signed)
Attempted PIV and labs x 2. Unsuccessful.

## 2013-06-28 NOTE — ED Notes (Signed)
Fall this am.  Reports right rib and right hip pain.

## 2013-06-28 NOTE — ED Notes (Signed)
Per lab, specimen previously collected was hemolyzed.

## 2013-06-28 NOTE — ED Provider Notes (Signed)
Patient turned over to me from Dr. Jeanell Sparrow. Patient's labs all back. Patient still with the heart rate occasionally up into the low 100s but mostly now to the 80s and 90s with the Cardizem. Patient had syncopal episode will require admission for cardiac monitoring period with a fall with a syncopal episode patient injured her ribs on the right side seventh eighth and ninth. Patient in no acute distress from a respiratory standpoint. Patient did not hit her head or injure her head. Labs otherwise are negative. We'll discuss with hospitalist about admission to [facility].  In addition patient without past medical history of atrial fib. Patient does have a history of hypertension.   Results for orders placed during the hospital encounter of 06/28/13  CBC WITH DIFFERENTIAL      Result Value Ref Range   WBC 13.0 (*) 4.0 - 10.5 K/uL   RBC 4.75  3.87 - 5.11 MIL/uL   Hemoglobin 14.7  12.0 - 15.0 g/dL   HCT 42.9  36.0 - 46.0 %   MCV 90.3  78.0 - 100.0 fL   MCH 30.9  26.0 - 34.0 pg   MCHC 34.3  30.0 - 36.0 g/dL   RDW 13.3  11.5 - 15.5 %   Platelets 241  150 - 400 K/uL   Neutrophils Relative % 88 (*) 43 - 77 %   Neutro Abs 11.5 (*) 1.7 - 7.7 K/uL   Lymphocytes Relative 6 (*) 12 - 46 %   Lymphs Abs 0.8  0.7 - 4.0 K/uL   Monocytes Relative 5  3 - 12 %   Monocytes Absolute 0.7  0.1 - 1.0 K/uL   Eosinophils Relative 0  0 - 5 %   Eosinophils Absolute 0.0  0.0 - 0.7 K/uL   Basophils Relative 0  0 - 1 %   Basophils Absolute 0.0  0.0 - 0.1 K/uL  COMPREHENSIVE METABOLIC PANEL      Result Value Ref Range   Sodium 143  137 - 147 mEq/L   Potassium 3.9  3.7 - 5.3 mEq/L   Chloride 105  96 - 112 mEq/L   CO2 24  19 - 32 mEq/L   Glucose, Bld 145 (*) 70 - 99 mg/dL   BUN 14  6 - 23 mg/dL   Creatinine, Ser 0.60  0.50 - 1.10 mg/dL   Calcium 9.3  8.4 - 10.5 mg/dL   Total Protein 7.0  6.0 - 8.3 g/dL   Albumin 3.7  3.5 - 5.2 g/dL   AST 21  0 - 37 U/L   ALT 12  0 - 35 U/L   Alkaline Phosphatase 57  39 - 117 U/L   Total  Bilirubin 0.4  0.3 - 1.2 mg/dL   GFR calc non Af Amer 80 (*) >90 mL/min   GFR calc Af Amer >90  >90 mL/min  PROTIME-INR      Result Value Ref Range   Prothrombin Time 13.6  11.6 - 15.2 seconds   INR 1.06  0.00 - 1.49  TROPONIN I      Result Value Ref Range   Troponin I <0.30  <0.30 ng/mL   Dg Ribs Unilateral W/chest Right  06/28/2013   CLINICAL DATA:  Recent fall  EXAM: RIGHT RIBS AND CHEST - 3+ VIEW  COMPARISON:  05/19/2008  FINDINGS: The lungs are well aerated bilaterally. Multiple nodular densities are again identified and slightly increased when compare with the prior exam. Correlation to clinical history is recommended. No pneumothorax is noted. There are  mildly displaced fractures of the seventh eighth and ninth ribs on the right posteriorly. No other focal abnormality is seen.  IMPRESSION: Right rib fractures as described.   Electronically Signed   By: Inez Catalina M.D.   On: 06/28/2013 13:37   Dg Lumbar Spine Complete  06/28/2013   CLINICAL DATA:  Pain post trauma  EXAM: LUMBAR SPINE - COMPLETE 4+ VIEW  COMPARISON:  None.  FINDINGS: Frontal, lateral, spot lumbosacral lateral, and bilateral oblique views were obtained. There are 5 non-rib-bearing lumbar type vertebral bodies. There is dextrorotoscoliosis. There is no fracture or spondylolisthesis. There is moderately severe disc space narrowing in all levels. There is facet arthropathy at all levels bilaterally. There is extensive atherosclerotic change in the aorta and iliac arteries.  IMPRESSION: Scoliosis and multilevel osteoarthritic change. No fracture or spondylolisthesis. Extensive atherosclerotic change in the aorta and iliac arteries.   Electronically Signed   By: Lowella Grip M.D.   On: 06/28/2013 16:04   Dg Hip Complete Right  06/28/2013   CLINICAL DATA:  Fall with right hip pain  EXAM: RIGHT HIP - COMPLETE 2+ VIEW  COMPARISON:  None.  FINDINGS: The pelvic ring is intact. No acute fracture or dislocation is noted. No gross soft  tissue abnormality is seen.  IMPRESSION: No acute abnormality noted.   Electronically Signed   By: Inez Catalina M.D.   On: 06/28/2013 13:31      Mervin Kung, MD 06/28/13 [phone]

## 2013-06-29 DIAGNOSIS — I10 Essential (primary) hypertension: Secondary | ICD-10-CM

## 2013-06-29 LAB — TROPONIN I: Troponin I: <0.3 ng/mL (ref <0.30)

## 2013-06-29 LAB — CBC
HCT: 36.3 % (ref 36.0–46.0)
HEMOGLOBIN: 12.6 g/dL (ref 12.0–15.0)
MCH: 30.9 pg (ref 26.0–34.0)
MCHC: 34.7 g/dL (ref 30.0–36.0)
MCV: 89 fL (ref 78.0–100.0)
Platelets: 209 10*3/uL (ref 150–400)
RBC: 4.08 MIL/uL (ref 3.87–5.11)
RDW: 13.3 % (ref 11.5–15.5)
WBC: 7.1 10*3/uL (ref 4.0–10.5)

## 2013-06-29 LAB — BASIC METABOLIC PANEL
BUN: 9 mg/dL (ref 6–23)
CO2: 21 mEq/L (ref 19–32)
CREATININE: 0.55 mg/dL (ref 0.50–1.10)
Calcium: 8.8 mg/dL (ref 8.4–10.5)
Chloride: 105 mEq/L (ref 96–112)
GFR calc non Af Amer: 82 mL/min — ABNORMAL LOW (ref >90)
GLUCOSE: 94 mg/dL (ref 70–99)
POTASSIUM: 3.5 meq/L — AB (ref 3.7–5.3)
Sodium: 142 mEq/L (ref 137–147)

## 2013-06-29 MED ORDER — ENSURE COMPLETE PO LIQD
237.0000 mL | Freq: Two times a day (BID) | ORAL | Status: DC
  Administered 2013-06-29 – 2013-07-02 (×6): 237 mL via ORAL

## 2013-06-29 MED ORDER — ACETAMINOPHEN 325 MG PO TABS
650.0000 mg | ORAL_TABLET | Freq: Four times a day (QID) | ORAL | Status: DC | PRN
  Administered 2013-07-02: 650 mg via ORAL
  Filled 2013-06-29: qty 2

## 2013-06-29 MED ORDER — ASPIRIN 81 MG PO CHEW
81.0000 mg | CHEWABLE_TABLET | Freq: Every day | ORAL | Status: DC
  Administered 2013-06-29 – 2013-07-02 (×4): 81 mg via ORAL
  Filled 2013-06-29 (×3): qty 1

## 2013-06-29 MED ORDER — TRAMADOL HCL 50 MG PO TABS: 50.0000 mg | ORAL_TABLET | Freq: Four times a day (QID) | ORAL | Status: DC | PRN

## 2013-06-29 NOTE — Progress Notes (Signed)
V/s lying- bp159/81, hr-72, r-18 Sitting after 5 min, bp- 138/51, hr-80 r-19 Standing-157/63, hr-91,r-30 Denied any feeling of dizziness.

## 2013-06-29 NOTE — Progress Notes (Signed)
Transferred to 2west room 1 by wheelchair, stable, belongings with pt, report given to Rn.

## 2013-06-29 NOTE — Progress Notes (Signed)
[facility] PROGRESS NOTE  HARUKA KOWALESKI XBJ:[id number] DOB: 1923/06/18 DOA: 06/28/2013 PCP: Penni Homans, MD  Assessment/Plan: Atrial Fibrillation with RVR -Spontaneous conversion to sinus rhythm -Discontinue diltiazem drip -Long discussion with patient's daughter regarding risks and benefits of anticoagulation versus aspirin -CHADSVASc= 5 -Daughter expressed concern regarding the patient's fall risk and hesitant for anticoagulation--> start aspirin -Transferred to telemetry unit -Echocardiogram -TSH Syncope -Check orthostatics -Likely related to tachyarrhythmia -09/08/2011 LexiScan negative for reversible ischemia, EF 65% Hypertension -Continue amlodipine, metoprolol succinate Deconditioning -PT evaluation -Patient will ultimately need skilled nursing facility R-rib fractures -Secondary to fall due to syncope -Pain control  Family Communication:   Daughter updated on telephone; total time 35 min; greater than 50% of time spent counseling and coordinating care Disposition Plan:   Home when medically stable       Procedures/Studies: Dg Ribs Unilateral W/chest Right  06/28/2013   CLINICAL DATA:  Recent fall  EXAM: RIGHT RIBS AND CHEST - 3+ VIEW  COMPARISON:  05/19/2008  FINDINGS: The lungs are well aerated bilaterally. Multiple nodular densities are again identified and slightly increased when compare with the prior exam. Correlation to clinical history is recommended. No pneumothorax is noted. There are mildly displaced fractures of the seventh eighth and ninth ribs on the right posteriorly. No other focal abnormality is seen.  IMPRESSION: Right rib fractures as described.   Electronically Signed   By: Inez Catalina M.D.   On: 06/28/2013 13:37   Dg Lumbar Spine Complete  06/28/2013   CLINICAL DATA:  Pain post trauma  EXAM: LUMBAR SPINE - COMPLETE 4+ VIEW  COMPARISON:  None.  FINDINGS: Frontal, lateral, spot lumbosacral lateral, and bilateral oblique views were  obtained. There are 5 non-rib-bearing lumbar type vertebral bodies. There is dextrorotoscoliosis. There is no fracture or spondylolisthesis. There is moderately severe disc space narrowing in all levels. There is facet arthropathy at all levels bilaterally. There is extensive atherosclerotic change in the aorta and iliac arteries.  IMPRESSION: Scoliosis and multilevel osteoarthritic change. No fracture or spondylolisthesis. Extensive atherosclerotic change in the aorta and iliac arteries.   Electronically Signed   By: Lowella Grip M.D.   On: 06/28/2013 16:04   Dg Hip Complete Right  06/28/2013   CLINICAL DATA:  Fall with right hip pain  EXAM: RIGHT HIP - COMPLETE 2+ VIEW  COMPARISON:  None.  FINDINGS: The pelvic ring is intact. No acute fracture or dislocation is noted. No gross soft tissue abnormality is seen.  IMPRESSION: No acute abnormality noted.   Electronically Signed   By: Inez Catalina M.D.   On: 06/28/2013 13:31         Subjective: Patient complains of right rib pain. Denies any dizziness, shortness breath, nausea, vomiting, diarrhea, vomiting, dysuria. No headaches. No fevers chills.  Objective: Filed Vitals:   06/29/13 0400 06/29/13 0500 06/29/13 0600 06/29/13 0751  BP: 116/72 138/39 131/42 144/60  Pulse:    72  Temp: 98.3 F (36.8 C)   98.8 F (37.1 C)  TempSrc: Oral   Oral  Resp: 25 20 19 18   Height:      Weight:      SpO2:    93%    Intake/Output Summary (Last 24 hours) at 06/29/13 0919 Last data filed at 06/29/13 0300  Gross per 24 hour  Intake 434.91 ml  Output    600 ml  Net -165.09 ml   Weight change:  Exam:   General:  Pt is alert, follows commands appropriately, not  in acute distress  HEENT: No icterus, No thrush,  [place]/AT  Cardiovascular: RRR, S1/S2, no rubs, no gallops  Respiratory: Basilar crackles. Heart clear to auscultation. No wheezing  Abdomen: Soft/+BS, non tender, non distended, no guarding  Extremities: No edema, No lymphangitis, No  petechiae, No rashes, no synovitis  Data Reviewed: Basic Metabolic Panel:  Recent Labs Lab 06/28/13 1626 06/29/13 0350  NA 143 142  K 3.9 3.5*  CL 105 105  CO2 24 21  GLUCOSE 145* 94  BUN 14 9  CREATININE 0.60 0.55  CALCIUM 9.3 8.8   Liver Function Tests:  Recent Labs Lab 06/28/13 1626  AST 21  ALT 12  ALKPHOS 57  BILITOT 0.4  PROT 7.0  ALBUMIN 3.7   No results found for this basename: LIPASE, AMYLASE,  in the last 168 hours No results found for this basename: AMMONIA,  in the last 168 hours CBC:  Recent Labs Lab 06/28/13 1530 06/29/13 0350  WBC 13.0* 7.1  NEUTROABS 11.5*  --   HGB 14.7 12.6  HCT 42.9 36.3  MCV 90.3 89.0  PLT 241 209   Cardiac Enzymes:  Recent Labs Lab 06/28/13 1626 06/28/13 2330 06/29/13 0350  TROPONINI <0.30 <0.30 <0.30   BNP: No components found with this basename: POCBNP,  CBG: No results found for this basename: GLUCAP,  in the last 168 hours  Recent Results (from the past 240 hour(s))  MRSA PCR SCREENING     Status: None   Collection Time    06/28/13  9:00 PM      Result Value Ref Range Status   MRSA by PCR NEGATIVE  NEGATIVE Final   Comment:            The GeneXpert MRSA Assay (FDA     approved for NASAL specimens     only), is one component of a     comprehensive MRSA colonization     surveillance program. It is not     intended to diagnose MRSA     infection nor to guide or     monitor treatment for     MRSA infections.     Scheduled Meds: . amLODipine  5 mg Oral Daily  . aspirin  81 mg Oral Daily  . calcium-vitamin D  1 tablet Oral Q breakfast  . heparin  5,000 Units Subcutaneous 3 times per day  . lisinopril  10 mg Oral Daily  . loratadine  10 mg Oral Daily  . metoprolol succinate  25 mg Oral Daily  . multivitamin with minerals  1 tablet Oral Daily  . omega-3 acid ethyl esters  2 g Oral BID  . sodium chloride  3 mL Intravenous Q12H   Continuous Infusions:     Jadan Hinojos, DO  [facility] Pager [phone]  If 7PM-7AM, please contact night-coverage [email or web address] Password [email or web address] 06/29/2013, 9:19 AM   LOS: 1 day

## 2013-06-29 NOTE — Progress Notes (Signed)
INITIAL NUTRITION ASSESSMENT  DOCUMENTATION CODES Per approved criteria  -Severe malnutrition in the context of chronic illness   INTERVENTION: Ensure Complete po BID, each supplement provides 350 kcal and 13 grams of protein RD to follow for nutrition care plan  NUTRITION DIAGNOSIS: Inadequate oral intake related to limited appetite as evidenced by meal tray observation  Goal: Pt to meet >/= 90% of their estimated nutrition needs   Monitor:  PO & supplemental intake, weight, labs, I/O's  Reason for Assessment: Malnutrition Screening Tool Report  80 y.o. female  Admitting Dx: fall  ASSESSMENT: 80 y.o. female who presented to the ED at [facility] after a syncopal episode that occurred at home.  RD spoke with daughter and pt at bedside; pt's daughter reports pt lives alone and eats 3 small meals per day; weight has been stable; lunch meal tray observed on pt's tray table; pt only consumed ~ 25% of meal; + severe muscle and fat loss to upper body; pt amenable to chocolate Ensure Complete supplement -- RD to order.  Nutrition Focused Physical Exam:  Subcutaneous Fat:  Orbital Region: N/A Upper Arm Region: severe depletion Thoracic and Lumbar Region: N/A  Muscle:  Temple Region: WNL Clavicle Bone Region: severe depletion Clavicle and Acromion Bone Region: severe depletion Scapular Bone Region: N/A Dorsal Hand: N/A Patellar Region: N/A Anterior Thigh Region: N/A Posterior Calf Region: N/A  Edema: none  Patient meets criteria for severe malnutrition in the context of chronic illness as evidenced by severe muscle loss and severe subcutaneous fat loss.  Height: Ht Readings from Last 1 Encounters:  06/28/13 5\' 1"  (1.549 m)    Weight: Wt Readings from Last 1 Encounters:  06/28/13 103 lb 6.3 oz (46.9 kg)    Ideal Body Weight: 105 lb  % Ideal Body Weight: 98%  Wt Readings from Last 10 Encounters:  06/28/13 103 lb 6.3 oz (46.9 kg)  06/25/13 108 lb (48.988 kg)  06/17/13  106 lb 1.9 oz (48.136 kg)  01/15/13 109 lb 1.3 oz (49.478 kg)  08/14/12 112 lb 1.3 oz (50.839 kg)  06/18/12 113 lb 1.3 oz (51.293 kg)  02/17/12 114 lb 8 oz (51.937 kg)  10/14/11 113 lb 4 oz (51.37 kg)  09/22/11 113 lb 9.6 oz (51.529 kg)  08/29/11 113 lb (51.256 kg)    Usual Body Weight: 106-109 lb  % Usual Body Weight: 94-97%  BMI:  Body mass index is 19.55 kg/(m^2).  Estimated Nutritional Needs: Kcal: 1200-1400 Protein: 60-70 gm Fluid: >/= 1.5 L  Skin: Intact  Diet Order: Cardiac  EDUCATION NEEDS: -No education needs identified at this time   Intake/Output Summary (Last 24 hours) at 06/29/13 1407 Last data filed at 06/29/13 1300  Gross per 24 hour  Intake 824.91 ml  Output   1100 ml  Net -275.09 ml   Labs:   Recent Labs Lab 06/28/13 1626 06/29/13 0350  NA 143 142  K 3.9 3.5*  CL 105 105  CO2 24 21  BUN 14 9  CREATININE 0.60 0.55  CALCIUM 9.3 8.8  GLUCOSE 145* 94    Scheduled Meds: . amLODipine  5 mg Oral Daily  . aspirin  81 mg Oral Daily  . calcium-vitamin D  1 tablet Oral Q breakfast  . heparin  5,000 Units Subcutaneous 3 times per day  . lisinopril  10 mg Oral Daily  . loratadine  10 mg Oral Daily  . metoprolol succinate  25 mg Oral Daily  . multivitamin with minerals  1 tablet Oral  Daily  . omega-3 acid ethyl esters  2 g Oral BID  . sodium chloride  3 mL Intravenous Q12H    Continuous Infusions:   Past Medical History  Diagnosis Date  . Hypertension   . Lung nodule     CT of chest 2007-left loewr lobar mass- evaluated by pulmonary- patient refused further work up  . Tobacco abuse   . Vertigo   . Abdominal pain, unspecified site 08/17/2012  . Other and unspecified hyperlipidemia 08/17/2012  . Loss of weight 01/19/2013  . Allergic state 06/22/2013  . Allergy   . Osteoporosis     Past Surgical History  Procedure Laterality Date  . Cholecystectomy    . Cesarean section    . Hernia repair    . Abdominal hysterectomy      Arthur Holms, RD, LDN Pager #: [phone] After-Hours Pager #: [phone]

## 2013-06-29 NOTE — Evaluation (Signed)
Physical Therapy Evaluation Patient Details Name: Beth [name] MRN: [id number] DOB: [birth year] Today's Date: 06/29/2013   History of Present Illness  Pt admit with syncope.  Clinical Impression  Pt admitted with above. Pt currently with functional limitations due to the deficits listed below (see PT Problem List). Pt may need SNF prior to d/c home.   Pt will benefit from skilled PT to increase their independence and safety with mobility to allow discharge to the venue listed below.     Follow Up Recommendations SNF;Supervision/Assistance - 24 hour    Equipment Recommendations  Other (comment) (TBA)    Recommendations for Other Services       Precautions / Restrictions Precautions Precautions: Fall Restrictions Weight Bearing Restrictions: No      Mobility  Bed Mobility Overal bed mobility: Needs Assistance Bed Mobility: Supine to Sit     Supine to sit: Independent     General bed mobility comments: Pt sitting EOB on arrival.   Transfers Overall transfer level: Needs assistance Equipment used: 1 person hand held assist Transfers: Sit to/from Stand Sit to Stand: Min assist         General transfer comment: Pt hesitant to stand up due to stating she gets so dizzy.  Encouraged pt to at least stand and step to [facility].  Pt did so and needed min assist as pt unsteady and leaning to her left with incr weight shifted to left side.  It appeared difficult for pt to [facility] her right hip to step up to [facility] but with incr time pt did so.  No LOB noted but was difficult for pt.  Pt refused OOB or ambulation.  HR 68-110 bpm with standing alone.    Ambulation/Gait                Stairs            Wheelchair Mobility    Modified Rankin (Stroke Patients Only)       Balance Overall balance assessment: Needs assistance;History of Falls Sitting-balance support: Single extremity supported;Feet supported Sitting balance-Leahy Scale: Fair   Postural control: Left  lateral lean Standing balance support: Single extremity supported;During functional activity Standing balance-Leahy Scale: Poor Standing balance comment: Pt has to have UE support and min assist to stand                             Pertinent Vitals/Pain HR incr from 68-110 bpm with standing, No pain    Home Living Family/patient expects to be discharged to:: Private residence Living Arrangements: Alone Available Help at Discharge: Family;Available PRN/intermittently (daughter checks on pt) Type of Home: Apartment Home Access: Stairs to enter Entrance Stairs-Rails: Right Entrance Stairs-Number of Steps: 13 to get to mailbox per pt Home Layout: Two level;Other (Comment) (states mailbox on bottom level.) Home Equipment: None      Prior Function Level of Independence: Independent               Hand Dominance        Extremity/Trunk Assessment   Upper Extremity Assessment: Defer to OT evaluation           Lower Extremity Assessment: Generalized weakness         Communication   Communication: No difficulties  Cognition Arousal/Alertness: Awake/alert Behavior During Therapy: WFL for tasks assessed/performed Overall Cognitive Status: No family/caregiver present to determine baseline cognitive functioning  General Comments      Exercises        Assessment/Plan    PT Assessment Patient needs continued PT services  PT Diagnosis Generalized weakness   PT Problem List Decreased activity tolerance;Decreased balance;Decreased mobility;Decreased knowledge of use of DME;Decreased safety awareness;Decreased knowledge of precautions  PT Treatment Interventions DME instruction;Gait training;Therapeutic activities;Functional mobility training;Therapeutic exercise;Patient/family education;Balance training   PT Goals (Current goals can be found in the Care Plan section) Acute Rehab PT Goals Patient Stated Goal: to go home PT Goal  Formulation: With patient Time For Goal Achievement: 07/06/13 Potential to Achieve Goals: Good    Frequency Min 3X/week   Barriers to discharge Decreased caregiver support      Co-evaluation               End of Session Equipment Utilized During Treatment: Gait belt Activity Tolerance: Patient limited by fatigue Patient left: in bed;with call bell/phone within reach Nurse Communication: Mobility status         Time: 7106-2694 PT Time Calculation (min): 17 min   Charges:   PT Evaluation $Initial PT Evaluation Tier I: 1 Procedure PT Treatments $Therapeutic Activity: 8-22 mins   PT G Codes:          INGOLD,Dajahnae Vondra 07/04/13, 11:47 AM [facility] [phone] (pager)

## 2013-06-29 NOTE — Progress Notes (Signed)
Pt received into room 2w01, tele placed on pt, family at bedside, pt stated mild discomfort in her right rib cage from previous fall, will continue to monitor Rickard Rhymes, RN

## 2013-06-30 ENCOUNTER — Other Ambulatory Visit: Payer: Self-pay

## 2013-06-30 DIAGNOSIS — R634 Abnormal weight loss: Secondary | ICD-10-CM

## 2013-06-30 DIAGNOSIS — E43 Unspecified severe protein-calorie malnutrition: Secondary | ICD-10-CM | POA: Diagnosis not present

## 2013-06-30 DIAGNOSIS — S2249XA Multiple fractures of ribs, unspecified side, initial encounter for closed fracture: Secondary | ICD-10-CM | POA: Diagnosis present

## 2013-06-30 DIAGNOSIS — I359 Nonrheumatic aortic valve disorder, unspecified: Secondary | ICD-10-CM | POA: Diagnosis not present

## 2013-06-30 DIAGNOSIS — I369 Nonrheumatic tricuspid valve disorder, unspecified: Secondary | ICD-10-CM

## 2013-06-30 DIAGNOSIS — I4891 Unspecified atrial fibrillation: Secondary | ICD-10-CM | POA: Diagnosis not present

## 2013-06-30 LAB — BASIC METABOLIC PANEL
BUN: 12 mg/dL (ref 6–23)
CALCIUM: 8.8 mg/dL (ref 8.4–10.5)
CO2: 19 mEq/L (ref 19–32)
Chloride: 104 mEq/L (ref 96–112)
Creatinine, Ser: 0.52 mg/dL (ref 0.50–1.10)
GFR, EST NON AFRICAN AMERICAN: 83 mL/min — AB (ref >90)
GLUCOSE: 84 mg/dL (ref 70–99)
Potassium: 3.7 mEq/L (ref 3.7–5.3)
Sodium: 139 mEq/L (ref 137–147)

## 2013-06-30 LAB — TSH: TSH: 3.68 u[IU]/mL (ref 0.350–4.500)

## 2013-06-30 MED ORDER — DILTIAZEM HCL 100 MG IV SOLR
5.0000 mg/h | INTRAVENOUS | Status: DC
  Administered 2013-06-30: 10 mg/h via INTRAVENOUS
  Filled 2013-06-30 (×2): qty 100

## 2013-06-30 MED ORDER — DILTIAZEM LOAD VIA INFUSION
10.0000 mg | Freq: Once | INTRAVENOUS | Status: AC
  Administered 2013-06-30: 10 mg via INTRAVENOUS
  Filled 2013-06-30 (×3): qty 10

## 2013-06-30 NOTE — Progress Notes (Signed)
*  PRELIMINARY RESULTS* Echocardiogram 2D Echocardiogram has been performed.  Leavy Cella 06/30/2013, 10:22 AM

## 2013-06-30 NOTE — Progress Notes (Addendum)
PROGRESS NOTE  Beth [name] QPY:[id number] DOB: 03/09/1924 DOA: 06/28/2013 PCP: Penni Homans, MD  Assessment/Plan: Atrial Fibrillation with RVR  -Spontaneous conversion to sinus rhythm -A.M. 06/30/2013--> patient went back to A. fib with RVR--> restart diltiazem drip  -Continue metoprolol tartrate -Long discussion with patient's daughter regarding risks and benefits of anticoagulation versus aspirin  -CHADSVASc= 5  -Daughter expressed concern regarding the patient's fall risk and hesitant for anticoagulation--> start aspirin  -Transferred to telemetry unit  -Echocardiogram--pending -TSH--3.680 -consult cardiology if the patient does not convert back to sinus rhythm Syncope  -Check orthostatics--negative  -Likely related to tachyarrhythmia  -09/08/2011 LexiScan negative for reversible ischemia, EF 65%  -Troponins negative x3 Hypertension  -Continue metoprolol succinate  -Discontinued amlodipine as diltiazem drip is being started Deconditioning  -PT evaluation  -Patient will ultimately need skilled nursing facility  R-rib fractures  -Secondary to fall due to syncope  -Pain control  Severe Protein Calorie Malnutrition -nutritional supplementation Family Communication: Daughter updated on telephone; total time 35 min; greater than 50% of time spent counseling and coordinating care  Disposition Plan: SNF        Procedures/Studies: Dg Ribs Unilateral W/chest Right  06/28/2013   CLINICAL DATA:  Recent fall  EXAM: RIGHT RIBS AND CHEST - 3+ VIEW  COMPARISON:  05/19/2008  FINDINGS: The lungs are well aerated bilaterally. Multiple nodular densities are again identified and slightly increased when compare with the prior exam. Correlation to clinical history is recommended. No pneumothorax is noted. There are mildly displaced fractures of the seventh eighth and ninth ribs on the right posteriorly. No other focal abnormality is seen.  IMPRESSION: Right rib fractures as  described.   Electronically Signed   By: Inez Catalina M.D.   On: 06/28/2013 13:37   Dg Lumbar Spine Complete  06/28/2013   CLINICAL DATA:  Pain post trauma  EXAM: LUMBAR SPINE - COMPLETE 4+ VIEW  COMPARISON:  None.  FINDINGS: Frontal, lateral, spot lumbosacral lateral, and bilateral oblique views were obtained. There are 5 non-rib-bearing lumbar type vertebral bodies. There is dextrorotoscoliosis. There is no fracture or spondylolisthesis. There is moderately severe disc space narrowing in all levels. There is facet arthropathy at all levels bilaterally. There is extensive atherosclerotic change in the aorta and iliac arteries.  IMPRESSION: Scoliosis and multilevel osteoarthritic change. No fracture or spondylolisthesis. Extensive atherosclerotic change in the aorta and iliac arteries.   Electronically Signed   By: Lowella Grip M.D.   On: 06/28/2013 16:04   Dg Hip Complete Right  06/28/2013   CLINICAL DATA:  Fall with right hip pain  EXAM: RIGHT HIP - COMPLETE 2+ VIEW  COMPARISON:  None.  FINDINGS: The pelvic ring is intact. No acute fracture or dislocation is noted. No gross soft tissue abnormality is seen.  IMPRESSION: No acute abnormality noted.   Electronically Signed   By: Inez Catalina M.D.   On: 06/28/2013 13:31         Subjective: Patient is feeling well surprisingly. Denies any fevers, chills, chest discomfort, shortness breath, dizziness, nausea, vomiting, diarrhea, abdominal pain. No dysuria. She is eating well.   Objective: Filed Vitals:   06/30/13 0757 06/30/13 0830 06/30/13 0837 06/30/13 0845  BP: 128/53 160/77 131/72   Pulse:    130  Temp:      TempSrc:      Resp:      Height:      Weight:      SpO2:  Intake/Output Summary (Last 24 hours) at 06/30/13 0917 Last data filed at 06/30/13 0509  Gross per 24 hour  Intake    630 ml  Output   1200 ml  Net   -570 ml   Weight change:  Exam:   General:  Pt is alert, follows commands appropriately, not in acute  distress  HEENT: No icterus, No thrush, [place]/AT  Cardiovascular: IRRR, S1/S2, no rubs, no gallops  Respiratory: CTA bilaterally, no wheezing, no crackles, no rhonchi  Abdomen: Soft/+BS, non tender, non distended, no guarding  Extremities: No edema, No lymphangitis, No petechiae, No rashes, no synovitis  Data Reviewed: Basic Metabolic Panel:  Recent Labs Lab 06/28/13 1626 06/29/13 0350 06/30/13 0300  NA 143 142 139  K 3.9 3.5* 3.7  CL 105 105 104  CO2 24 21 19   GLUCOSE 145* 94 84  BUN 14 9 12   CREATININE 0.60 0.55 0.52  CALCIUM 9.3 8.8 8.8   Liver Function Tests:  Recent Labs Lab 06/28/13 1626  AST 21  ALT 12  ALKPHOS 57  BILITOT 0.4  PROT 7.0  ALBUMIN 3.7   No results found for this basename: LIPASE, AMYLASE,  in the last 168 hours No results found for this basename: AMMONIA,  in the last 168 hours CBC:  Recent Labs Lab 06/28/13 1530 06/29/13 0350  WBC 13.0* 7.1  NEUTROABS 11.5*  --   HGB 14.7 12.6  HCT 42.9 36.3  MCV 90.3 89.0  PLT 241 209   Cardiac Enzymes:  Recent Labs Lab 06/28/13 1626 06/28/13 2330 06/29/13 0350 06/29/13 1140  TROPONINI <0.30 <0.30 <0.30 <0.30   BNP: No components found with this basename: POCBNP,  CBG: No results found for this basename: GLUCAP,  in the last 168 hours  Recent Results (from the past 240 hour(s))  MRSA PCR SCREENING     Status: None   Collection Time    06/28/13  9:00 PM      Result Value Ref Range Status   MRSA by PCR NEGATIVE  NEGATIVE Final   Comment:            The GeneXpert MRSA Assay (FDA     approved for NASAL specimens     only), is one component of a     comprehensive MRSA colonization     surveillance program. It is not     intended to diagnose MRSA     infection nor to guide or     monitor treatment for     MRSA infections.     Scheduled Meds: . aspirin  81 mg Oral Daily  . calcium-vitamin D  1 tablet Oral Q breakfast  . diltiazem  10 mg Intravenous Once  . feeding supplement  (ENSURE COMPLETE)  237 mL Oral BID BM  . heparin  5,000 Units Subcutaneous 3 times per day  . loratadine  10 mg Oral Daily  . metoprolol succinate  25 mg Oral Daily  . multivitamin with minerals  1 tablet Oral Daily  . omega-3 acid ethyl esters  2 g Oral BID  . sodium chloride  3 mL Intravenous Q12H   Continuous Infusions: . diltiazem (CARDIZEM) infusion 10 mg/hr (06/30/13 0916)     Zackory Pudlo, DO  [facility] Pager [phone]  If 7PM-7AM, please contact night-coverage [email or web address] Password [email or web address] 06/30/2013, 9:17 AM   LOS: 2 days

## 2013-06-30 NOTE — Progress Notes (Signed)
Pt converted to Af-fib RVR, Dr. Carles Collet notified and confirmed via EKG, pt assisted to bed and placed on 2L [place], Vitals taken, orders received from Dr. Carles Collet, will continue to monitor patient Rickard Rhymes, RN

## 2013-07-01 DIAGNOSIS — E43 Unspecified severe protein-calorie malnutrition: Secondary | ICD-10-CM

## 2013-07-01 LAB — BASIC METABOLIC PANEL
BUN: 10 mg/dL (ref 6–23)
CALCIUM: 9 mg/dL (ref 8.4–10.5)
CO2: 20 mEq/L (ref 19–32)
Chloride: 101 mEq/L (ref 96–112)
Creatinine, Ser: 0.47 mg/dL — ABNORMAL LOW (ref 0.50–1.10)
GFR, EST NON AFRICAN AMERICAN: 86 mL/min — AB (ref >90)
GLUCOSE: 108 mg/dL — AB (ref 70–99)
Potassium: 3.7 mEq/L (ref 3.7–5.3)
Sodium: 136 mEq/L — ABNORMAL LOW (ref 137–147)

## 2013-07-01 LAB — MAGNESIUM: Magnesium: 1.9 mg/dL (ref 1.5–2.5)

## 2013-07-01 MED ORDER — DILTIAZEM HCL ER COATED BEADS 240 MG PO CP24
240.0000 mg | ORAL_CAPSULE | Freq: Every day | ORAL | Status: DC
  Administered 2013-07-01 – 2013-07-02 (×2): 240 mg via ORAL
  Filled 2013-07-01 (×2): qty 1

## 2013-07-01 MED ORDER — MAGNESIUM SULFATE 40 MG/ML IJ SOLN
2.0000 g | Freq: Once | INTRAMUSCULAR | Status: AC
  Administered 2013-07-01: 2 g via INTRAVENOUS
  Filled 2013-07-01 (×2): qty 50

## 2013-07-01 MED ORDER — POTASSIUM CHLORIDE CRYS ER 20 MEQ PO TBCR
40.0000 meq | EXTENDED_RELEASE_TABLET | Freq: Once | ORAL | Status: AC
  Administered 2013-07-01: 40 meq via ORAL
  Filled 2013-07-01 (×2): qty 2

## 2013-07-01 NOTE — Progress Notes (Signed)
Clinical Social Work Department BRIEF PSYCHOSOCIAL ASSESSMENT 07/01/2013  Patient:  Beth [name], Beth [name]     Account Number:  [id number]     Admit date:  06/28/2013  Clinical Social Worker:  Megan Salon  Date/Time:  07/01/2013 10:44 AM  Referred by:  Physician  Date Referred:  07/01/2013 Referred for  SNF Placement   Other Referral:   Interview type:  Family Other interview type:   Per MD, social worker should follow up with patient's family member, daughter.    PSYCHOSOCIAL DATA Living Status:  ALONE Admitted from facility:   Level of care:   Primary support name:  Irene Pap Primary support relationship to patient:  CHILD, ADULT Degree of support available:   Good    CURRENT CONCERNS Current Concerns  Post-Acute Placement   Other Concerns:    SOCIAL WORK ASSESSMENT / PLAN Clinical Social Worker received referral for SNF placement at d/c.Per MD, speak to patient's daughter.  CSW called daughter and  introduced self and explained reason for phone call. CSW explained SNF process to patient's daughter. Daughter reported she is agreeable for SNF placement and has had a friend tell her [facility] is a good facility and would like to go with that facility. Patient's daughter expressed her thoughts and stated that she does not think her mom can care for herself at home right now and hopes that rehab helps her mom get stronger to go back home. CSW provided support throughout conversation. Daughter states she just wants what is best for patient.  CSW will complete FL2 for MD's signature and will update patient's daughter.   Assessment/plan status:  Psychosocial Support/Ongoing Assessment of Needs Other assessment/ plan:   Information/referral to community resources:   CSW contact information/snf list provided over the phone    PATIENT'S/FAMILY'S RESPONSE TO PLAN OF CARE: CSW explained that MD thinks discharge may be tomorrow. Patient's daughter states she is okay with  that and wants her mom to get better.       Jeanette Caprice, MSW, [place]

## 2013-07-01 NOTE — Progress Notes (Signed)
Clinical Social Work Department CLINICAL SOCIAL WORK PLACEMENT NOTE 07/01/2013  Patient:  Beth [name], Beth [name]  Account Number:  [id number] Admit date:  06/28/2013  Clinical Social Worker:  Megan Salon  Date/time:  07/01/2013 10:49 AM  Clinical Social Work is seeking post-discharge placement for this patient at the following level of care:   [facility]   (*CSW will update this form in Epic as items are completed)   07/01/2013  Patient/family provided with [place] Department of Clinical Social Work's list of facilities offering this level of care within the geographic area requested by the patient (or if unable, by the patient's family).  07/01/2013  Patient/family informed of their freedom to choose among providers that offer the needed level of care, that participate in Medicare, Medicaid or managed care program needed by the patient, have an available bed and are willing to accept the patient.  07/01/2013  Patient/family informed of [facility]' ownership interest in [facility], as well as of the fact that they are under no obligation to receive care at this facility.  PASARR submitted to EDS on 07/01/2013 PASARR number received from EDS on 07/01/2013  FL2 transmitted to all facilities in geographic area requested by pt/family on  07/01/2013 FL2 transmitted to all facilities within larger geographic area on   Patient informed that his/her managed care company has contracts with or will negotiate with  certain facilities, including the following:     Patient/family informed of bed offers received:  07/01/2013 Patient chooses bed at [facility] Physician recommends and patient chooses bed at    Patient to be transferred to [facility] on   Patient to be transferred to facility by   The following physician request were entered in Epic:   Additional Comments:  Jeanette Caprice, MSW, [place]

## 2013-07-01 NOTE — Consult Note (Signed)
CARDIOLOGY CONSULT NOTE  Patient ID: Beth [name] MRN: [id number] DOB/AGE: 1923-05-17 80 y.o.  Admit date: 06/28/2013 Primary Physician: Dr. Charlett Blake Primary Cardiologist: Dr. Acie Fredrickson Reason for Consultation: atrial fibrillation  HPI:  80 yo with history of HTN and smoking presented with syncopal episode on 4/3 and was noted to be in atrial fibrillation with RVR.  She is very hard of hearing so history is difficult.  Patient has no history of atrial fibrillation.  On 4/3, she developed lightheadedness while standing in the kitchen and passed out briefly.  She fell against a counter and ended up fracturing several ribs on the right side.  She was brought to the ER and found to have BP 80/47 and to be in atrial fibrillation with RVR.  Of note, her lisinopril was increased not long ago.  She was given IV fluid and started on diltiazem gtt.  Her atrial fibrillation initially converted to NSR but she went back into atrial fibrillation yesterday and remains in atrial fibrillation this morning.  Currently, HR 90s-100s on diltiazem gtt 5 mg/hr. BP now stable.  Echo was done showing normal EF with mild AI, moderate TR.    She has significant pain over the right ribs (secondary to fracture).  Mobility has been poor and she will likely need to go to SNF.  She denies dyspnea.   Review of systems complete and found to be negative unless listed above in HPI  Past Medical History: 1. CAD: LHC "years ago" with luminal irregularities.  Cardiolite 6/13 with EF 65%, no ischemia/infarction. 2. Atrial fibrillation: First noted this admission.  Paroxysmal.  Echo (4/15) with EF 55-60%, mild LVH, mild AI, moderate TR, PA systolic pressure 39 mmHg.  3. HTN 4. H/o lung nodule 5. Active smoker 6. H/o vertigo  Family History  Problem Relation Age of Onset  . Heart disease Mother   . Hypertension Mother   . Diabetes Mother   . Diabetes    . Cancer      lung- in distant releatives  . Cancer Brother       History   Social History  . Marital Status: Widowed    Spouse Name: N/A    Number of Children: N/A  . Years of Education: N/A   Occupational History  . Not on file.   Social History Main Topics  . Smoking status: Current Every Day Smoker -- 0.25 packs/day for 60 years  . Smokeless tobacco: Never Used  . Alcohol Use: No  . Drug Use: No  . Sexual Activity: No   Other Topics Concern  . Not on file   Social History Narrative  . No narrative on file     Prescriptions prior to admission  Medication Sig Dispense Refill  . amLODipine (NORVASC) 5 MG tablet Take 1 tablet (5 mg total) by mouth daily.  30 tablet  1  . calcium-vitamin D (OSCAL WITH D 500-200) 500-200 MG-UNIT per tablet Take 1 tablet by mouth daily with breakfast.       . cetirizine (ZYRTEC) 10 MG tablet Take 10 mg by mouth every morning.      . fish oil-omega-3 fatty acids 1000 MG capsule Take 2 g by mouth daily.      Marland Kitchen lisinopril (PRINIVIL,ZESTRIL) 10 MG tablet Take 1 tablet (10 mg total) by mouth daily.  90 tablet  1  . metoprolol succinate (TOPROL-XL) 25 MG 24 hr tablet Take 25 mg by mouth daily.      . Multiple Vitamin (  MULTIVITAMIN) tablet Take 1 tablet by mouth daily.        Marland Kitchen neomycin-polymyxin-hydrocortisone (CORTISPORIN) otic solution Place 4 drops into both ears at bedtime as needed (x 5 days as needed for itchy, pain, discharge from ears.).  10 mL  1  . Saline (SODIUM CHLORIDE) 0.65 % SOLN by Nasal route. 2 sprays each nostril once daily as needed        Hospital Scheduled Meds: . aspirin  81 mg Oral Daily  . calcium-vitamin D  1 tablet Oral Q breakfast  . diltiazem  240 mg Oral Daily  . feeding supplement (ENSURE COMPLETE)  237 mL Oral BID BM  . heparin  5,000 Units Subcutaneous 3 times per day  . loratadine  10 mg Oral Daily  . metoprolol succinate  25 mg Oral Daily  . multivitamin with minerals  1 tablet Oral Daily  . omega-3 acid ethyl esters  2 g Oral BID  . sodium chloride  3 mL Intravenous Q12H    Continuous Infusions:  PRN Meds:.acetaminophen, traMADol   Physical exam Blood pressure 99/40, pulse 97, temperature 97.5 F (36.4 C), temperature source Oral, resp. rate 20, height 5\' 1"  (1.549 m), weight 103 lb 6.3 oz (46.9 kg), SpO2 98.00%. General: NAD, thin, hard of hearing Neck: No JVD, no thyromegaly or thyroid nodule.  Lungs: Crackles at bases bilaterally CV: Nondisplaced PMI.  Heart irregular S1/S2, no S3/S4, no murmur.  No peripheral edema.  No carotid bruit.    Abdomen: Soft, nontender, no hepatosplenomegaly, no distention.  Skin: Intact without lesions or rashes.  Neurologic: Alert and oriented x 3.  Psych: Normal affect. Extremities: No clubbing or cyanosis.  HEENT: Normal.  MSK: Pain to palpation over right lateral chest   Labs:   Lab Results  Component Value Date   WBC 7.1 06/29/2013   HGB 12.6 06/29/2013   HCT 36.3 06/29/2013   MCV 89.0 06/29/2013   PLT 209 06/29/2013    Recent Labs Lab 06/28/13 1626  07/01/13 0526  NA 143  < > 136*  K 3.9  < > 3.7  CL 105  < > 101  CO2 24  < > 20  BUN 14  < > 10  CREATININE 0.60  < > 0.47*  CALCIUM 9.3  < > 9.0  PROT 7.0  --   --   BILITOT 0.4  --   --   ALKPHOS 57  --   --   ALT 12  --   --   AST 21  --   --   GLUCOSE 145*  < > 108*  < > = values in this interval not displayed. Lab Results  Component Value Date   TROPONINI <0.30 06/29/2013  TSH normal Troponin negative x 3   Radiology: - Rib films: right rib fractures  EKG: atrial fibrillation at 139, anterolateral ST depression similar to prio ECG  ASSESSMENT AND PLAN: 80 yo with history of HTN and smoking presented with syncopal episode on 4/3 and was noted to be in atrial fibrillation with RVR.  1. Atrial fibrillation: Normal TSH, preserved EF on echo.  CHADSVASC = 5 (HTN, age, DM, gender).  HR now in 90s-100s on low-dose diltiazem gtt.  - Can stop gtt, convert to po diltiazem CD 240 mg daily.  Continue current Toprol XL.  - For now, would concentrate on rate  control of her atrial fibrillation rather than rhythm control.  - Has already been discussion with patient and daughter about anticoagulation.  Given  instability and frequent falls, decided on ASA 81 only.  2. Syncope: Possibly due to atrial fibrillation with RVR in the setting of antihypertensive use.  Echo showed preserved LV systolic function.  Have stopped lisinopril and will work on controlling rate.   3. Fall, rib fractures, deconditioning: Will need SNF.   We will follow with you.  Loralie Champagne 07/01/2013 11:35 AM

## 2013-07-01 NOTE — Progress Notes (Signed)
Physical Therapy Treatment Patient Details Name: AERIANNA LOSEY MRN: [id number] DOB: May 07, 1923 Today's Date: 07/01/2013    History of Present Illness Pt admit with syncope.    PT Comments    Patient making good gains with mobility and gait today.  No dizziness today.  Balance continues to be decreased.  Agree with need for SNF.  Follow Up Recommendations  SNF;Supervision/Assistance - 24 hour     Equipment Recommendations  Rolling walker with 5" wheels    Recommendations for Other Services       Precautions / Restrictions Precautions Precautions: Fall Restrictions Weight Bearing Restrictions: No    Mobility  Bed Mobility Overal bed mobility: Modified Independent Bed Mobility: Supine to Sit     Supine to sit: Modified independent (Device/Increase time)     General bed mobility comments: Patient using bed rail.  Increased time due to Rt flank pain with mobility  Transfers Overall transfer level: Needs assistance Equipment used: Rolling walker (2 wheeled) Transfers: Sit to/from Stand Sit to Stand: Min guard         General transfer comment: Patient reports no dizziness in sitting.  Verbal cues for hand placement.  Patient able to move to standing with increased time - min guard assist for safety.  Ambulation/Gait Ambulation/Gait assistance: Min guard Ambulation Distance (Feet): 100 Feet Assistive device: Rolling walker (2 wheeled) Gait Pattern/deviations: Step-through pattern;Decreased stride length;Trunk flexed Gait velocity: Decreased Gait velocity interpretation: Below normal speed for age/gender General Gait Details: Verbal cues for safe use of RW.  Verbal cues to stand upright and look forward during gait.  No c/o dizziness with gait.   Stairs            Wheelchair Mobility    Modified Rankin (Stroke Patients Only)       Balance Overall balance assessment: Needs assistance Sitting-balance support: No upper extremity supported;Feet  supported Sitting balance-Leahy Scale: Good Sitting balance - Comments: No assist needed.   Standing balance support: Single extremity supported Standing balance-Leahy Scale: Poor Standing balance comment: Patient can maintain balance with UE support                    Cognition Arousal/Alertness: Awake/alert Behavior During Therapy: WFL for tasks assessed/performed Overall Cognitive Status: No family/caregiver present to determine baseline cognitive functioning                      Exercises      General Comments        Pertinent Vitals/Pain     Home Living                      Prior Function            PT Goals (current goals can now be found in the care plan section) Progress towards PT goals: Progressing toward goals    Frequency  Min 3X/week    PT Plan Current plan remains appropriate;Equipment recommendations need to be updated    Co-evaluation             End of Session Equipment Utilized During Treatment: Gait belt Activity Tolerance: Patient tolerated treatment well Patient left: in chair;with call bell/phone within reach     Time: 7253-6644 PT Time Calculation (min): 28 min  Charges:  $Gait Training: 23-37 mins                    G Codes:      Henderson Newcomer  H 07/01/2013, 3:01 PM Carita Pian. Sanjuana Kava, [place] Pager [phone]

## 2013-07-01 NOTE — Care Management Note (Signed)
    Page 1 of 1   07/02/2013     2:59:01 PM   CARE MANAGEMENT NOTE 07/02/2013  Patient:  Beth [name], Beth [name]   Account Number:  [id number]  Date Initiated:  07/01/2013  Documentation initiated by:  Laketha Leopard  Subjective/Objective Assessment:   PT ADM ON 4/3 WITH SYNCOPE, AFIB WITH RVR.  PTA, PT LIVES ALONE, HAS SUPPORTIVE DAUGHTER.     Action/Plan:   P.T. RECOMMENDING SNF AT DC; CSW CONSULTED TO FACILITATE DC TO SNF WHEN MEDICALLY STABLE.   Anticipated DC Date:  07/02/2013   Anticipated DC Plan:  SKILLED NURSING FACILITY  In-house referral  Clinical Social Worker      DC Planning Services  CM consult      Choice offered to / List presented to:             Status of service:  Completed, signed off Medicare Important Message given?   (If response is "NO", the following Medicare IM given date fields will be blank) Date Medicare IM given:   Date Additional Medicare IM given:    Discharge Disposition:  [place]  Per UR Regulation:  Reviewed for med. necessity/level of care/duration of stay  If discussed at [place] of Stay Meetings, dates discussed:    Comments:  07/02/13 Beth Kallenberger,RN,BSN [phone] PT DISCHARGING TO [facility], PER CSW ARRANGEMENTS.

## 2013-07-01 NOTE — Progress Notes (Signed)
PROGRESS NOTE  Beth [name] YBO:[id number] DOB: [birth year] DOA: 06/28/2013 PCP: Penni Homans, MD  Assessment/Plan: Atrial Fibrillation with RVR  -Has continued to go in and out of PAF on diltiazem drip after initial spontaneous conversion to sinus  -A.M. 06/30/2013--> patient went back to A. fib with RVR--> restarted diltiazem drip--HR 90-110 -Continue metoprolol tartrate  -Long discussion with patient's daughter regarding risks and benefits of anticoagulation versus aspirin  -CHADSVASc= 5  -Daughter expressed concern regarding the patient's fall risk and hesitant for anticoagulation--> start aspirin  -Transferred to telemetry unit  -Echocardiogram--EF 55-60%, moderate TR -TSH--3.680  -consult cardiology Syncope  -Check orthostatics--negative  -Likely related to tachyarrhythmia  -09/08/2011 LexiScan negative for reversible ischemia, EF 65%  -Troponins negative x3  Hypertension  -Continue metoprolol succinate  -Discontinued amlodipine as diltiazem drip is being started  Deconditioning  -PT evaluation  -Patient will ultimately need skilled nursing facility-daughter agrees R-rib fractures  -Secondary to fall due to syncope  -Pain control  Severe Protein Calorie Malnutrition  -nutritional supplementation  Family Communication: Daughter updated on telephone; total time 35 min; greater than 50% of time spent counseling and coordinating care  Disposition Plan: SNF          Procedures/Studies: Dg Ribs Unilateral W/chest Right  06/28/2013   CLINICAL DATA:  Recent fall  EXAM: RIGHT RIBS AND CHEST - 3+ VIEW  COMPARISON:  05/19/2008  FINDINGS: The lungs are well aerated bilaterally. Multiple nodular densities are again identified and slightly increased when compare with the prior exam. Correlation to clinical history is recommended. No pneumothorax is noted. There are mildly displaced fractures of the seventh eighth and ninth ribs on the right posteriorly. No other  focal abnormality is seen.  IMPRESSION: Right rib fractures as described.   Electronically Signed   By: Inez Catalina M.D.   On: 06/28/2013 13:37   Dg Lumbar Spine Complete  06/28/2013   CLINICAL DATA:  Pain post trauma  EXAM: LUMBAR SPINE - COMPLETE 4+ VIEW  COMPARISON:  None.  FINDINGS: Frontal, lateral, spot lumbosacral lateral, and bilateral oblique views were obtained. There are 5 non-rib-bearing lumbar type vertebral bodies. There is dextrorotoscoliosis. There is no fracture or spondylolisthesis. There is moderately severe disc space narrowing in all levels. There is facet arthropathy at all levels bilaterally. There is extensive atherosclerotic change in the aorta and iliac arteries.  IMPRESSION: Scoliosis and multilevel osteoarthritic change. No fracture or spondylolisthesis. Extensive atherosclerotic change in the aorta and iliac arteries.   Electronically Signed   By: Lowella Grip M.D.   On: 06/28/2013 16:04   Dg Hip Complete Right  06/28/2013   CLINICAL DATA:  Fall with right hip pain  EXAM: RIGHT HIP - COMPLETE 2+ VIEW  COMPARISON:  None.  FINDINGS: The pelvic ring is intact. No acute fracture or dislocation is noted. No gross soft tissue abnormality is seen.  IMPRESSION: No acute abnormality noted.   Electronically Signed   By: Inez Catalina M.D.   On: 06/28/2013 13:31         Subjective: Patient has some intermittent nausea but denies any chest discomfort, shortness breath, vomiting, diarrhea, abdominal pain, dysuria no headaches or dizziness.  Objective: Filed Vitals:   06/30/13 1502 06/30/13 1942 07/01/13 0402 07/01/13 0828  BP: 116/57 128/65 123/72 99/40  Pulse: 97 93 62 97  Temp: 99.5 F (37.5 C) 98.2 F (36.8 C) 97.5 F (36.4 C)   TempSrc: Oral Oral Oral   Resp: 20  20 20   Height:      Weight:      SpO2: 99% 96% 98%     Intake/Output Summary (Last 24 hours) at 07/01/13 0926 Last data filed at 07/01/13 0800  Gross per 24 hour  Intake    840 ml  Output   1200 ml   Net   -360 ml   Weight change:  Exam:   General:  Pt is alert, follows commands appropriately, not in acute distress  HEENT: No icterus, No thrush,  [place]/AT  Cardiovascular: IRRR, S1/S2, no rubs,  Respiratory: CTA bilaterally, no wheezing, no crackles, no rhonchi  Abdomen: Soft/+BS, non tender, non distended, no guarding  Extremities: No edema, No lymphangitis, No petechiae, No rashes, no synovitis  Data Reviewed: Basic Metabolic Panel:  Recent Labs Lab 06/28/13 1626 06/29/13 0350 06/30/13 0300 07/01/13 0526  NA 143 142 139 136*  K 3.9 3.5* 3.7 3.7  CL 105 105 104 101  CO2 24 21 19 20   GLUCOSE 145* 94 84 108*  BUN 14 9 12 10   CREATININE 0.60 0.55 0.52 0.47*  CALCIUM 9.3 8.8 8.8 9.0  MG  --   --   --  1.9   Liver Function Tests:  Recent Labs Lab 06/28/13 1626  AST 21  ALT 12  ALKPHOS 57  BILITOT 0.4  PROT 7.0  ALBUMIN 3.7   No results found for this basename: LIPASE, AMYLASE,  in the last 168 hours No results found for this basename: AMMONIA,  in the last 168 hours CBC:  Recent Labs Lab 06/28/13 1530 06/29/13 0350  WBC 13.0* 7.1  NEUTROABS 11.5*  --   HGB 14.7 12.6  HCT 42.9 36.3  MCV 90.3 89.0  PLT 241 209   Cardiac Enzymes:  Recent Labs Lab 06/28/13 1626 06/28/13 2330 06/29/13 0350 06/29/13 1140  TROPONINI <0.30 <0.30 <0.30 <0.30   BNP: No components found with this basename: POCBNP,  CBG: No results found for this basename: GLUCAP,  in the last 168 hours  Recent Results (from the past 240 hour(s))  MRSA PCR SCREENING     Status: None   Collection Time    06/28/13  9:00 PM      Result Value Ref Range Status   MRSA by PCR NEGATIVE  NEGATIVE Final   Comment:            The GeneXpert MRSA Assay (FDA     approved for NASAL specimens     only), is one component of a     comprehensive MRSA colonization     surveillance program. It is not     intended to diagnose MRSA     infection nor to guide or     monitor treatment for      MRSA infections.     Scheduled Meds: . aspirin  81 mg Oral Daily  . calcium-vitamin D  1 tablet Oral Q breakfast  . feeding supplement (ENSURE COMPLETE)  237 mL Oral BID BM  . heparin  5,000 Units Subcutaneous 3 times per day  . loratadine  10 mg Oral Daily  . metoprolol succinate  25 mg Oral Daily  . multivitamin with minerals  1 tablet Oral Daily  . omega-3 acid ethyl esters  2 g Oral BID  . sodium chloride  3 mL Intravenous Q12H   Continuous Infusions: . diltiazem (CARDIZEM) infusion 10 mg/hr (06/30/13 0916)     Senan Urey, DO  [facility] Pager [phone]  If 7PM-7AM, please contact  night-coverage [email or web address] Password [email or web address] 07/01/2013, 9:26 AM   LOS: 3 days

## 2013-07-02 ENCOUNTER — Other Ambulatory Visit: Payer: Self-pay

## 2013-07-02 DIAGNOSIS — S2249XA Multiple fractures of ribs, unspecified side, initial encounter for closed fracture: Secondary | ICD-10-CM | POA: Diagnosis not present

## 2013-07-02 DIAGNOSIS — S2220XA Unspecified fracture of sternum, initial encounter for closed fracture: Secondary | ICD-10-CM | POA: Diagnosis not present

## 2013-07-02 DIAGNOSIS — R5381 Other malaise: Secondary | ICD-10-CM | POA: Diagnosis not present

## 2013-07-02 DIAGNOSIS — IMO0001 Reserved for inherently not codable concepts without codable children: Secondary | ICD-10-CM | POA: Diagnosis not present

## 2013-07-02 DIAGNOSIS — Z9181 History of falling: Secondary | ICD-10-CM | POA: Diagnosis not present

## 2013-07-02 DIAGNOSIS — S42302A Unspecified fracture of shaft of humerus, left arm, initial encounter for closed fracture: Secondary | ICD-10-CM | POA: Diagnosis not present

## 2013-07-02 DIAGNOSIS — I359 Nonrheumatic aortic valve disorder, unspecified: Secondary | ICD-10-CM | POA: Diagnosis not present

## 2013-07-02 DIAGNOSIS — R55 Syncope and collapse: Secondary | ICD-10-CM | POA: Diagnosis not present

## 2013-07-02 DIAGNOSIS — I4891 Unspecified atrial fibrillation: Secondary | ICD-10-CM | POA: Diagnosis not present

## 2013-07-02 DIAGNOSIS — E43 Unspecified severe protein-calorie malnutrition: Secondary | ICD-10-CM | POA: Diagnosis not present

## 2013-07-02 DIAGNOSIS — E46 Unspecified protein-calorie malnutrition: Secondary | ICD-10-CM | POA: Diagnosis not present

## 2013-07-02 DIAGNOSIS — I10 Essential (primary) hypertension: Secondary | ICD-10-CM | POA: Diagnosis not present

## 2013-07-02 DIAGNOSIS — M6281 Muscle weakness (generalized): Secondary | ICD-10-CM | POA: Diagnosis not present

## 2013-07-02 DIAGNOSIS — R5383 Other fatigue: Secondary | ICD-10-CM | POA: Diagnosis not present

## 2013-07-02 DIAGNOSIS — E785 Hyperlipidemia, unspecified: Secondary | ICD-10-CM | POA: Diagnosis not present

## 2013-07-02 DIAGNOSIS — J310 Chronic rhinitis: Secondary | ICD-10-CM | POA: Diagnosis not present

## 2013-07-02 DIAGNOSIS — R262 Difficulty in walking, not elsewhere classified: Secondary | ICD-10-CM | POA: Diagnosis not present

## 2013-07-02 LAB — BASIC METABOLIC PANEL
BUN: 15 mg/dL (ref 6–23)
CHLORIDE: 104 meq/L (ref 96–112)
CO2: 24 mEq/L (ref 19–32)
Calcium: 8.5 mg/dL (ref 8.4–10.5)
Creatinine, Ser: 0.57 mg/dL (ref 0.50–1.10)
GFR calc Af Amer: >90 mL/min (ref >90)
GFR calc non Af Amer: 81 mL/min — ABNORMAL LOW (ref >90)
GLUCOSE: 96 mg/dL (ref 70–99)
Potassium: 4.2 mEq/L (ref 3.7–5.3)
SODIUM: 139 meq/L (ref 137–147)

## 2013-07-02 MED ORDER — TRAMADOL HCL 50 MG PO TABS: 50.0000 mg | ORAL_TABLET | Freq: Four times a day (QID) | ORAL | Status: DC | PRN

## 2013-07-02 MED ORDER — DILTIAZEM HCL ER COATED BEADS 240 MG PO CP24: 240.0000 mg | ORAL_CAPSULE | Freq: Every day | ORAL | Status: DC

## 2013-07-02 MED ORDER — ASPIRIN 81 MG PO CHEW: 81.0000 mg | CHEWABLE_TABLET | Freq: Every day | ORAL | Status: DC

## 2013-07-02 NOTE — Progress Notes (Signed)
    Subjective:  Up in chair, reading, "better today"  Objective:  Vital Signs in the last 24 hours: Temp:  [97.3 F (36.3 C)-98.3 F (36.8 C)] 97.3 F (36.3 C) (04/07 0424) Pulse Rate:  [72-75] 72 (04/07 0424) Resp:  [18-20] 18 (04/07 0424) BP: (113-132)/(45-50) 132/50 mmHg (04/07 0424) SpO2:  [93 %-98 %] 93 % (04/07 0424)  Intake/Output from previous day:  Intake/Output Summary (Last 24 hours) at 07/02/13 0951 Last data filed at 07/02/13 0730  Gross per 24 hour  Intake    723 ml  Output   1650 ml  Net   -927 ml    Physical Exam: General appearance: alert, cooperative, appears stated age, cachectic, no distress and HOH Lungs: kyphosis, crackles Lt base Heart: regular rate and rhythm   Rate: 74  Rhythm: normal sinus rhythm and PVCs  Lab Results: No results found for this basename: WBC, HGB, PLT,  in the last 72 hours  Recent Labs  07/01/13 0526 07/02/13 0602  NA 136* 139  K 3.7 4.2  CL 101 104  CO2 20 24  GLUCOSE 108* 96  BUN 10 15  CREATININE 0.47* 0.57    Recent Labs  06/29/13 1140  TROPONINI <0.30   No results found for this basename: INR,  in the last 72 hours  Imaging: Imaging results have been reviewed  Cardiac Studies: Echo 06/30/13- Study Conclusions  - Left ventricle: The cavity size was normal. Wall thickness was increased in a pattern of mild LVH. There was mild focal basal hypertrophy of the septum. Systolic function was normal. The estimated ejection fraction was in the range of 55% to 60%. Wall motion was normal; there were no regional wall motion abnormalities. - Aortic valve: Mild regurgitation. - Tricuspid valve: Moderate regurgitation. - Pulmonary arteries: Systolic pressure was mildly increased. PA peak pressure: 31mm Hg (S). Impressions:  - Compared to 08/26/11, LV function remains normal; moderate TR and mild AI now evident.    Assessment/Plan:  80 yo with history of HTN, HOH, and smoking presented with syncopal episode  on 4/3 with fx Rt ribs. She was noted to be in atrial fibrillation with RVR. She converted to NSR with PVCs spontaneously 4/6 12:30pm.    Principal Problem:   Syncope Active Problems:   Atrial fibrillation with RVR   HYPERTENSION   Multiple rib fractures   Protein-calorie malnutrition, severe   Aortic insufficiency    PLAN: Check 12 lead today in NSR. Pt admits she drinks a lot of coffee "I like the strong stuff". She is in NSR with the change from Zestril to Diltiazem, in addition to her usual Toprol 25 mg. She has been instructed to avoid caffeine. PT recommends SNF  And continued PT at discharge. We will arrange OP follow up.   Kerin Ransom PA-C Beeper [phone] 07/02/2013, 9:51 AM   Patient seen, examined. Available data reviewed. Agree with findings, assessment, and plan as outlined by Kerin Ransom, PA-C. The patient is back in sinus rhythm. She seems to be tolerating the addition of oral diltiazem. I agree that she is not an appropriate candidate for anticoagulation with advanced age and excessive fall risk. Plans as noted above. Will arrange outpatient followup. Please call if any other cardiac issues arise.  Sherren Mocha, M.D. 07/02/2013 10:38 AM

## 2013-07-02 NOTE — Discharge Instructions (Signed)
Atrial Fibrillation  Atrial fibrillation is a type of irregular heart rhythm (arrhythmia). During atrial fibrillation, the upper chambers of the heart (atria) quiver continuously in a chaotic pattern. This causes an irregular and often rapid heart rate.   Atrial fibrillation is the result of the heart becoming overloaded with disorganized signals that tell it to beat. These signals are normally released one at a time by a part of the right atrium called the sinoatrial node. They then travel from the atria to the lower chambers of the heart (ventricles), causing the atria and ventricles to contract and pump blood as they pass. In atrial fibrillation, parts of the atria outside of the sinoatrial node also release these signals. This results in two problems. First, the atria receive so many signals that they do not have time to fully contract. Second, the ventricles, which can only receive one signal at a time, beat irregularly and out of rhythm with the atria.   There are three types of atrial fibrillation:    Paroxysmal Paroxysmal atrial fibrillation starts suddenly and stops on its own within a week.    Persistent Persistent atrial fibrillation lasts for more than a week. It may stop on its own or with treatment.    Permanent Permanent atrial fibrillation does not go away. Episodes of atrial fibrillation may lead to permanent atrial fibrillation.   Atrial fibrillation can prevent your heart from pumping blood normally. It increases your risk of stroke and can lead to heart failure.   CAUSES    Heart conditions, including a heart attack, heart failure, coronary artery disease, and heart valve conditions.    Inflammation of the sac that surrounds the heart (pericarditis).    Blockage of an artery in the lungs (pulmonary embolism).    Pneumonia or other infections.    Chronic lung disease.    Thyroid problems, especially if the thyroid is overactive (hyperthyroidism).    Caffeine, excessive alcohol  use, and use of some illegal drugs.    Use of some medications, including certain decongestants and diet pills.    Heart surgery.    Birth defects.   Sometimes, no cause can be found. When this happens, the atrial fibrillation is called lone atrial fibrillation. The risk of complications from atrial fibrillation increases if you have lone atrial fibrillation and you are age 60 years or older.  RISK FACTORS   Heart failure.   Coronary artery disease   Diabetes mellitus.    High blood pressure (hypertension).    Obesity.    Other arrhythmias.    Increased age.  SYMPTOMS    A feeling that your heart is beating rapidly or irregularly.    A feeling of discomfort or pain in your chest.    Shortness of breath.    Sudden lightheadedness or weakness.    Getting tired easily when exercising.    Urinating more often than normal (mainly when atrial fibrillation first begins).   In paroxysmal atrial fibrillation, symptoms may start and suddenly stop.  DIAGNOSIS   Your caregiver may be able to detect atrial fibrillation when taking your pulse. Usually, testing is needed to diagnosis atrial fibrillation. Tests may include:    Electrocardiography. During this test, the electrical impulses of your heart are recorded while you are lying down.    Echocardiography. During echocardiography, sound waves are used to evaluate how blood flows through your heart.    Stress test. There is more than one type of stress test. If a stress test is   needed, ask your caregiver about which type is best for you.    Chest X-ray exam.    Blood tests.    Computed tomography (CT).   TREATMENT    Treating any underlying conditions. For example, if you have an overactive thyroid, treating the condition may correct atrial fibrillation.    Medication. Medications may be given to control a rapid heart rate or to prevent blood clots, heart failure, or a stroke.    Procedure to correct the rhythm of the  heart:   Electrical cardioversion. During electrical cardioversion, a controlled, low-energy shock is delivered to the heart through your skin. If you have chest pain, very low pressure blood pressure, or sudden heart failure, this procedure may need to be done as an emergency.   Catheter ablation. During this procedure, heart tissues that send the signals that cause atrial fibrillation are destroyed.   Maze or minimaze procedure. During this surgery, thin lines of heart tissue that carry the abnormal signals are destroyed. The maze procedure is an open-heart surgery. The minimaze procedure is a minimally invasive surgery. This means that small cuts are made to access the heart instead of a large opening.   Pulmonary venous isolation. During this surgery, tissue around the veins that carry blood from the lungs (pulmonary veins) is destroyed. This tissue is thought to carry the abnormal signals.  HOME CARE INSTRUCTIONS    Take medications as directed by your caregiver.   Only take medications that your caregiver approves. Some medications can make atrial fibrillation worse or recur.   If blood thinners were prescribed by your caregiver, take them exactly as directed. Too much can cause bleeding. Too little and you will not have the needed protection against stroke and other problems.   Perform blood tests at home if directed by your caregiver.   Perform blood tests exactly as directed.    Quit smoking if you smoke.    Do not drink alcohol.    Do not drink caffeinated beverages such as coffee, soda, and some teas. You may drink decaffeinated coffee, soda, or tea.    Maintain a healthy weight. Do not use diet pills unless your caregiver approves. They may make heart problems worse.    Follow diet instructions as directed by your caregiver.    Exercise regularly as directed by your caregiver.    Keep all follow-up appointments.  PREVENTION   The following substances can cause atrial fibrillation  to recur:    Caffeinated beverages.    Alcohol.    Certain medications, especially those used for breathing problems.    Certain herbs and herbal medications, such as those containing ephedra or ginseng.   Illegal drugs such as cocaine and amphetamines.  Sometimes medications are given to prevent atrial fibrillation from recurring. Proper treatment of any underlying condition is also important in helping prevent recurrence.   SEEK MEDICAL CARE IF:   You notice a change in the rate, rhythm, or strength of your heartbeat.    You suddenly begin urinating more frequently.    You tire more easily when exerting yourself or exercising.   SEEK IMMEDIATE MEDICAL CARE IF:    You develop chest pain, abdominal pain, sweating, or weakness.   You feel sick to your stomach (nauseous).   You develop shortness of breath.   You suddenly develop swollen feet and ankles.   You feel dizzy.   You face or limbs feel numb or weak.   There is a change in your   vision or speech.  MAKE SURE YOU:    Understand these instructions.   Will watch your condition.   Will get help right away if you are not doing well or get worse.  Document Released: 03/14/2005 Document Revised: 07/09/2012 Document Reviewed: 04/24/2012  [employer] Patient Information 2014 [employer].

## 2013-07-02 NOTE — Progress Notes (Signed)
Clinical Social Work Department CLINICAL SOCIAL WORK PLACEMENT NOTE 07/02/2013  Patient:  Beth [name], Beth [name]  Account Number:  [id number] Admit date:  06/28/2013  Clinical Social Worker:  Megan Salon  Date/time:  07/01/2013 10:49 AM  Clinical Social Work is seeking post-discharge placement for this patient at the following level of care:   [facility] [place]   (*CSW will update this form in Epic as items are completed)   07/01/2013  Patient/family provided with [place] Department of Clinical Social Work's list of facilities offering this level of care within the geographic area requested by the patient (or if unable, by the patient's family).  07/01/2013  Patient/family informed of their freedom to choose among providers that offer the needed level of care, that participate in Medicare, Medicaid or managed care program needed by the patient, have an available bed and are willing to accept the patient.  07/01/2013  Patient/family informed of [facility]' ownership interest in [facility], as well as of the fact that they are under no obligation to receive care at this facility.  PASARR submitted to EDS on 07/01/2013 PASARR number received from EDS on 07/01/2013  FL2 transmitted to all facilities in geographic area requested by pt/family on  07/01/2013 FL2 transmitted to all facilities within larger geographic area on   Patient informed that his/her managed care company has contracts with or will negotiate with  certain facilities, including the following:     Patient/family informed of bed offers received:  07/01/2013 Patient chooses bed at [place] Physician recommends and patient chooses bed at    Patient to be transferred to [place] on  07/02/2013 Patient to be transferred to facility by EMS  The following physician request were entered in Epic:   Additional Comments:  Jeanette Caprice, MSW, [place]

## 2013-07-02 NOTE — Progress Notes (Signed)
Clinical Social Worker facilitated patient discharge by contacting the patient, family and facility, [facility]. Daughter states she is meeting with [place] at 3pm in patient's room to go over paperwork and "would like to take patient to [facility] as long as she can be wheeled out to the car in a wheelchair. CSW stated she will make sure RN and MD are okay with vehicle transport. Patient and patient's daughter are agreeable to SNF and would like transport via daughter. CSW will sign off, as social work intervention is no longer needed.  Jeanette Caprice, MSW, [place]

## 2013-07-02 NOTE — Discharge Summary (Signed)
Physician Discharge Summary  Beth [name] [id number] DOB: [birth year] DOA: 06/28/2013  PCP: Penni Homans, MD  Admit date: 06/28/2013 Discharge date: 07/02/2013  Recommendations for Outpatient Follow-up:  1. Pt will need to follow up with PCP in 2 weeks post discharge 2. Please obtain BMP to evaluate electrolytes and kidney function 3. Please also check CBC to evaluate Hg and Hct levels  Discharge Diagnoses:  Principal Problem:   Syncope Active Problems:   HYPERTENSION   Aortic insufficiency   Atrial fibrillation with RVR   Protein-calorie malnutrition, severe   Multiple rib fractures Atrial Fibrillation with RVR  -Has continued to go in and out of PAF on diltiazem drip after initial spontaneous conversion to sinus  -A.M. 06/30/2013--> Beth [name] went back to A. fib with RVR--> restarted diltiazem drip--HR 90-110  -Continue metoprolol tartrate  -Long discussion with Beth [name]'s daughter regarding risks and benefits of anticoagulation versus aspirin  -CHADSVASc= 5  -Daughter expressed concern regarding the Beth [name]'s fall risk and hesitant for anticoagulation--> start aspirin  -Transferred to telemetry unit  -Echocardiogram--EF 55-60%, moderate TR  -TSH--3.680  -appreciate cardiology input who felt rate control was more important at this time -diltiazem CD 240mg  started -pt spontaneously converted back to sinus and remained in sinus on day of dc Syncope  -Check orthostatics--negative  -Likely related to tachyarrhythmia  -09/08/2011 LexiScan negative for reversible ischemia, EF 65%  -Troponins negative x3  Hypertension  -Continue metoprolol succinate  -Discontinued amlodipine as diltiazem drip is being started  Deconditioning  -PT evaluation  -Beth [name] will ultimately need skilled nursing facility-daughter agrees  R-rib fractures  -Secondary to fall due to syncope  -Pain control--tramadol Severe Protein Calorie Malnutrition  -nutritional supplementation  Family  Communication: Daughter updated on telephone; total time 35 min; greater than 50% of time spent counseling and coordinating care  Disposition Plan: SNF   Discharge Condition: stable  Disposition:  Follow-up Information   Follow up with Truitt Merle, NP On 07/16/2013. (9 am)    Specialty:  Nurse Practitioner   Contact information:   [address] [place] [address] [phone]     [address] [facility]  Diet:heart healthy Wt Readings from Last 3 Encounters:  06/28/13 46.9 kg (103 lb 6.3 oz)  06/25/13 48.988 kg (108 lb)  06/17/13 48.136 kg (106 lb 1.9 oz)    History of present illness:   80 y.o. female who presents to the ED at [facility] after a syncopal episode that occurred at home this morning. Episode occurred while standing in the kitchen. She began to feel lightheaded and diaphoretic, and subsequently had a syncopal episode and fall in her kitchen. She thinks she was unconscious for a "very short" period of time. After waking back up she was able to stand back up and call her daughter. Daughter brought Beth [name] to ED to be evaluated.  Has h/o HTN, however BP after fall was 80/47, she recently had lisinopril dose increased. In the ED she was found to be in A.Fib with RVR. RVR improved with cardizem gtt, she was transported to [facility]. On the way over she converted back to NSR.  The Beth [name] was moved to the telemetry floor. The Beth [name] went back into atrial fibrillation with RVR. She was placed on a diltiazem drip. She spontaneously converted back to sinus rhythm. Because of drip was converted to diltiazem CD 240 mg daily. The Beth [name] remained hemodynamically stable in sinus rhythm. The case was discussed with the Beth [name]'s daughter who felt that the Beth [name] was a high risk  for fall. As a result, the Beth [name] was started on aspirin. Echocardiogram showed EF 55-60% with moderate TR. There was no wall motion abnormality. The Beth [name] was seen by PT. They recommended skilled nursing  facility. The Beth [name] will be discharged in stable condition.    Consultants: cardiology  Discharge Exam: Filed Vitals:   07/02/13 0424  BP: 132/50  Pulse: 72  Temp: 97.3 F (36.3 C)  Resp: 18   Filed Vitals:   07/01/13 0828 07/01/13 1338 07/01/13 2015 07/02/13 0424  BP: 99/40 113/45 114/49 132/50  Pulse: 97 72 75 72  Temp:  98.3 F (36.8 C) 98.3 F (36.8 C) 97.3 F (36.3 C)  TempSrc:  Oral Oral Oral  Resp:  20 18 18   Height:      Weight:      SpO2:  98% 95% 93%   General: A&O x 2, NAD, pleasant, cooperative Cardiovascular: RRR, no rub, no gallop, no S3 Respiratory: L-base crackles, R-CTA, no wheeze, no rhonchi Abdomen:soft, nontender, nondistended, positive bowel sounds Extremities: No edema, No lymphangitis, no petechiae  Discharge Instructions       Future Appointments Provider Department Dept Phone   07/16/2013 9:00 AM Burtis Junes, NP [place] [facility] [phone]       Medication List    STOP taking these medications       amLODipine 5 MG tablet  Commonly known as:  NORVASC     lisinopril 10 MG tablet  Commonly known as:  PRINIVIL,ZESTRIL      TAKE these medications       aspirin 81 MG chewable tablet  Chew 1 tablet (81 mg total) by mouth daily.     calcium-vitamin D 500-200 MG-UNIT per tablet  Commonly known as:  OSCAL WITH D  Take 1 tablet by mouth daily with breakfast.     cetirizine 10 MG tablet  Commonly known as:  ZYRTEC  Take 10 mg by mouth every morning.     diltiazem 240 MG 24 hr capsule  Commonly known as:  CARDIZEM CD  Take 1 capsule (240 mg total) by mouth daily.     fish oil-omega-3 fatty acids 1000 MG capsule  Take 2 g by mouth daily.     metoprolol succinate 25 MG 24 hr tablet  Commonly known as:  TOPROL-XL  Take 25 mg by mouth daily.     multivitamin tablet  Take 1 tablet by mouth daily.     neomycin-polymyxin-hydrocortisone otic solution  Commonly known as:  CORTISPORIN  Place 4 drops into  both ears at bedtime as needed (x 5 days as needed for itchy, pain, discharge from ears.).     sodium chloride 0.65 % Soln nasal spray  Commonly known as:  OCEAN  by Nasal route. 2 sprays each nostril once daily as needed     traMADol 50 MG tablet  Commonly known as:  ULTRAM  Take 1 tablet (50 mg total) by mouth every 6 (six) hours as needed for moderate pain.         The results of significant diagnostics from this hospitalization (including imaging, microbiology, ancillary and laboratory) are listed below for reference.    Significant Diagnostic Studies: Dg Ribs Unilateral W/chest Right  06/28/2013   CLINICAL DATA:  Recent fall  EXAM: RIGHT RIBS AND CHEST - 3+ VIEW  COMPARISON:  05/19/2008  FINDINGS: The lungs are well aerated bilaterally. Multiple nodular densities are again identified and slightly increased when compare with the prior exam. Correlation to clinical  history is recommended. No pneumothorax is noted. There are mildly displaced fractures of the seventh eighth and ninth ribs on the right posteriorly. No other focal abnormality is seen.  IMPRESSION: Right rib fractures as described.   Electronically Signed   By: Inez Catalina M.D.   On: 06/28/2013 13:37   Dg Lumbar Spine Complete  06/28/2013   CLINICAL DATA:  Pain post trauma  EXAM: LUMBAR SPINE - COMPLETE 4+ VIEW  COMPARISON:  None.  FINDINGS: Frontal, lateral, spot lumbosacral lateral, and bilateral oblique views were obtained. There are 5 non-rib-bearing lumbar type vertebral bodies. There is dextrorotoscoliosis. There is no fracture or spondylolisthesis. There is moderately severe disc space narrowing in all levels. There is facet arthropathy at all levels bilaterally. There is extensive atherosclerotic change in the aorta and iliac arteries.  IMPRESSION: Scoliosis and multilevel osteoarthritic change. No fracture or spondylolisthesis. Extensive atherosclerotic change in the aorta and iliac arteries.   Electronically Signed   By:  Lowella Grip M.D.   On: 06/28/2013 16:04   Dg Hip Complete Right  06/28/2013   CLINICAL DATA:  Fall with right hip pain  EXAM: RIGHT HIP - COMPLETE 2+ VIEW  COMPARISON:  None.  FINDINGS: The pelvic ring is intact. No acute fracture or dislocation is noted. No gross soft tissue abnormality is seen.  IMPRESSION: No acute abnormality noted.   Electronically Signed   By: Inez Catalina M.D.   On: 06/28/2013 13:31     Microbiology: Recent Results (from the past 240 hour(s))  MRSA PCR SCREENING     Status: None   Collection Time    06/28/13  9:00 PM      Result Value Ref Range Status   MRSA by PCR NEGATIVE  NEGATIVE Final   Comment:            The GeneXpert MRSA Assay (FDA     approved for NASAL specimens     only), is one component of a     comprehensive MRSA colonization     surveillance program. It is not     intended to diagnose MRSA     infection nor to guide or     monitor treatment for     MRSA infections.     Labs: Basic Metabolic Panel:  Recent Labs Lab 06/28/13 1626 06/29/13 0350 06/30/13 0300 07/01/13 0526 07/02/13 0602  NA 143 142 139 136* 139  K 3.9 3.5* 3.7 3.7 4.2  CL 105 105 104 101 104  CO2 24 21 19 20 24   GLUCOSE 145* 94 84 108* 96  BUN 14 9 12 10 15   CREATININE 0.60 0.55 0.52 0.47* 0.57  CALCIUM 9.3 8.8 8.8 9.0 8.5  MG  --   --   --  1.9  --    Liver Function Tests:  Recent Labs Lab 06/28/13 1626  AST 21  ALT 12  ALKPHOS 57  BILITOT 0.4  PROT 7.0  ALBUMIN 3.7   No results found for this basename: LIPASE, AMYLASE,  in the last 168 hours No results found for this basename: AMMONIA,  in the last 168 hours CBC:  Recent Labs Lab 06/28/13 1530 06/29/13 0350  WBC 13.0* 7.1  NEUTROABS 11.5*  --   HGB 14.7 12.6  HCT 42.9 36.3  MCV 90.3 89.0  PLT 241 209   Cardiac Enzymes:  Recent Labs Lab 06/28/13 1626 06/28/13 2330 06/29/13 0350 06/29/13 1140  TROPONINI <0.30 <0.30 <0.30 <0.30   BNP: No components found with this  basename:  POCBNP,  CBG: No results found for this basename: GLUCAP,  in the last 168 hours  Time coordinating discharge:  Greater than 30 minutes  Signed:  Tiyona Desouza, DO [facility] Pager: [phone] 07/02/2013, 11:00 AM

## 2013-07-04 ENCOUNTER — Encounter: Payer: Self-pay | Admitting: *Deleted

## 2013-07-04 ENCOUNTER — Encounter: Payer: Self-pay | Admitting: Adult Health

## 2013-07-04 ENCOUNTER — Non-Acute Institutional Stay (SKILLED_NURSING_FACILITY): Payer: Medicare Other | Admitting: Adult Health

## 2013-07-04 DIAGNOSIS — J310 Chronic rhinitis: Secondary | ICD-10-CM

## 2013-07-04 DIAGNOSIS — I4891 Unspecified atrial fibrillation: Secondary | ICD-10-CM

## 2013-07-04 DIAGNOSIS — R55 Syncope and collapse: Secondary | ICD-10-CM

## 2013-07-04 DIAGNOSIS — S2249XA Multiple fractures of ribs, unspecified side, initial encounter for closed fracture: Secondary | ICD-10-CM

## 2013-07-04 DIAGNOSIS — I10 Essential (primary) hypertension: Secondary | ICD-10-CM

## 2013-07-04 DIAGNOSIS — E785 Hyperlipidemia, unspecified: Secondary | ICD-10-CM

## 2013-07-04 NOTE — Progress Notes (Signed)
Patient ID: BARBAR BREDE, female   DOB: 1923-12-18, 80 y.o.   MRN: [id number]               PROGRESS NOTE  DATE: 07/04/2013  FACILITY: Nursing Home Location: [facility]  LEVEL OF CARE: SNF (31)  Acute Visit  CHIEF COMPLAINT:  Follow-up hospitalization  HISTORY OF PRESENT ILLNESS: This is an 80 year old female who has been admitted to [facility] on 07/02/13 from [facility]. She had a Syncopal episode at home and sustained multiple right rib fractures (7th, 8th and 9th). She has been admitted for a short-term rehabilitation.  REASSESSMENT OF ONGOING PROBLEM(S):  ATRIAL FIBRILLATION: the patients atrial fibrillation remains stable.  The patient denies DOE, tachycardia, orthopnea, transient neurological sx, pedal edema, palpitations, & PNDs.  No complications noted from the medications currently being used.  HTN: Pt 's HTN remains stable.  Denies CP, sob, DOE, pedal edema, headaches, dizziness or visual disturbances.  No complications from the medications currently being used.  Last BP : 131/47  ALLERGIC RHINITIS: Allergic rhinitis remains stable.  Patient denies ongoing symptoms such as runny nose sneezing or tearing. No complications reported from the current medication(s) being used.   PAST MEDICAL HISTORY : Reviewed.  No changes.  CURRENT MEDICATIONS: Reviewed per [facility]  REVIEW OF SYSTEMS:  GENERAL: no change in appetite, no fatigue, no weight changes, no fever, chills or weakness RESPIRATORY: no cough, SOB, DOE, wheezing, hemoptysis CARDIAC: no chest pain, edema or palpitations GI: no abdominal pain, diarrhea, heart burn, nausea or vomiting, +constipation  PHYSICAL EXAMINATION  GENERAL: no acute distress, normal body habitus EYES: conjunctivae normal, sclerae normal, normal eye lids EARS: wears bilateral hearing aids NECK: supple, trachea midline, no neck masses, no thyroid tenderness, no thyromegaly LYMPHATICS: no LAN in the neck, no  supraclavicular LAN RESPIRATORY: breathing is even & unlabored, BS CTAB CARDIAC: RRR, no murmur,no extra heart sounds, no edema GI: abdomen soft, normal BS, no masses, no tenderness, no hepatomegaly, no splenomegaly EXTREMITIES: able to move all 4 extremities PSYCHIATRIC: the patient is alert & oriented to person, affect & behavior appropriate  LABS/RADIOLOGY: Labs reviewed: Basic Metabolic Panel:  Recent Labs  01/15/13 1613  06/30/13 0300 07/01/13 0526 07/02/13 0602  NA 142  < > 139 136* 139  K 4.1  < > 3.7 3.7 4.2  CL 105  < > 104 101 104  CO2 26  < > 19 20 24   GLUCOSE 97  < > 84 108* 96  BUN 8  < > 12 10 15   CREATININE 0.71  < > 0.52 0.47* 0.57  CALCIUM 10.0  < > 8.8 9.0 8.5  MG  --   --   --  1.9  --   PHOS 3.5  --   --   --   --   < > = values in this interval not displayed. Liver Function Tests:  Recent Labs  01/15/13 1613 06/28/13 1626  AST 18 21  ALT 10 12  ALKPHOS 63 57  BILITOT 0.5 0.4  PROT 7.3 7.0  ALBUMIN 4.4  4.4 3.7    CBC:  Recent Labs  01/15/13 1613 06/28/13 1530 06/29/13 0350  WBC 6.9 13.0* 7.1  NEUTROABS  --  11.5*  --   HGB 13.8 14.7 12.6  HCT 39.3 42.9 36.3  MCV 85.6 90.3 89.0  PLT 298 241 209   Lipid Panel:  Recent Labs  01/15/13 1613  HDL 72   Cardiac Enzymes:  Recent Labs  06/28/13 2330 06/29/13 0350 06/29/13 1140  TROPONINI <0.30 <0.30 <0.30    ASSESSMENT/PLAN:  Multiple rib fractures S/P syncopal fall -  for rehabilitation  Hypertension - well-controlled; continue Lopressor and Cardizem Atrial Fibrillation - rate-controlled; continue Lopressor Allergic Rhinitis - continue Zyrtec Hyperlipidemia - continue Fish Oil Constipation - start Colace 100 mg PO BID and Miralax 17 gm + 6-8 oz liquid PO Q D   CPT CODE: 02409    Seth Bake - NP [facility] [phone]

## 2013-07-11 ENCOUNTER — Non-Acute Institutional Stay (SKILLED_NURSING_FACILITY): Payer: Medicare Other | Admitting: Internal Medicine

## 2013-07-11 DIAGNOSIS — I10 Essential (primary) hypertension: Secondary | ICD-10-CM | POA: Diagnosis not present

## 2013-07-11 DIAGNOSIS — S2249XA Multiple fractures of ribs, unspecified side, initial encounter for closed fracture: Secondary | ICD-10-CM

## 2013-07-11 DIAGNOSIS — R5381 Other malaise: Secondary | ICD-10-CM

## 2013-07-11 DIAGNOSIS — R55 Syncope and collapse: Secondary | ICD-10-CM | POA: Diagnosis not present

## 2013-07-11 DIAGNOSIS — R5383 Other fatigue: Secondary | ICD-10-CM | POA: Diagnosis not present

## 2013-07-11 DIAGNOSIS — R531 Weakness: Secondary | ICD-10-CM

## 2013-07-11 DIAGNOSIS — I4891 Unspecified atrial fibrillation: Secondary | ICD-10-CM | POA: Diagnosis not present

## 2013-07-11 DIAGNOSIS — E43 Unspecified severe protein-calorie malnutrition: Secondary | ICD-10-CM

## 2013-07-13 ENCOUNTER — Encounter: Payer: Self-pay | Admitting: Internal Medicine

## 2013-07-13 DIAGNOSIS — R531 Weakness: Secondary | ICD-10-CM | POA: Insufficient documentation

## 2013-07-13 NOTE — Progress Notes (Signed)
MRN: [id number] Name: Beth [name]  Sex: female Age: 80 y.o. DOB: Jun 11, 1923  [place] #: [facility] Facility/Room: 701 Level Of Care: SNF Provider: Hennie Duos Emergency Contacts: Extended Emergency Contact Information Primary Emergency Contact: Fluke,Janice Address: [address]           [address] [place] Phone: [phone] Relation: Daughter Secondary Emergency Contact: Fulton Reek Address: [address]          [place], [place] [address] [place] Phone: [phone] Relation: Relative  Code Status: FULl  Allergies: Ciprofloxacin; Fexofenadine; and Penicillins  Chief Complaint  Patient presents with  . nursing home admission    HPI: Patient is 80 y.o. female who is admitted for OT/PT after syncope from a fib with RVR.  Past Medical History  Diagnosis Date  . Hypertension   . Lung nodule     CT of chest 2007-left loewr lobar mass- evaluated by pulmonary- patient refused further work up  . Tobacco abuse   . Vertigo   . Abdominal pain, unspecified site 08/17/2012  . Other and unspecified hyperlipidemia 08/17/2012  . Loss of weight 01/19/2013  . Allergic state 06/22/2013  . Allergy   . Osteoporosis   . Syncope     fall    Past Surgical History  Procedure Laterality Date  . Cholecystectomy    . Cesarean section    . Hernia repair    . Abdominal hysterectomy        Medication List       This list is accurate as of: 07/11/13 11:59 PM.  Always use your most recent med list.               aspirin 81 MG chewable tablet  Chew 1 tablet (81 mg total) by mouth daily.     calcium-vitamin D 500-200 MG-UNIT per tablet  Commonly known as:  OSCAL WITH D  Take 1 tablet by mouth daily with breakfast.     cetirizine 10 MG tablet  Commonly known as:  ZYRTEC  Take 10 mg by mouth every morning.     diltiazem 240 MG 24 hr capsule  Commonly known as:  CARDIZEM CD  Take 1 capsule (240 mg total) by mouth  daily.     fish oil-omega-3 fatty acids 1000 MG capsule  Take 2 g by mouth daily.     metoprolol succinate 25 MG 24 hr tablet  Commonly known as:  TOPROL-XL  Take 25 mg by mouth daily.     multivitamin tablet  Take 1 tablet by mouth daily.     neomycin-polymyxin-hydrocortisone otic solution  Commonly known as:  CORTISPORIN  Place 4 drops into both ears at bedtime as needed (x 5 days as needed for itchy, pain, discharge from ears.).     sodium chloride 0.65 % Soln nasal spray  Commonly known as:  OCEAN  by Nasal route. 2 sprays each nostril once daily as needed     traMADol 50 MG tablet  Commonly known as:  ULTRAM  Take 1 tablet (50 mg total) by mouth every 6 (six) hours as needed for moderate pain.        No orders of the defined types were placed in this encounter.    Immunization History  Administered Date(s) Administered  . Pneumococcal Polysaccharide-23 05/19/2008  . Td 10/07/2003    History  Substance Use Topics  . Smoking status: Current Every Day Smoker -- 0.25 packs/day for 60 years  . Smokeless tobacco:  Never Used  . Alcohol Use: No    Family history is noncontributory    Review of Systems  DATA OBTAINED: from patient GENERAL: Feels well no fevers, fatigue, appetite changes SKIN: No itching, rash or wounds EYES: No eye pain, redness, discharge EARS: No earache, tinnitus, change in hearing NOSE: No congestion, drainage or bleeding  MOUTH/THROAT: No mouth or tooth pain, No sore throat RESPIRATORY: No cough, wheezing, SOB CARDIAC: No chest pain, palpitations, lower extremity edema  GI: No abdominal pain, No N/V/D or constipation, No heartburn or reflux  GU: No dysuria, frequency or urgency, or incontinence  MUSCULOSKELETAL: No unrelieved bone/joint pain NEUROLOGIC: No headache, dizziness or focal weakness PSYCHIATRIC: No overt anxiety or sadness. Sleeps well. No behavior issue.   Filed Vitals:   07/13/13 1935  BP: 143/72  Pulse: 59  Temp: 97.3  F (36.3 C)  Resp: 18    Physical Exam  GENERAL APPEARANCE: Alert, conversant. Appropriately groomed. No acute distress.  SKIN: No diaphoresis  HEAD: Normocephalic, atraumatic  EYES: Conjunctiva/lids clear. Pupils round, reactive. EOMs intact.  EARS: External exam WNL, canals clear. Hearing grossly normal.  NOSE: No deformity or discharge.  MOUTH/THROAT: Lips w/o lesions. RESPIRATORY: Breathing is even, unlabored. Lung sounds are clear   CARDIOVASCULAR: Heart RRR no murmurs, rubs or gallops. No peripheral edema.   GASTROINTESTINAL: Abdomen is soft, non-tender, not distended w/ normal bowel sounds GENITOURINARY: Bladder non tender, not distended  MUSCULOSKELETAL: No abnormal joints or musculature NEUROLOGIC: Oriented X3. Cranial nerves 2-12 grossly intact. Moves all extremities no tremor. PSYCHIATRIC: Mood and affect appropriate to situation, no behavioral issues  Patient Active Problem List   Diagnosis Date Noted  . Weakness 07/13/2013  . Protein-calorie malnutrition, severe 06/30/2013  . Multiple rib fractures 06/30/2013  . Syncope 06/28/2013  . Atrial fibrillation with RVR 06/28/2013  . Allergic state 06/22/2013  . Facial skin lesion 06/22/2013  . Otitis, externa, infective 06/22/2013  . Loss of weight 01/19/2013  . Abdominal pain, unspecified site 08/17/2012  . Other and unspecified hyperlipidemia 08/17/2012  . Aortic insufficiency 09/22/2011  . Chest pain 08/14/2011  . Dystrophic nail 05/14/2011  . Neck pain 03/08/2011  . TMJ syndrome 03/08/2011  . Fatigue 02/11/2011  . Rhinitis 02/11/2011  . Alopecia 10/16/2010  . OSTEOPOROSIS 07/08/2008  . LUNG NODULE 06/03/2008  . VITAMIN D DEFICIENCY 10/30/2007  . GROSS HEMATURIA 03/13/2007  . MICROSCOPIC HEMATURIA 02/20/2007  . HYPERTENSION 02/14/2007    CBC    Component Value Date/Time   WBC 7.1 06/29/2013 0350   RBC 4.08 06/29/2013 0350   HGB 12.6 06/29/2013 0350   HCT 36.3 06/29/2013 0350   PLT 209 06/29/2013 0350   MCV  89.0 06/29/2013 0350   LYMPHSABS 0.8 06/28/2013 1530   MONOABS 0.7 06/28/2013 1530   EOSABS 0.0 06/28/2013 1530   BASOSABS 0.0 06/28/2013 1530    CMP     Component Value Date/Time   NA 139 07/02/2013 0602   K 4.2 07/02/2013 0602   CL 104 07/02/2013 0602   CO2 24 07/02/2013 0602   GLUCOSE 96 07/02/2013 0602   BUN 15 07/02/2013 0602   CREATININE 0.57 07/02/2013 0602   CREATININE 0.71 01/15/2013 1613   CALCIUM 8.5 07/02/2013 0602   PROT 7.0 06/28/2013 1626   ALBUMIN 3.7 06/28/2013 1626   AST 21 06/28/2013 1626   ALT 12 06/28/2013 1626   ALKPHOS 57 06/28/2013 1626   BILITOT 0.4 06/28/2013 1626   GFRNONAA 81* 07/02/2013 0602   GFRAA >90 07/02/2013 0602  Assessment and Plan  Atrial fibrillation with RVR -Has continued to go in and out of PAF on diltiazem drip after initial spontaneous conversion to sinus  -A.M. 06/30/2013--> patient went back to A. fib with RVR--> restarted diltiazem drip--HR 90-110  -Continue metoprolol tartrate  -Long discussion with patient's daughter regarding risks and benefits of anticoagulation versus aspirin  -CHADSVASc= 5  -Daughter expressed concern regarding the patient's fall risk and hesitant for anticoagulation--> start aspirin  -Transferred to telemetry unit  -Echocardiogram--EF 55-60%, moderate TR  -TSH--3.680  -appreciate cardiology input who felt rate control was more important at this time  -diltiazem CD 240mg  started  -pt spontaneously converted back to sinus and remained in sinus on day of dc   Syncope -Check orthostatics--negative  -Likely related to tachyarrhythmia  -09/08/2011 LexiScan negative for reversible ischemia, EF 65%  -Troponins negative x3    HYPERTENSION Coninue dilt and metoprolol  Weakness PT/OT  Multiple rib fractures Tramadol for pain  Protein-calorie malnutrition, severe Supplements    Hennie Duos, MD

## 2013-07-13 NOTE — Assessment & Plan Note (Signed)
-  Has continued to go in and out of PAF on diltiazem drip after initial spontaneous conversion to sinus  -A.M. 06/30/2013--> patient went back to A. fib with RVR--> restarted diltiazem drip--HR 90-110  -Continue metoprolol tartrate  -Long discussion with patient's daughter regarding risks and benefits of anticoagulation versus aspirin  -CHADSVASc= 5  -Daughter expressed concern regarding the patient's fall risk and hesitant for anticoagulation--> start aspirin  -Transferred to telemetry unit  -Echocardiogram--EF 55-60%, moderate TR  -TSH--3.680  -appreciate cardiology input who felt rate control was more important at this time  -diltiazem CD 240mg  started  -pt spontaneously converted back to sinus and remained in sinus on day of dc

## 2013-07-13 NOTE — Assessment & Plan Note (Signed)
Supplements. °

## 2013-07-13 NOTE — Assessment & Plan Note (Signed)
Tramadol for pain

## 2013-07-13 NOTE — Assessment & Plan Note (Signed)
-  Check orthostatics--negative  -Likely related to tachyarrhythmia  -09/08/2011 LexiScan negative for reversible ischemia, EF 65%  -Troponins negative x3

## 2013-07-13 NOTE — Assessment & Plan Note (Signed)
PT/OT

## 2013-07-13 NOTE — Assessment & Plan Note (Signed)
Coninue dilt and metoprolol

## 2013-07-16 ENCOUNTER — Encounter: Payer: Self-pay | Admitting: Nurse Practitioner

## 2013-07-16 ENCOUNTER — Ambulatory Visit (INDEPENDENT_AMBULATORY_CARE_PROVIDER_SITE_OTHER): Payer: Medicare Other | Admitting: Nurse Practitioner

## 2013-07-16 VITALS — BP 170/80 | Ht 61.0 in | Wt 109.0 lb

## 2013-07-16 DIAGNOSIS — I4891 Unspecified atrial fibrillation: Secondary | ICD-10-CM

## 2013-07-16 DIAGNOSIS — R55 Syncope and collapse: Secondary | ICD-10-CM | POA: Diagnosis not present

## 2013-07-16 NOTE — Progress Notes (Signed)
[name] Date of Birth: 10-21-1923 Medical Record #[id number]  History of Present Illness: Beth [name] is seen back today for a post hospital/TOC visit. Seen for Dr. Acie [name]. She is an 80 year old female with multiple medical issues. These include HTN, aortic insufficiency, AF, severe protein calorie malnutrition, and tobacco abuse. Has CAD with remote LHC with luminal irregularities. Negative Myoview in June of 2013.   Noted to have had past abnormal CT chest and abnormal CXR from several years ago - suspicious for lung cancer.   Most recently presented with a syncopal spell - noted to be in AF with RVR - had multiple rib fractures as the result of her syncope. Echo showed normal EF with mild AI, moderate TR. It was deemed that she would be managed with rate control and aspirin only. Not a candidate for anticoagulation given her falls, instability, etc. Her ACE was stopped due to medicine changes and soft BP. Discharged to SNF.   Comes back today. Here with her daughter, Beth [name]. Beth [name] is feeling better. Getting stronger. Wanting to go back to her home. Does live independently in an apartment. No chest pain. Not short of breath. Not smoking and thinking about staying quit. No palpitations. Reports that her blood pressure running low at the facility.    Current Outpatient Prescriptions  Medication Sig Dispense Refill  . aspirin 81 MG chewable tablet Chew 1 tablet (81 mg total) by mouth daily.  30 tablet  11  . calcium-vitamin D (OSCAL WITH D 500-200) 500-200 MG-UNIT per tablet Take 1 tablet by mouth daily with breakfast.       . cetirizine (ZYRTEC) 10 MG tablet Take 10 mg by mouth every morning.      . diltiazem (CARDIZEM CD) 240 MG 24 hr capsule Take 1 capsule (240 mg total) by mouth daily.  30 capsule  1  . fish oil-omega-3 fatty acids 1000 MG capsule Take 2 g by mouth daily.      . metoprolol succinate (TOPROL-XL) 25 MG 24 hr tablet Take 25 mg by mouth daily.      . Multiple  Vitamin (MULTIVITAMIN) tablet Take 1 tablet by mouth daily.        Marland Kitchen neomycin-polymyxin-hydrocortisone (CORTISPORIN) otic solution Place 4 drops into both ears at bedtime as needed (x 5 days as needed for itchy, pain, discharge from ears.).  10 mL  1  . Saline (SODIUM CHLORIDE) 0.65 % SOLN by Nasal route. 2 sprays each nostril once daily as needed       . traMADol (ULTRAM) 50 MG tablet Take 1 tablet (50 mg total) by mouth every 6 (six) hours as needed for moderate pain.  30 tablet  0   No current facility-administered medications for this visit.    Allergies  Allergen Reactions  . Ciprofloxacin Other (See Comments)    myalgias  . Fexofenadine Other (See Comments)    unknown  . Penicillins Other (See Comments)    Past Medical History  Diagnosis Date  . Hypertension   . Lung nodule     CT of chest 2007-left loewr lobar mass- evaluated by pulmonary- patient refused further work up  . Tobacco abuse   . Vertigo   . Abdominal pain, unspecified site 08/17/2012  . Other and unspecified hyperlipidemia 08/17/2012  . Loss of weight 01/19/2013  . Allergic state 06/22/2013  . Allergy   . Osteoporosis   . Syncope     fall    Past Surgical History  Procedure Laterality  Date  . Cholecystectomy    . Cesarean section    . Hernia repair    . Abdominal hysterectomy      History  Smoking status  . Current Every Day Smoker -- 0.25 packs/day for 60 years  Smokeless tobacco  . Never Used    History  Alcohol Use No    Family History  Problem Relation Age of Onset  . Heart disease Mother   . Hypertension Mother   . Diabetes Mother   . Diabetes    . Cancer      lung- in distant releatives  . Cancer Brother     Review of Systems: The review of systems is per the HPI.  All other systems were reviewed and are negative.  Physical Exam: Ht 5\' 1"  (1.549 m)  Wt 109 lb (49.442 kg)  BMI 20.61 kg/m2 BP was 170/80 by me - no medicines given today prior to her visit with me.  Patient is  very pleasant and in no acute distress. She is quite thin. Hard of hearing. Skin is warm and dry. Color is normal.  HEENT is unremarkable. Normocephalic/atraumatic. PERRL. Sclera are nonicteric. Neck is supple. No masses. No JVD. Lungs are coarse. Cardiac exam shows a fairly regular rate and rhythm. Abdomen is soft. Extremities are without edema. Gait and ROM are intact. No gross neurologic deficits noted.  Wt Readings from Last 3 Encounters:  07/16/13 109 lb (49.442 kg)  07/04/13 108 lb 6.4 oz (49.17 kg)  06/28/13 103 lb 6.3 oz (46.9 kg)     LABORATORY DATA: EKG pending   Lab Results  Component Value Date   WBC 7.1 06/29/2013   HGB 12.6 06/29/2013   HCT 36.3 06/29/2013   PLT 209 06/29/2013   GLUCOSE 96 07/02/2013   CHOL 222* 01/15/2013   TRIG 83 01/15/2013   HDL 72 01/15/2013   LDLDIRECT 127.9 10/05/2011   LDLCALC 133* 01/15/2013   ALT 12 06/28/2013   AST 21 06/28/2013   NA 139 07/02/2013   K 4.2 07/02/2013   CL 104 07/02/2013   CREATININE 0.57 07/02/2013   BUN 15 07/02/2013   CO2 24 07/02/2013   TSH 3.680 06/30/2013   INR 1.06 06/28/2013   Echo Study Conclusions  - Left ventricle: The cavity size was normal. Wall thickness was increased in a pattern of mild LVH. There was mild focal basal hypertrophy of the septum. Systolic function was normal. The estimated ejection fraction was in the range of 55% to 60%. Wall motion was normal; there were no regional wall motion abnormalities. - Aortic valve: Mild regurgitation. - Tricuspid valve: Moderate regurgitation. - Pulmonary arteries: Systolic pressure was mildly increased. PA peak pressure: 72mm Hg (S).    Assessment / Plan: 1. AF - managed with rate control and aspirin only - too high risk for anticoagulation - seems to be in a regular rhythm today - will check EKG. Not interested in wearing a heart monitor. Would worry about recurrent syncope/possible pauses. Daughter elects for conservative management.   2. Syncope - felt to be from AF  3.  Rib fractures  4. Tobacco abuse - not smoking  5. Abnormal past CXR/CT chest - suspicious for lung cancer - tells me that she was offered a biopsy but she has declined further evaluation.   6. Advanced age  Patient is agreeable to this plan and will call if any problems develop in the interim.   Burtis Junes, RN, [place] [phone]  [address] [place], [place]  [address] [phone]   Addendum:  EKG today shows sinus with LBBB - I suspect she is going in and out of AF - will manage conservatively. Have advised to have life alert when she returns to her apartment.

## 2013-07-16 NOTE — Patient Instructions (Signed)
Continue with your current medicines  One requirement to return home is to have a lifeline installed  See Dr. Acie Fredrickson back in 3 months  Call the [place] office at [phone] if you have any questions, problems or concerns.

## 2013-07-22 ENCOUNTER — Non-Acute Institutional Stay (SKILLED_NURSING_FACILITY): Payer: Medicare Other | Admitting: Adult Health

## 2013-07-22 ENCOUNTER — Encounter: Payer: Self-pay | Admitting: Adult Health

## 2013-07-22 DIAGNOSIS — E785 Hyperlipidemia, unspecified: Secondary | ICD-10-CM

## 2013-07-22 DIAGNOSIS — S2249XA Multiple fractures of ribs, unspecified side, initial encounter for closed fracture: Secondary | ICD-10-CM

## 2013-07-22 DIAGNOSIS — I10 Essential (primary) hypertension: Secondary | ICD-10-CM | POA: Diagnosis not present

## 2013-07-22 DIAGNOSIS — E43 Unspecified severe protein-calorie malnutrition: Secondary | ICD-10-CM

## 2013-07-22 DIAGNOSIS — R531 Weakness: Secondary | ICD-10-CM

## 2013-07-22 DIAGNOSIS — I4891 Unspecified atrial fibrillation: Secondary | ICD-10-CM | POA: Diagnosis not present

## 2013-07-22 DIAGNOSIS — R5381 Other malaise: Secondary | ICD-10-CM

## 2013-07-22 DIAGNOSIS — J309 Allergic rhinitis, unspecified: Secondary | ICD-10-CM

## 2013-07-22 DIAGNOSIS — R5383 Other fatigue: Secondary | ICD-10-CM

## 2013-07-22 NOTE — Progress Notes (Signed)
Patient ID: Beth [name], female   DOB: [birth year], 80 y.o.   MRN: [id number]                 PROGRESS NOTE  DATE: 07/22/13  FACILITY: Nursing Home Location: [facility]  LEVEL OF CARE: SNF (31)  Acute Visit  CHIEF COMPLAINT:  Discharge Notes  HISTORY OF PRESENT ILLNESS: This is an 80 year old female who is for discharge home with  Home health PT, OT and Nursing. DME: Bedside commode. She has been admitted to [facility] on 07/02/13 from [facility]. She had a Syncopal episode at home and sustained multiple right rib fractures (7th, 8th and 9th). Patient was admitted to this facility for short-term rehabilitation after the patient's recent hospitalization.  Patient has completed SNF rehabilitation and therapy has cleared the patient for discharge.   REASSESSMENT OF ONGOING PROBLEM(S):  HYPERLIPIDEMIA: No c omplications from the medications presently being used. 10/14  fasting lipid panel showed : cholesterol 222  triglycerides 83 HDL 72 LDL 133  ATRIAL FIBRILLATION: the patients atrial fibrillation remains stable.  The patient denies DOE, tachycardia, orthopnea, transient neurological sx, pedal edema, palpitations, & PNDs.  No complications noted from the medications currently being used.  HTN: Pt 's HTN remains stable.  Denies CP, sob, DOE, pedal edema, headaches, dizziness or visual disturbances.  No complications from the medications currently being used.  Last BP : 135/41  PAST MEDICAL HISTORY : Reviewed.  No changes.  CURRENT MEDICATIONS: Reviewed per [facility]  REVIEW OF SYSTEMS:  GENERAL: no change in appetite, no fatigue, no weight changes, no fever, chills or weakness RESPIRATORY: no cough, SOB, DOE, wheezing, hemoptysis CARDIAC: no chest pain, edema or palpitations GI: no abdominal pain, diarrhea, heart burn, nausea or vomiting, +constipation  PHYSICAL EXAMINATION  GENERAL: no acute distress, normal body habitus EARS: wears bilateral hearing  aids NECK: supple, trachea midline, no neck masses, no thyroid tenderness, no thyromegaly LYMPHATICS: no LAN in the neck, no supraclavicular LAN RESPIRATORY: breathing is even & unlabored, BS CTAB CARDIAC: RRR, no murmur,no extra heart sounds, no edema GI: abdomen soft, normal BS, no masses, no tenderness, no hepatomegaly, no splenomegaly EXTREMITIES: able to move all 4 extremities PSYCHIATRIC: the patient is alert & oriented to person, affect & behavior appropriate  LABS/RADIOLOGY: Labs reviewed: Basic Metabolic Panel:  Recent Labs  01/15/13 1613  06/30/13 0300 07/01/13 0526 07/02/13 0602  NA 142  < > 139 136* 139  K 4.1  < > 3.7 3.7 4.2  CL 105  < > 104 101 104  CO2 26  < > 19 20 24   GLUCOSE 97  < > 84 108* 96  BUN 8  < > 12 10 15   CREATININE 0.71  < > 0.52 0.47* 0.57  CALCIUM 10.0  < > 8.8 9.0 8.5  MG  --   --   --  1.9  --   PHOS 3.5  --   --   --   --   < > = values in this interval not displayed. Liver Function Tests:  Recent Labs  01/15/13 1613 06/28/13 1626  AST 18 21  ALT 10 12  ALKPHOS 63 57  BILITOT 0.5 0.4  PROT 7.3 7.0  ALBUMIN 4.4  4.4 3.7    CBC:  Recent Labs  01/15/13 1613 06/28/13 1530 06/29/13 0350  WBC 6.9 13.0* 7.1  NEUTROABS  --  11.5*  --   HGB 13.8 14.7 12.6  HCT 39.3 42.9  36.3  MCV 85.6 90.3 89.0  PLT 298 241 209   Lipid Panel:  Recent Labs  01/15/13 1613  HDL 72   Cardiac Enzymes:  Recent Labs  06/28/13 2330 06/29/13 0350 06/29/13 1140  TROPONINI <0.30 <0.30 <0.30    ASSESSMENT/PLAN:  Atrial Fibrillation - rate-controlled; continue Lopressor Multiple rib fractures S/P syncopal fall -  for Home health PT, OT and Nursing  Hypertension - well-controlled; continue Lopressor and Cardizem Allergic Rhinitis - continue Zyrtec Hyperlipidemia - continue Fish Oil Constipation - no complaints; continue Colace and Miralax Weakness - for Home health PT, Nursing and OT Protein-Calorie Malnutrition -  Continue  supplementation   I have filled out patient's discharge paperwork and written prescriptions.  Patient will receive home health PT, OT, ST, nursing and CNA.  DME provided: bedside commode  Total discharge time: Greater than 30 minutes Discharge time involved coordination of the discharge process with social worker, nursing staff and therapy department. Medical justification for home health services/DME verified.   CPT CODE: 47829    Seth Bake - NP [facility] [phone]

## 2013-07-29 DIAGNOSIS — R42 Dizziness and giddiness: Secondary | ICD-10-CM | POA: Diagnosis not present

## 2013-07-29 DIAGNOSIS — I359 Nonrheumatic aortic valve disorder, unspecified: Secondary | ICD-10-CM | POA: Diagnosis not present

## 2013-07-29 DIAGNOSIS — I10 Essential (primary) hypertension: Secondary | ICD-10-CM | POA: Diagnosis not present

## 2013-07-29 DIAGNOSIS — Z9181 History of falling: Secondary | ICD-10-CM | POA: Diagnosis not present

## 2013-07-29 DIAGNOSIS — E43 Unspecified severe protein-calorie malnutrition: Secondary | ICD-10-CM

## 2013-07-29 DIAGNOSIS — IMO0001 Reserved for inherently not codable concepts without codable children: Secondary | ICD-10-CM

## 2013-07-29 DIAGNOSIS — I4891 Unspecified atrial fibrillation: Secondary | ICD-10-CM | POA: Diagnosis not present

## 2013-07-30 ENCOUNTER — Ambulatory Visit: Payer: Medicare Other | Admitting: Family Medicine

## 2013-07-30 DIAGNOSIS — I359 Nonrheumatic aortic valve disorder, unspecified: Secondary | ICD-10-CM | POA: Diagnosis not present

## 2013-07-30 DIAGNOSIS — IMO0001 Reserved for inherently not codable concepts without codable children: Secondary | ICD-10-CM | POA: Diagnosis not present

## 2013-07-30 DIAGNOSIS — R42 Dizziness and giddiness: Secondary | ICD-10-CM | POA: Diagnosis not present

## 2013-07-30 DIAGNOSIS — E43 Unspecified severe protein-calorie malnutrition: Secondary | ICD-10-CM | POA: Diagnosis not present

## 2013-07-30 DIAGNOSIS — I10 Essential (primary) hypertension: Secondary | ICD-10-CM | POA: Diagnosis not present

## 2013-07-30 DIAGNOSIS — I4891 Unspecified atrial fibrillation: Secondary | ICD-10-CM | POA: Diagnosis not present

## 2013-07-31 DIAGNOSIS — IMO0001 Reserved for inherently not codable concepts without codable children: Secondary | ICD-10-CM | POA: Diagnosis not present

## 2013-07-31 DIAGNOSIS — I4891 Unspecified atrial fibrillation: Secondary | ICD-10-CM | POA: Diagnosis not present

## 2013-07-31 DIAGNOSIS — R42 Dizziness and giddiness: Secondary | ICD-10-CM | POA: Diagnosis not present

## 2013-07-31 DIAGNOSIS — I10 Essential (primary) hypertension: Secondary | ICD-10-CM | POA: Diagnosis not present

## 2013-07-31 DIAGNOSIS — I359 Nonrheumatic aortic valve disorder, unspecified: Secondary | ICD-10-CM | POA: Diagnosis not present

## 2013-07-31 DIAGNOSIS — E43 Unspecified severe protein-calorie malnutrition: Secondary | ICD-10-CM | POA: Diagnosis not present

## 2013-08-02 ENCOUNTER — Other Ambulatory Visit: Payer: Self-pay | Admitting: Family Medicine

## 2013-08-02 DIAGNOSIS — I4891 Unspecified atrial fibrillation: Secondary | ICD-10-CM | POA: Diagnosis not present

## 2013-08-02 DIAGNOSIS — E43 Unspecified severe protein-calorie malnutrition: Secondary | ICD-10-CM | POA: Diagnosis not present

## 2013-08-02 DIAGNOSIS — R42 Dizziness and giddiness: Secondary | ICD-10-CM | POA: Diagnosis not present

## 2013-08-02 DIAGNOSIS — I10 Essential (primary) hypertension: Secondary | ICD-10-CM | POA: Diagnosis not present

## 2013-08-02 DIAGNOSIS — I359 Nonrheumatic aortic valve disorder, unspecified: Secondary | ICD-10-CM | POA: Diagnosis not present

## 2013-08-02 DIAGNOSIS — IMO0001 Reserved for inherently not codable concepts without codable children: Secondary | ICD-10-CM | POA: Diagnosis not present

## 2013-08-06 DIAGNOSIS — L570 Actinic keratosis: Secondary | ICD-10-CM | POA: Diagnosis not present

## 2013-08-06 DIAGNOSIS — I10 Essential (primary) hypertension: Secondary | ICD-10-CM | POA: Diagnosis not present

## 2013-08-06 DIAGNOSIS — L821 Other seborrheic keratosis: Secondary | ICD-10-CM | POA: Diagnosis not present

## 2013-08-06 DIAGNOSIS — I359 Nonrheumatic aortic valve disorder, unspecified: Secondary | ICD-10-CM | POA: Diagnosis not present

## 2013-08-06 DIAGNOSIS — E43 Unspecified severe protein-calorie malnutrition: Secondary | ICD-10-CM | POA: Diagnosis not present

## 2013-08-06 DIAGNOSIS — D485 Neoplasm of uncertain behavior of skin: Secondary | ICD-10-CM | POA: Diagnosis not present

## 2013-08-06 DIAGNOSIS — IMO0001 Reserved for inherently not codable concepts without codable children: Secondary | ICD-10-CM | POA: Diagnosis not present

## 2013-08-06 DIAGNOSIS — R42 Dizziness and giddiness: Secondary | ICD-10-CM | POA: Diagnosis not present

## 2013-08-06 DIAGNOSIS — I4891 Unspecified atrial fibrillation: Secondary | ICD-10-CM | POA: Diagnosis not present

## 2013-08-07 ENCOUNTER — Telehealth: Payer: Self-pay | Admitting: *Deleted

## 2013-08-07 DIAGNOSIS — I4891 Unspecified atrial fibrillation: Secondary | ICD-10-CM | POA: Diagnosis not present

## 2013-08-07 DIAGNOSIS — IMO0001 Reserved for inherently not codable concepts without codable children: Secondary | ICD-10-CM | POA: Diagnosis not present

## 2013-08-07 DIAGNOSIS — I359 Nonrheumatic aortic valve disorder, unspecified: Secondary | ICD-10-CM | POA: Diagnosis not present

## 2013-08-07 DIAGNOSIS — E43 Unspecified severe protein-calorie malnutrition: Secondary | ICD-10-CM | POA: Diagnosis not present

## 2013-08-07 DIAGNOSIS — I10 Essential (primary) hypertension: Secondary | ICD-10-CM | POA: Diagnosis not present

## 2013-08-07 DIAGNOSIS — R42 Dizziness and giddiness: Secondary | ICD-10-CM | POA: Diagnosis not present

## 2013-08-07 NOTE — Telephone Encounter (Signed)
Received message from [place], [place] with [facility] stating pt has coarse lung sounds in LLL and fine crackles in RLL. Vitals stable:  BP 149/70, HR 64,  R 17,  O2  97% room air,  T  96.1.  Pt denies shortness of breath or cough.  Please advise?

## 2013-08-07 NOTE — Telephone Encounter (Signed)
She should probably be looked at. She can have an Albuterol HFA 2 puffs po q 6 hours prn sob/cough. Disp #1 1 rf. Mucinex 600 mg bid x 10 days (otc). Cefdinir 300 mg po bid x 10 days

## 2013-08-16 NOTE — Telephone Encounter (Signed)
So we should probably call and check on patient and if she is not any better can still send in antibiotic and Albuterol

## 2013-08-16 NOTE — Telephone Encounter (Signed)
Due to no symptoms will reassess at next visit

## 2013-08-16 NOTE — Telephone Encounter (Signed)
Pt was asymptomatic on 08/07/13 per Home Health. Per verbal from Provider, will hold off on meds at this time until further notice from [facility] after upcoming visit on 08/28/13.

## 2013-08-16 NOTE — Telephone Encounter (Signed)
Dr Charlett Blake, looks like this note did not get routed back to the [facility]. I spoke with RN, Nira Conn. She states they have only seen pt 1 time and that was on 08/07/13. Next visit is scheduled for 08/28/13. States she will see how pt is doing at that visit and let us know if additional concerns with coarse lung sounds / crackles.  Please advise if further recommendations.

## 2013-08-21 ENCOUNTER — Other Ambulatory Visit: Payer: Self-pay | Admitting: Adult Health

## 2013-08-23 ENCOUNTER — Other Ambulatory Visit: Payer: Self-pay | Admitting: Adult Health

## 2013-08-23 ENCOUNTER — Telehealth: Payer: Self-pay | Admitting: Family Medicine

## 2013-08-23 MED ORDER — DILTIAZEM HCL ER COATED BEADS 240 MG PO CP24: 240.0000 mg | ORAL_CAPSULE | Freq: Every day | ORAL | Status: DC

## 2013-08-23 NOTE — Telephone Encounter (Signed)
Patient is requesting a refill of diltazem sent to [facility] on [address]

## 2013-08-26 DIAGNOSIS — I359 Nonrheumatic aortic valve disorder, unspecified: Secondary | ICD-10-CM | POA: Diagnosis not present

## 2013-08-26 DIAGNOSIS — R42 Dizziness and giddiness: Secondary | ICD-10-CM | POA: Diagnosis not present

## 2013-08-26 DIAGNOSIS — E43 Unspecified severe protein-calorie malnutrition: Secondary | ICD-10-CM | POA: Diagnosis not present

## 2013-08-26 DIAGNOSIS — I4891 Unspecified atrial fibrillation: Secondary | ICD-10-CM | POA: Diagnosis not present

## 2013-08-26 DIAGNOSIS — I10 Essential (primary) hypertension: Secondary | ICD-10-CM | POA: Diagnosis not present

## 2013-08-26 DIAGNOSIS — IMO0001 Reserved for inherently not codable concepts without codable children: Secondary | ICD-10-CM | POA: Diagnosis not present

## 2013-09-03 ENCOUNTER — Ambulatory Visit (INDEPENDENT_AMBULATORY_CARE_PROVIDER_SITE_OTHER): Payer: Medicare Other | Admitting: Family

## 2013-09-03 ENCOUNTER — Encounter: Payer: Self-pay | Admitting: Family

## 2013-09-03 VITALS — BP 158/70 | HR 73 | Temp 97.7°F | Resp 16 | Ht 61.0 in | Wt 106.0 lb

## 2013-09-03 DIAGNOSIS — K5900 Constipation, unspecified: Secondary | ICD-10-CM | POA: Diagnosis not present

## 2013-09-03 DIAGNOSIS — R0789 Other chest pain: Secondary | ICD-10-CM

## 2013-09-03 NOTE — Progress Notes (Signed)
Subjective:    Patient ID: Beth [name], female    DOB: 07-20-1923, 80 y.o.   MRN: [id number]  HPI  Beth [name] is an 80 yr old female who presents today with chief complaint of constipation. She reports that she developed trouble with constipation after she was placed on Cardizem back in April for AF.  She denies associated abdominal pain, nausea or vomiting.  She reports that developed some pain up under the left arm and across the left anterior chest yesterday which worsened with movement. Attributed to straining for BM.    She describes stools as,  "small, hard rat pebbles."  Has tried miralax- last dose was 2 nights ago.  This AM she had 2 "little pellets" for a BM.  Reports good water intake and lots of veggies/salads.   Review of Systems See HPI  Past Medical History  Diagnosis Date  . Hypertension   . Lung nodule     CT of chest 2007-left loewr lobar mass- evaluated by pulmonary- patient refused further work up  . Tobacco abuse   . Vertigo   . Abdominal pain, unspecified site 08/17/2012  . Other and unspecified hyperlipidemia 08/17/2012  . Loss of weight 01/19/2013  . Allergic state 06/22/2013  . Allergy   . Osteoporosis   . Syncope     fall    History   Social History  . Marital Status: Widowed    Spouse Name: N/A    Number of Children: N/A  . Years of Education: N/A   Occupational History  . Not on file.   Social History Main Topics  . Smoking status: Former Smoker -- 0.25 packs/day for 60 years    Quit date: 07/05/2013  . Smokeless tobacco: Never Used  . Alcohol Use: No  . Drug Use: No  . Sexual Activity: No   Other Topics Concern  . Not on file   Social History Narrative  . No narrative on file    Past Surgical History  Procedure Laterality Date  . Cholecystectomy    . Cesarean section    . Hernia repair    . Abdominal hysterectomy      Family History  Problem Relation Age of Onset  . Heart disease Mother   . Hypertension Mother     . Diabetes Mother   . Diabetes    . Cancer      lung- in distant releatives  . Cancer Brother     Allergies  Allergen Reactions  . Ciprofloxacin Other (See Comments)    myalgias  . Fexofenadine Other (See Comments)    unknown  . Penicillins Other (See Comments)    Current Outpatient Prescriptions on File Prior to Visit  Medication Sig Dispense Refill  . aspirin 81 MG chewable tablet Chew 1 tablet (81 mg total) by mouth daily.  30 tablet  11  . calcium-vitamin D (OSCAL WITH D 500-200) 500-200 MG-UNIT per tablet Take 1 tablet by mouth daily with breakfast.       . cetirizine (ZYRTEC) 10 MG tablet Take 10 mg by mouth every morning.      . diltiazem (CARDIZEM CD) 240 MG 24 hr capsule Take 1 capsule (240 mg total) by mouth daily.  30 capsule  3  . fish oil-omega-3 fatty acids 1000 MG capsule Take 2 g by mouth daily.      . metoprolol succinate (TOPROL-XL) 25 MG 24 hr tablet TAKE 1 TABLET (25 MG TOTAL) BY MOUTH DAILY.  90 tablet  1  . Multiple Vitamin (MULTIVITAMIN) tablet Take 1 tablet by mouth daily.        . Saline (SODIUM CHLORIDE) 0.65 % SOLN by Nasal route. 2 sprays each nostril once daily as needed        No current facility-administered medications on file prior to visit.    BP 158/70  Pulse 73  Temp(Src) 97.7 F (36.5 C) (Oral)  Resp 16  Ht 5\' 1"  (1.549 m)  Wt 106 lb (48.081 kg)  BMI 20.04 kg/m2  SpO2 98%       Objective:   Physical Exam  Constitutional: She is oriented to person, place, and time. She appears well-developed and well-nourished. No distress.  HENT:  Head: Normocephalic and atraumatic.  Abdominal: Soft. Bowel sounds are decreased. There is no tenderness. There is no rigidity, no rebound and no guarding.  Left anterior chest wall tenderness to palpation  Musculoskeletal: She exhibits no edema.  Neurological: She is alert and oriented to person, place, and time.  Very hard of hearing  Skin: Skin is warm and dry.  Psychiatric: She has a normal  mood and affect. Her behavior is normal. Judgment and thought content normal.          Assessment & Plan:  25 minutes spent with pt today.  >50% of this time was spent counseling pt on constipation prevention, treatment and chest pain.

## 2013-09-03 NOTE — Progress Notes (Signed)
Pre visit review using our clinic review tool, if applicable. No additional management support is needed unless otherwise documented below in the visit note. 

## 2013-09-03 NOTE — Assessment & Plan Note (Addendum)
She attributes constipation to diltiazem. Will defer med changes to cardiology.  Recommended the following:  Try using a fleets enema this evening to help you empty your bowels.  If you do not have a good success with the fleets enema, then you can purchase a bottle of Magnesium Citrate over the counter and drink 1/2 bottle to help clean out your bowels.  After you have moved your bowels, you will likely need to add a small dose of miralax every day (1-2 tablespoons).  Titrate this as needed with goal of 1 soft BM a day. So if your stools become loose, you may cut back to every other day. If not having regular BM's you may need to increase dose of miralax.  Make sure to drink 6-8 glasses of water a day and eat lots of fresh fruits and veggies. Let us know if you are not successful after the above recommendations.

## 2013-09-03 NOTE — Assessment & Plan Note (Signed)
Reviewed EKG with Dr. Irish Lack- doc of the day for [facility] heartcare.  He feels that her EKG is consistent with prior.  Advised pt to go to the ER if she develops severe or worsening chest pain.

## 2013-09-03 NOTE — Patient Instructions (Addendum)
Try using a fleets enema this evening to help you empty your bowels.  If you do not have a good success with the fleets enema, then you can purchase a bottle of Magnesium Citrate over the counter and drink 1/2 bottle to help clean out your bowels.  After you have moved your bowels, you will likely need to add a small dose of miralax every day (1-2 tablespoons).  Titrate this as needed with goal of 1 soft BM a day. So if your stools become loose, you may cut back to every other day. If not having regular BM's you may need to increase dose of miralax.  Make sure to drink 6-8 glasses of water a day and eat lots of fresh fruits and veggies. Let us know if you are not successful after the above recommendations.  Go to the ER if you develop recurrent or worsening chest pain. Follow up with Dr. Charlett Blake in 3 months.

## 2013-09-20 ENCOUNTER — Other Ambulatory Visit: Payer: Self-pay | Admitting: Adult Health

## 2013-12-12 ENCOUNTER — Other Ambulatory Visit: Payer: Self-pay | Admitting: Family Medicine

## 2014-01-02 ENCOUNTER — Encounter: Payer: Self-pay | Admitting: Family Medicine

## 2014-01-02 ENCOUNTER — Ambulatory Visit (INDEPENDENT_AMBULATORY_CARE_PROVIDER_SITE_OTHER): Payer: Medicare Other | Admitting: Family Medicine

## 2014-01-02 VITALS — BP 156/46 | HR 67 | Temp 98.0°F | Ht 61.0 in | Wt 107.6 lb

## 2014-01-02 DIAGNOSIS — R350 Frequency of micturition: Secondary | ICD-10-CM

## 2014-01-02 DIAGNOSIS — I10 Essential (primary) hypertension: Secondary | ICD-10-CM

## 2014-01-02 DIAGNOSIS — E785 Hyperlipidemia, unspecified: Secondary | ICD-10-CM | POA: Diagnosis not present

## 2014-01-02 DIAGNOSIS — R634 Abnormal weight loss: Secondary | ICD-10-CM

## 2014-01-02 DIAGNOSIS — K5900 Constipation, unspecified: Secondary | ICD-10-CM | POA: Diagnosis not present

## 2014-01-02 HISTORY — DX: Frequency of micturition: R35.0

## 2014-01-02 LAB — URINALYSIS
Bilirubin Urine: NEGATIVE
KETONES UR: NEGATIVE
Leukocytes, UA: NEGATIVE
Nitrite: NEGATIVE
PH: 7 (ref 5.0–8.0)
Specific Gravity, Urine: <=1.005 — AB (ref 1.000–1.030)
Total Protein, Urine: NEGATIVE
Urine Glucose: NEGATIVE
Urobilinogen, UA: 0.2 (ref 0.0–1.0)

## 2014-01-02 LAB — RENAL FUNCTION PANEL
Albumin: 3.7 g/dL (ref 3.5–5.2)
BUN: 10 mg/dL (ref 6–23)
CO2: 26 meq/L (ref 19–32)
Calcium: 9.4 mg/dL (ref 8.4–10.5)
Chloride: 106 mEq/L (ref 96–112)
Creatinine, Ser: 0.6 mg/dL (ref 0.4–1.2)
GFR: 94.78 mL/min (ref >60.00)
GLUCOSE: 86 mg/dL (ref 70–99)
PHOSPHORUS: 3.4 mg/dL (ref 2.3–4.6)
Potassium: 4.5 mEq/L (ref 3.5–5.1)
SODIUM: 141 meq/L (ref 135–145)

## 2014-01-02 LAB — HEPATIC FUNCTION PANEL
ALBUMIN: 3.7 g/dL (ref 3.5–5.2)
ALT: 12 U/L (ref 0–35)
AST: 23 U/L (ref 0–37)
Alkaline Phosphatase: 60 U/L (ref 39–117)
BILIRUBIN TOTAL: 0.5 mg/dL (ref 0.2–1.2)
Bilirubin, Direct: 0 mg/dL (ref 0.0–0.3)
Total Protein: 7.6 g/dL (ref 6.0–8.3)

## 2014-01-02 LAB — CBC
HEMATOCRIT: 40.2 % (ref 36.0–46.0)
Hemoglobin: 13.4 g/dL (ref 12.0–15.0)
MCHC: 33.4 g/dL (ref 30.0–36.0)
MCV: 90.8 fl (ref 78.0–100.0)
PLATELETS: 270 10*3/uL (ref 150.0–400.0)
RBC: 4.42 Mil/uL (ref 3.87–5.11)
RDW: 14.1 % (ref 11.5–15.5)
WBC: 7.2 10*3/uL (ref 4.0–10.5)

## 2014-01-02 LAB — TSH: TSH: 2.51 u[IU]/mL (ref 0.35–4.50)

## 2014-01-02 MED ORDER — LOSARTAN POTASSIUM 25 MG PO TABS: 25.0000 mg | ORAL_TABLET | Freq: Every day | ORAL | Status: DC

## 2014-01-02 MED ORDER — DILTIAZEM HCL ER COATED BEADS 180 MG PO CP24: 180.0000 mg | ORAL_CAPSULE | Freq: Every day | ORAL | Status: DC

## 2014-01-02 NOTE — Progress Notes (Signed)
Pre visit review using our clinic review tool, if applicable. No additional management support is needed unless otherwise documented below in the visit note. 

## 2014-01-02 NOTE — Patient Instructions (Signed)
Encouraged increased hydration and fiber in diet. Daily probiotics. If bowels not moving can use MOM 2 tbls po in 4 oz of warm prune juice by mouth every 2-3 days. If no results then repeat in 4 hours with  Dulcolax suppository pr, may repeat again in 4 more hours as needed. Seek care if symptoms worsen. Consider daily Miralax and/or Dulcolax if symptoms persist.   Take Benefiber 3 x a day  Constipation Constipation is when a person has fewer than three bowel movements a week, has difficulty having a bowel movement, or has stools that are dry, hard, or larger than normal. As people grow older, constipation is more common. If you try to fix constipation with medicines that make you have a bowel movement (laxatives), the problem may get worse. Long-term laxative use may cause the muscles of the colon to become weak. A low-fiber diet, not taking in enough fluids, and taking certain medicines may make constipation worse.  CAUSES   Certain medicines, such as antidepressants, pain medicine, iron supplements, antacids, and water pills.   Certain diseases, such as diabetes, irritable bowel syndrome (IBS), thyroid disease, or depression.   Not drinking enough water.      Not eating enough fiber-rich foods.   Stress or travel.   Lack of physical activity or exercise.   Ignoring the urge to have a bowel movement.   Using laxatives too much.  SIGNS AND SYMPTOMS   Having fewer than three bowel movements a week.   Straining to have a bowel movement.   Having stools that are hard, dry, or larger than normal.   Feeling full or bloated.   Pain in the lower abdomen.   Not feeling relief after having a bowel movement.  DIAGNOSIS  Your health care provider will take a medical history and perform a physical exam. Further testing may be done for severe constipation. Some tests may include:  A barium enema X-ray to examine your rectum, colon, and, sometimes, your small intestine.    A sigmoidoscopy to examine your lower colon.   A colonoscopy to examine your entire colon. TREATMENT  Treatment will depend on the severity of your constipation and what is causing it. Some dietary treatments include drinking more fluids and eating more fiber-rich foods. Lifestyle treatments may include regular exercise. If these diet and lifestyle recommendations do not help, your health care provider may recommend taking over-the-counter laxative medicines to help you have bowel movements. Prescription medicines may be prescribed if over-the-counter medicines do not work.  HOME CARE INSTRUCTIONS   Eat foods that have a lot of fiber, such as fruits, vegetables, whole grains, and beans.  Limit foods high in fat and processed sugars, such as french fries, hamburgers, cookies, candies, and soda.   A fiber supplement may be added to your diet if you cannot get enough fiber from foods.   Drink enough fluids to keep your urine clear or pale yellow.   Exercise regularly or as directed by your health care provider.   Go to the restroom when you have the urge to go. Do not hold it.   Only take over-the-counter or prescription medicines as directed by your health care provider. Do not take other medicines for constipation without talking to your health care provider first.  [place] IF:   You have bright red blood in your stool.   Your constipation lasts for more than 4 days or gets worse.   You have abdominal or rectal pain.  You have thin, pencil-like stools.   You have unexplained weight loss. MAKE SURE YOU:   Understand these instructions.  Will watch your condition.  Will get help right away if you are not doing well or get worse. Document Released: 12/11/2003 Document Revised: 03/19/2013 Document Reviewed: 12/24/2012 [facility] Patient Information 2015 [place], [place]. This information is not intended to replace advice given to you by your health  care provider. Make sure you discuss any questions you have with your health care provider.

## 2014-01-03 LAB — URINE CULTURE
Colony Count: NO GROWTH
ORGANISM ID, BACTERIA: NO GROWTH

## 2014-01-05 ENCOUNTER — Encounter: Payer: Self-pay | Admitting: Family Medicine

## 2014-01-05 NOTE — Assessment & Plan Note (Signed)
Encouraged heart healthy diet, increase exercise, avoid trans fats, consider a krill oil cap daily 

## 2014-01-05 NOTE — Assessment & Plan Note (Addendum)
Improved on recheck. no changes to meds. Encouraged heart healthy diet such as the DASH diet and exercise as tolerated.  

## 2014-01-05 NOTE — Progress Notes (Signed)
Patient ID: Beth [name], female   DOB: [birth year], 80 y.o.   MRN: [id number] Beth [name] [id number] 10-Jun-1923 01/05/2014      Progress Note-Follow Up  Subjective  Chief Complaint  Chief Complaint  Patient presents with  . Follow-up    HPI  Patient is a 80 year old female in today for routine medical care. He is in today complaining of malaise and ongoing fatigue. Has had recent trouble constipation moving her bowels some Afrin every 2-3 days. No bloody or tarry stool. No nausea or vomiting. She technologist she is eating well but has still some anorexia. Finally note some mild pedal edema. Denies CP/palp/SOB/HA/congestion/fevers or GU c/o. Taking meds as prescribed  Past Medical History  Diagnosis Date  . Hypertension   . Lung nodule     CT of chest 2007-left loewr lobar mass- evaluated by pulmonary- patient refused further work up  . Tobacco abuse   . Vertigo   . Abdominal pain, unspecified site 08/17/2012  . Other and unspecified hyperlipidemia 08/17/2012  . Loss of weight 01/19/2013  . Allergic state 06/22/2013  . Allergy   . Osteoporosis   . Syncope     fall  . Increased urinary frequency 01/02/2014    Past Surgical History  Procedure Laterality Date  . Cholecystectomy    . Cesarean section    . Hernia repair    . Abdominal hysterectomy      Family History  Problem Relation Age of Onset  . Heart disease Mother   . Hypertension Mother   . Diabetes Mother   . Diabetes    . Cancer      lung- in distant releatives  . Cancer Brother     History   Social History  . Marital Status: Widowed    Spouse Name: N/A    Number of Children: N/A  . Years of Education: N/A   Occupational History  . Not on file.   Social History Main Topics  . Smoking status: Former Smoker -- 0.25 packs/day for 60 years    Quit date: 07/05/2013  . Smokeless tobacco: Never Used  . Alcohol Use: No  . Drug Use: No  . Sexual Activity: No   Other Topics Concern  . Not on  file   Social History Narrative  . No narrative on file    Current Outpatient Prescriptions on File Prior to Visit  Medication Sig Dispense Refill  . aspirin 81 MG chewable tablet Chew 1 tablet (81 mg total) by mouth daily.  30 tablet  11  . calcium-vitamin D (OSCAL WITH D 500-200) 500-200 MG-UNIT per tablet Take 1 tablet by mouth daily with breakfast.       . cetirizine (ZYRTEC) 10 MG tablet Take 10 mg by mouth every morning.      . fish oil-omega-3 fatty acids 1000 MG capsule Take 2 g by mouth daily.      . metoprolol succinate (TOPROL-XL) 25 MG 24 hr tablet TAKE 1 TABLET (25 MG TOTAL) BY MOUTH DAILY.  90 tablet  1  . Multiple Vitamin (MULTIVITAMIN) tablet Take 1 tablet by mouth daily.        . Saline (SODIUM CHLORIDE) 0.65 % SOLN by Nasal route. 2 sprays each nostril once daily as needed        No current facility-administered medications on file prior to visit.    Allergies  Allergen Reactions  . Ciprofloxacin Other (See Comments)    myalgias  . Fexofenadine Other (See Comments)  unknown  . Penicillins Other (See Comments)    Review of Systems  Review of Systems  Constitutional: Positive for malaise/fatigue. Negative for fever.  HENT: Negative for congestion.   Eyes: Negative for discharge.  Respiratory: Negative for shortness of breath.   Cardiovascular: Positive for leg swelling. Negative for chest pain and palpitations.  Gastrointestinal: Positive for constipation. Negative for nausea, abdominal pain, diarrhea, blood in stool and melena.  Genitourinary: Negative for dysuria.  Musculoskeletal: Negative for falls.  Skin: Negative for rash.  Neurological: Negative for loss of consciousness and headaches.  Endo/Heme/Allergies: Negative for polydipsia.  Psychiatric/Behavioral: Negative for depression and suicidal ideas. The patient is not nervous/anxious and does not have insomnia.     Objective  BP 156/46  Pulse 67  Temp(Src) 98 F (36.7 C) (Oral)  Ht 5\' 1"   (1.549 m)  Wt 107 lb 9.6 oz (48.807 kg)  BMI 20.34 kg/m2  SpO2 97%  Physical Exam  Physical Exam  Constitutional: She is oriented to person, place, and time and well-developed, well-nourished, and in no distress. No distress.  HENT:  Head: Normocephalic and atraumatic.  Eyes: Conjunctivae are normal.  Neck: Neck supple. No thyromegaly present.  Cardiovascular: Normal rate, regular rhythm and normal heart sounds.   No murmur heard. Pulmonary/Chest: Effort normal and breath sounds normal. She has no wheezes.  Abdominal: She exhibits no distension and no mass.  Musculoskeletal: She exhibits edema.  Lymphadenopathy:    She has no cervical adenopathy.  Neurological: She is alert and oriented to person, place, and time.  Skin: Skin is warm and dry. No rash noted. She is not diaphoretic.  Psychiatric: Memory, affect and judgment normal.    Lab Results  Component Value Date   TSH 2.51 01/02/2014   Lab Results  Component Value Date   WBC 7.2 01/02/2014   HGB 13.4 01/02/2014   HCT 40.2 01/02/2014   MCV 90.8 01/02/2014   PLT 270.0 01/02/2014   Lab Results  Component Value Date   CREATININE 0.6 01/02/2014   BUN 10 01/02/2014   NA 141 01/02/2014   K 4.5 01/02/2014   CL 106 01/02/2014   CO2 26 01/02/2014   Lab Results  Component Value Date   ALT 12 01/02/2014   AST 23 01/02/2014   ALKPHOS 60 01/02/2014   BILITOT 0.5 01/02/2014   Lab Results  Component Value Date   CHOL 222* 01/15/2013   Lab Results  Component Value Date   HDL 72 01/15/2013   Lab Results  Component Value Date   LDLCALC 133* 01/15/2013   Lab Results  Component Value Date   TRIG 83 01/15/2013   Lab Results  Component Value Date   CHOLHDL 3.1 01/15/2013     Assessment & Plan  Constipation Encouraged increased hydration and fiber in diet. Daily probiotics. If bowels not moving can use MOM 2 tbls po in 4 oz of warm prune juice by mouth every 2-3 days. If no results then repeat in 4 hours with  Dulcolax  suppository pr, may repeat again in 4 more hours as needed. Seek care if symptoms worsen. Consider daily Miralax and/or Dulcolax if symptoms persist.   Essential hypertension Improved on recheck. no changes to meds. Encouraged heart healthy diet such as the DASH diet and exercise as tolerated.   Hyperlipidemia Encouraged heart healthy diet, increase exercise, avoid trans fats, consider a krill oil cap daily  Loss of weight Slight weight gain since last visit. Encouraged good protein intake

## 2014-01-05 NOTE — Assessment & Plan Note (Signed)
Encouraged increased hydration and fiber in diet. Daily probiotics. If bowels not moving can use MOM 2 tbls po in 4 oz of warm prune juice by mouth every 2-3 days. If no results then repeat in 4 hours with  Dulcolax suppository pr, may repeat again in 4 more hours as needed. Seek care if symptoms worsen. Consider daily Miralax and/or Dulcolax if symptoms persist.  

## 2014-01-05 NOTE — Assessment & Plan Note (Signed)
Slight weight gain since last visit. Encouraged good protein intake

## 2014-02-01 ENCOUNTER — Other Ambulatory Visit: Payer: Self-pay | Admitting: Family Medicine

## 2014-02-06 ENCOUNTER — Ambulatory Visit (INDEPENDENT_AMBULATORY_CARE_PROVIDER_SITE_OTHER): Payer: Medicare Other | Admitting: Family Medicine

## 2014-02-06 ENCOUNTER — Encounter: Payer: Self-pay | Admitting: Family Medicine

## 2014-02-06 VITALS — BP 114/68 | HR 73 | Temp 98.0°F | Ht 61.0 in | Wt 106.8 lb

## 2014-02-06 DIAGNOSIS — I10 Essential (primary) hypertension: Secondary | ICD-10-CM | POA: Diagnosis not present

## 2014-02-06 DIAGNOSIS — E785 Hyperlipidemia, unspecified: Secondary | ICD-10-CM

## 2014-02-06 DIAGNOSIS — R634 Abnormal weight loss: Secondary | ICD-10-CM | POA: Diagnosis not present

## 2014-02-06 DIAGNOSIS — K5900 Constipation, unspecified: Secondary | ICD-10-CM | POA: Diagnosis not present

## 2014-02-06 MED ORDER — DILTIAZEM HCL ER COATED BEADS 240 MG PO CP24: 240.0000 mg | ORAL_CAPSULE | Freq: Every day | ORAL | Status: DC

## 2014-02-06 NOTE — Patient Instructions (Addendum)
Try compression knee hi socks on in am off in pm. Ice and apply Aspercreme or Salon Pas gel or patch  Restart the Losartan 25mg  daily   Peripheral Edema You have swelling in your legs (peripheral edema). This swelling is due to excess accumulation of salt and water in your body. Edema may be a sign of heart, kidney or liver disease, or a side effect of a medication. It may also be due to problems in the leg veins. Elevating your legs and using special support stockings may be very helpful, if the cause of the swelling is due to poor venous circulation. Avoid long periods of standing, whatever the cause. Treatment of edema depends on identifying the cause. Chips, pretzels, pickles and other salty foods should be avoided. Restricting salt in your diet is almost always needed. Water pills (diuretics) are often used to remove the excess salt and water from your body via urine. These medicines prevent the kidney from reabsorbing sodium. This increases urine flow. Diuretic treatment may also result in lowering of potassium levels in your body. Potassium supplements may be needed if you have to use diuretics daily. Daily weights can help you keep track of your progress in clearing your edema. You should call your caregiver for follow up care as recommended. SEEK IMMEDIATE MEDICAL CARE IF:   You have increased swelling, pain, redness, or heat in your legs.  You develop shortness of breath, especially when lying down.  You develop chest or abdominal pain, weakness, or fainting.  You have a fever. Document Released: 04/21/2004 Document Revised: 06/06/2011 Document Reviewed: 04/01/2009 [facility] Patient Information 2015 [place], [place]. This information is not intended to replace advice given to you by your health care provider. Make sure you discuss any questions you have with your health care provider.  twice daiyl and elevate feet above heart for 15 minutes twice a day.

## 2014-02-09 ENCOUNTER — Encounter: Payer: Self-pay | Admitting: Family Medicine

## 2014-02-09 NOTE — Assessment & Plan Note (Signed)
Stable, encouraged hi protein diet.

## 2014-02-09 NOTE — Progress Notes (Signed)
Beth [name]  [id number] October 09, 1923 02/09/2014      Progress Note-Follow Up  Subjective  Chief Complaint  Chief Complaint  Patient presents with  . Follow-up    4 week    HPI  Patient is a 80 y.o. female in today for routine medical care. Continues to struggle with joint pain but no recent falls or injury. Continues to struggle with edema in right lower extremity but it improves over night. No calf pain, no recent illness. Denies CP/palp/SOB/HA/congestion/fevers/GI or GU c/o. Taking meds as prescribed  Past Medical History  Diagnosis Date  . Hypertension   . Lung nodule     CT of chest 2007-left loewr lobar mass- evaluated by pulmonary- patient refused further work up  . Tobacco abuse   . Vertigo   . Abdominal pain, unspecified site 08/17/2012  . Other and unspecified hyperlipidemia 08/17/2012  . Loss of weight 01/19/2013  . Allergic state 06/22/2013  . Allergy   . Osteoporosis   . Syncope     fall  . Increased urinary frequency 01/02/2014    Past Surgical History  Procedure Laterality Date  . Cholecystectomy    . Cesarean section    . Hernia repair    . Abdominal hysterectomy      Family History  Problem Relation Age of Onset  . Heart disease Mother   . Hypertension Mother   . Diabetes Mother   . Diabetes    . Cancer      lung- in distant releatives  . Cancer Brother     History   Social History  . Marital Status: Widowed    Spouse Name: N/A    Number of Children: N/A  . Years of Education: N/A   Occupational History  . Not on file.   Social History Main Topics  . Smoking status: Former Smoker -- 0.25 packs/day for 60 years    Quit date: 07/05/2013  . Smokeless tobacco: Never Used  . Alcohol Use: No  . Drug Use: No  . Sexual Activity: No   Other Topics Concern  . Not on file   Social History Narrative    Current Outpatient Prescriptions on File Prior to Visit  Medication Sig Dispense Refill  . aspirin 81 MG chewable tablet Chew 1  tablet (81 mg total) by mouth daily. 30 tablet 11  . calcium-vitamin D (OSCAL WITH D 500-200) 500-200 MG-UNIT per tablet Take 1 tablet by mouth daily with breakfast.     . cetirizine (ZYRTEC) 10 MG tablet Take 10 mg by mouth every morning.    . fish oil-omega-3 fatty acids 1000 MG capsule Take 2 g by mouth daily.    Marland Kitchen losartan (COZAAR) 25 MG tablet Take 1 tablet (25 mg total) by mouth daily. 30 tablet 2  . metoprolol succinate (TOPROL-XL) 25 MG 24 hr tablet TAKE 1 TABLET (25 MG TOTAL) BY MOUTH DAILY. 90 tablet 1  . Multiple Vitamin (MULTIVITAMIN) tablet Take 1 tablet by mouth daily.      . Saline (SODIUM CHLORIDE) 0.65 % SOLN by Nasal route. 2 sprays each nostril once daily as needed      No current facility-administered medications on file prior to visit.    Allergies  Allergen Reactions  . Ciprofloxacin Other (See Comments)    myalgias  . Fexofenadine Other (See Comments)    unknown  . Penicillins Other (See Comments)    Review of Systems  Review of Systems  Constitutional: Negative for fever and malaise/fatigue.  HENT: Negative for congestion.   Eyes: Negative for discharge.  Respiratory: Negative for shortness of breath.   Cardiovascular: Negative for chest pain, palpitations and leg swelling.  Gastrointestinal: Negative for nausea, abdominal pain and diarrhea.  Genitourinary: Negative for dysuria.  Musculoskeletal: Positive for joint pain. Negative for falls.  Skin: Negative for rash.  Neurological: Negative for loss of consciousness and headaches.  Endo/Heme/Allergies: Negative for polydipsia.  Psychiatric/Behavioral: Negative for depression and suicidal ideas. The patient is not nervous/anxious and does not have insomnia.     Objective  BP 114/68 mmHg  Pulse 73  Temp(Src) 98 F (36.7 C) (Oral)  Ht 5\' 1"  (1.549 m)  Wt 106 lb 12.8 oz (48.444 kg)  BMI 20.19 kg/m2  SpO2 98%  Physical Exam  Physical Exam  Constitutional: She is oriented to person, place, and time  and well-developed, well-nourished, and in no distress. No distress.  HENT:  Head: Normocephalic and atraumatic.  Eyes: Conjunctivae are normal.  Neck: Neck supple. No thyromegaly present.  Cardiovascular: Normal rate and regular rhythm.   Murmur heard. Pulmonary/Chest: Effort normal and breath sounds normal. She has no wheezes.  Abdominal: She exhibits no distension and no mass.  Musculoskeletal: She exhibits no edema.  Lymphadenopathy:    She has no cervical adenopathy.  Neurological: She is alert and oriented to person, place, and time.  Skin: Skin is warm and dry. No rash noted. She is not diaphoretic.  Psychiatric: Memory, affect and judgment normal.    Lab Results  Component Value Date   TSH 2.51 01/02/2014   Lab Results  Component Value Date   WBC 7.2 01/02/2014   HGB 13.4 01/02/2014   HCT 40.2 01/02/2014   MCV 90.8 01/02/2014   PLT 270.0 01/02/2014   Lab Results  Component Value Date   CREATININE 0.6 01/02/2014   BUN 10 01/02/2014   NA 141 01/02/2014   K 4.5 01/02/2014   CL 106 01/02/2014   CO2 26 01/02/2014   Lab Results  Component Value Date   ALT 12 01/02/2014   AST 23 01/02/2014   ALKPHOS 60 01/02/2014   BILITOT 0.5 01/02/2014   Lab Results  Component Value Date   CHOL 222* 01/15/2013   Lab Results  Component Value Date   HDL 72 01/15/2013   Lab Results  Component Value Date   LDLCALC 133* 01/15/2013   Lab Results  Component Value Date   TRIG 83 01/15/2013   Lab Results  Component Value Date   CHOLHDL 3.1 01/15/2013     Assessment & Plan  Essential hypertension Well controlled, no changes to meds. Encouraged heart healthy diet such as the DASH diet and exercise as tolerated.   Constipation Encouraged increased hydration and fiber in diet. Daily probiotics. If bowels not moving can use MOM 2 tbls po in 4 oz of warm prune juice by mouth every 2-3 days. If no results then repeat in 4 hours with  Dulcolax suppository pr, may repeat  again in 4 more hours as needed. Seek care if symptoms worsen. Consider daily Miralax and/or Dulcolax if symptoms persist.   Hyperlipidemia Encouraged heart healthy diet, increase exercise, avoid trans fats, consider a krill oil cap daily  Loss of weight Stable, encouraged hi protein diet.

## 2014-02-09 NOTE — Assessment & Plan Note (Signed)
Encouraged heart healthy diet, increase exercise, avoid trans fats, consider a krill oil cap daily 

## 2014-02-09 NOTE — Assessment & Plan Note (Signed)
Well controlled, no changes to meds. Encouraged heart healthy diet such as the DASH diet and exercise as tolerated.  °

## 2014-02-09 NOTE — Assessment & Plan Note (Signed)
Encouraged increased hydration and fiber in diet. Daily probiotics. If bowels not moving can use MOM 2 tbls po in 4 oz of warm prune juice by mouth every 2-3 days. If no results then repeat in 4 hours with  Dulcolax suppository pr, may repeat again in 4 more hours as needed. Seek care if symptoms worsen. Consider daily Miralax and/or Dulcolax if symptoms persist.  

## 2014-03-30 ENCOUNTER — Other Ambulatory Visit: Payer: Self-pay | Admitting: Family Medicine

## 2014-03-31 NOTE — Telephone Encounter (Signed)
Metoprolol refilled per protocol. JG//CMA 

## 2014-04-11 ENCOUNTER — Ambulatory Visit (INDEPENDENT_AMBULATORY_CARE_PROVIDER_SITE_OTHER): Payer: Medicare Other | Admitting: Family Medicine

## 2014-04-11 ENCOUNTER — Encounter: Payer: Self-pay | Admitting: Family Medicine

## 2014-04-11 VITALS — BP 142/72 | HR 80 | Temp 98.0°F | Resp 16 | Wt 107.8 lb

## 2014-04-11 DIAGNOSIS — I10 Essential (primary) hypertension: Secondary | ICD-10-CM | POA: Diagnosis not present

## 2014-04-11 DIAGNOSIS — H547 Unspecified visual loss: Secondary | ICD-10-CM

## 2014-04-11 DIAGNOSIS — F329 Major depressive disorder, single episode, unspecified: Secondary | ICD-10-CM

## 2014-04-11 DIAGNOSIS — T7840XS Allergy, unspecified, sequela: Secondary | ICD-10-CM | POA: Diagnosis not present

## 2014-04-11 DIAGNOSIS — R32 Unspecified urinary incontinence: Secondary | ICD-10-CM | POA: Diagnosis not present

## 2014-04-11 DIAGNOSIS — R310 Gross hematuria: Secondary | ICD-10-CM

## 2014-04-11 DIAGNOSIS — F32A Depression, unspecified: Secondary | ICD-10-CM

## 2014-04-11 LAB — CBC
HEMATOCRIT: 38.7 % (ref 36.0–46.0)
Hemoglobin: 13.5 g/dL (ref 12.0–15.0)
MCH: 30.7 pg (ref 26.0–34.0)
MCHC: 34.9 g/dL (ref 30.0–36.0)
MCV: 88 fL (ref 78.0–100.0)
MPV: 9.6 fL (ref 8.6–12.4)
Platelets: 320 10*3/uL (ref 150–400)
RBC: 4.4 MIL/uL (ref 3.87–5.11)
RDW: 13.3 % (ref 11.5–15.5)
WBC: 8.3 10*3/uL (ref 4.0–10.5)

## 2014-04-11 LAB — COMPREHENSIVE METABOLIC PANEL
ALBUMIN: 4.1 g/dL (ref 3.5–5.2)
ALK PHOS: 69 U/L (ref 39–117)
ALT: 13 U/L (ref 0–35)
AST: 21 U/L (ref 0–37)
BILIRUBIN TOTAL: 0.5 mg/dL (ref 0.2–1.2)
BUN: 10 mg/dL (ref 6–23)
CO2: 27 meq/L (ref 19–32)
Calcium: 10.1 mg/dL (ref 8.4–10.5)
Chloride: 103 mEq/L (ref 96–112)
Creat: 0.57 mg/dL (ref 0.50–1.10)
Glucose, Bld: 86 mg/dL (ref 70–99)
POTASSIUM: 4.7 meq/L (ref 3.5–5.3)
Sodium: 140 mEq/L (ref 135–145)
Total Protein: 7.3 g/dL (ref 6.0–8.3)

## 2014-04-11 LAB — TSH: TSH: 3.445 u[IU]/mL (ref 0.350–4.500)

## 2014-04-11 MED ORDER — POLYMYXIN B-TRIMETHOPRIM 10000-0.1 UNIT/ML-% OP SOLN: 2.0000 [drp] | Freq: Four times a day (QID) | OPHTHALMIC | Status: DC

## 2014-04-11 NOTE — Progress Notes (Signed)
Pre visit review using our clinic review tool, if applicable. No additional management support is needed unless otherwise documented below in the visit note. 

## 2014-04-11 NOTE — Progress Notes (Signed)
Beth [name]  [id number] 1923/06/01 04/11/2014      Progress Note-Follow Up  Subjective  Chief Complaint  Chief Complaint  Patient presents with  . Follow-up    blood pressure  . Eye Problem    watering and itching x 2 days  . Nasal Congestion    nose running x 2 days    HPI  Patient is a 81 y.o. female in today for routine medical care. Patient in today for follow up. Major complaint is itchy watery eyes worse on the Right than the left. She also notes clear runny nose. NO fevers, cough, chest congestion. Denies CP/palp/SOB/HA/fevers/GI or GU c/o. Taking meds as prescribed  Past Medical History  Diagnosis Date  . Hypertension   . Lung nodule     CT of chest 2007-left loewr lobar mass- evaluated by pulmonary- patient refused further work up  . Tobacco abuse   . Vertigo   . Abdominal pain, unspecified site 08/17/2012  . Other and unspecified hyperlipidemia 08/17/2012  . Loss of weight 01/19/2013  . Allergic state 06/22/2013  . Allergy   . Osteoporosis   . Syncope     fall  . Increased urinary frequency 01/02/2014    Past Surgical History  Procedure Laterality Date  . Cholecystectomy    . Cesarean section    . Hernia repair    . Abdominal hysterectomy      Family History  Problem Relation Age of Onset  . Heart disease Mother   . Hypertension Mother   . Diabetes Mother   . Diabetes    . Cancer      lung- in distant releatives  . Cancer Brother     History   Social History  . Marital Status: Widowed    Spouse Name: N/A    Number of Children: N/A  . Years of Education: N/A   Occupational History  . Not on file.   Social History Main Topics  . Smoking status: Former Smoker -- 0.25 packs/day for 60 years    Quit date: 07/05/2013  . Smokeless tobacco: Never Used  . Alcohol Use: No  . Drug Use: No  . Sexual Activity: No   Other Topics Concern  . Not on file   Social History Narrative    Current Outpatient Prescriptions on File Prior to  Visit  Medication Sig Dispense Refill  . aspirin 81 MG chewable tablet Chew 1 tablet (81 mg total) by mouth daily. 30 tablet 11  . calcium-vitamin D (OSCAL WITH D 500-200) 500-200 MG-UNIT per tablet Take 1 tablet by mouth daily with breakfast.     . cetirizine (ZYRTEC) 10 MG tablet Take 10 mg by mouth every morning.    . diltiazem (CARDIZEM CD) 240 MG 24 hr capsule Take 1 capsule (240 mg total) by mouth daily. 90 capsule 0  . fish oil-omega-3 fatty acids 1000 MG capsule Take 2 g by mouth daily.    Marland Kitchen losartan (COZAAR) 25 MG tablet Take 1 tablet (25 mg total) by mouth daily. 30 tablet 2  . metoprolol succinate (TOPROL-XL) 25 MG 24 hr tablet TAKE 1 TABLET (25 MG TOTAL) BY MOUTH DAILY. 90 tablet 1  . Multiple Vitamin (MULTIVITAMIN) tablet Take 1 tablet by mouth daily.      . Saline (SODIUM CHLORIDE) 0.65 % SOLN by Nasal route. 2 sprays each nostril once daily as needed      No current facility-administered medications on file prior to visit.    Allergies  Allergen  Reactions  . Ciprofloxacin Other (See Comments)    myalgias  . Fexofenadine Other (See Comments)    unknown  . Penicillins Other (See Comments)    Review of Systems  Review of Systems  Constitutional: Negative for fever and malaise/fatigue.  HENT: Positive for congestion. Negative for ear pain, sore throat and tinnitus.   Eyes: Positive for discharge. Negative for blurred vision, double vision, photophobia, pain and redness.  Respiratory: Negative for shortness of breath.   Cardiovascular: Negative for chest pain, palpitations and leg swelling.  Gastrointestinal: Negative for nausea, abdominal pain and diarrhea.  Genitourinary: Negative for dysuria.  Musculoskeletal: Negative for falls.  Skin: Negative for rash.  Neurological: Negative for loss of consciousness and headaches.  Endo/Heme/Allergies: Negative for polydipsia.  Psychiatric/Behavioral: Positive for depression. Negative for suicidal ideas. The patient is  nervous/anxious. The patient does not have insomnia.     Objective  BP 148/72 mmHg  Pulse 80  Temp(Src) 98 F (36.7 C) (Oral)  Resp 16  Wt 107 lb 12.8 oz (48.898 kg)  SpO2 93%  Physical Exam  Physical Exam  Constitutional: She is oriented to person, place, and time and well-developed, well-nourished, and in no distress. No distress.  Frail, caucasian female  HENT:  Head: Normocephalic and atraumatic.  Eyes: Conjunctivae are normal.  Neck: Neck supple. No thyromegaly present.  Cardiovascular: Normal rate, regular rhythm and normal heart sounds.   No murmur heard. Pulmonary/Chest: Effort normal and breath sounds normal. She has no wheezes.  Abdominal: She exhibits no distension and no mass.  Musculoskeletal: She exhibits no edema.  Lymphadenopathy:    She has no cervical adenopathy.  Neurological: She is alert and oriented to person, place, and time.  Skin: Skin is warm and dry. No rash noted. She is not diaphoretic.  Psychiatric: Memory, affect and judgment normal.    Lab Results  Component Value Date   TSH 2.51 01/02/2014   Lab Results  Component Value Date   WBC 7.2 01/02/2014   HGB 13.4 01/02/2014   HCT 40.2 01/02/2014   MCV 90.8 01/02/2014   PLT 270.0 01/02/2014   Lab Results  Component Value Date   CREATININE 0.6 01/02/2014   BUN 10 01/02/2014   NA 141 01/02/2014   K 4.5 01/02/2014   CL 106 01/02/2014   CO2 26 01/02/2014   Lab Results  Component Value Date   ALT 12 01/02/2014   AST 23 01/02/2014   ALKPHOS 60 01/02/2014   BILITOT 0.5 01/02/2014   Lab Results  Component Value Date   CHOL 222* 01/15/2013   Lab Results  Component Value Date   HDL 72 01/15/2013   Lab Results  Component Value Date   LDLCALC 133* 01/15/2013   Lab Results  Component Value Date   TRIG 83 01/15/2013   Lab Results  Component Value Date   CHOLHDL 3.1 01/15/2013     Assessment & Plan  Essential hypertension Well controlled, no changes to meds. Encouraged  heart healthy diet such as the DASH diet and exercise as tolerated.    Depression Patient does not believe she is struggles with significant depression and does not want to consider further medications.   GROSS HEMATURIA Urinalysis clear today   Allergic state Encouraged to consider Flonase daily

## 2014-04-11 NOTE — Patient Instructions (Addendum)
Try a salon pas patch to hip up to twice a day   Conjunctivitis Conjunctivitis is commonly called "pink eye." Conjunctivitis can be caused by bacterial or viral infection, allergies, or injuries. There is usually redness of the lining of the eye, itching, discomfort, and sometimes discharge. There may be deposits of matter along the eyelids. A viral infection usually causes a watery discharge, while a bacterial infection causes a yellowish, thick discharge. Pink eye is very contagious and spreads by direct contact. You may be given antibiotic eyedrops as part of your treatment. Before using your eye medicine, remove all drainage from the eye by washing gently with warm water and cotton balls. Continue to use the medication until you have awakened 2 mornings in a row without discharge from the eye. Do not rub your eye. This increases the irritation and helps spread infection. Use separate towels from other household members. Wash your hands with soap and water before and after touching your eyes. Use cold compresses to reduce pain and sunglasses to relieve irritation from light. Do not wear contact lenses or wear eye makeup until the infection is gone. SEEK MEDICAL CARE IF:   Your symptoms are not better after 3 days of treatment.  You have increased pain or trouble seeing.  The outer eyelids become very red or swollen. Document Released: 04/21/2004 Document Revised: 06/06/2011 Document Reviewed: 03/14/2005 [facility] Patient Information 2015 [place], [place]. This information is not intended to replace advice given to you by your health care provider. Make sure you discuss any questions you have with your health care provider.

## 2014-04-12 LAB — URINALYSIS
Bilirubin Urine: NEGATIVE
Glucose, UA: NEGATIVE mg/dL
Hgb urine dipstick: NEGATIVE
Ketones, ur: NEGATIVE mg/dL
LEUKOCYTES UA: NEGATIVE
NITRITE: NEGATIVE
PROTEIN: NEGATIVE mg/dL
Specific Gravity, Urine: 1.006 (ref 1.005–1.030)
UROBILINOGEN UA: 0.2 mg/dL (ref 0.0–1.0)
pH: 7.5 (ref 5.0–8.0)

## 2014-04-13 LAB — URINE CULTURE
Colony Count: NO GROWTH
Organism ID, Bacteria: NO GROWTH

## 2014-04-14 ENCOUNTER — Encounter: Payer: Self-pay | Admitting: *Deleted

## 2014-04-16 DIAGNOSIS — H40053 Ocular hypertension, bilateral: Secondary | ICD-10-CM | POA: Diagnosis not present

## 2014-04-16 DIAGNOSIS — H02831 Dermatochalasis of right upper eyelid: Secondary | ICD-10-CM | POA: Diagnosis not present

## 2014-04-16 DIAGNOSIS — H524 Presbyopia: Secondary | ICD-10-CM | POA: Diagnosis not present

## 2014-04-16 DIAGNOSIS — H2513 Age-related nuclear cataract, bilateral: Secondary | ICD-10-CM | POA: Diagnosis not present

## 2014-04-20 DIAGNOSIS — F32A Depression, unspecified: Secondary | ICD-10-CM | POA: Insufficient documentation

## 2014-04-20 DIAGNOSIS — F329 Major depressive disorder, single episode, unspecified: Secondary | ICD-10-CM | POA: Insufficient documentation

## 2014-04-20 NOTE — Assessment & Plan Note (Signed)
Encouraged to consider Flonase daily

## 2014-04-20 NOTE — Assessment & Plan Note (Signed)
Well controlled, no changes to meds. Encouraged heart healthy diet such as the DASH diet and exercise as tolerated.  °

## 2014-04-20 NOTE — Assessment & Plan Note (Signed)
Patient does not believe she is struggles with significant depression and does not want to consider further medications.

## 2014-04-20 NOTE — Assessment & Plan Note (Signed)
Urinalysis clear today

## 2014-05-02 ENCOUNTER — Other Ambulatory Visit: Payer: Self-pay | Admitting: Family Medicine

## 2014-06-30 ENCOUNTER — Other Ambulatory Visit: Payer: Self-pay

## 2014-07-01 ENCOUNTER — Ambulatory Visit (INDEPENDENT_AMBULATORY_CARE_PROVIDER_SITE_OTHER): Payer: Medicare Other | Admitting: Family Medicine

## 2014-07-01 ENCOUNTER — Ambulatory Visit (HOSPITAL_BASED_OUTPATIENT_CLINIC_OR_DEPARTMENT_OTHER)
Admission: RE | Admit: 2014-07-01 | Discharge: 2014-07-01 | Disposition: A | Discharge status: Home / Self Care | Payer: Medicare Other | Source: Ambulatory Visit | Attending: Family Medicine | Admitting: Family Medicine

## 2014-07-01 ENCOUNTER — Encounter: Payer: Self-pay | Admitting: Family Medicine

## 2014-07-01 VITALS — BP 122/78 | HR 68 | Temp 98.0°F | Ht 61.0 in | Wt 105.5 lb

## 2014-07-01 DIAGNOSIS — R0789 Other chest pain: Secondary | ICD-10-CM

## 2014-07-01 DIAGNOSIS — R918 Other nonspecific abnormal finding of lung field: Secondary | ICD-10-CM | POA: Insufficient documentation

## 2014-07-01 DIAGNOSIS — R911 Solitary pulmonary nodule: Secondary | ICD-10-CM

## 2014-07-01 DIAGNOSIS — I10 Essential (primary) hypertension: Secondary | ICD-10-CM

## 2014-07-01 DIAGNOSIS — K5900 Constipation, unspecified: Secondary | ICD-10-CM | POA: Diagnosis not present

## 2014-07-01 DIAGNOSIS — F32A Depression, unspecified: Secondary | ICD-10-CM

## 2014-07-01 DIAGNOSIS — H409 Unspecified glaucoma: Secondary | ICD-10-CM

## 2014-07-01 DIAGNOSIS — T7840XS Allergy, unspecified, sequela: Secondary | ICD-10-CM

## 2014-07-01 DIAGNOSIS — E785 Hyperlipidemia, unspecified: Secondary | ICD-10-CM

## 2014-07-01 DIAGNOSIS — L989 Disorder of the skin and subcutaneous tissue, unspecified: Secondary | ICD-10-CM

## 2014-07-01 DIAGNOSIS — J449 Chronic obstructive pulmonary disease, unspecified: Secondary | ICD-10-CM | POA: Diagnosis not present

## 2014-07-01 DIAGNOSIS — F329 Major depressive disorder, single episode, unspecified: Secondary | ICD-10-CM

## 2014-07-01 NOTE — Assessment & Plan Note (Signed)
Encouraged increased hydration and fiber in diet. Daily probiotics. If bowels not moving can use MOM 2 tbls po in 4 oz of warm prune juice by mouth every 2-3 days. If no results then repeat in 4 hours with  Dulcolax suppository pr, may repeat again in 4 more hours as needed. Seek care if symptoms worsen.

## 2014-07-01 NOTE — Progress Notes (Signed)
Pre visit review using our clinic review tool, if applicable. No additional management support is needed unless otherwise documented below in the visit note. 

## 2014-07-01 NOTE — Patient Instructions (Addendum)
Rel of rec opthamology, [place]?   Hypertension Hypertension, commonly called high blood pressure, is when the force of blood pumping through your arteries is too strong. Your arteries are the blood vessels that carry blood from your heart throughout your body. A blood pressure reading consists of a higher number over a lower number, such as 110/72. The higher number (systolic) is the pressure inside your arteries when your heart pumps. The lower number (diastolic) is the pressure inside your arteries when your heart relaxes. Ideally you want your blood pressure below 120/80. Hypertension forces your heart to work harder to pump blood. Your arteries may become narrow or stiff. Having hypertension puts you at risk for heart disease, stroke, and other problems.  RISK FACTORS Some risk factors for high blood pressure are controllable. Others are not.  Risk factors you cannot control include:   Race. You may be at higher risk if you are African American.  Age. Risk increases with age.  Gender. Men are at higher risk than women before age 26 years. After age 57, women are at higher risk than men. Risk factors you can control include:  Not getting enough exercise or physical activity.  Being overweight.  Getting too much fat, sugar, calories, or salt in your diet.  Drinking too much alcohol. SIGNS AND SYMPTOMS Hypertension does not usually cause signs or symptoms. Extremely high blood pressure (hypertensive crisis) may cause headache, anxiety, shortness of breath, and nosebleed. DIAGNOSIS  To check if you have hypertension, your health care provider will measure your blood pressure while you are seated, with your arm held at the level of your heart. It should be measured at least twice using the same arm. Certain conditions can cause a difference in blood pressure between your right and left arms. A blood pressure reading that is higher than normal on one occasion does not mean that you need  treatment. If one blood pressure reading is high, ask your health care provider about having it checked again. TREATMENT  Treating high blood pressure includes making lifestyle changes and possibly taking medicine. Living a healthy lifestyle can help lower high blood pressure. You may need to change some of your habits. Lifestyle changes may include:  Following the DASH diet. This diet is high in fruits, vegetables, and whole grains. It is low in salt, red meat, and added sugars.  Getting at least 2 hours of brisk physical activity every week.  Losing weight if necessary.  Not smoking.  Limiting alcoholic beverages.  Learning ways to reduce stress. If lifestyle changes are not enough to get your blood pressure under control, your health care provider may prescribe medicine. You may need to take more than one. Work closely with your health care provider to understand the risks and benefits. HOME CARE INSTRUCTIONS  Have your blood pressure rechecked as directed by your health care provider.   Take medicines only as directed by your health care provider. Follow the directions carefully. Blood pressure medicines must be taken as prescribed. The medicine does not work as well when you skip doses. Skipping doses also puts you at risk for problems.   Do not smoke.   Monitor your blood pressure at home as directed by your health care provider. SEEK MEDICAL CARE IF:   You think you are having a reaction to medicines taken.  You have recurrent headaches or feel dizzy.  You have swelling in your ankles.  You have trouble with your vision. SEEK IMMEDIATE MEDICAL CARE IF:  You develop a severe headache or confusion.  You have unusual weakness, numbness, or feel faint.  You have severe chest or abdominal pain.  You vomit repeatedly.  You have trouble breathing. MAKE SURE YOU:   Understand these instructions.  Will watch your condition.  Will get help right away if you are  not doing well or get worse. Document Released: 03/14/2005 Document Revised: 07/29/2013 Document Reviewed: 01/04/2013 [facility] Patient Information 2015 [place], [place]. This information is not intended to replace advice given to you by your health care provider. Make sure you discuss any questions you have with your health care provider.

## 2014-07-01 NOTE — Assessment & Plan Note (Signed)
Well controlled, no changes to meds. Encouraged heart healthy diet such as the DASH diet and exercise as tolerated.  °

## 2014-07-01 NOTE — Assessment & Plan Note (Signed)
occasional episodes that only last seconds and feels like gas pains, she is encouraged to try Mylanta prn and seek care if worsens

## 2014-07-02 DIAGNOSIS — H40023 Open angle with borderline findings, high risk, bilateral: Secondary | ICD-10-CM | POA: Diagnosis not present

## 2014-07-02 DIAGNOSIS — H2513 Age-related nuclear cataract, bilateral: Secondary | ICD-10-CM | POA: Diagnosis not present

## 2014-07-03 DIAGNOSIS — D1800 Hemangioma unspecified site: Secondary | ICD-10-CM | POA: Diagnosis not present

## 2014-07-03 DIAGNOSIS — Z85828 Personal history of other malignant neoplasm of skin: Secondary | ICD-10-CM | POA: Diagnosis not present

## 2014-07-03 DIAGNOSIS — L821 Other seborrheic keratosis: Secondary | ICD-10-CM | POA: Diagnosis not present

## 2014-07-03 DIAGNOSIS — L570 Actinic keratosis: Secondary | ICD-10-CM | POA: Diagnosis not present

## 2014-07-06 ENCOUNTER — Encounter: Payer: Self-pay | Admitting: Family Medicine

## 2014-07-06 DIAGNOSIS — H409 Unspecified glaucoma: Secondary | ICD-10-CM

## 2014-07-06 HISTORY — DX: Unspecified glaucoma: H40.9

## 2014-07-06 NOTE — Assessment & Plan Note (Signed)
Encouraged daily antihistamines and nasal steroids

## 2014-07-06 NOTE — Assessment & Plan Note (Signed)
Following closely with opthamology tolerating drops

## 2014-07-06 NOTE — Assessment & Plan Note (Signed)
Doing well today, no need for medications or intervention

## 2014-07-06 NOTE — Progress Notes (Signed)
Beth [name]  [id number] Apr 23, 1923 07/06/2014      Progress Note-Follow Up  Subjective  Chief Complaint  Chief Complaint  Patient presents with  . Follow-up    BP  . Tremors    Today, "shakey feeling"     HPI  Patient is a 81 y.o. female in today for routine medical care. Patient in today accompanied by family member. No acute complaints. They note her mood is improved. No recent fevers or chills. Denies CP/palp/SOB/HA/congestion/fevers/GI or GU c/o. Taking meds as prescribed  Past Medical History  Diagnosis Date  . Hypertension   . Lung nodule     CT of chest 2007-left loewr lobar mass- evaluated by pulmonary- patient refused further work up  . Tobacco abuse   . Vertigo   . Abdominal pain, unspecified site 08/17/2012  . Other and unspecified hyperlipidemia 08/17/2012  . Loss of weight 01/19/2013  . Allergic state 06/22/2013  . Allergy   . Osteoporosis   . Syncope     fall  . Increased urinary frequency 01/02/2014  . Glaucoma 07/06/2014    Past Surgical History  Procedure Laterality Date  . Cholecystectomy    . Cesarean section    . Hernia repair    . Abdominal hysterectomy      Family History  Problem Relation Age of Onset  . Heart disease Mother   . Hypertension Mother   . Diabetes Mother   . Diabetes    . Cancer      lung- in distant releatives  . Cancer Brother     History   Social History  . Marital Status: Widowed    Spouse Name: N/A  . Number of Children: N/A  . Years of Education: N/A   Occupational History  . Not on file.   Social History Main Topics  . Smoking status: Former Smoker -- 0.25 packs/day for 60 years    Quit date: 07/05/2013  . Smokeless tobacco: Never Used  . Alcohol Use: No  . Drug Use: No  . Sexual Activity: No   Other Topics Concern  . Not on file   Social History Narrative    Current Outpatient Prescriptions on File Prior to Visit  Medication Sig Dispense Refill  . aspirin 81 MG chewable tablet Chew 1  tablet (81 mg total) by mouth daily. 30 tablet 11  . calcium-vitamin D (OSCAL WITH D 500-200) 500-200 MG-UNIT per tablet Take 1 tablet by mouth daily with breakfast.     . cetirizine (ZYRTEC) 10 MG tablet Take 10 mg by mouth every morning.    . diltiazem (TIAZAC) 240 MG 24 hr capsule TAKE 1 CAPSULE (240 MG TOTAL) BY MOUTH DAILY. 30 capsule 3  . fish oil-omega-3 fatty acids 1000 MG capsule Take 2 g by mouth daily.    . fluticasone (FLONASE) 50 MCG/ACT nasal spray Place 2 sprays into both nostrils daily.  6  . losartan (COZAAR) 25 MG tablet Take 1 tablet (25 mg total) by mouth daily. 30 tablet 2  . metoprolol succinate (TOPROL-XL) 25 MG 24 hr tablet TAKE 1 TABLET (25 MG TOTAL) BY MOUTH DAILY. 90 tablet 1  . Multiple Vitamin (MULTIVITAMIN) tablet Take 1 tablet by mouth daily.      . Saline (SODIUM CHLORIDE) 0.65 % SOLN by Nasal route. 2 sprays each nostril once daily as needed     . trimethoprim-polymyxin b (POLYTRIM) ophthalmic solution Place 2 drops into the left eye every 6 (six) hours. (Patient not taking: Reported on  07/01/2014) 10 mL 0   No current facility-administered medications on file prior to visit.    Allergies  Allergen Reactions  . Ciprofloxacin Other (See Comments)    myalgias  . Fexofenadine Other (See Comments)    unknown  . Penicillins Other (See Comments)    Review of Systems  Review of Systems  Constitutional: Negative for fever and malaise/fatigue.  HENT: Negative for congestion.   Eyes: Negative for discharge.  Respiratory: Negative for shortness of breath.   Cardiovascular: Negative for chest pain, palpitations and leg swelling.  Gastrointestinal: Negative for nausea, abdominal pain and diarrhea.  Genitourinary: Negative for dysuria.  Musculoskeletal: Negative for falls.  Skin: Negative for rash.  Neurological: Negative for loss of consciousness and headaches.  Endo/Heme/Allergies: Negative for polydipsia.  Psychiatric/Behavioral: Negative for depression and  suicidal ideas. The patient is not nervous/anxious and does not have insomnia.     Objective  BP 122/78 mmHg  Pulse 68  Temp(Src) 98 F (36.7 C) (Oral)  Ht 5\' 1"  (1.549 m)  Wt 105 lb 8 oz (47.854 kg)  BMI 19.94 kg/m2  SpO2 97%  Physical Exam  Physical Exam  Constitutional: She is oriented to person, place, and time and well-developed, well-nourished, and in no distress. No distress.  HENT:  Head: Normocephalic and atraumatic.  Eyes: Conjunctivae are normal.  Neck: Neck supple. No thyromegaly present.  Cardiovascular: Normal rate, regular rhythm and normal heart sounds.   No murmur heard. Pulmonary/Chest: Effort normal and breath sounds normal. She has no wheezes.  Abdominal: She exhibits no distension and no mass.  Musculoskeletal: She exhibits no edema.  Lymphadenopathy:    She has no cervical adenopathy.  Neurological: She is alert and oriented to person, place, and time.  Skin: Skin is warm and dry. No rash noted. She is not diaphoretic.  Psychiatric: Memory, affect and judgment normal.    Lab Results  Component Value Date   TSH 3.445 04/11/2014   Lab Results  Component Value Date   WBC 8.3 04/11/2014   HGB 13.5 04/11/2014   HCT 38.7 04/11/2014   MCV 88.0 04/11/2014   PLT 320 04/11/2014   Lab Results  Component Value Date   CREATININE 0.57 04/11/2014   BUN 10 04/11/2014   NA 140 04/11/2014   K 4.7 04/11/2014   CL 103 04/11/2014   CO2 27 04/11/2014   Lab Results  Component Value Date   ALT 13 04/11/2014   AST 21 04/11/2014   ALKPHOS 69 04/11/2014   BILITOT 0.5 04/11/2014   Lab Results  Component Value Date   CHOL 222* 01/15/2013   Lab Results  Component Value Date   HDL 72 01/15/2013   Lab Results  Component Value Date   LDLCALC 133* 01/15/2013   Lab Results  Component Value Date   TRIG 83 01/15/2013   Lab Results  Component Value Date   CHOLHDL 3.1 01/15/2013     Assessment & Plan  Essential hypertension Well controlled, no  changes to meds. Encouraged heart healthy diet such as the DASH diet and exercise as tolerated.    Constipation Encouraged increased hydration and fiber in diet. Daily probiotics. If bowels not moving can use MOM 2 tbls po in 4 oz of warm prune juice by mouth every 2-3 days. If no results then repeat in 4 hours with  Dulcolax suppository pr, may repeat again in 4 more hours as needed. Seek care if symptoms worsen.    Atypical chest pain occasional episodes that only last  seconds and feels like gas pains, she is encouraged to try Mylanta prn and seek care if worsens   Depression Doing well today, no need for medications or intervention   Allergic state Encouraged daily antihistamines and nasal steroids   Glaucoma Following closely with opthamology tolerating drops   Hyperlipidemia encouraged heart healthy diet, avoid trans fats, minimize simple carbs and saturated fats. Increase exercise as tolerated

## 2014-07-06 NOTE — Assessment & Plan Note (Signed)
encouraged heart healthy diet, avoid trans fats, minimize simple carbs and saturated fats. Increase exercise as tolerated 

## 2014-07-30 DIAGNOSIS — H43812 Vitreous degeneration, left eye: Secondary | ICD-10-CM | POA: Diagnosis not present

## 2014-07-30 DIAGNOSIS — H40013 Open angle with borderline findings, low risk, bilateral: Secondary | ICD-10-CM | POA: Diagnosis not present

## 2014-07-30 DIAGNOSIS — H35033 Hypertensive retinopathy, bilateral: Secondary | ICD-10-CM | POA: Diagnosis not present

## 2014-07-30 DIAGNOSIS — H2513 Age-related nuclear cataract, bilateral: Secondary | ICD-10-CM | POA: Diagnosis not present

## 2014-08-27 DIAGNOSIS — H40013 Open angle with borderline findings, low risk, bilateral: Secondary | ICD-10-CM | POA: Diagnosis not present

## 2014-08-28 ENCOUNTER — Encounter: Payer: Self-pay | Admitting: Family Medicine

## 2014-08-28 ENCOUNTER — Ambulatory Visit (INDEPENDENT_AMBULATORY_CARE_PROVIDER_SITE_OTHER): Payer: Medicare Other | Admitting: Family Medicine

## 2014-08-28 VITALS — BP 128/62 | HR 74 | Temp 98.5°F | Ht 62.0 in | Wt 105.5 lb

## 2014-08-28 DIAGNOSIS — E785 Hyperlipidemia, unspecified: Secondary | ICD-10-CM

## 2014-08-28 DIAGNOSIS — E782 Mixed hyperlipidemia: Secondary | ICD-10-CM | POA: Diagnosis not present

## 2014-08-28 DIAGNOSIS — I10 Essential (primary) hypertension: Secondary | ICD-10-CM

## 2014-08-28 DIAGNOSIS — R350 Frequency of micturition: Secondary | ICD-10-CM

## 2014-08-28 DIAGNOSIS — K5900 Constipation, unspecified: Secondary | ICD-10-CM

## 2014-08-28 DIAGNOSIS — M810 Age-related osteoporosis without current pathological fracture: Secondary | ICD-10-CM

## 2014-08-28 DIAGNOSIS — R0789 Other chest pain: Secondary | ICD-10-CM

## 2014-08-28 MED ORDER — MUPIROCIN 2 % EX OINT: 1.0000 "application " | TOPICAL_OINTMENT | Freq: Every day | CUTANEOUS | Status: DC | PRN

## 2014-08-28 MED ORDER — METOPROLOL SUCCINATE ER 25 MG PO TB24: ORAL_TABLET | ORAL | Status: DC

## 2014-08-28 MED ORDER — DILTIAZEM HCL ER BEADS 240 MG PO CP24: ORAL_CAPSULE | ORAL | Status: DC

## 2014-08-28 NOTE — Progress Notes (Signed)
Pre visit review using our clinic review tool, if applicable. No additional management support is needed unless otherwise documented below in the visit note. 

## 2014-08-28 NOTE — Patient Instructions (Addendum)
Consider trying to switch from Zyrtec to Allegra  Hypertension Hypertension, commonly called high blood pressure, is when the force of blood pumping through your arteries is too strong. Your arteries are the blood vessels that carry blood from your heart throughout your body. A blood pressure reading consists of a higher number over a lower number, such as 110/72. The higher number (systolic) is the pressure inside your arteries when your heart pumps. The lower number (diastolic) is the pressure inside your arteries when your heart relaxes. Ideally you want your blood pressure below 120/80. Hypertension forces your heart to work harder to pump blood. Your arteries may become narrow or stiff. Having hypertension puts you at risk for heart disease, stroke, and other problems.  RISK FACTORS Some risk factors for high blood pressure are controllable. Others are not.  Risk factors you cannot control include:   Race. You may be at higher risk if you are African American.  Age. Risk increases with age.  Gender. Men are at higher risk than women before age 33 years. After age 32, women are at higher risk than men. Risk factors you can control include:  Not getting enough exercise or physical activity.  Being overweight.  Getting too much fat, sugar, calories, or salt in your diet.  Drinking too much alcohol. SIGNS AND SYMPTOMS Hypertension does not usually cause signs or symptoms. Extremely high blood pressure (hypertensive crisis) may cause headache, anxiety, shortness of breath, and nosebleed. DIAGNOSIS  To check if you have hypertension, your health care provider will measure your blood pressure while you are seated, with your arm held at the level of your heart. It should be measured at least twice using the same arm. Certain conditions can cause a difference in blood pressure between your right and left arms. A blood pressure reading that is higher than normal on one occasion does not mean  that you need treatment. If one blood pressure reading is high, ask your health care provider about having it checked again. TREATMENT  Treating high blood pressure includes making lifestyle changes and possibly taking medicine. Living a healthy lifestyle can help lower high blood pressure. You may need to change some of your habits. Lifestyle changes may include:  Following the DASH diet. This diet is high in fruits, vegetables, and whole grains. It is low in salt, red meat, and added sugars.  Getting at least 2 hours of brisk physical activity every week.  Losing weight if necessary.  Not smoking.  Limiting alcoholic beverages.  Learning ways to reduce stress. If lifestyle changes are not enough to get your blood pressure under control, your health care provider may prescribe medicine. You may need to take more than one. Work closely with your health care provider to understand the risks and benefits. HOME CARE INSTRUCTIONS  Have your blood pressure rechecked as directed by your health care provider.   Take medicines only as directed by your health care provider. Follow the directions carefully. Blood pressure medicines must be taken as prescribed. The medicine does not work as well when you skip doses. Skipping doses also puts you at risk for problems.   Do not smoke.   Monitor your blood pressure at home as directed by your health care provider. SEEK MEDICAL CARE IF:   You think you are having a reaction to medicines taken.  You have recurrent headaches or feel dizzy.  You have swelling in your ankles.  You have trouble with your vision. [place]  IF:  You develop a severe headache or confusion.  You have unusual weakness, numbness, or feel faint.  You have severe chest or abdominal pain.  You vomit repeatedly.  You have trouble breathing. MAKE SURE YOU:   Understand these instructions.  Will watch your condition.  Will get help right  away if you are not doing well or get worse. Document Released: 03/14/2005 Document Revised: 07/29/2013 Document Reviewed: 01/04/2013 [facility] Patient Information 2015 [place], [place]. This information is not intended to replace advice given to you by your health care provider. Make sure you discuss any questions you have with your health care provider.

## 2014-08-29 LAB — COMPREHENSIVE METABOLIC PANEL
ALBUMIN: 4.2 g/dL (ref 3.5–5.2)
ALK PHOS: 64 U/L (ref 39–117)
ALT: 13 U/L (ref 0–35)
AST: 18 U/L (ref 0–37)
BILIRUBIN TOTAL: 0.4 mg/dL (ref 0.2–1.2)
BUN: 19 mg/dL (ref 6–23)
CO2: 26 mEq/L (ref 19–32)
CREATININE: 0.75 mg/dL (ref 0.40–1.20)
Calcium: 9.5 mg/dL (ref 8.4–10.5)
Chloride: 105 mEq/L (ref 96–112)
GFR: 77.39 mL/min (ref >60.00)
Glucose, Bld: 86 mg/dL (ref 70–99)
Potassium: 4.4 mEq/L (ref 3.5–5.1)
Sodium: 139 mEq/L (ref 135–145)
TOTAL PROTEIN: 7.4 g/dL (ref 6.0–8.3)

## 2014-08-29 LAB — CBC
HCT: 40 % (ref 36.0–46.0)
Hemoglobin: 13.5 g/dL (ref 12.0–15.0)
MCHC: 33.7 g/dL (ref 30.0–36.0)
MCV: 89.6 fl (ref 78.0–100.0)
Platelets: 303 10*3/uL (ref 150.0–400.0)
RBC: 4.47 Mil/uL (ref 3.87–5.11)
RDW: 14.2 % (ref 11.5–15.5)
WBC: 9.2 10*3/uL (ref 4.0–10.5)

## 2014-08-29 LAB — LIPID PANEL
Cholesterol: 195 mg/dL (ref 0–200)
HDL: 66.9 mg/dL (ref >39.00)
LDL Cholesterol: 117 mg/dL — ABNORMAL HIGH (ref 0–99)
NonHDL: 128.1
Total CHOL/HDL Ratio: 3
Triglycerides: 56 mg/dL (ref 0.0–149.0)
VLDL: 11.2 mg/dL (ref 0.0–40.0)

## 2014-08-29 LAB — TSH: TSH: 2.31 u[IU]/mL (ref 0.35–4.50)

## 2014-09-07 NOTE — Assessment & Plan Note (Signed)
Encouraged regular exercise, maintain adequate calcium and Vitamin D supplement intake.

## 2014-09-07 NOTE — Assessment & Plan Note (Signed)
Resolved occurs with reflux

## 2014-09-07 NOTE — Assessment & Plan Note (Signed)
Encouraged heart healthy diet, increase exercise, avoid trans fats, consider a krill oil cap daily 

## 2014-09-07 NOTE — Assessment & Plan Note (Signed)
Encouraged increased hydration and fiber in diet. Daily probiotics. If bowels not moving can use MOM 2 tbls po in 4 oz of warm prune juice by mouth every 2-3 days. If no results then repeat in 4 hours with  Dulcolax suppository pr, may repeat again in 4 more hours as needed. Seek care if symptoms worsen. Consider daily Miralax and/or Dulcolax if symptoms persist.  

## 2014-09-07 NOTE — Assessment & Plan Note (Signed)
Well controlled, no changes to meds. Encouraged heart healthy diet such as the DASH diet and exercise as tolerated.  °

## 2014-09-07 NOTE — Progress Notes (Signed)
Beth [name]  [id number] 07-12-1923 09/07/2014      Progress Note-Follow Up  Subjective  Chief Complaint  Chief Complaint  Patient presents with  . Follow-up    HPI  Patient is a 81 y.o. female in today for routine medical care. Patient is in today for follow-up. Overall feeling better. Notes chest pain and abdominal pain or improved as heartburn is improved. She does continue to have some nasal drip but it is tolerable. No recent illness or fevers. Denies CP/palp/SOB/HA/congestion/fevers/GI or GU c/o. Taking meds as prescribed  Past Medical History  Diagnosis Date  . Hypertension   . Lung nodule     CT of chest 2007-left loewr lobar mass- evaluated by pulmonary- patient refused further work up  . Tobacco abuse   . Vertigo   . Abdominal pain, unspecified site 08/17/2012  . Other and unspecified hyperlipidemia 08/17/2012  . Loss of weight 01/19/2013  . Allergic state 06/22/2013  . Allergy   . Osteoporosis   . Syncope     fall  . Increased urinary frequency 01/02/2014  . Glaucoma 07/06/2014    Past Surgical History  Procedure Laterality Date  . Cholecystectomy    . Cesarean section    . Hernia repair    . Abdominal hysterectomy      Family History  Problem Relation Age of Onset  . Heart disease Mother   . Hypertension Mother   . Diabetes Mother   . Diabetes    . Cancer      lung- in distant releatives  . Cancer Brother     History   Social History  . Marital Status: Widowed    Spouse Name: N/A  . Number of Children: N/A  . Years of Education: N/A   Occupational History  . Not on file.   Social History Main Topics  . Smoking status: Former Smoker -- 0.25 packs/day for 60 years    Quit date: 07/05/2013  . Smokeless tobacco: Never Used  . Alcohol Use: No  . Drug Use: No  . Sexual Activity: No   Other Topics Concern  . Not on file   Social History Narrative    Current Outpatient Prescriptions on File Prior to Visit  Medication Sig Dispense  Refill  . aspirin 81 MG chewable tablet Chew 1 tablet (81 mg total) by mouth daily. 30 tablet 11  . calcium-vitamin D (OSCAL WITH D 500-200) 500-200 MG-UNIT per tablet Take 1 tablet by mouth daily with breakfast.     . cetirizine (ZYRTEC) 10 MG tablet Take 10 mg by mouth every morning.    . fish oil-omega-3 fatty acids 1000 MG capsule Take 2 g by mouth daily.    . fluticasone (FLONASE) 50 MCG/ACT nasal spray Place 2 sprays into both nostrils daily.  6  . Multiple Vitamin (MULTIVITAMIN) tablet Take 1 tablet by mouth daily.      . Saline (SODIUM CHLORIDE) 0.65 % SOLN by Nasal route. 2 sprays each nostril once daily as needed     . trimethoprim-polymyxin b (POLYTRIM) ophthalmic solution Place 2 drops into the left eye every 6 (six) hours. 10 mL 0  . losartan (COZAAR) 25 MG tablet Take 1 tablet (25 mg total) by mouth daily. (Patient not taking: Reported on 08/28/2014) 30 tablet 2   No current facility-administered medications on file prior to visit.    Allergies  Allergen Reactions  . Ciprofloxacin Other (See Comments)    myalgias  . Fexofenadine Other (See Comments)  unknown  . Penicillins Other (See Comments)    Review of Systems  Review of Systems  Constitutional: Negative for fever and malaise/fatigue.  HENT: Negative for congestion.   Eyes: Negative for discharge.  Respiratory: Negative for shortness of breath.   Cardiovascular: Negative for chest pain, palpitations and leg swelling.  Gastrointestinal: Negative for nausea, abdominal pain and diarrhea.  Genitourinary: Negative for dysuria.  Musculoskeletal: Negative for falls.  Skin: Negative for rash.  Neurological: Negative for loss of consciousness and headaches.  Endo/Heme/Allergies: Negative for polydipsia.  Psychiatric/Behavioral: Negative for depression and suicidal ideas. The patient is not nervous/anxious and does not have insomnia.     Objective  BP 128/62 mmHg  Pulse 74  Temp(Src) 98.5 F (36.9 C) (Oral)  Ht  5\' 2"  (1.575 m)  Wt 105 lb 8 oz (47.854 kg)  BMI 19.29 kg/m2  SpO2 93%  Physical Exam  Physical Exam  Constitutional: She is oriented to person, place, and time and well-developed, well-nourished, and in no distress. No distress.  HENT:  Head: Normocephalic and atraumatic.  Eyes: Conjunctivae are normal.  Neck: Neck supple. No thyromegaly present.  Cardiovascular: Normal rate, regular rhythm and normal heart sounds.   No murmur heard. Pulmonary/Chest: Effort normal and breath sounds normal. She has no wheezes.  Abdominal: She exhibits no distension and no mass.  Musculoskeletal: She exhibits no edema.  Lymphadenopathy:    She has no cervical adenopathy.  Neurological: She is alert and oriented to person, place, and time.  Skin: Skin is warm and dry. No rash noted. She is not diaphoretic.  Psychiatric: Memory, affect and judgment normal.    Lab Results  Component Value Date   TSH 2.31 08/28/2014   Lab Results  Component Value Date   WBC 9.2 08/28/2014   HGB 13.5 08/28/2014   HCT 40.0 08/28/2014   MCV 89.6 08/28/2014   PLT 303.0 08/28/2014   Lab Results  Component Value Date   CREATININE 0.75 08/28/2014   BUN 19 08/28/2014   NA 139 08/28/2014   K 4.4 08/28/2014   CL 105 08/28/2014   CO2 26 08/28/2014   Lab Results  Component Value Date   ALT 13 08/28/2014   AST 18 08/28/2014   ALKPHOS 64 08/28/2014   BILITOT 0.4 08/28/2014   Lab Results  Component Value Date   CHOL 195 08/28/2014   Lab Results  Component Value Date   HDL 66.90 08/28/2014   Lab Results  Component Value Date   LDLCALC 117* 08/28/2014   Lab Results  Component Value Date   TRIG 56.0 08/28/2014   Lab Results  Component Value Date   CHOLHDL 3 08/28/2014     Assessment & Plan  Essential hypertension Well controlled, no changes to meds. Encouraged heart healthy diet such as the DASH diet and exercise as tolerated.   Constipation Encouraged increased hydration and fiber in diet.  Daily probiotics. If bowels not moving can use MOM 2 tbls po in 4 oz of warm prune juice by mouth every 2-3 days. If no results then repeat in 4 hours with  Dulcolax suppository pr, may repeat again in 4 more hours as needed. Seek care if symptoms worsen. Consider daily Miralax and/or Dulcolax if symptoms persist.   Hyperlipidemia Encouraged heart healthy diet, increase exercise, avoid trans fats, consider a krill oil cap daily  Osteoporosis Encouraged regular exercise, maintain adequate calcium and Vitamin D supplement intake.   Atypical chest pain Resolved occurs with reflux

## 2014-09-25 ENCOUNTER — Encounter (HOSPITAL_COMMUNITY): Payer: Self-pay | Admitting: *Deleted

## 2014-09-25 ENCOUNTER — Emergency Department (HOSPITAL_COMMUNITY): Payer: Medicare Other

## 2014-09-25 ENCOUNTER — Inpatient Hospital Stay (HOSPITAL_COMMUNITY)
Admission: EM | Admit: 2014-09-25 | Discharge: 2014-09-30 | DRG: 243 | Disposition: A | Discharge status: Skilled Nursing Facility | Payer: Medicare Other | Attending: Internal Medicine | Admitting: Internal Medicine

## 2014-09-25 DIAGNOSIS — L603 Nail dystrophy: Secondary | ICD-10-CM | POA: Diagnosis present

## 2014-09-25 DIAGNOSIS — I272 Other secondary pulmonary hypertension: Secondary | ICD-10-CM | POA: Diagnosis present

## 2014-09-25 DIAGNOSIS — I5022 Chronic systolic (congestive) heart failure: Secondary | ICD-10-CM

## 2014-09-25 DIAGNOSIS — I4891 Unspecified atrial fibrillation: Secondary | ICD-10-CM | POA: Diagnosis present

## 2014-09-25 DIAGNOSIS — H9190 Unspecified hearing loss, unspecified ear: Secondary | ICD-10-CM | POA: Diagnosis present

## 2014-09-25 DIAGNOSIS — Z881 Allergy status to other antibiotic agents status: Secondary | ICD-10-CM | POA: Diagnosis not present

## 2014-09-25 DIAGNOSIS — Z5189 Encounter for other specified aftercare: Secondary | ICD-10-CM | POA: Diagnosis not present

## 2014-09-25 DIAGNOSIS — I5042 Chronic combined systolic (congestive) and diastolic (congestive) heart failure: Secondary | ICD-10-CM | POA: Diagnosis present

## 2014-09-25 DIAGNOSIS — R0600 Dyspnea, unspecified: Secondary | ICD-10-CM | POA: Diagnosis not present

## 2014-09-25 DIAGNOSIS — E785 Hyperlipidemia, unspecified: Secondary | ICD-10-CM | POA: Diagnosis present

## 2014-09-25 DIAGNOSIS — I480 Paroxysmal atrial fibrillation: Secondary | ICD-10-CM | POA: Diagnosis present

## 2014-09-25 DIAGNOSIS — I351 Nonrheumatic aortic (valve) insufficiency: Secondary | ICD-10-CM | POA: Diagnosis present

## 2014-09-25 DIAGNOSIS — J449 Chronic obstructive pulmonary disease, unspecified: Secondary | ICD-10-CM | POA: Diagnosis present

## 2014-09-25 DIAGNOSIS — R911 Solitary pulmonary nodule: Secondary | ICD-10-CM | POA: Diagnosis present

## 2014-09-25 DIAGNOSIS — Z79899 Other long term (current) drug therapy: Secondary | ICD-10-CM | POA: Diagnosis not present

## 2014-09-25 DIAGNOSIS — I279 Pulmonary heart disease, unspecified: Secondary | ICD-10-CM | POA: Diagnosis not present

## 2014-09-25 DIAGNOSIS — Z833 Family history of diabetes mellitus: Secondary | ICD-10-CM | POA: Diagnosis not present

## 2014-09-25 DIAGNOSIS — R296 Repeated falls: Secondary | ICD-10-CM | POA: Diagnosis present

## 2014-09-25 DIAGNOSIS — E876 Hypokalemia: Secondary | ICD-10-CM | POA: Diagnosis present

## 2014-09-25 DIAGNOSIS — Z950 Presence of cardiac pacemaker: Secondary | ICD-10-CM | POA: Diagnosis not present

## 2014-09-25 DIAGNOSIS — I10 Essential (primary) hypertension: Secondary | ICD-10-CM | POA: Diagnosis not present

## 2014-09-25 DIAGNOSIS — Z880 Allergy status to penicillin: Secondary | ICD-10-CM | POA: Diagnosis not present

## 2014-09-25 DIAGNOSIS — Z7982 Long term (current) use of aspirin: Secondary | ICD-10-CM | POA: Diagnosis not present

## 2014-09-25 DIAGNOSIS — E559 Vitamin D deficiency, unspecified: Secondary | ICD-10-CM | POA: Diagnosis present

## 2014-09-25 DIAGNOSIS — S069X9A Unspecified intracranial injury with loss of consciousness of unspecified duration, initial encounter: Secondary | ICD-10-CM | POA: Diagnosis not present

## 2014-09-25 DIAGNOSIS — I495 Sick sinus syndrome: Secondary | ICD-10-CM | POA: Diagnosis not present

## 2014-09-25 DIAGNOSIS — R2681 Unsteadiness on feet: Secondary | ICD-10-CM | POA: Diagnosis not present

## 2014-09-25 DIAGNOSIS — F17210 Nicotine dependence, cigarettes, uncomplicated: Secondary | ICD-10-CM | POA: Diagnosis present

## 2014-09-25 DIAGNOSIS — I459 Conduction disorder, unspecified: Secondary | ICD-10-CM | POA: Diagnosis not present

## 2014-09-25 DIAGNOSIS — M5136 Other intervertebral disc degeneration, lumbar region: Secondary | ICD-10-CM | POA: Diagnosis not present

## 2014-09-25 DIAGNOSIS — Z66 Do not resuscitate: Secondary | ICD-10-CM | POA: Diagnosis present

## 2014-09-25 DIAGNOSIS — Z8249 Family history of ischemic heart disease and other diseases of the circulatory system: Secondary | ICD-10-CM

## 2014-09-25 DIAGNOSIS — M545 Low back pain, unspecified: Secondary | ICD-10-CM

## 2014-09-25 DIAGNOSIS — M810 Age-related osteoporosis without current pathological fracture: Secondary | ICD-10-CM | POA: Diagnosis present

## 2014-09-25 DIAGNOSIS — M549 Dorsalgia, unspecified: Secondary | ICD-10-CM | POA: Insufficient documentation

## 2014-09-25 DIAGNOSIS — Z9181 History of falling: Secondary | ICD-10-CM | POA: Diagnosis not present

## 2014-09-25 DIAGNOSIS — M6281 Muscle weakness (generalized): Secondary | ICD-10-CM | POA: Diagnosis not present

## 2014-09-25 DIAGNOSIS — R079 Chest pain, unspecified: Secondary | ICD-10-CM | POA: Diagnosis not present

## 2014-09-25 DIAGNOSIS — R001 Bradycardia, unspecified: Secondary | ICD-10-CM | POA: Diagnosis not present

## 2014-09-25 DIAGNOSIS — S299XXA Unspecified injury of thorax, initial encounter: Secondary | ICD-10-CM | POA: Diagnosis not present

## 2014-09-25 DIAGNOSIS — T148 Other injury of unspecified body region: Secondary | ICD-10-CM | POA: Diagnosis not present

## 2014-09-25 DIAGNOSIS — R55 Syncope and collapse: Secondary | ICD-10-CM | POA: Diagnosis present

## 2014-09-25 LAB — CBC WITH DIFFERENTIAL/PLATELET
BASOS ABS: 0 10*3/uL (ref 0.0–0.1)
BASOS PCT: 0 % (ref 0–1)
EOS PCT: 0 % (ref 0–5)
Eosinophils Absolute: 0 10*3/uL (ref 0.0–0.7)
HEMATOCRIT: 40.4 % (ref 36.0–46.0)
HEMOGLOBIN: 14.1 g/dL (ref 12.0–15.0)
LYMPHS ABS: 0.7 10*3/uL (ref 0.7–4.0)
Lymphocytes Relative: 7 % — ABNORMAL LOW (ref 12–46)
MCH: 30.3 pg (ref 26.0–34.0)
MCHC: 34.9 g/dL (ref 30.0–36.0)
MCV: 86.7 fL (ref 78.0–100.0)
MONO ABS: 0.6 10*3/uL (ref 0.1–1.0)
Monocytes Relative: 6 % (ref 3–12)
NEUTROS ABS: 8.9 10*3/uL — AB (ref 1.7–7.7)
Neutrophils Relative %: 87 % — ABNORMAL HIGH (ref 43–77)
Platelets: 259 10*3/uL (ref 150–400)
RBC: 4.66 MIL/uL (ref 3.87–5.11)
RDW: 13.3 % (ref 11.5–15.5)
WBC: 10.2 10*3/uL (ref 4.0–10.5)

## 2014-09-25 LAB — COMPREHENSIVE METABOLIC PANEL
ALT: 19 U/L (ref 14–54)
ANION GAP: 8 (ref 5–15)
AST: 27 U/L (ref 15–41)
Albumin: 3.6 g/dL (ref 3.5–5.0)
Alkaline Phosphatase: 63 U/L (ref 38–126)
BUN: 8 mg/dL (ref 6–20)
CO2: 23 mmol/L (ref 22–32)
Calcium: 9 mg/dL (ref 8.9–10.3)
Chloride: 111 mmol/L (ref 101–111)
Creatinine, Ser: 0.71 mg/dL (ref 0.44–1.00)
GFR calc Af Amer: >60 mL/min (ref >60)
GFR calc non Af Amer: >60 mL/min (ref >60)
Glucose, Bld: 130 mg/dL — ABNORMAL HIGH (ref 65–99)
POTASSIUM: 3.7 mmol/L (ref 3.5–5.1)
Sodium: 142 mmol/L (ref 135–145)
TOTAL PROTEIN: 6.8 g/dL (ref 6.5–8.1)
Total Bilirubin: 0.6 mg/dL (ref 0.3–1.2)

## 2014-09-25 LAB — URINALYSIS, ROUTINE W REFLEX MICROSCOPIC
Bilirubin Urine: NEGATIVE
Glucose, UA: NEGATIVE mg/dL
Ketones, ur: NEGATIVE mg/dL
Leukocytes, UA: NEGATIVE
Nitrite: NEGATIVE
PROTEIN: NEGATIVE mg/dL
Specific Gravity, Urine: 1.005 (ref 1.005–1.030)
Urobilinogen, UA: 0.2 mg/dL (ref 0.0–1.0)
pH: 7 (ref 5.0–8.0)

## 2014-09-25 LAB — PROTIME-INR
INR: 1.06 (ref 0.00–1.49)
Prothrombin Time: 14 seconds (ref 11.6–15.2)

## 2014-09-25 LAB — URINE MICROSCOPIC-ADD ON

## 2014-09-25 LAB — CK: Total CK: 85 U/L (ref 38–234)

## 2014-09-25 LAB — I-STAT TROPONIN, ED: Troponin i, poc: 0.01 ng/mL (ref 0.00–0.08)

## 2014-09-25 LAB — MRSA PCR SCREENING: MRSA by PCR: NEGATIVE

## 2014-09-25 LAB — TROPONIN I: Troponin I: <0.03 ng/mL (ref <0.031)

## 2014-09-25 MED ORDER — ASPIRIN 81 MG PO CHEW
81.0000 mg | CHEWABLE_TABLET | Freq: Every day | ORAL | Status: DC
  Administered 2014-09-26 – 2014-09-30 (×5): 81 mg via ORAL
  Filled 2014-09-25 (×5): qty 1

## 2014-09-25 MED ORDER — ENOXAPARIN SODIUM 40 MG/0.4ML ~~LOC~~ SOLN
40.0000 mg | SUBCUTANEOUS | Status: DC
  Administered 2014-09-25: 40 mg via SUBCUTANEOUS
  Filled 2014-09-25: qty 0.4

## 2014-09-25 MED ORDER — DILTIAZEM HCL 100 MG IV SOLR: 5.0000 mg/h | INTRAVENOUS | Status: DC

## 2014-09-25 MED ORDER — ONDANSETRON HCL 4 MG PO TABS: 4.0000 mg | ORAL_TABLET | Freq: Four times a day (QID) | ORAL | Status: DC | PRN

## 2014-09-25 MED ORDER — SODIUM CHLORIDE 0.9 % IV SOLN: INTRAVENOUS | Status: DC

## 2014-09-25 MED ORDER — OXYCODONE HCL 5 MG PO TABS
5.0000 mg | ORAL_TABLET | ORAL | Status: DC | PRN
  Administered 2014-09-27: 5 mg via ORAL
  Filled 2014-09-25: qty 1

## 2014-09-25 MED ORDER — HEPARIN BOLUS VIA INFUSION
2500.0000 [IU] | Freq: Once | INTRAVENOUS | Status: DC
  Filled 2014-09-25: qty 2500

## 2014-09-25 MED ORDER — SODIUM CHLORIDE 0.9 % IV BOLUS (SEPSIS)
500.0000 mL | Freq: Once | INTRAVENOUS | Status: AC
  Administered 2014-09-25: 500 mL via INTRAVENOUS

## 2014-09-25 MED ORDER — MORPHINE SULFATE 2 MG/ML IJ SOLN: 1.0000 mg | INTRAMUSCULAR | Status: DC | PRN

## 2014-09-25 MED ORDER — LORATADINE 10 MG PO TABS
10.0000 mg | ORAL_TABLET | Freq: Every day | ORAL | Status: DC
  Administered 2014-09-27 – 2014-09-30 (×4): 10 mg via ORAL
  Filled 2014-09-25 (×5): qty 1

## 2014-09-25 MED ORDER — KETOROLAC TROMETHAMINE 15 MG/ML IJ SOLN
15.0000 mg | Freq: Once | INTRAMUSCULAR | Status: DC
  Filled 2014-09-25: qty 1

## 2014-09-25 MED ORDER — DILTIAZEM HCL ER BEADS 240 MG PO CP24: 240.0000 mg | ORAL_CAPSULE | Freq: Every day | ORAL | Status: DC

## 2014-09-25 MED ORDER — ACETAMINOPHEN 325 MG PO TABS: 650.0000 mg | ORAL_TABLET | ORAL | Status: DC | PRN

## 2014-09-25 MED ORDER — METOPROLOL SUCCINATE ER 25 MG PO TB24
25.0000 mg | ORAL_TABLET | Freq: Every day | ORAL | Status: DC
  Administered 2014-09-26 – 2014-09-30 (×4): 25 mg via ORAL
  Filled 2014-09-25 (×4): qty 1

## 2014-09-25 MED ORDER — ALUM & MAG HYDROXIDE-SIMETH 200-200-20 MG/5ML PO SUSP: 30.0000 mL | Freq: Four times a day (QID) | ORAL | Status: DC | PRN

## 2014-09-25 MED ORDER — SODIUM CHLORIDE 0.9 % IJ SOLN
3.0000 mL | Freq: Two times a day (BID) | INTRAMUSCULAR | Status: DC
  Administered 2014-09-25 – 2014-09-30 (×8): 3 mL via INTRAVENOUS

## 2014-09-25 MED ORDER — HEPARIN (PORCINE) IN NACL 100-0.45 UNIT/ML-% IJ SOLN
700.0000 [IU]/h | INTRAMUSCULAR | Status: DC
  Filled 2014-09-25: qty 250

## 2014-09-25 MED ORDER — OMEGA-3-ACID ETHYL ESTERS 1 G PO CAPS
2.0000 g | ORAL_CAPSULE | Freq: Every day | ORAL | Status: DC
  Administered 2014-09-26 – 2014-09-29 (×4): 2 g via ORAL
  Filled 2014-09-25 (×5): qty 2

## 2014-09-25 MED ORDER — DOCUSATE SODIUM 100 MG PO CAPS
100.0000 mg | ORAL_CAPSULE | Freq: Two times a day (BID) | ORAL | Status: DC
  Administered 2014-09-26 – 2014-09-30 (×8): 100 mg via ORAL
  Filled 2014-09-25 (×9): qty 1

## 2014-09-25 MED ORDER — DILTIAZEM HCL ER BEADS 240 MG PO CP24
240.0000 mg | ORAL_CAPSULE | Freq: Every day | ORAL | Status: DC
  Administered 2014-09-26: 240 mg via ORAL
  Filled 2014-09-25 (×2): qty 1

## 2014-09-25 MED ORDER — ONDANSETRON HCL 4 MG/2ML IJ SOLN: 4.0000 mg | Freq: Four times a day (QID) | INTRAMUSCULAR | Status: DC | PRN

## 2014-09-25 NOTE — ED Notes (Signed)
Pt in from home via [facility], per report pt had syncopal episode falling onto her carpet floor today @ 8am, unknown down time, pt crawled to her bed & called her daughter, EMS was called, pt noted to be in A fib with HR 200, pt takes Cardiazem, unknown if she hit her head, presents to ED in [place], c/o R lower back pain, moves all exretmities, A&O x4, pt rcvd x 2 10 mg Cardiazem boluses & then was started on 5 mg Cardiazem, pts rate 98 upon arrival to ED

## 2014-09-25 NOTE — Progress Notes (Signed)
ANTICOAGULATION CONSULT NOTE - Initial Consult  Pharmacy Consult for Heparin Indication: atrial fibrillation  Allergies  Allergen Reactions  . Ciprofloxacin Other (See Comments)    myalgias  . Fexofenadine Other (See Comments)    unknown  . Penicillins Other (See Comments)    Patient Measurements: Height: 5\' 1"  (154.9 cm) Weight: 108 lb (48.988 kg) IBW/kg (Calculated) : 47.8 Heparin Dosing Weight: 49 kg  Vital Signs: Temp: 98.3 F (36.8 C) (06/30 1351) Temp Source: Oral (06/30 1351) BP: 129/58 mmHg (06/30 1600) Pulse Rate: 67 (06/30 1600)  Labs:  Recent Labs  09/25/14 1440  HGB 14.1  HCT 40.4  PLT 259  LABPROT 14.0  INR 1.06  CREATININE 0.71  CKTOTAL 85    Estimated Creatinine Clearance: 36.7 mL/min (by C-G formula based on Cr of 0.71).   Medical History: Past Medical History  Diagnosis Date  . Hypertension   . Lung nodule     CT of chest 2007-left loewr lobar mass- evaluated by pulmonary- patient refused further work up  . Tobacco abuse   . Vertigo   . Abdominal pain, unspecified site 08/17/2012  . Other and unspecified hyperlipidemia 08/17/2012  . Loss of weight 01/19/2013  . Allergic state 06/22/2013  . Allergy   . Osteoporosis   . Syncope     fall  . Increased urinary frequency 01/02/2014  . Glaucoma 07/06/2014    Medications:   (Not in a hospital admission) Scheduled:  . ketorolac  15 mg Intravenous Once   Infusions:  . sodium chloride    . diltiazem (CARDIZEM) infusion      Assessment: 81yo female with history of HTN presents after mechanical fall and new onset Afib. Pharmacy is consulted to dose heparin for atrial fibrillation. CBC is wnl, sCr 0.7, Trop neg x1.   Goal of Therapy:  Heparin level 0.3-0.7 units/ml Monitor platelets by anticoagulation protocol: Yes   Plan:  Give 2500 units bolus x 1 Start heparin infusion at 700 units/hr Check anti-Xa level in 8 hours and daily while on heparin Continue to monitor H&H and  platelets  Andrey Cota. Diona Foley, PharmD Clinical Pharmacist Pager [phone] 09/25/2014,4:45 PM

## 2014-09-25 NOTE — ED Provider Notes (Signed)
CSN: [id number]     Arrival date & time 09/25/14  1335 History   First MD Initiated Contact with Patient 09/25/14 1336     Chief Complaint  Patient presents with  . Fall     (Consider location/radiation/quality/duration/timing/severity/associated sxs/prior Treatment) Patient is a 81 y.o. female presenting with fall. The history is provided by the patient.  Fall This is a new problem. Pertinent negatives include no chest pain, no abdominal pain, no headaches and no shortness of breath.   patient had syncope and a fall this morning. States she was making some coffee when she became very sweaty and passed out. States she went down onto her rear end and was out for an unknown amount of time. She then could not get up to her back pain. EMS found patient to be in A. fib with RVR and rate slowed down with Cardizem. No headache. No confusion. Complaining only of pain in her back. No loss of bladder or bowel control. No numbness or weakness, but pain limits movement. She is not on anticoagulation. She does have a possible history of atrial fibrillation.  Past Medical History  Diagnosis Date  . Hypertension   . Lung nodule     CT of chest 2007-left loewr lobar mass- evaluated by pulmonary- patient refused further work up  . Tobacco abuse   . Vertigo   . Abdominal pain, unspecified site 08/17/2012  . Other and unspecified hyperlipidemia 08/17/2012  . Loss of weight 01/19/2013  . Allergic state 06/22/2013  . Allergy   . Osteoporosis   . Syncope     fall  . Increased urinary frequency 01/02/2014  . Glaucoma 07/06/2014   Past Surgical History  Procedure Laterality Date  . Cholecystectomy    . Cesarean section    . Hernia repair    . Abdominal hysterectomy     Family History  Problem Relation Age of Onset  . Heart disease Mother   . Hypertension Mother   . Diabetes Mother   . Diabetes    . Cancer      lung- in distant releatives  . Cancer Brother    History  Substance Use Topics  .  Smoking status: Current Every Day Smoker -- 0.10 packs/day for 60 years    Types: Cigarettes  . Smokeless tobacco: Never Used  . Alcohol Use: No   OB History    No data available     Review of Systems  Constitutional: Negative for activity change and appetite change.  Eyes: Negative for pain.  Respiratory: Negative for chest tightness and shortness of breath.   Cardiovascular: Negative for chest pain and leg swelling.  Gastrointestinal: Negative for nausea, vomiting, abdominal pain and diarrhea.  Genitourinary: Negative for flank pain.  Musculoskeletal: Positive for back pain. Negative for neck stiffness.  Skin: Negative for rash.  Neurological: Positive for syncope. Negative for weakness, numbness and headaches.  Psychiatric/Behavioral: Negative for behavioral problems.      Allergies  Ciprofloxacin; Fexofenadine; and Penicillins  Home Medications   Prior to Admission medications   Medication Sig Start Date End Date Taking? Authorizing Provider  aspirin 81 MG chewable tablet Chew 1 tablet (81 mg total) by mouth daily. 07/02/13  Yes Orson Eva, MD  calcium-vitamin D (OSCAL WITH D 500-200) 500-200 MG-UNIT per tablet Take 1 tablet by mouth daily at 12 noon.    Yes Historical Provider, MD  cetirizine (ZYRTEC) 10 MG tablet Take 10 mg by mouth every morning.   Yes Historical Provider, MD  diltiazem (TIAZAC) 240 MG 24 hr capsule TAKE 1 CAPSULE (240 MG TOTAL) BY MOUTH DAILY. 08/28/14  Yes Mosie Lukes, MD  fish oil-omega-3 fatty acids 1000 MG capsule Take 2 g by mouth daily at 12 noon.    Yes Historical Provider, MD  metoprolol succinate (TOPROL-XL) 25 MG 24 hr tablet TAKE 1 TABLET (25 MG TOTAL) BY MOUTH DAILY. 08/28/14  Yes Mosie Lukes, MD  Multiple Vitamin (MULTIVITAMIN) tablet Take 1 tablet by mouth daily at 12 noon.    Yes Historical Provider, MD  Saline (SODIUM CHLORIDE) 0.65 % SOLN Place 1 spray into the nose as needed for congestion. 2 sprays each nostril once daily as needed    Yes Historical Provider, MD  losartan (COZAAR) 25 MG tablet Take 1 tablet (25 mg total) by mouth daily. Patient not taking: Reported on 08/28/2014 01/02/14   Mosie Lukes, MD  mupirocin ointment (BACTROBAN) 2 % Place 1 application into the nose daily as needed. Apply to nares via clean qtip prn Patient not taking: Reported on 09/25/2014 08/28/14   Mosie Lukes, MD  trimethoprim-polymyxin b (POLYTRIM) ophthalmic solution Place 2 drops into the left eye every 6 (six) hours. Patient not taking: Reported on 09/25/2014 04/11/14   Mosie Lukes, MD   BP 126/56 mmHg  Pulse 69  Temp(Src) 98.3 F (36.8 C) (Oral)  Resp 18  Ht 5\' 1"  (1.549 m)  Wt 108 lb (48.988 kg)  BMI 20.42 kg/m2  SpO2 94% Physical Exam  Constitutional: She appears well-developed.  HENT:  Head: Normocephalic and atraumatic.  Eyes: EOM are normal.  Neck: Neck supple.  Cardiovascular: Normal rate.   Pulmonary/Chest: Effort normal.  Abdominal: There is tenderness.  Slight tenderness to right upper quadrant. No ecchymosis. No rebound or guarding.  Musculoskeletal: She exhibits tenderness.  No tenderness over extremities. Some tenderness with some ecchymosis over upper  lumbar spine  Neurological: She is alert.  Skin: Skin is warm. No erythema.    ED Course  Procedures (including critical care time) Labs Review Labs Reviewed  COMPREHENSIVE METABOLIC PANEL - Abnormal; Notable for the following:    Glucose, Bld 130 (*)    All other components within normal limits  CBC WITH DIFFERENTIAL/PLATELET - Abnormal; Notable for the following:    Neutrophils Relative % 87 (*)    Neutro Abs 8.9 (*)    Lymphocytes Relative 7 (*)    All other components within normal limits  URINALYSIS, ROUTINE W REFLEX MICROSCOPIC (NOT AT [facility]) - Abnormal; Notable for the following:    Hgb urine dipstick SMALL (*)    All other components within normal limits  PROTIME-INR  CK  URINE MICROSCOPIC-ADD ON  Randolm Idol, ED    Imaging Review Dg  Chest 2 View  09/25/2014   CLINICAL DATA:  81 year old female with fall and chest pain.  EXAM: CHEST  2 VIEW  COMPARISON:  Chest radiograph dated 07/01/2014 and chest CT dated 08/12/2005 .  FINDINGS: Two views of the chest demonstrate emphysematous changes of the lungs. There is no focal consolidation. No pleural effusion or pneumothorax. Multiple bilateral pulmonary nodules similar to the prior study most compatible with metastatic disease. Left upper lobe pleural-based density similar to prior study likely corresponding to the pleural based mass seen on the CT dated 08/12/2005. The cardiomediastinal silhouette is within normal limits. There is osteopenia. Degenerative changes of the spine.  IMPRESSION: No definite acute/traumatic intrathoracic pathology.  Emphysema.  No focal consolidation or pneumothorax.  Multiple bilateral  pulmonary nodules grossly similar to the prior study and concerning for metastatic disease.   Electronically Signed   By: Anner Crete M.D.   On: 09/25/2014 15:09   Dg Lumbar Spine Complete  09/25/2014   CLINICAL DATA:  81 year old female with a history of fall and lower back pain  EXAM: LUMBAR SPINE - COMPLETE 4+ VIEW  COMPARISON:  06/28/2013, 08/07/2009  FINDINGS: Apex right scoliotic curvature of the lumbar spine, centered at L3. There is associated multilevel degenerative disc disease and facet disease.  Diffuse osteopenia.  Relative maintenance of the anatomic alignment on the lateral view. There is mild retrolisthesis of L1 on L2 and L2 on L3, as well as mild anterolisthesis of L4 on L5. This may be projectional given the scoliotic curvature.  Vertebral body heights maintained, similar to the comparison.  No fracture line identified.  Dense atherosclerotic calcifications of the abdomen and pelvis.  IMPRESSION: No radiographic evidence of acute fracture or malalignment of the lumbar spine, however, osteopenia limits evaluation for nondisplaced fractures, and if there is ongoing  concern for acute injury, MRI may be considered as a more sensitive test.  Scoliotic curvature with associated degenerative changes.  Atherosclerosis.  Signed,  Dulcy Fanny. Earleen Newport, DO  [facility]  [facility]   Electronically Signed   By: Corrie Mckusick D.O.   On: 09/25/2014 15:05   Ct Head Wo Contrast  09/25/2014   CLINICAL DATA:  Syncopal episode. Unknown period of loss of consciousness. Fell earlier today. Patient does not complain of headache.  EXAM: CT HEAD WITHOUT CONTRAST  TECHNIQUE: Contiguous axial images were obtained from the base of the skull through the vertex without intravenous contrast.  COMPARISON:  None.  FINDINGS: Global atrophy with small vessel disease. No acute stroke or hemorrhage. No mass lesion or extra-axial fluid. Hydrocephalus ex vacuo.  There is no visible skull fracture or worrisome osseous lesion. No significant scalp hematoma.  Moderately advanced vascular calcification. Paranasal sinuses show no significant fluid accumulation.  IMPRESSION: Chronic changes as described.  No acute intracranial findings.   Electronically Signed   By: Staci Righter M.D.   On: 09/25/2014 15:27     EKG Interpretation   Date/Time:  Thursday September 25 2014 13:44:34 EDT Ventricular Rate:  87 PR Interval:    QRS Duration: 127 QT Interval:  410 QTC Calculation: 493 R Axis:   -83 Text Interpretation:  Atrial fibrillation IVCD, consider atypical RBBB LVH  with secondary repolarization abnormality Anterior infarct, old Confirmed  by Rolfe Hartsell  MD, Ovid Curd [phone]) on 09/25/2014 2:12:33 PM      MDM   Final diagnoses:  Atrial fibrillation with RVR  Syncope, unspecified syncope type  Midline low back pain without sciatica    Patient with syncope and fall. Atrial fibrillation appears to be new onset. Rate is now controlled. Does have back tenderness but negative plain films. May need further evaluation. Will admit to internal medicine    Davonna Belling, MD 09/25/14 [phone]

## 2014-09-25 NOTE — Consult Note (Signed)
Reason for Consult: syncope   Referring Physician: [facility]    PCP:  Penni Homans, MD  Primary Cardiologist:Dr. Nahser  Beth [name] is an 81 y.o. female.    Chief Complaint: syncope   HPI:   81 year old female patient with hx of  HTN, aortic insufficiency, AF, severe protein calorie malnutrition, and tobacco abuse. Has CAD with remote LHC with luminal irregularities. Negative Myoview in June of 2013.  Admitted today for syncope and found to be in a fib rate of 87- on last EKG 09/04/14 she was in SR.  She has had similar presentation in 06/2013 with a fib with RVR and syncope.  CHADSVASc= 5 which was discussed with daughter in 35 but no anticoagulation due to risk of falls.    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  She eventually got back to bed and waited for her daughter to contact her.  No chest pain, some SOB with episode, was not aware of heart racing.    Last echo 06/2013  Left ventricle: The cavity size was normal. Wall thickness was increased in a pattern of mild LVH. There was mild focal basal hypertrophy of the septum. Systolic function was normal. The estimated ejection fraction was in the range of 55% to 60%. Wall motion was normal; there were no regional wall motion abnormalities. - Aortic valve: Mild regurgitation. - Tricuspid valve: Moderate regurgitation. - Pulmonary arteries: Systolic pressure was mildly increased. PA peak pressure: 23m Hg (S). Impressions:  - Compared to 08/26/11, LV function remains normal; moderate TR and mild AI now evident.  Last nuc study was 08/2011 that was normal.  Now a fib with rate 120 to 38- at times with slow HR appears junctional with a sinus beat.   EKG with a fib rate in 80s and no acute changes.   Troponin neg 0.01 CK 85  Hx of pulmonary nodules since 2007 without work up  at her request.  Stable by xray. Is active, lives on second floor ans walks up steps without problems, does her own housekeeping.  Has not had recent falls.   Past Medical History  Diagnosis Date  . Hypertension   . Lung nodule     CT of chest 2007-left loewr lobar mass- evaluated by pulmonary- patient refused further work up  . Tobacco abuse   . Vertigo   . Abdominal pain, unspecified site 08/17/2012  . Other and unspecified hyperlipidemia 08/17/2012  . Loss of weight 01/19/2013  . Allergic state 06/22/2013  . Allergy   . Osteoporosis   . Syncope     fall  . Increased urinary frequency 01/02/2014  . Glaucoma 07/06/2014    Past Surgical History  Procedure Laterality Date  . Cholecystectomy    . Cesarean section    . Hernia repair    . Abdominal hysterectomy      Family History  Problem Relation Age of Onset  . Heart disease Mother   . Hypertension Mother   . Diabetes Mother   . Diabetes    . Cancer      lung- in distant releatives  . Cancer Brother    Social History:  reports that she has been smoking Cigarettes.  She has a 6 pack-year smoking history. She has never used smokeless tobacco. She reports that she does not  drink alcohol or use illicit drugs.  Allergies:  Allergies  Allergen Reactions  . Ciprofloxacin Other (See Comments)    myalgias  . Fexofenadine Other (See Comments)    unknown  . Penicillins Other (See Comments)    OUTPATIENT MEDICATIONS: No current facility-administered medications on file prior to encounter.   Current Outpatient Prescriptions on File Prior to Encounter  Medication Sig Dispense Refill  . aspirin 81 MG chewable tablet Chew 1 tablet (81 mg total) by mouth daily. 30 tablet 11  . calcium-vitamin D (OSCAL WITH D 500-200) 500-200 MG-UNIT per tablet Take 1 tablet by mouth daily at 12 noon.     . cetirizine (ZYRTEC) 10 MG tablet Take 10 mg by mouth every morning.    . diltiazem (TIAZAC) 240 MG 24 hr capsule TAKE 1 CAPSULE (240 MG TOTAL)  BY MOUTH DAILY. 30 capsule 6  . fish oil-omega-3 fatty acids 1000 MG capsule Take 2 g by mouth daily at 12 noon.     . metoprolol succinate (TOPROL-XL) 25 MG 24 hr tablet TAKE 1 TABLET (25 MG TOTAL) BY MOUTH DAILY. 90 tablet 1  . Multiple Vitamin (MULTIVITAMIN) tablet Take 1 tablet by mouth daily at 12 noon.     . Saline (SODIUM CHLORIDE) 0.65 % SOLN Place 1 spray into the nose as needed for congestion. 2 sprays each nostril once daily as needed    . losartan (COZAAR) 25 MG tablet Take 1 tablet (25 mg total) by mouth daily. (Patient not taking: Reported on 08/28/2014) 30 tablet 2  . mupirocin ointment (BACTROBAN) 2 % Place 1 application into the nose daily as needed. Apply to nares via clean qtip prn (Patient not taking: Reported on 09/25/2014) 22 g 0  . trimethoprim-polymyxin b (POLYTRIM) ophthalmic solution Place 2 drops into the left eye every 6 (six) hours. (Patient not taking: Reported on 09/25/2014) 10 mL 0    Results for orders placed or performed during the hospital encounter of 09/25/14 (from the past 48 hour(s))  Urinalysis, Routine w reflex microscopic (not at [facility])     Status: Abnormal   Collection Time: 09/25/14  2:19 PM  Result Value Ref Range   Color, Urine YELLOW YELLOW   APPearance CLEAR CLEAR   Specific Gravity, Urine 1.005 1.005 - 1.030   pH 7.0 5.0 - 8.0   Glucose, UA NEGATIVE NEGATIVE mg/dL   Hgb urine dipstick SMALL (A) NEGATIVE   Bilirubin Urine NEGATIVE NEGATIVE   Ketones, ur NEGATIVE NEGATIVE mg/dL   Protein, ur NEGATIVE NEGATIVE mg/dL   Urobilinogen, UA 0.2 0.0 - 1.0 mg/dL   Nitrite NEGATIVE NEGATIVE   Leukocytes, UA NEGATIVE NEGATIVE  Urine microscopic-add on     Status: None   Collection Time: 09/25/14  2:19 PM  Result Value Ref Range   WBC, UA 0-2 <3 WBC/hpf   RBC / HPF 0-2 <3 RBC/hpf   Bacteria, UA RARE RARE  Comprehensive metabolic panel     Status: Abnormal   Collection Time: 09/25/14  2:40 PM  Result Value Ref Range   Sodium 142 135 - 145 mmol/L    Potassium 3.7 3.5 - 5.1 mmol/L   Chloride 111 101 - 111 mmol/L   CO2 23 22 - 32 mmol/L   Glucose, Bld 130 (H) 65 - 99 mg/dL   BUN 8 6 - 20 mg/dL   Creatinine, Ser 0.71 0.44 - 1.00 mg/dL   Calcium 9.0 8.9 - 10.3 mg/dL   Total Protein 6.8 6.5 - 8.1 g/dL   Albumin 3.6  3.5 - 5.0 g/dL   AST 27 15 - 41 U/L   ALT 19 14 - 54 U/L   Alkaline Phosphatase 63 38 - 126 U/L   Total Bilirubin 0.6 0.3 - 1.2 mg/dL   GFR calc non Af Amer >60 >60 mL/min   GFR calc Af Amer >60 >60 mL/min    Comment: (NOTE) The eGFR has been calculated using the CKD EPI equation. This calculation has not been validated in all clinical situations. eGFR's persistently <60 mL/min signify possible Chronic Kidney Disease.    Anion gap 8 5 - 15  CBC with Differential     Status: Abnormal   Collection Time: 09/25/14  2:40 PM  Result Value Ref Range   WBC 10.2 4.0 - 10.5 K/uL   RBC 4.66 3.87 - 5.11 MIL/uL   Hemoglobin 14.1 12.0 - 15.0 g/dL   HCT 40.4 36.0 - 46.0 %   MCV 86.7 78.0 - 100.0 fL   MCH 30.3 26.0 - 34.0 pg   MCHC 34.9 30.0 - 36.0 g/dL   RDW 13.3 11.5 - 15.5 %   Platelets 259 150 - 400 K/uL   Neutrophils Relative % 87 (H) 43 - 77 %   Neutro Abs 8.9 (H) 1.7 - 7.7 K/uL   Lymphocytes Relative 7 (L) 12 - 46 %   Lymphs Abs 0.7 0.7 - 4.0 K/uL   Monocytes Relative 6 3 - 12 %   Monocytes Absolute 0.6 0.1 - 1.0 K/uL   Eosinophils Relative 0 0 - 5 %   Eosinophils Absolute 0.0 0.0 - 0.7 K/uL   Basophils Relative 0 0 - 1 %   Basophils Absolute 0.0 0.0 - 0.1 K/uL  Protime-INR     Status: None   Collection Time: 09/25/14  2:40 PM  Result Value Ref Range   Prothrombin Time 14.0 11.6 - 15.2 seconds   INR 1.06 0.00 - 1.49  CK     Status: None   Collection Time: 09/25/14  2:40 PM  Result Value Ref Range   Total CK 85 38 - 234 U/L  I-stat troponin, ED     Status: None   Collection Time: 09/25/14  3:01 PM  Result Value Ref Range   Troponin i, poc 0.01 0.00 - 0.08 ng/mL   Comment 3            Comment: Due to the  release kinetics of cTnI, a negative result within the first hours of the onset of symptoms does not rule out myocardial infarction with certainty. If myocardial infarction is still suspected, repeat the test at appropriate intervals.    Dg Chest 2 View  09/25/2014   CLINICAL DATA:  81 year old female with fall and chest pain.  EXAM: CHEST  2 VIEW  COMPARISON:  Chest radiograph dated 07/01/2014 and chest CT dated 08/12/2005 .  FINDINGS: Two views of the chest demonstrate emphysematous changes of the lungs. There is no focal consolidation. No pleural effusion or pneumothorax. Multiple bilateral pulmonary nodules similar to the prior study most compatible with metastatic disease. Left upper lobe pleural-based density similar to prior study likely corresponding to the pleural based mass seen on the CT dated 08/12/2005. The cardiomediastinal silhouette is within normal limits. There is osteopenia. Degenerative changes of the spine.  IMPRESSION: No definite acute/traumatic intrathoracic pathology.  Emphysema.  No focal consolidation or pneumothorax.  Multiple bilateral pulmonary nodules grossly similar to the prior study and concerning for metastatic disease.   Electronically Signed   By: Milas Hock  Radparvar M.D.   On: 09/25/2014 15:09   Dg Lumbar Spine Complete  09/25/2014   CLINICAL DATA:  81 year old female with a history of fall and lower back pain  EXAM: LUMBAR SPINE - COMPLETE 4+ VIEW  COMPARISON:  06/28/2013, 08/07/2009  FINDINGS: Apex right scoliotic curvature of the lumbar spine, centered at L3. There is associated multilevel degenerative disc disease and facet disease.  Diffuse osteopenia.  Relative maintenance of the anatomic alignment on the lateral view. There is mild retrolisthesis of L1 on L2 and L2 on L3, as well as mild anterolisthesis of L4 on L5. This may be projectional given the scoliotic curvature.  Vertebral body heights maintained, similar to the comparison.  No fracture line identified.   Dense atherosclerotic calcifications of the abdomen and pelvis.  IMPRESSION: No radiographic evidence of acute fracture or malalignment of the lumbar spine, however, osteopenia limits evaluation for nondisplaced fractures, and if there is ongoing concern for acute injury, MRI may be considered as a more sensitive test.  Scoliotic curvature with associated degenerative changes.  Atherosclerosis.  Signed,  Dulcy Fanny. Earleen Newport, DO  [facility]  [facility]   Electronically Signed   By: Corrie Mckusick D.O.   On: 09/25/2014 15:05   Ct Head Wo Contrast  09/25/2014   CLINICAL DATA:  Syncopal episode. Unknown period of loss of consciousness. Fell earlier today. Patient does not complain of headache.  EXAM: CT HEAD WITHOUT CONTRAST  TECHNIQUE: Contiguous axial images were obtained from the base of the skull through the vertex without intravenous contrast.  COMPARISON:  None.  FINDINGS: Global atrophy with small vessel disease. No acute stroke or hemorrhage. No mass lesion or extra-axial fluid. Hydrocephalus ex vacuo.  There is no visible skull fracture or worrisome osseous lesion. No significant scalp hematoma.  Moderately advanced vascular calcification. Paranasal sinuses show no significant fluid accumulation.  IMPRESSION: Chronic changes as described.  No acute intracranial findings.   Electronically Signed   By: Staci Righter M.D.   On: 09/25/2014 15:27    ROS: General:no colds or fevers, no weight changes Skin:no rashes or ulcers HEENT:no blurred vision, no congestion CV:see HPI PUL:see HPI GI:no diarrhea constipation or melena, no indigestion GU:no hematuria, no dysuria MS:no joint pain, no claudication Neuro:+ syncope today first since last year- though last year she had blurred visoin first, she did not this year,  no lightheadedness Endo:no diabetes, no thyroid disease   Blood pressure 143/63, pulse 61, temperature 98.3 F (36.8 C), temperature source Oral,  resp. rate 22, height '5\' 1"'  (1.549 m), weight 108 lb (48.988 kg), SpO2 94 %.  Wt Readings from Last 3 Encounters:  09/25/14 108 lb (48.988 kg)  08/28/14 105 lb 8 oz (47.854 kg)  07/01/14 105 lb 8 oz (47.854 kg)    PE: General:Pleasant affect, NAD Skin:Warm and dry, brisk capillary refill HEENT:normocephalic, sclera clear, mucus membranes moist Neck:supple, no JVD, no bruits  Heart:irreg irreg  without murmur, gallup, rub or click Lungs:clear, ant. Pt could not roll or sit up due to back pain.  without rales, rhonchi, or wheezes HAL:PFXT, non tender, + BS, do not palpate liver spleen or masses Ext:no lower ext edema, 2+ pedal pulses, 2+ radial pulses Neuro:alert and oriented X 3, MAE, follows commands, + facial symmetry    Assessment/Plan Principal Problem:   Syncope- a fib may have been cause of her syncope but unsure Active Problems:   Vitamin D deficiency   Essential hypertension   Osteoporosis  Dystrophic nail   Aortic insufficiency   Hyperlipidemia   Atrial fibrillation with RVR- rate is currently to 120 and 38.  She has been on her Cardizem and her metoprolol.  Tachy brady syndrome. CHA2DS2VASc score of 5 - appears to be going back to SR.  Will revisit anticoagulation- though for now ASA alone.   pt is DNR    Will have EP see in AM for further recommendations.     [place]  Nurse Practitioner Certified [place] Pager [phone] or after 5pm or weekends call [phone] 09/25/2014, 5:28 PM   The patient was seen in the emergency room with Cecilie Kicks, NP-C. she has a past history of paroxysmal atrial fibrillation.  Her last episode of atrial fibrillation was in April 2015 that she is aware of.  She was in her usual state of health this morning when she passed out.  She realized that she was weak but she was unaware that her heart rate was going rapid and irregular.  When EMS arrived she was said to have a heart rate of 200 and irregular.  We  don't have any of those strips available to evaluate.  She was given IV diltiazem 10 mg by EMS.  Vital signs on arrival here showed a heart rate of 96 in atrial fibrillation.  As I was examining her this evening her heart rate was shifting between 80 and 130 while on IV Cardizem 10 mg per hour drip.  Strips taken earlier today show occasional marked slowing of her pulse to 38 and showed junctional rhythm with occasional sinus beats.  She appears to have tachybradycardia syndrome.  The patient states that she has been compliant with her medications at home which have included diltiazem CD 240 mg daily and metoprolol succinate 25 mg daily.  It is surprising that taking this much medication that her heart rate would have been in the 200 range.  I raised the question of the possible need for a permanent pacemaker to enable [place] to manage her rapid atrial fibrillation with medication without causing undue slowing of her rate which could lead to further syncopal episodes.  She would be willing to consider the idea of a pacemaker.  She is reluctant to proceed with full anticoagulation because of her history of sudden falls.  With today's fall she has developed severe low back pain and difficulty moving in bed despite the lack of any objective evidence of spine fracture. We will ask EP to round on her tomorrow to give further insight into her management. Of interest is the fact that the patient does have what appears to be multiple metastatic lesions in her lungs although these have remained stable over the years and she is asymptomatic in that regard.

## 2014-09-25 NOTE — H&P (Signed)
[facility] History and Physical                                                                                    Beth [name], is a 81 y.o. female  MRN: [id number]   DOB - Jul 31, 1923  Admit Date - 09/25/2014  Outpatient Primary MD for the patient is Penni Homans, MD  Referring MD: Alvino Chapel / ER  Consulting MD: Mare Ferrari / Cardiology  With History of -  Past Medical History  Diagnosis Date  . Hypertension   . Lung nodule     CT of chest 2007-left loewr lobar mass- evaluated by pulmonary- patient refused further work up  . Tobacco abuse   . Vertigo   . Abdominal pain, unspecified site 08/17/2012  . Other and unspecified hyperlipidemia 08/17/2012  . Loss of weight 01/19/2013  . Allergic state 06/22/2013  . Allergy   . Osteoporosis   . Syncope     fall  . Increased urinary frequency 01/02/2014  . Glaucoma 07/06/2014      Past Surgical History  Procedure Laterality Date  . Cholecystectomy    . Cesarean section    . Hernia repair    . Abdominal hysterectomy      in for   Chief Complaint  Patient presents with  . Fall     HPI This is a 81 year old female patient with history of hypertension, solitary pulmonary nodule, former tobacco abuse and vertigo with apparent frequent falls. Last admitted April 2015 after having an syncopal event related to paroxysmal atrial fibrillation with rapid ventricular response. During that admission she was found to have broken ribs with the fall. It should been evaluated by cardiology. She converted to sinus rhythm on Cardizem. CHADVASC was 5 but given history of frequent falls daughter and patient opted for aspirin only. Patient returns today with another syncopal event in setting of age or fibrillation with RVR heart rates in the 200s upon presentation of EMS.  Upon arrival to the ER patient was briefly tachycardic but this improved with IV Cardizem. She was afebrile. Radiographic evaluation revealed no evidence of acute bony  trauma. CT of the head did not reveal any acute injuries. Toward data was unremarkable except for mild hyperglycemia glucose 130. Primarily complaining of low back pain since the fall. In questioning her about any symptomatology prior to the fall she is not had any awareness of palpitations. She's not had any chest pain or dyspnea on exertion, no excessive fatigue after activity. She has been complaining of progressive decrease in energy for the past 4-5 years noting she goes to sleep easily when sitting in a chair. She is also complaining of fingernail splitting and losing her hair and this has been previously evaluated by primary care physician.   Review of Systems   In addition to the HPI above,  No Fever-chills, myalgias or other constitutional symptoms No Headache, changes with Vision or hearing, new weakness, tingling, numbness in any extremity, No problems swallowing food or Liquids, indigestion/reflux No Chest pain, Cough or Shortness of Breath, palpitations, orthopnea or DOE No Abdominal pain, N/V; no melena or hematochezia, no dark tarry stools, Bowel movements are regular,  No dysuria, hematuria or flank pain No new skin rashes, lesions, masses or bruises, No new joints pains-aches No recent weight gain or loss No polyuria, polydypsia or polyphagia,  *A full 10 point Review of Systems was done, except as stated above, all other Review of Systems were negative.  Social History History  Substance Use Topics  . Smoking status: Current Every Day Smoker -- 0.10 packs/day for 60 years    Types: Cigarettes  . Smokeless tobacco: Never Used  . Alcohol Use: No    Resides at: Private residence  Lives with: Alone  Ambulatory status: Assistive devices but apparently has frequent falls   Family History Family History  Problem Relation Age of Onset  . Heart disease Mother   . Hypertension Mother   . Diabetes Mother   . Diabetes    . Cancer      lung- in distant releatives  .  Cancer Brother      Prior to Admission medications   Medication Sig Start Date End Date Taking? Authorizing Provider  aspirin 81 MG chewable tablet Chew 1 tablet (81 mg total) by mouth daily. 07/02/13  Yes Orson Eva, MD  calcium-vitamin D (OSCAL WITH D 500-200) 500-200 MG-UNIT per tablet Take 1 tablet by mouth daily at 12 noon.    Yes Historical Provider, MD  cetirizine (ZYRTEC) 10 MG tablet Take 10 mg by mouth every morning.   Yes Historical Provider, MD  diltiazem (TIAZAC) 240 MG 24 hr capsule TAKE 1 CAPSULE (240 MG TOTAL) BY MOUTH DAILY. 08/28/14  Yes Mosie Lukes, MD  fish oil-omega-3 fatty acids 1000 MG capsule Take 2 g by mouth daily at 12 noon.    Yes Historical Provider, MD  metoprolol succinate (TOPROL-XL) 25 MG 24 hr tablet TAKE 1 TABLET (25 MG TOTAL) BY MOUTH DAILY. 08/28/14  Yes Mosie Lukes, MD  Multiple Vitamin (MULTIVITAMIN) tablet Take 1 tablet by mouth daily at 12 noon.    Yes Historical Provider, MD  Saline (SODIUM CHLORIDE) 0.65 % SOLN Place 1 spray into the nose as needed for congestion. 2 sprays each nostril once daily as needed   Yes Historical Provider, MD  losartan (COZAAR) 25 MG tablet Take 1 tablet (25 mg total) by mouth daily. Patient not taking: Reported on 08/28/2014 01/02/14   Mosie Lukes, MD  mupirocin ointment (BACTROBAN) 2 % Place 1 application into the nose daily as needed. Apply to nares via clean qtip prn Patient not taking: Reported on 09/25/2014 08/28/14   Mosie Lukes, MD  trimethoprim-polymyxin b (POLYTRIM) ophthalmic solution Place 2 drops into the left eye every 6 (six) hours. Patient not taking: Reported on 09/25/2014 04/11/14   Mosie Lukes, MD    Allergies  Allergen Reactions  . Ciprofloxacin Other (See Comments)    myalgias  . Fexofenadine Other (See Comments)    unknown  . Penicillins Other (See Comments)    Physical Exam  Vitals  Blood pressure 143/63, pulse 61, temperature 98.3 F (36.8 C), temperature source Oral, resp. rate 22,  height 5\' 1"  (1.549 m), weight 108 lb (48.988 kg), SpO2 94 %.   General:  In no acute distress, appears healthy and well nourished-hard of hearing  Psych:  Normal affect, Denies Suicidal or Homicidal ideations, Awake Alert, Oriented X 3. Speech and thought patterns are clear and appropriate, no apparent short term memory deficits  Neuro:   No focal neurological deficits, CN II through XII intact, Strength 5/5 all 4 extremities, Sensation intact all  4 extremities.  ENT:  Ears and Eyes appear Normal, Conjunctivae clear, PER. Moist oral mucosa without erythema or exudates.  Neck:  Supple, No lymphadenopathy appreciated  Respiratory:  Symmetrical chest wall movement, Good air movement bilaterally, CTAB. Room Air  Cardiac:  Regular and in atrial fibrillation with ventricular rate less than 100 bpm, No Murmurs, no LE edema noted, no JVD, No carotid bruits, peripheral pulses palpable at 2+  Abdomen:  Positive bowel sounds, Soft, Non tender, Non distended,  No masses appreciated, no obvious hepatosplenomegaly  Skin:  No Cyanosis, Normal Skin Turgor, No Skin Rash or Bruise.  Extremities: Symmetrical without obvious trauma or injury,  no effusions.  Data Review  CBC  Recent Labs Lab 09/25/14 1440  WBC 10.2  HGB 14.1  HCT 40.4  PLT 259  MCV 86.7  MCH 30.3  MCHC 34.9  RDW 13.3  LYMPHSABS 0.7  MONOABS 0.6  EOSABS 0.0  BASOSABS 0.0    Chemistries   Recent Labs Lab 09/25/14 1440  NA 142  K 3.7  CL 111  CO2 23  GLUCOSE 130*  BUN 8  CREATININE 0.71  CALCIUM 9.0  AST 27  ALT 19  ALKPHOS 63  BILITOT 0.6    estimated creatinine clearance is 36.7 mL/min (by C-G formula based on Cr of 0.71).  No results for input(s): TSH, T4TOTAL, T3FREE, THYROIDAB in the last 72 hours.  Invalid input(s): FREET3  Coagulation profile  Recent Labs Lab 09/25/14 1440  INR 1.06    No results for input(s): DDIMER in the last 72 hours.  Cardiac Enzymes No results for input(s): CKMB,  TROPONINI, MYOGLOBIN in the last 168 hours.  Invalid input(s): CK  Invalid input(s): POCBNP  Urinalysis    Component Value Date/Time   COLORURINE YELLOW 09/25/2014 1419   APPEARANCEUR CLEAR 09/25/2014 1419   LABSPEC 1.005 09/25/2014 1419   PHURINE 7.0 09/25/2014 1419   GLUCOSEU NEGATIVE 09/25/2014 1419   GLUCOSEU NEGATIVE 01/02/2014 0952   HGBUR SMALL* 09/25/2014 1419   HGBUR moderate 12/22/2009 1451   BILIRUBINUR NEGATIVE 09/25/2014 1419   KETONESUR NEGATIVE 09/25/2014 1419   PROTEINUR NEGATIVE 09/25/2014 1419   UROBILINOGEN 0.2 09/25/2014 1419   NITRITE NEGATIVE 09/25/2014 1419   LEUKOCYTESUR NEGATIVE 09/25/2014 1419    Imaging results:   Dg Chest 2 View  09/25/2014   CLINICAL DATA:  81 year old female with fall and chest pain.  EXAM: CHEST  2 VIEW  COMPARISON:  Chest radiograph dated 07/01/2014 and chest CT dated 08/12/2005 .  FINDINGS: Two views of the chest demonstrate emphysematous changes of the lungs. There is no focal consolidation. No pleural effusion or pneumothorax. Multiple bilateral pulmonary nodules similar to the prior study most compatible with metastatic disease. Left upper lobe pleural-based density similar to prior study likely corresponding to the pleural based mass seen on the CT dated 08/12/2005. The cardiomediastinal silhouette is within normal limits. There is osteopenia. Degenerative changes of the spine.  IMPRESSION: No definite acute/traumatic intrathoracic pathology.  Emphysema.  No focal consolidation or pneumothorax.  Multiple bilateral pulmonary nodules grossly similar to the prior study and concerning for metastatic disease.   Electronically Signed   By: Anner Crete M.D.   On: 09/25/2014 15:09   Dg Lumbar Spine Complete  09/25/2014   CLINICAL DATA:  81 year old female with a history of fall and lower back pain  EXAM: LUMBAR SPINE - COMPLETE 4+ VIEW  COMPARISON:  06/28/2013, 08/07/2009  FINDINGS: Apex right scoliotic curvature of the lumbar spine,  centered  at L3. There is associated multilevel degenerative disc disease and facet disease.  Diffuse osteopenia.  Relative maintenance of the anatomic alignment on the lateral view. There is mild retrolisthesis of L1 on L2 and L2 on L3, as well as mild anterolisthesis of L4 on L5. This may be projectional given the scoliotic curvature.  Vertebral body heights maintained, similar to the comparison.  No fracture line identified.  Dense atherosclerotic calcifications of the abdomen and pelvis.  IMPRESSION: No radiographic evidence of acute fracture or malalignment of the lumbar spine, however, osteopenia limits evaluation for nondisplaced fractures, and if there is ongoing concern for acute injury, MRI may be considered as a more sensitive test.  Scoliotic curvature with associated degenerative changes.  Atherosclerosis.  Signed,  Dulcy Fanny. Earleen Newport, DO  [facility]  [facility]   Electronically Signed   By: Corrie Mckusick D.O.   On: 09/25/2014 15:05   Ct Head Wo Contrast  09/25/2014   CLINICAL DATA:  Syncopal episode. Unknown period of loss of consciousness. Fell earlier today. Patient does not complain of headache.  EXAM: CT HEAD WITHOUT CONTRAST  TECHNIQUE: Contiguous axial images were obtained from the base of the skull through the vertex without intravenous contrast.  COMPARISON:  None.  FINDINGS: Global atrophy with small vessel disease. No acute stroke or hemorrhage. No mass lesion or extra-axial fluid. Hydrocephalus ex vacuo.  There is no visible skull fracture or worrisome osseous lesion. No significant scalp hematoma.  Moderately advanced vascular calcification. Paranasal sinuses show no significant fluid accumulation.  IMPRESSION: Chronic changes as described.  No acute intracranial findings.   Electronically Signed   By: Staci Righter M.D.   On: 09/25/2014 15:27     EKG: (Independently reviewed) atrial fibrillation, which criteria consistent with LVH,  complete right bundle branch block   Assessment & Plan  Principal Problem:   Syncope -Admit to stepdown -Etiology appears to be related to atrial fibrillation with RVR currently rate controlled -Similar presentation April 2015  Active Problems:   Atrial fibrillation with RVR -Currently rate controlled -Continue preadmission Toprol and Cardizem -CHADVASc = 5 -Reported history of recurrent falls and patient lives alone -have asked cardiology to see patient in consultation and make comment regarding safety and efficacy of beginning anticoagulation -Initially had ordered full dose IV heparin but have opted based on above to only utilize Lovenox for DVT prophylaxis at this juncture -Continue baby aspirin -No indication at this juncture to repeat echocardiogram therefore will defer to cardiology if indicated based on their evaluation    Essential hypertension -Blood pressure controlled on above agents but blood pressure otherwise soft and will hold Cozaar in favor of rate controlling agents    Aortic insufficiency -Noted on echocardiogram last year     Hyperlipidemia -Continue fish oil    Vitamin D deficiency/Osteoporosis -Was on calcium with vitamin D as well as multiple vitamins at home    Dystrophic nail/hair loss -Recent TSH this month was normal at 2.31    DVT Prophylaxis: Lovenox  Family Communication:   Daughter at bedside  Code Status:  DO NOT RESUSCITATE  Condition:  Stable  Discharge disposition: Anticipate discharge to home pending evaluation and workup regarding atrial fibrillation  Time spent in minutes : 60      ELLIS,ALLISON L. ANP on 09/25/2014 at 5:17 PM  Between 7am to 7pm - Pager - [phone]  After 7pm go to [email or web address] - password [email or web address]  And look for the night coverage person covering me  after hours  [facility]

## 2014-09-25 NOTE — Plan of Care (Signed)
RN paged this NP because pt had a pause on tele of 3.7 secs. Pt has tachy-brady syndrome followed by cardiology. Pt is on Metoprolol and Cardizem po. Plan is to have EP see her in the am for treatment plan. Her HR has been between 40-120 which is about the same as previously documented earlier today. Only difference is the pause. Will obtain a 12 lead EKG and if recurs, give cards a call. Order placed to hold metoprolol and cardizem in the am until giving is approved by attending and/or cards.  Clance Boll, NP [facility]

## 2014-09-26 ENCOUNTER — Encounter (HOSPITAL_COMMUNITY)
Admission: EM | Disposition: A | Discharge status: Skilled Nursing Facility | Payer: Self-pay | Source: Home / Self Care | Attending: Family Medicine

## 2014-09-26 ENCOUNTER — Inpatient Hospital Stay (HOSPITAL_COMMUNITY): Payer: Medicare Other

## 2014-09-26 DIAGNOSIS — R911 Solitary pulmonary nodule: Secondary | ICD-10-CM

## 2014-09-26 DIAGNOSIS — R0600 Dyspnea, unspecified: Secondary | ICD-10-CM

## 2014-09-26 DIAGNOSIS — I495 Sick sinus syndrome: Secondary | ICD-10-CM

## 2014-09-26 DIAGNOSIS — I272 Pulmonary hypertension, unspecified: Secondary | ICD-10-CM

## 2014-09-26 DIAGNOSIS — I459 Conduction disorder, unspecified: Secondary | ICD-10-CM

## 2014-09-26 HISTORY — PX: EP IMPLANTABLE DEVICE: SHX172B

## 2014-09-26 LAB — COMPREHENSIVE METABOLIC PANEL
ALBUMIN: 3.4 g/dL — AB (ref 3.5–5.0)
ALT: 17 U/L (ref 14–54)
AST: 21 U/L (ref 15–41)
Alkaline Phosphatase: 64 U/L (ref 38–126)
Anion gap: 9 (ref 5–15)
BUN: 8 mg/dL (ref 6–20)
CALCIUM: 8.9 mg/dL (ref 8.9–10.3)
CO2: 24 mmol/L (ref 22–32)
Chloride: 106 mmol/L (ref 101–111)
Creatinine, Ser: 0.66 mg/dL (ref 0.44–1.00)
GFR calc Af Amer: >60 mL/min (ref >60)
Glucose, Bld: 97 mg/dL (ref 65–99)
Potassium: 3.3 mmol/L — ABNORMAL LOW (ref 3.5–5.1)
Sodium: 139 mmol/L (ref 135–145)
Total Bilirubin: 0.7 mg/dL (ref 0.3–1.2)
Total Protein: 6.6 g/dL (ref 6.5–8.1)

## 2014-09-26 LAB — CBC
HCT: 39.8 % (ref 36.0–46.0)
HEMOGLOBIN: 13.6 g/dL (ref 12.0–15.0)
MCH: 30 pg (ref 26.0–34.0)
MCHC: 34.2 g/dL (ref 30.0–36.0)
MCV: 87.7 fL (ref 78.0–100.0)
Platelets: 242 10*3/uL (ref 150–400)
RBC: 4.54 MIL/uL (ref 3.87–5.11)
RDW: 13.3 % (ref 11.5–15.5)
WBC: 7.8 10*3/uL (ref 4.0–10.5)

## 2014-09-26 LAB — TROPONIN I
Troponin I: <0.03 ng/mL (ref <0.031)
Troponin I: <0.03 ng/mL (ref <0.031)

## 2014-09-26 SURGERY — PACEMAKER IMPLANT
Anesthesia: LOCAL

## 2014-09-26 MED ORDER — LIDOCAINE HCL (PF) 1 % IJ SOLN
INTRAMUSCULAR | Status: DC | PRN
  Administered 2014-09-26: 60 mL

## 2014-09-26 MED ORDER — SODIUM CHLORIDE 0.9 % IR SOLN
80.0000 mg | Status: AC
  Administered 2014-09-26: 80 mg
  Filled 2014-09-26: qty 2

## 2014-09-26 MED ORDER — VANCOMYCIN HCL IN DEXTROSE 1-5 GM/200ML-% IV SOLN
1000.0000 mg | INTRAVENOUS | Status: DC
  Filled 2014-09-26 (×2): qty 200

## 2014-09-26 MED ORDER — LIDOCAINE HCL (PF) 1 % IJ SOLN
INTRAMUSCULAR | Status: AC
  Filled 2014-09-26: qty 60

## 2014-09-26 MED ORDER — OFF THE BEAT BOOK
Freq: Once | Status: AC
Start: 2014-09-26 — End: 2014-09-26
  Administered 2014-09-26: 22:00:00
  Filled 2014-09-26 (×2): qty 1

## 2014-09-26 MED ORDER — SODIUM CHLORIDE 0.9 % IR SOLN
Status: AC
  Filled 2014-09-26: qty 2

## 2014-09-26 MED ORDER — MIDAZOLAM HCL 5 MG/5ML IJ SOLN
INTRAMUSCULAR | Status: AC
  Filled 2014-09-26: qty 5

## 2014-09-26 MED ORDER — VANCOMYCIN HCL 1000 MG IV SOLR
1000.0000 mg | INTRAVENOUS | Status: DC | PRN
  Administered 2014-09-26: 1000 mg via INTRAVENOUS

## 2014-09-26 MED ORDER — VANCOMYCIN HCL IN DEXTROSE 1-5 GM/200ML-% IV SOLN
1000.0000 mg | Freq: Two times a day (BID) | INTRAVENOUS | Status: AC
  Administered 2014-09-27: 1000 mg via INTRAVENOUS
  Filled 2014-09-26: qty 200

## 2014-09-26 MED ORDER — FENTANYL CITRATE (PF) 100 MCG/2ML IJ SOLN
INTRAMUSCULAR | Status: AC
  Filled 2014-09-26: qty 2

## 2014-09-26 MED ORDER — SODIUM CHLORIDE 0.9 % IV SOLN
INTRAVENOUS | Status: DC
  Administered 2014-09-26: 13:00:00 via INTRAVENOUS

## 2014-09-26 MED ORDER — HEPARIN (PORCINE) IN NACL 2-0.9 UNIT/ML-% IJ SOLN
INTRAMUSCULAR | Status: DC | PRN
  Administered 2014-09-26: 500 mL

## 2014-09-26 MED ORDER — ONDANSETRON HCL 4 MG/2ML IJ SOLN
4.0000 mg | Freq: Four times a day (QID) | INTRAMUSCULAR | Status: DC | PRN
  Administered 2014-09-27: 4 mg via INTRAVENOUS
  Filled 2014-09-26: qty 2

## 2014-09-26 MED ORDER — ACETAMINOPHEN 325 MG PO TABS: 325.0000 mg | ORAL_TABLET | ORAL | Status: DC | PRN

## 2014-09-26 MED ORDER — MIDAZOLAM HCL 5 MG/5ML IJ SOLN
INTRAMUSCULAR | Status: DC | PRN
  Administered 2014-09-26: 1 mg via INTRAVENOUS

## 2014-09-26 MED ORDER — CHLORHEXIDINE GLUCONATE 4 % EX LIQD: 60.0000 mL | Freq: Once | CUTANEOUS | Status: DC

## 2014-09-26 MED ORDER — CHLORHEXIDINE GLUCONATE 4 % EX LIQD
60.0000 mL | Freq: Once | CUTANEOUS | Status: AC
  Administered 2014-09-26: 4 via TOPICAL
  Filled 2014-09-26: qty 60

## 2014-09-26 MED ORDER — DILTIAZEM HCL ER BEADS 240 MG PO CP24
240.0000 mg | ORAL_CAPSULE | Freq: Every day | ORAL | Status: DC
  Administered 2014-09-27 – 2014-09-30 (×4): 240 mg via ORAL
  Filled 2014-09-26 (×10): qty 1

## 2014-09-26 MED ORDER — FENTANYL CITRATE (PF) 100 MCG/2ML IJ SOLN
INTRAMUSCULAR | Status: DC | PRN
  Administered 2014-09-26: 12.5 ug via INTRAVENOUS

## 2014-09-26 MED ORDER — FLECAINIDE ACETATE 50 MG PO TABS
50.0000 mg | ORAL_TABLET | Freq: Two times a day (BID) | ORAL | Status: DC
  Administered 2014-09-26 – 2014-09-30 (×8): 50 mg via ORAL
  Filled 2014-09-26 (×11): qty 1

## 2014-09-26 SURGICAL SUPPLY — 8 items
CABLE SURGICAL S-101-97-12 (CABLE) ×1 IMPLANT
LEAD CAPSURE NOVUS 45CM (Lead) ×1 IMPLANT
LEAD CAPSURE NOVUS 5076-52CM (Lead) ×1 IMPLANT
PACEMAKER ADAPTA DR ADDRL1 (Pacemaker) IMPLANT
PAD DEFIB LIFELINK (PAD) ×1 IMPLANT
PPM ADAPTA DR ADDRL1 (Pacemaker) ×2 IMPLANT
SHEATH COOK PEEL AWAY SET 7F (SHEATH) ×2 IMPLANT
TRAY PACEMAKER INSERTION (CUSTOM PROCEDURE TRAY) ×1 IMPLANT

## 2014-09-26 NOTE — Consult Note (Signed)
ELECTROPHYSIOLOGY CONSULT NOTE    Patient ID: Beth [name] MRN: [id number], DOB/AGE: 79-May-1925 79 y.o.  Admit date: 09/25/2014 Date of Consult: 09/26/2014  Primary Physician: Penni Homans, MD Primary Cardiologist: Nahser  Reason for Consultation: tachy-brady syndrome  HPI:  Beth [name] is a 81 y.o. female with a past medical history of hypertension, paroxysmal atrial fibrillation, syncope, and hyperlipidemia.  She has known paroxysmal atrial fibrillation and has not been felt to be a candidate for anticoagulation due to falls.  She was admitted yesterday for syncope and was found to have AF with RVR as well as up to 5 second post termination pauses with intermittent junctional rhythm.  EP has been asked to evaluate for treatment options.   Echocardiogram 06/2013 demonstrated EF 55-60%, no RWMA, moderate TR, LA 29.  Repeat echo pending this admission.  She currently denies chest pain, shortness of breath, LE edema, recent fevers, chills, nausea and vomiting.   She is complaining of persistent back pain.   Past Medical History  Diagnosis Date  . Hypertension   . Lung nodule     CT of chest 2007-left loewr lobar mass- evaluated by pulmonary- patient refused further work up  . Tobacco abuse   . Vertigo   . Abdominal pain, unspecified site 08/17/2012  . Other and unspecified hyperlipidemia 08/17/2012  . Loss of weight 01/19/2013  . Allergic state 06/22/2013  . Allergy   . Osteoporosis   . Syncope     fall  . Increased urinary frequency 01/02/2014  . Glaucoma 07/06/2014     Surgical History:  Past Surgical History  Procedure Laterality Date  . Cholecystectomy    . Cesarean section    . Hernia repair    . Abdominal hysterectomy       Prescriptions prior to admission  Medication Sig Dispense Refill Last Dose  . aspirin 81 MG chewable tablet Chew 1 tablet (81 mg total) by mouth daily. 30 tablet 11 09/25/2014 at Unknown time  . calcium-vitamin D (OSCAL WITH D 500-200)  500-200 MG-UNIT per tablet Take 1 tablet by mouth daily at 12 noon.    09/24/2014 at Unknown time  . cetirizine (ZYRTEC) 10 MG tablet Take 10 mg by mouth every morning.   09/25/2014 at Unknown time  . diltiazem (TIAZAC) 240 MG 24 hr capsule TAKE 1 CAPSULE (240 MG TOTAL) BY MOUTH DAILY. 30 capsule 6 09/25/2014 at Unknown time  . fish oil-omega-3 fatty acids 1000 MG capsule Take 2 g by mouth daily at 12 noon.    09/24/2014 at Unknown time  . metoprolol succinate (TOPROL-XL) 25 MG 24 hr tablet TAKE 1 TABLET (25 MG TOTAL) BY MOUTH DAILY. 90 tablet 1 09/25/2014 at 800  . Multiple Vitamin (MULTIVITAMIN) tablet Take 1 tablet by mouth daily at 12 noon.    Past Week at Unknown time  . Saline (SODIUM CHLORIDE) 0.65 % SOLN Place 1 spray into the nose as needed for congestion. 2 sprays each nostril once daily as needed   1 week ago  . losartan (COZAAR) 25 MG tablet Take 1 tablet (25 mg total) by mouth daily. (Patient not taking: Reported on 08/28/2014) 30 tablet 2 Not Taking at Unknown time  . mupirocin ointment (BACTROBAN) 2 % Place 1 application into the nose daily as needed. Apply to nares via clean qtip prn (Patient not taking: Reported on 09/25/2014) 22 g 0 Not Taking at Unknown time  . trimethoprim-polymyxin b (POLYTRIM) ophthalmic solution Place 2 drops into the left eye every  6 (six) hours. (Patient not taking: Reported on 09/25/2014) 10 mL 0 Completed Course at Unknown time    Inpatient Medications:  . aspirin  81 mg Oral Daily  . diltiazem  240 mg Oral Daily  . docusate sodium  100 mg Oral BID  . enoxaparin (LOVENOX) injection  40 mg Subcutaneous Q24H  . ketorolac  15 mg Intravenous Once  . loratadine  10 mg Oral Daily  . metoprolol succinate  25 mg Oral Daily  . omega-3 acid ethyl esters  2 g Oral Q1200  . sodium chloride  3 mL Intravenous Q12H    Allergies:  Allergies  Allergen Reactions  . Ciprofloxacin Other (See Comments)    myalgias  . Fexofenadine Other (See Comments)    unknown  .  Penicillins Other (See Comments)    History   Social History  . Marital Status: Widowed    Spouse Name: N/A  . Number of Children: N/A  . Years of Education: N/A   Occupational History  . Not on file.   Social History Main Topics  . Smoking status: Current Every Day Smoker -- 0.10 packs/day for 60 years    Types: Cigarettes  . Smokeless tobacco: Never Used  . Alcohol Use: No  . Drug Use: No  . Sexual Activity: No   Other Topics Concern  . Not on file   Social History Narrative     Family History  Problem Relation Age of Onset  . Heart disease Mother   . Hypertension Mother   . Diabetes Mother   . Diabetes    . Cancer      lung- in distant releatives  . Cancer Brother      Review of Systems: All other systems reviewed and are otherwise negative except as noted above.  Physical Exam: Filed Vitals:   09/25/14 1900 09/25/14 2005 09/26/14 0000 09/26/14 0400  BP: 150/72 133/82 150/61 168/59  Pulse:  102 75 77  Temp: 98.9 F (37.2 C) 98.3 F (36.8 C) 97.9 F (36.6 C) 98 F (36.7 C)  TempSrc: Oral Oral Oral Oral  Resp: 28 26 27 24   Height:      Weight:    102 lb 1.6 oz (46.312 kg)  SpO2:  93% 94% 90%    GEN- The patient is elderly appearing, alert and oriented x 3 today, extremely HOH HEENT: normocephalic, atraumatic; sclera clear, conjunctiva pink; hearing intact; oropharynx clear; neck supple, no JVP Lymph- no cervical lymphadenopathy Lungs- Clear to ausculation bilaterally, normal work of breathing.  No wheezes, rales, rhonchi Heart- Regular rate and rhythm, no murmurs, rubs or gallops  GI- soft, non-tender, non-distended, bowel sounds present  Extremities- no clubbing, cyanosis, or edema; DP/PT/radial pulses 2+ bilaterally MS- no significant deformity or atrophy Skin- warm and dry, no rash or lesion Psych- euthymic mood, full affect Neuro- strength and sensation are intact  Labs:   Lab Results  Component Value Date   WBC 7.8 09/26/2014   HGB  13.6 09/26/2014   HCT 39.8 09/26/2014   MCV 87.7 09/26/2014   PLT 242 09/26/2014    Recent Labs Lab 09/26/14 0501  NA 139  K 3.3*  CL 106  CO2 24  BUN 8  CREATININE 0.66  CALCIUM 8.9  PROT 6.6  BILITOT 0.7  ALKPHOS 64  ALT 17  AST 21  GLUCOSE 97      Radiology/Studies: Dg Chest 2 View 09/25/2014   CLINICAL DATA:  81 year old female with fall and chest pain.  EXAM: CHEST  2 VIEW  COMPARISON:  Chest radiograph dated 07/01/2014 and chest CT dated 08/12/2005 .  FINDINGS: Two views of the chest demonstrate emphysematous changes of the lungs. There is no focal consolidation. No pleural effusion or pneumothorax. Multiple bilateral pulmonary nodules similar to the prior study most compatible with metastatic disease. Left upper lobe pleural-based density similar to prior study likely corresponding to the pleural based mass seen on the CT dated 08/12/2005. The cardiomediastinal silhouette is within normal limits. There is osteopenia. Degenerative changes of the spine.  IMPRESSION: No definite acute/traumatic intrathoracic pathology.  Emphysema.  No focal consolidation or pneumothorax.  Multiple bilateral pulmonary nodules grossly similar to the prior study and concerning for metastatic disease.   Electronically Signed   By: Anner Crete M.D.   On: 09/25/2014 15:09   Ct Head Wo Contrast 09/25/2014   CLINICAL DATA:  Syncopal episode. Unknown period of loss of consciousness. Fell earlier today. Patient does not complain of headache.  EXAM: CT HEAD WITHOUT CONTRAST  TECHNIQUE: Contiguous axial images were obtained from the base of the skull through the vertex without intravenous contrast.  COMPARISON:  None.  FINDINGS: Global atrophy with small vessel disease. No acute stroke or hemorrhage. No mass lesion or extra-axial fluid. Hydrocephalus ex vacuo.  There is no visible skull fracture or worrisome osseous lesion. No significant scalp hematoma.  Moderately advanced vascular calcification. Paranasal  sinuses show no significant fluid accumulation.  IMPRESSION: Chronic changes as described.  No acute intracranial findings.   Electronically Signed   By: Staci Righter M.D.   On: 09/25/2014 15:27    DQQ:IWLNL rhythm, rate 69, LAD, non specific ST-T changes  TELEMETRY: atrial fibrillation with RVR, up to 5 second post termination pauses and intermittent junctional rhythm  Assessment/Plan: 1.  Paroxysmal atrial fibrillation/tachy-brady syndrome The patient has paroxysmal atrial fibrillation with symptomatic tachy-brady syndrome.  She has had syncopal spells presumed to be related to post termination pauses.  PPM is indicated.  Risks, benefits discussed with patient by Dr Lovena Le. She would like for her daughter to be involved in the decision about placing pacemaker. We will call her this morning.  Decision about long term anticoagulation will need to be revisited for CHADS2VASC of at least 4.  She has tolerated daily ASA. Will tentatively plan pacemaker implant later this afternoon pending discussion with her daughter.   2.  HTN Stable No change required today  3. Poor hearing - she needs a battery for her hearing aid.   Signed, Chanetta Marshall, NP 09/26/2014 7:21 AM  EP Attending  Patient seen and examined. She has symptomatic tachy-brady syndrome with rapid atrial fib and long post termination pauses. I have no drug that will treat both the atrial fib and the pauses. I would recommend she undergo PPM. She is considering her options and would like to speak to her daughter. I have discussed the risks/benefits/goals/expectations of PPM insertion and she wishes to proceed if she has to.  Mikle Bosworth.D.

## 2014-09-26 NOTE — Interval H&P Note (Signed)
History and Physical Interval Note:  09/26/2014 3:27 PM  Beth [name]  has presented today for surgery, with the diagnosis of bradycardia  The various methods of treatment have been discussed with the patient and family. After consideration of risks, benefits and other options for treatment, the patient has consented to  Procedure(s): Pacemaker Implant (N/A) as a surgical intervention .  The patient's history has been reviewed, patient examined, no change in status, stable for surgery.  I have reviewed the patient's chart and labs.  Questions were answered to the patient's satisfaction.     Cristopher Peru

## 2014-09-26 NOTE — Care Management Note (Addendum)
Case Management Note  Patient Details  Name: Beth [name] MRN: [id number] Date of Birth: July 18, 1923  Subjective/Objective:       Pt admitted for tachy-brady syndrome. Plan for PPM today.              Action/Plan: benefits check in process for Pradaxa. Will make pt aware of cost once completed. CM will continue to monitor for disposition needs.    Expected Discharge Date:                  Expected Discharge Plan:  [place]  In-House Referral:     Discharge planning Services  CM Consult  Post Acute Care Choice:    Choice offered to:     DME Arranged:    DME Agency:     HH Arranged:    [place] Agency:     Status of Service:  In process, will continue to follow  Medicare Important Message Given:    Date Medicare IM Given:    Medicare IM give by:    Date Additional Medicare IM Given:    Additional Medicare Important Message give by:     If discussed at [place] of Stay Meetings, dates discussed:    Additional Comments: 1150 09-30-14 Jacqlyn Krauss, RN,BSN [phone] Plan for d/c to SNF today. CSW assisting with disposition needs. No further needs from CM at this time.   [place], Pt uses [facility] in [place]. Pt was provided the 30 day free Pradaxa card. Pharmacy has medication available. Pt states she has Rx drug coverage. Co pay will be 35.00. CM did make pt's daughter aware.       Bethena Roys, RN 09/26/2014, 11:51 AM

## 2014-09-26 NOTE — Progress Notes (Signed)
Beth [name] KNL:[id number] DOB: 01/28/1924 DOA: 09/25/2014 PCP: Penni Homans, MD  Brief narrative:  81 y/o ? prior Afib foll chr by Dr. Acie Fredrickson 7.2 % stroke rate/year from a score of 5 solitary pulmonary nodule, HTN, vertigo, multiple falls felt secondary to atrial fibrillation, presbycusis, osteoporosis per DEXA scan 2012, prior rib fractures after falls, severe protein energy malnutrition, aortic insufficiency, pulmonary hypertension based on echocardiogram 09/26/14-PA peak 66 mmHg admitted 09/25/14 with a syncopal event resulting in a fall found to be in atrial fibrillation with RVR rates in the low 200s/180s given IV Cardizem and had converted to sinus rhythm at the time of initial encounter   Past medical history-As per Problem list Chart reviewed as below- Reviewed  Consultants:  Cardiology  Electrophysiology  Procedures:  Echocardiogram  Pacemaker placement  Antibiotics:  None   Subjective   Pleasant at time of my visit Very hard of hearing No chest pain No nausea no vomiting no shortness of breath no fever no chills no dark stool no tarry stool Has questions about pacemaker placement Dr. present in room   Objective    Interim History:   Telemetry: Atrial fibrillation   Objective: Filed Vitals:   09/26/14 0755 09/26/14 0900 09/26/14 1100 09/26/14 1205  BP:  138/72 140/113 162/63  Pulse:  89 81 86  Temp: 98.4 F (36.9 C)     TempSrc: Oral     Resp:  25 30 29   Height:      Weight:      SpO2:  93% 91% 89%    Intake/Output Summary (Last 24 hours) at 09/26/14 1349 Last data filed at 09/26/14 0849  Gross per 24 hour  Intake      0 ml  Output    600 ml  Net   -600 ml    Exam:  General: EOMI NCAT bitemporal wasting supraclavicular wasting Cardiovascular: S1-S2 regular rate rhythm slightly tachycardic no murmur no JVD Respiratory: Clinically clear no added sound Abdomen: Soft nontender nondistended no rebound or guarding Skin no lower  extremity edema Neuro extremely hard of hearing however pleasant and answers appropriately  Data Reviewed: Basic Metabolic Panel:  Recent Labs Lab 09/25/14 1440 09/26/14 0501  NA 142 139  K 3.7 3.3*  CL 111 106  CO2 23 24  GLUCOSE 130* 97  BUN 8 8  CREATININE 0.71 0.66  CALCIUM 9.0 8.9   Liver Function Tests:  Recent Labs Lab 09/25/14 1440 09/26/14 0501  AST 27 21  ALT 19 17  ALKPHOS 63 64  BILITOT 0.6 0.7  PROT 6.8 6.6  ALBUMIN 3.6 3.4*   No results for input(s): LIPASE, AMYLASE in the last 168 hours. No results for input(s): AMMONIA in the last 168 hours. CBC:  Recent Labs Lab 09/25/14 1440 09/26/14 0501  WBC 10.2 7.8  NEUTROABS 8.9*  --   HGB 14.1 13.6  HCT 40.4 39.8  MCV 86.7 87.7  PLT 259 242   Cardiac Enzymes:  Recent Labs Lab 09/25/14 1440 09/25/14 1748 09/25/14 2234 09/26/14 0501  CKTOTAL 85  --   --   --   TROPONINI  --  <0.03 <0.03 <0.03   BNP: Invalid input(s): POCBNP CBG: No results for input(s): GLUCAP in the last 168 hours.  Recent Results (from the past 240 hour(s))  MRSA PCR Screening     Status: None   Collection Time: 09/25/14 10:15 PM  Result Value Ref Range Status   MRSA by PCR NEGATIVE NEGATIVE Final    Comment:  The GeneXpert MRSA Assay (FDA approved for NASAL specimens only), is one component of a comprehensive MRSA colonization surveillance program. It is not intended to diagnose MRSA infection nor to guide or monitor treatment for MRSA infections.      Studies:              All Imaging reviewed and is as per above notation   Scheduled Meds: . aspirin  81 mg Oral Daily  . chlorhexidine  60 mL Topical Once  . diltiazem  240 mg Oral Daily  . docusate sodium  100 mg Oral BID  . gentamicin irrigation  80 mg Irrigation To Cath  . ketorolac  15 mg Intravenous Once  . loratadine  10 mg Oral Daily  . metoprolol succinate  25 mg Oral Daily  . off the beat book   Does not apply Once  . omega-3 acid ethyl  esters  2 g Oral Q1200  . sodium chloride  3 mL Intravenous Q12H  . vancomycin  1,000 mg Intravenous To Cath   Continuous Infusions: . sodium chloride    . sodium chloride 50 mL/hr at 09/26/14 1242     Assessment/Plan:  Principal Problem:   Sinus brady-tachy syndrome Active Problems:   Vitamin D deficiency   Essential hypertension   Osteoporosis   Dystrophic nail   Aortic insufficiency   Hyperlipidemia   Syncope   Atrial fibrillation with RVR   Solitary pulmonary nodule   Pulmonary HTN    1. Sick sinus syndrome/tachybradycardia syndrome/Afib + RVR-CHAD2Vasc2~5-defer to cardiology regarding pacemaker placement-appreciate Dr. Lovena Le discussion with family/daughter regarding risks benefits and alternatives it appears that family is willing to proceed with pacemaker. HASBLED score=2--this = moderate risk for bleeding if placed on anticoagulation-continue Cardizem 240 every 24, metoprolol 25 daily 2. Syncope-secondary to #1. Ambulate after pacemaker placement.  Ensure no other cause for syncope such as orthostasis.  Defer addition of anticoagulant to cardiology 3. Pulmonary hypertension, probably grp 2- 3 [secondary to aortic insufficiency]-she probably has mixed pulmonary hypertension secondary to chronic smoking history/COPD.  Evidence suggests keeping SaO2 between 90-92% diuresis and potential need for anticoagulation in terms of decreasing the VT E risk for right heart failure.-Probably would benefit from pulmonary input in terms of PDE 5 inhibitors. 4. Diastolic dysfunction-see above discussion. Outpatient pulmonary referral 5. Solitary pulmonary nodule-unclear if would workup at this stage. She has multiple comorbidities and she would probably not be a great candidate for any surgery 6. Osteoporosis-DEXA scan as per USPTF guidelines, continue vitamin D/calcium. 7. Hyperlipidemia-not on a statin currently. Monitor   Code Status: Full Family Communication:  Long discussion with  family Disposition Plan: Tele    Verneita Griffes, MD  [facility] Pager [phone] 09/26/2014, 1:49 PM    LOS: 1 day

## 2014-09-26 NOTE — Discharge Instructions (Signed)
° ° °  Supplemental Discharge Instructions for  Pacemaker/Defibrillator Patients  Activity No heavy lifting or vigorous activity with your left/right arm for 6 to 8 weeks.  Do not raise your left/right arm above your head for one week.  Gradually raise your affected arm as drawn below.           __      09/30/14                         10/01/14                       10/02/14                   10/03/14   WOUND CARE - Keep the wound area clean and dry.  Do not get this area wet for one week. No showers for one week; you may shower on  10/03/14   . - The tape/steri-strips on your wound will fall off; do not pull them off.  No bandage is needed on the site.  DO  NOT apply any creams, oils, or ointments to the wound area. - If you notice any drainage or discharge from the wound, any swelling or bruising at the site, or you develop a fever > 101? F after you are discharged home, call the office at once.  Special Instructions - You are still able to use cellular telephones; use the ear opposite the side where you have your pacemaker/defibrillator.  Avoid carrying your cellular phone near your device. - When traveling through airports, show security personnel your identification card to avoid being screened in the metal detectors.  Ask the security personnel to use the hand wand. - Avoid arc welding equipment, MRI testing (magnetic resonance imaging), TENS units (transcutaneous nerve stimulators).  Call the office for questions about other devices. - Avoid electrical appliances that are in poor condition or are not properly grounded. - Microwave ovens are safe to be near or to operate.

## 2014-09-26 NOTE — H&P (View-Only) (Signed)
ELECTROPHYSIOLOGY CONSULT NOTE    Patient ID: Beth [name] MRN: [id number], DOB/AGE: 04/24/1923 81 y.o.  Admit date: 09/25/2014 Date of Consult: 09/26/2014  Primary Physician: Penni Homans, MD Primary Cardiologist: [place]  Reason for Consultation: tachy-brady syndrome  HPI:  Beth [name] is a 81 y.o. female with a past medical history of hypertension, paroxysmal atrial fibrillation, syncope, and hyperlipidemia.  She has known paroxysmal atrial fibrillation and has not been felt to be a candidate for anticoagulation due to falls.  She was admitted yesterday for syncope and was found to have AF with RVR as well as up to 5 second post termination pauses with intermittent junctional rhythm.  EP has been asked to evaluate for treatment options.   Echocardiogram 06/2013 demonstrated EF 55-60%, no RWMA, moderate TR, LA 29.  Repeat echo pending this admission.  She currently denies chest pain, shortness of breath, LE edema, recent fevers, chills, nausea and vomiting.   She is complaining of persistent back pain.   Past Medical History  Diagnosis Date  . Hypertension   . Lung nodule     CT of chest 2007-left loewr lobar mass- evaluated by pulmonary- patient refused further work up  . Tobacco abuse   . Vertigo   . Abdominal pain, unspecified site 08/17/2012  . Other and unspecified hyperlipidemia 08/17/2012  . Loss of weight 01/19/2013  . Allergic state 06/22/2013  . Allergy   . Osteoporosis   . Syncope     fall  . Increased urinary frequency 01/02/2014  . Glaucoma 07/06/2014     Surgical History:  Past Surgical History  Procedure Laterality Date  . Cholecystectomy    . Cesarean section    . Hernia repair    . Abdominal hysterectomy       Prescriptions prior to admission  Medication Sig Dispense Refill Last Dose  . aspirin 81 MG chewable tablet Chew 1 tablet (81 mg total) by mouth daily. 30 tablet 11 09/25/2014 at Unknown time  . calcium-vitamin D (OSCAL WITH D 500-200)  500-200 MG-UNIT per tablet Take 1 tablet by mouth daily at 12 noon.    09/24/2014 at Unknown time  . cetirizine (ZYRTEC) 10 MG tablet Take 10 mg by mouth every morning.   09/25/2014 at Unknown time  . diltiazem (TIAZAC) 240 MG 24 hr capsule TAKE 1 CAPSULE (240 MG TOTAL) BY MOUTH DAILY. 30 capsule 6 09/25/2014 at Unknown time  . fish oil-omega-3 fatty acids 1000 MG capsule Take 2 g by mouth daily at 12 noon.    09/24/2014 at Unknown time  . metoprolol succinate (TOPROL-XL) 25 MG 24 hr tablet TAKE 1 TABLET (25 MG TOTAL) BY MOUTH DAILY. 90 tablet 1 09/25/2014 at 800  . Multiple Vitamin (MULTIVITAMIN) tablet Take 1 tablet by mouth daily at 12 noon.    Past Week at Unknown time  . Saline (SODIUM CHLORIDE) 0.65 % SOLN Place 1 spray into the nose as needed for congestion. 2 sprays each nostril once daily as needed   1 week ago  . losartan (COZAAR) 25 MG tablet Take 1 tablet (25 mg total) by mouth daily. (Patient not taking: Reported on 08/28/2014) 30 tablet 2 Not Taking at Unknown time  . mupirocin ointment (BACTROBAN) 2 % Place 1 application into the nose daily as needed. Apply to nares via clean qtip prn (Patient not taking: Reported on 09/25/2014) 22 g 0 Not Taking at Unknown time  . trimethoprim-polymyxin b (POLYTRIM) ophthalmic solution Place 2 drops into the left eye every  6 (six) hours. (Patient not taking: Reported on 09/25/2014) 10 mL 0 Completed Course at Unknown time    Inpatient Medications:  . aspirin  81 mg Oral Daily  . diltiazem  240 mg Oral Daily  . docusate sodium  100 mg Oral BID  . enoxaparin (LOVENOX) injection  40 mg Subcutaneous Q24H  . ketorolac  15 mg Intravenous Once  . loratadine  10 mg Oral Daily  . metoprolol succinate  25 mg Oral Daily  . omega-3 acid ethyl esters  2 g Oral Q1200  . sodium chloride  3 mL Intravenous Q12H    Allergies:  Allergies  Allergen Reactions  . Ciprofloxacin Other (See Comments)    myalgias  . Fexofenadine Other (See Comments)    unknown  .  Penicillins Other (See Comments)    History   Social History  . Marital Status: Widowed    Spouse Name: N/A  . Number of Children: N/A  . Years of Education: N/A   Occupational History  . Not on file.   Social History Main Topics  . Smoking status: Current Every Day Smoker -- 0.10 packs/day for 60 years    Types: Cigarettes  . Smokeless tobacco: Never Used  . Alcohol Use: No  . Drug Use: No  . Sexual Activity: No   Other Topics Concern  . Not on file   Social History Narrative     Family History  Problem Relation Age of Onset  . Heart disease Mother   . Hypertension Mother   . Diabetes Mother   . Diabetes    . Cancer      lung- in distant releatives  . Cancer Brother      Review of Systems: All other systems reviewed and are otherwise negative except as noted above.  Physical Exam: Filed Vitals:   09/25/14 1900 09/25/14 2005 09/26/14 0000 09/26/14 0400  BP: 150/72 133/82 150/61 168/59  Pulse:  102 75 77  Temp: 98.9 F (37.2 C) 98.3 F (36.8 C) 97.9 F (36.6 C) 98 F (36.7 C)  TempSrc: Oral Oral Oral Oral  Resp: 28 26 27 24   Height:      Weight:    102 lb 1.6 oz (46.312 kg)  SpO2:  93% 94% 90%    GEN- The patient is elderly appearing, alert and oriented x 3 today, extremely HOH HEENT: normocephalic, atraumatic; sclera clear, conjunctiva pink; hearing intact; oropharynx clear; neck supple, no JVP Lymph- no cervical lymphadenopathy Lungs- Clear to ausculation bilaterally, normal work of breathing.  No wheezes, rales, rhonchi Heart- Regular rate and rhythm, no murmurs, rubs or gallops  GI- soft, non-tender, non-distended, bowel sounds present  Extremities- no clubbing, cyanosis, or edema; DP/PT/radial pulses 2+ bilaterally MS- no significant deformity or atrophy Skin- warm and dry, no rash or lesion Psych- euthymic mood, full affect Neuro- strength and sensation are intact  Labs:   Lab Results  Component Value Date   WBC 7.8 09/26/2014   HGB  13.6 09/26/2014   HCT 39.8 09/26/2014   MCV 87.7 09/26/2014   PLT 242 09/26/2014    Recent Labs Lab 09/26/14 0501  NA 139  K 3.3*  CL 106  CO2 24  BUN 8  CREATININE 0.66  CALCIUM 8.9  PROT 6.6  BILITOT 0.7  ALKPHOS 64  ALT 17  AST 21  GLUCOSE 97      Radiology/Studies: Dg Chest 2 View 09/25/2014   CLINICAL DATA:  81 year old female with fall and chest pain.  EXAM: CHEST  2 VIEW  COMPARISON:  Chest radiograph dated 07/01/2014 and chest CT dated 08/12/2005 .  FINDINGS: Two views of the chest demonstrate emphysematous changes of the lungs. There is no focal consolidation. No pleural effusion or pneumothorax. Multiple bilateral pulmonary nodules similar to the prior study most compatible with metastatic disease. Left upper lobe pleural-based density similar to prior study likely corresponding to the pleural based mass seen on the CT dated 08/12/2005. The cardiomediastinal silhouette is within normal limits. There is osteopenia. Degenerative changes of the spine.  IMPRESSION: No definite acute/traumatic intrathoracic pathology.  Emphysema.  No focal consolidation or pneumothorax.  Multiple bilateral pulmonary nodules grossly similar to the prior study and concerning for metastatic disease.   Electronically Signed   By: Anner Crete M.D.   On: 09/25/2014 15:09   Ct Head Wo Contrast 09/25/2014   CLINICAL DATA:  Syncopal episode. Unknown period of loss of consciousness. Fell earlier today. Patient does not complain of headache.  EXAM: CT HEAD WITHOUT CONTRAST  TECHNIQUE: Contiguous axial images were obtained from the base of the skull through the vertex without intravenous contrast.  COMPARISON:  None.  FINDINGS: Global atrophy with small vessel disease. No acute stroke or hemorrhage. No mass lesion or extra-axial fluid. Hydrocephalus ex vacuo.  There is no visible skull fracture or worrisome osseous lesion. No significant scalp hematoma.  Moderately advanced vascular calcification. Paranasal  sinuses show no significant fluid accumulation.  IMPRESSION: Chronic changes as described.  No acute intracranial findings.   Electronically Signed   By: Staci Righter M.D.   On: 09/25/2014 15:27    GBE:EFEOF rhythm, rate 69, LAD, non specific ST-T changes  TELEMETRY: atrial fibrillation with RVR, up to 5 second post termination pauses and intermittent junctional rhythm  Assessment/Plan: 1.  Paroxysmal atrial fibrillation/tachy-brady syndrome The patient has paroxysmal atrial fibrillation with symptomatic tachy-brady syndrome.  She has had syncopal spells presumed to be related to post termination pauses.  PPM is indicated.  Risks, benefits discussed with patient by Dr Lovena Le. She would like for her daughter to be involved in the decision about placing pacemaker. We will call her this morning.  Decision about long term anticoagulation will need to be revisited for CHADS2VASC of at least 4.  She has tolerated daily ASA. Will tentatively plan pacemaker implant later this afternoon pending discussion with her daughter.   2.  HTN Stable No change required today  3. Poor hearing - she needs a battery for her hearing aid.   Signed, Chanetta Marshall, NP 09/26/2014 7:21 AM  EP Attending  Patient seen and examined. She has symptomatic tachy-brady syndrome with rapid atrial fib and long post termination pauses. I have no drug that will treat both the atrial fib and the pauses. I would recommend she undergo PPM. She is considering her options and would like to speak to her daughter. I have discussed the risks/benefits/goals/expectations of PPM insertion and she wishes to proceed if she has to.  Mikle Bosworth.D.

## 2014-09-26 NOTE — Plan of Care (Signed)
Problem: Consults Goal: Tobacco Cessation referral if indicated Outcome: Not Met (add Reason) Pt smokes 1 cigarette per day, no plans to quit

## 2014-09-26 NOTE — Progress Notes (Signed)
UR Completed Agatha Duplechain Graves-Bigelow, RN,BSN [phone]  

## 2014-09-26 NOTE — Progress Notes (Signed)
2D Echocardiogram has been performed.  Beth [name] M 09/26/2014, 11:09 AM

## 2014-09-27 ENCOUNTER — Inpatient Hospital Stay (HOSPITAL_COMMUNITY): Payer: Medicare Other

## 2014-09-27 LAB — CBC
HCT: 38.1 % (ref 36.0–46.0)
Hemoglobin: 13.2 g/dL (ref 12.0–15.0)
MCH: 29.9 pg (ref 26.0–34.0)
MCHC: 34.6 g/dL (ref 30.0–36.0)
MCV: 86.2 fL (ref 78.0–100.0)
Platelets: 197 10*3/uL (ref 150–400)
RBC: 4.42 MIL/uL (ref 3.87–5.11)
RDW: 13 % (ref 11.5–15.5)
WBC: 7.9 10*3/uL (ref 4.0–10.5)

## 2014-09-27 LAB — MAGNESIUM: Magnesium: 1.8 mg/dL (ref 1.7–2.4)

## 2014-09-27 LAB — PROTIME-INR
INR: 1.27 (ref 0.00–1.49)
PROTHROMBIN TIME: 16.1 s — AB (ref 11.6–15.2)

## 2014-09-27 LAB — COMPREHENSIVE METABOLIC PANEL
ALK PHOS: 61 U/L (ref 38–126)
ALT: 16 U/L (ref 14–54)
AST: 20 U/L (ref 15–41)
Albumin: 3.1 g/dL — ABNORMAL LOW (ref 3.5–5.0)
Anion gap: 11 (ref 5–15)
BUN: 9 mg/dL (ref 6–20)
CO2: 21 mmol/L — AB (ref 22–32)
Calcium: 8.4 mg/dL — ABNORMAL LOW (ref 8.9–10.3)
Chloride: 103 mmol/L (ref 101–111)
Creatinine, Ser: 0.55 mg/dL (ref 0.44–1.00)
GFR calc Af Amer: >60 mL/min (ref >60)
GFR calc non Af Amer: >60 mL/min (ref >60)
GLUCOSE: 135 mg/dL — AB (ref 65–99)
POTASSIUM: 2.9 mmol/L — AB (ref 3.5–5.1)
Sodium: 135 mmol/L (ref 135–145)
TOTAL PROTEIN: 6.3 g/dL — AB (ref 6.5–8.1)
Total Bilirubin: 0.8 mg/dL (ref 0.3–1.2)

## 2014-09-27 MED ORDER — POTASSIUM CHLORIDE ER 10 MEQ PO TBCR
40.0000 meq | EXTENDED_RELEASE_TABLET | Freq: Two times a day (BID) | ORAL | Status: DC
  Administered 2014-09-28: 40 meq via ORAL
  Filled 2014-09-27 (×6): qty 4

## 2014-09-27 MED ORDER — POTASSIUM CHLORIDE ER 10 MEQ PO TBCR
40.0000 meq | EXTENDED_RELEASE_TABLET | Freq: Once | ORAL | Status: AC
  Administered 2014-09-27: 40 meq via ORAL
  Filled 2014-09-27 (×2): qty 4

## 2014-09-27 MED ORDER — POTASSIUM CHLORIDE CRYS ER 20 MEQ PO TBCR
40.0000 meq | EXTENDED_RELEASE_TABLET | ORAL | Status: AC
  Administered 2014-09-27 (×2): 40 meq via ORAL
  Filled 2014-09-27 (×2): qty 2

## 2014-09-27 MED ORDER — FLECAINIDE ACETATE 50 MG PO TABS: 50.0000 mg | ORAL_TABLET | Freq: Two times a day (BID) | ORAL | Status: DC

## 2014-09-27 MED ORDER — OXYCODONE-ACETAMINOPHEN 5-325 MG PO TABS: 1.5000 | ORAL_TABLET | ORAL | Status: DC | PRN

## 2014-09-27 MED ORDER — POTASSIUM CHLORIDE CRYS ER 20 MEQ PO TBCR: 40.0000 meq | EXTENDED_RELEASE_TABLET | Freq: Two times a day (BID) | ORAL | Status: DC

## 2014-09-27 MED ORDER — OXYCODONE-ACETAMINOPHEN 7.5-325 MG PO TABS: 1.0000 | ORAL_TABLET | ORAL | Status: DC | PRN

## 2014-09-27 MED ORDER — DICLOFENAC EPOLAMINE 1.3 % TD PTCH
1.0000 | MEDICATED_PATCH | Freq: Two times a day (BID) | TRANSDERMAL | Status: DC
  Administered 2014-09-27 – 2014-09-30 (×7): 1 via TRANSDERMAL
  Filled 2014-09-27 (×9): qty 1

## 2014-09-27 NOTE — Evaluation (Signed)
Physical Therapy Evaluation Patient Details Name: Beth [name] MRN: [id number] DOB: 03/06/1924 Today's Date: 09/27/2014   History of Present Illness  Patient admitted with Sinus brady-tachy syndrome and underwent pacemaker placement.  PMH significant for osteoporosis, syncopy and HTN.  Clinical Impression  Patient requiring min assistance for mobility and thus do not feel she can go home alone today - discussed with MD.  Patient motivated to return home and will benefit from PT to progress mobility and independence.  Feel it would be best if patient has 24 hour assistance at home for 1-2 days.      Follow Up Recommendations Home health PT;Supervision/Assistance - 24 hour (24 hour supervision x 1-2 days)    Equipment Recommendations  None recommended by PT    Recommendations for Other Services       Precautions / Restrictions Precautions Precautions: Fall Restrictions Weight Bearing Restrictions: No      Mobility  Bed Mobility Overal bed mobility: Needs Assistance Bed Mobility: Sit to Supine       Sit to supine: Min assist   General bed mobility comments: for LE's, able to reposition self in bed independently  Transfers Overall transfer level: Needs assistance Equipment used: Rolling walker (2 wheeled) Transfers: Sit to/from Stand Sit to Stand: Min assist         General transfer comment: needed assist to power up  Ambulation/Gait Ambulation/Gait assistance: Min assist Ambulation Distance (Feet): 15 Feet Assistive device: Rolling walker (2 wheeled) Gait Pattern/deviations: Step-to pattern;Leaning posteriorly;Decreased weight shift to right Gait velocity: decreased   General Gait Details: had some difficulty advancing right LE due to pain in back  Stairs            Wheelchair Mobility    Modified Rankin (Stroke Patients Only)       Balance Overall balance assessment: Needs assistance Sitting-balance support: No upper extremity  supported Sitting balance-Leahy Scale: Good     Standing balance support: Bilateral upper extremity supported Standing balance-Leahy Scale: Poor Standing balance comment: needed walker and assistance for standing                             Pertinent Vitals/Pain Pain Assessment: 0-10 Pain Score: 5  Pain Location: back Pain Descriptors / Indicators: Grimacing;Jabbing Pain Intervention(s): Limited activity within patient's tolerance;Monitored during session;Premedicated before session    Home Living Family/patient expects to be discharged to:: Private residence Living Arrangements: Alone Available Help at Discharge: Family;Available PRN/intermittently (daughter checks on patient via phone 2x/day) Type of Home: Apartment Home Access: Level entry Entrance Stairs-Rails: Right   Home Layout: One level Home Equipment: Walker - 2 wheels;Cane - single point      Prior Function Level of Independence: Independent with assistive device(s)               Hand Dominance        Extremity/Trunk Assessment   Upper Extremity Assessment: Generalized weakness           Lower Extremity Assessment: Generalized weakness      Cervical / Trunk Assessment: Kyphotic  Communication   Communication: No difficulties;HOH  Cognition Arousal/Alertness: Awake/alert Behavior During Therapy: WFL for tasks assessed/performed Overall Cognitive Status: Within Functional Limits for tasks assessed                      General Comments      Exercises        Assessment/Plan  PT Assessment Patient needs continued PT services  PT Diagnosis Generalized weakness   PT Problem List Decreased activity tolerance;Decreased balance;Decreased mobility;Pain  PT Treatment Interventions Gait training;Functional mobility training;Balance training;Therapeutic exercise;Therapeutic activities;Patient/family education   PT Goals (Current goals can be found in the Care Plan  section) Acute Rehab PT Goals Patient Stated Goal: go home PT Goal Formulation: With patient Time For Goal Achievement: 10/11/14 Potential to Achieve Goals: Good    Frequency Min 4X/week   Barriers to discharge Decreased caregiver support may be difficult for her to d/c home alone, may need assistance at first    Co-evaluation               End of Session Equipment Utilized During Treatment: Gait belt Activity Tolerance: Patient limited by pain;Patient limited by fatigue Patient left: in bed;with call bell/phone within reach;with bed alarm set Nurse Communication: Mobility status         Time: 8177-1165 PT Time Calculation (min) (ACUTE ONLY): 22 min   Charges:   PT Evaluation $Initial PT Evaluation Tier I: 1 Procedure     PT G CodesShanna Cisco 09/27/2014, 1:34 PM  09/27/2014 [place], [place]

## 2014-09-27 NOTE — Progress Notes (Signed)
HR 110s-130s, A-fib on telemetry.  K+ is 2.9 today.  Dr. Percival Spanish notified.  Orders received.  Metoprolol XL given early, and KCl given per MD orders.  Will continue to monitor.  [name]

## 2014-09-27 NOTE — Progress Notes (Signed)
Patient ID: Beth [name], female   DOB: 29-Nov-1923, 81 y.o.   MRN: [id number]    SUBJECTIVE: The patient is doing well today despite going back into atrial fib with a RVR.  She is asymptomatic  . aspirin  81 mg Oral Daily  . diltiazem  240 mg Oral Daily  . docusate sodium  100 mg Oral BID  . flecainide  50 mg Oral Q12H  . ketorolac  15 mg Intravenous Once  . loratadine  10 mg Oral Daily  . metoprolol succinate  25 mg Oral Daily  . omega-3 acid ethyl esters  2 g Oral Q1200  . sodium chloride  3 mL Intravenous Q12H   . sodium chloride      OBJECTIVE: Physical Exam: Filed Vitals:   09/27/14 0000 09/27/14 0400 09/27/14 0633 09/27/14 0813  BP: 163/66 147/60 166/77 126/78  Pulse: 79 79 127 121  Temp: 98.4 F (36.9 C) 98.5 F (36.9 C)  97.7 F (36.5 C)  TempSrc: Oral Oral  Oral  Resp: 27 22  25   Height:      Weight:  110 lb 12.8 oz (50.259 kg)    SpO2: 92% 92%  94%    Intake/Output Summary (Last 24 hours) at 09/27/14 0827 Last data filed at 09/27/14 0700  Gross per 24 hour  Intake    440 ml  Output   2100 ml  Net  -1660 ml    Telemetry reveals sinus rhythm  GEN- The patient is well appearing, alert and oriented x 3 today.   Head- normocephalic, atraumatic Eyes-  Sclera clear, conjunctiva pink Ears - very poor hearing Oropharynx- clear Neck- supple, 7 cm JVP Lungs- Clear to ausculation bilaterally, normal work of breathing, pm incision without hematoma Heart- IRegular rate and rhythm, no murmurs, rubs or gallops, PMI not laterally displaced GI- soft, NT, ND, + BS Extremities- no clubbing, cyanosis, or edema Skin- no rash or lesion Psych- euthymic mood, full affect Neuro- strength and sensation are intact  LABS: Basic Metabolic Panel:  Recent Labs  09/26/14 0501 09/27/14 0425  NA 139 135  K 3.3* 2.9*  CL 106 103  CO2 24 21*  GLUCOSE 97 135*  BUN 8 9  CREATININE 0.66 0.55  CALCIUM 8.9 8.4*  MG  --  1.8   Liver Function Tests:  Recent Labs  09/26/14 0501 09/27/14 0425  AST 21 20  ALT 17 16  ALKPHOS 64 61  BILITOT 0.7 0.8  PROT 6.6 6.3*  ALBUMIN 3.4* 3.1*   No results for input(s): LIPASE, AMYLASE in the last 72 hours. CBC:  Recent Labs  09/25/14 1440 09/26/14 0501 09/27/14 0425  WBC 10.2 7.8 7.9  NEUTROABS 8.9*  --   --   HGB 14.1 13.6 13.2  HCT 40.4 39.8 38.1  MCV 86.7 87.7 86.2  PLT 259 242 197   Cardiac Enzymes:  Recent Labs  09/25/14 1440 09/25/14 1748 09/25/14 2234 09/26/14 0501  CKTOTAL 85  --   --   --   TROPONINI  --  <0.03 <0.03 <0.03   BNP: Invalid input(s): POCBNP D-Dimer: No results for input(s): DDIMER in the last 72 hours. Hemoglobin A1C: No results for input(s): HGBA1C in the last 72 hours. Fasting Lipid Panel: No results for input(s): CHOL, HDL, LDLCALC, TRIG, CHOLHDL, LDLDIRECT in the last 72 hours. Thyroid Function Tests: No results for input(s): TSH, T4TOTAL, T3FREE, THYROIDAB in the last 72 hours.  Invalid input(s): FREET3 Anemia Panel: No results for input(s): VITAMINB12, FOLATE, FERRITIN,  TIBC, IRON, RETICCTPCT in the last 72 hours.  RADIOLOGY: Dg Chest 2 View  09/25/2014   CLINICAL DATA:  81 year old female with fall and chest pain.  EXAM: CHEST  2 VIEW  COMPARISON:  Chest radiograph dated 07/01/2014 and chest CT dated 08/12/2005 .  FINDINGS: Two views of the chest demonstrate emphysematous changes of the lungs. There is no focal consolidation. No pleural effusion or pneumothorax. Multiple bilateral pulmonary nodules similar to the prior study most compatible with metastatic disease. Left upper lobe pleural-based density similar to prior study likely corresponding to the pleural based mass seen on the CT dated 08/12/2005. The cardiomediastinal silhouette is within normal limits. There is osteopenia. Degenerative changes of the spine.  IMPRESSION: No definite acute/traumatic intrathoracic pathology.  Emphysema.  No focal consolidation or pneumothorax.  Multiple bilateral  pulmonary nodules grossly similar to the prior study and concerning for metastatic disease.   Electronically Signed   By: Anner Crete M.D.   On: 09/25/2014 15:09   Dg Lumbar Spine Complete  09/25/2014   CLINICAL DATA:  81 year old female with a history of fall and lower back pain  EXAM: LUMBAR SPINE - COMPLETE 4+ VIEW  COMPARISON:  06/28/2013, 08/07/2009  FINDINGS: Apex right scoliotic curvature of the lumbar spine, centered at L3. There is associated multilevel degenerative disc disease and facet disease.  Diffuse osteopenia.  Relative maintenance of the anatomic alignment on the lateral view. There is mild retrolisthesis of L1 on L2 and L2 on L3, as well as mild anterolisthesis of L4 on L5. This may be projectional given the scoliotic curvature.  Vertebral body heights maintained, similar to the comparison.  No fracture line identified.  Dense atherosclerotic calcifications of the abdomen and pelvis.  IMPRESSION: No radiographic evidence of acute fracture or malalignment of the lumbar spine, however, osteopenia limits evaluation for nondisplaced fractures, and if there is ongoing concern for acute injury, MRI may be considered as a more sensitive test.  Scoliotic curvature with associated degenerative changes.  Atherosclerosis.  Signed,  Dulcy Fanny. Earleen Newport, DO  [facility]  [facility]   Electronically Signed   By: Corrie Mckusick D.O.   On: 09/25/2014 15:05   Ct Head Wo Contrast  09/25/2014   CLINICAL DATA:  Syncopal episode. Unknown period of loss of consciousness. Fell earlier today. Patient does not complain of headache.  EXAM: CT HEAD WITHOUT CONTRAST  TECHNIQUE: Contiguous axial images were obtained from the base of the skull through the vertex without intravenous contrast.  COMPARISON:  None.  FINDINGS: Global atrophy with small vessel disease. No acute stroke or hemorrhage. No mass lesion or extra-axial fluid. Hydrocephalus ex vacuo.  There is no visible  skull fracture or worrisome osseous lesion. No significant scalp hematoma.  Moderately advanced vascular calcification. Paranasal sinuses show no significant fluid accumulation.  IMPRESSION: Chronic changes as described.  No acute intracranial findings.   Electronically Signed   By: Staci Righter M.D.   On: 09/25/2014 15:27    ASSESSMENT AND PLAN:  Principal Problem:   Sinus brady-tachy syndrome Active Problems:   Vitamin D deficiency   Essential hypertension   Osteoporosis   Dystrophic nail   Aortic insufficiency   Hyperlipidemia   Syncope   Atrial fibrillation with RVR   Solitary pulmonary nodule   Pulmonary HTN hypokalemia  Rec: continue her current meds. As she is out of rhythm and currently going fast, would consider delaying discharge until she either slows down or goes back to  NSR. Her PPM is working normally. Will plan usual PM followup.    Cristopher Peru, MD 09/27/2014 8:27 AM

## 2014-09-27 NOTE — Significant Event (Addendum)
Patient not d/c as significant LBP making mobility difficult PT/Ot to reassess ? OXY IR-->PErcocet  I have added Flector patch in an effort to see if this relieves her MId thoracic upper lumbar bck She will probably need thoraco-lumbar MRI if pain is still severe 09/28/14 am  Verneita Griffes, MD [facility] [phone]

## 2014-09-27 NOTE — Progress Notes (Addendum)
Just converted to NSR with HR 80s, was in a-fib with RVR when seen by Dr. Lovena Le. Discussed with Dr. Lovena Le on the phone. [place] for discharge. Continue current rate control medication and flecainide. I will arrange followup.  Hilbert Corrigan PA Pager: [phone]  Discussed with Dr. Lovena Le, patient is not a good candidate for systemic anticoagulation, will continue ASA for now

## 2014-09-27 NOTE — Discharge Summary (Signed)
Physician Discharge Summary  Beth [name] VPX:[id number] DOB: 10-30-1923 DOA: 09/25/2014  PCP: Penni Homans, MD  Admit date: 09/25/2014 Discharge date: 09/27/2014  Time spent: 40 minutes  Recommendations for Outpatient Follow-up:  1. Needs OP follow up with Cardiologist Dr. Lovena Le as per his rec's 2.  to meds as below   Don't Resume on Discharge  -  -calcium-vitamin D (OSCAL WITH D 500-200) 500-200 MG-UNIT per tablet - losartan (COZAAR) 25 MG tablet - mupirocin ointment (BACTROBAN) 2 %  Order on Discharge  - potassium chloride SA (K-DUR,KLOR-CON) 20 MEQ tablet  RESUME ON D/C -aspirin 81 MG chewable tablet 3. Needs attention to Lower back pain-consider repeat Xrays or Ct if persisting pain 4. Consider OP pulmonary eval for Pulm Htn-suspect not really a candidate for PDE-5 inhibitors but might benefit from diuresis as oxygen when followed up by PCP   Discharge Diagnoses:  Principal Problem:   Sinus brady-tachy syndrome Active Problems:   Vitamin D deficiency   Essential hypertension   Osteoporosis   Dystrophic nail   Aortic insufficiency   Hyperlipidemia   Syncope   Atrial fibrillation with RVR   Solitary pulmonary nodule   Pulmonary HTN   Discharge Condition: stable  Diet recommendation:  hh low salt  Filed Weights   09/25/14 1351 09/26/14 0400 09/27/14 0400  Weight: 48.988 kg (108 lb) 46.312 kg (102 lb 1.6 oz) 50.259 kg (110 lb 12.8 oz)    History of present illness:  Brief narrative:  81 y/o ? prior Afib foll chr by Dr. Acie Fredrickson 7.2 % stroke rate/year from a score of 5-not on AC 2/2 to prohibitive fall risks,  solitary pulmonary nodule, HTN, vertigo, multiple falls felt secondary to atrial fibrillation, presbycusis, osteoporosis per DEXA scan 2012, prior rib fractures after falls, severe protein energy malnutrition, aortic insufficiency, pulmonary hypertension based on echocardiogram 09/26/14-PA peak 66 mmHg admitted 09/25/14 with a syncopal event resulting in a  fall found to be in atrial fibrillation with RVR rates in the low 200s/180s given IV Cardizem and had converted to sinus rhythm at the time of initial encounter See below  Hospital Course:  Assessment/Plan:  Principal Problem:  Sinus brady-tachy syndrome Active Problems:  Vitamin D deficiency  Essential hypertension  Osteoporosis  Dystrophic nail  Aortic insufficiency  Hyperlipidemia  Syncope  Atrial fibrillation with RVR  Solitary pulmonary nodule  Pulmonary HTN    1. Sick sinus syndrome/tachybradycardia syndrome/Afib + RVR-CHAD2Vasc2~5-per cardiology had MEd-tronic pacemaker placement-appreciate Dr. Lovena Le discussion with family/daughter regarding risks benefits and alternatives it appears that family is willing to proceed with pacemaker. HASBLED score=2--this = moderate risk for bleeding if placed on anticoagulation-continue Cardizem 240 every 24, metoprolol 25 daily--Consider OP re-discussion re: AC vs only asa 81 mg per Dr. Lovena Le 2. Syncope-secondary to #1. Ambulate after pacemaker placement. Ensure no other cause for syncope such as orthostasis. Defer addition of anticoagulant to cardiology as OP 3. Pulmonary hypertension, probably grp 2- 3 [secondary to aortic insufficiency]-she probably has mixed pulmonary hypertension secondary to chronic smoking history/COPD. Evidence suggests keeping SaO2 between 90-92% diuresis and potential need for anticoagulation in terms of decreasing the VT E risk for right heart failure.-Probably would benefit from pulmonary input in terms of PDE 5 inhibitors.  4. Diastolic dysfunction-see above discussion. Outpatient pulmonary referral 5. Solitary pulmonary nodule-unclear if would workup at this stage. She has multiple comorbidities and she would probably not be a great candidate for any surgery 6. Osteoporosis-DEXA scan as per USPTF guidelines, continue vitamin D/calcium. 7. Hyperlipidemia-not on  a statin currently.  Monitor  Consultants: Cardiology Electrophysiology  Procedures:  Echocardiogram  Pacemaker placement  Antibiotics:  None  Discharge Exam: Filed Vitals:   09/27/14 0813  BP: 126/78  Pulse: 121  Temp: 97.7 F (36.5 C)  Resp: 25    General: eommi, ncat Cardiovascular: s1 s2 no m/r/g-Chest site is a little tender Respiratory:  clear  Discharge Instructions   Discharge Instructions    Diet - low sodium heart healthy    Complete by:  As directed      Discharge instructions    Complete by:  As directed   Look at your medication list carefully for discharge home-there are some changes to your medicine4s See cardiology-they will set-up a follow up Get lab work within 1 week at PCP office     Increase activity slowly    Complete by:  As directed           Current Discharge Medication List    START taking these medications   Details  potassium chloride SA (K-DUR,KLOR-CON) 20 MEQ tablet Take 2 tablets (40 mEq total) by mouth 2 (two) times daily. Qty: 30 tablet, Refills: 0      CONTINUE these medications which have NOT CHANGED   Details  cetirizine (ZYRTEC) 10 MG tablet Take 10 mg by mouth every morning.    diltiazem (TIAZAC) 240 MG 24 hr capsule TAKE 1 CAPSULE (240 MG TOTAL) BY MOUTH DAILY. Qty: 30 capsule, Refills: 6    fish oil-omega-3 fatty acids 1000 MG capsule Take 2 g by mouth daily at 12 noon.     metoprolol succinate (TOPROL-XL) 25 MG 24 hr tablet TAKE 1 TABLET (25 MG TOTAL) BY MOUTH DAILY. Qty: 90 tablet, Refills: 1    Multiple Vitamin (MULTIVITAMIN) tablet Take 1 tablet by mouth daily at 12 noon.     Saline (SODIUM CHLORIDE) 0.65 % SOLN Place 1 spray into the nose as needed for congestion. 2 sprays each nostril once daily as needed    trimethoprim-polymyxin b (POLYTRIM) ophthalmic solution Place 2 drops into the left eye every 6 (six) hours. Qty: 10 mL, Refills: 0      STOP taking these medications     aspirin 81 MG chewable tablet       calcium-vitamin D (OSCAL WITH D 500-200) 500-200 MG-UNIT per tablet      losartan (COZAAR) 25 MG tablet      mupirocin ointment (BACTROBAN) 2 %        Allergies  Allergen Reactions  . Ciprofloxacin Other (See Comments)    myalgias  . Fexofenadine Other (See Comments)    unknown  . Penicillins Other (See Comments)   Follow-up Information    Follow up with [facility] On 10/06/2014.   Why:  at [facility] for wound check   Contact information:   [address] [place] [address]       Follow up with Cristopher Peru, MD.   Specialty:  Cardiology   Why:  Office scheduler will contact you to arrange followup, please give [place] a call if you do not hear from [place] in 2 business days   Contact information:   [address] [place] [address] [phone]        The results of significant diagnostics from this hospitalization (including imaging, microbiology, ancillary and laboratory) are listed below for reference.    Significant Diagnostic Studies: Dg Chest 2 View  09/27/2014   CLINICAL DATA:  Back  pain.  Fall 2 days ago.  Pacemaker.  EXAM: CHEST - 2 VIEW  COMPARISON:  Two-view chest x-ray 09/25/2014 and 05/19/2008.  FINDINGS: A dual-chamber pacemaker has been placed. The leads terminate in the right atrium and right ventricle. There is no pneumothorax. The heart size is normal. Atherosclerotic changes are noted in the aorta.  Emphysematous changes of lungs are again seen. Left-sided nodules are present. Pleural thickening at the left apex is stable. A granuloma at the right base is stable. No acute superimposed disease is present. Moderate osteopenia is present. The visualized soft tissues and bony thorax are otherwise unremarkable.  IMPRESSION: 1. Interval placement of dual-chamber pacemaker without radiographic evidence for complication. 2. Severe emphysema without significant change. 3. Stable bilateral pulmonary nodules and left greater than  right apical pleural thickening.   Electronically Signed   By: San Morelle M.D.   On: 09/27/2014 11:46   Dg Chest 2 View  09/25/2014   CLINICAL DATA:  81 year old female with fall and chest pain.  EXAM: CHEST  2 VIEW  COMPARISON:  Chest radiograph dated 07/01/2014 and chest CT dated 08/12/2005 .  FINDINGS: Two views of the chest demonstrate emphysematous changes of the lungs. There is no focal consolidation. No pleural effusion or pneumothorax. Multiple bilateral pulmonary nodules similar to the prior study most compatible with metastatic disease. Left upper lobe pleural-based density similar to prior study likely corresponding to the pleural based mass seen on the CT dated 08/12/2005. The cardiomediastinal silhouette is within normal limits. There is osteopenia. Degenerative changes of the spine.  IMPRESSION: No definite acute/traumatic intrathoracic pathology.  Emphysema.  No focal consolidation or pneumothorax.  Multiple bilateral pulmonary nodules grossly similar to the prior study and concerning for metastatic disease.   Electronically Signed   By: Anner Crete M.D.   On: 09/25/2014 15:09   Dg Lumbar Spine Complete  09/25/2014   CLINICAL DATA:  81 year old female with a history of fall and lower back pain  EXAM: LUMBAR SPINE - COMPLETE 4+ VIEW  COMPARISON:  06/28/2013, 08/07/2009  FINDINGS: Apex right scoliotic curvature of the lumbar spine, centered at L3. There is associated multilevel degenerative disc disease and facet disease.  Diffuse osteopenia.  Relative maintenance of the anatomic alignment on the lateral view. There is mild retrolisthesis of L1 on L2 and L2 on L3, as well as mild anterolisthesis of L4 on L5. This may be projectional given the scoliotic curvature.  Vertebral body heights maintained, similar to the comparison.  No fracture line identified.  Dense atherosclerotic calcifications of the abdomen and pelvis.  IMPRESSION: No radiographic evidence of acute fracture or  malalignment of the lumbar spine, however, osteopenia limits evaluation for nondisplaced fractures, and if there is ongoing concern for acute injury, MRI may be considered as a more sensitive test.  Scoliotic curvature with associated degenerative changes.  Atherosclerosis.  Signed,  Dulcy Fanny. Earleen Newport, DO  [facility]  [facility]   Electronically Signed   By: Corrie Mckusick D.O.   On: 09/25/2014 15:05   Ct Head Wo Contrast  09/25/2014   CLINICAL DATA:  Syncopal episode. Unknown period of loss of consciousness. Fell earlier today. Patient does not complain of headache.  EXAM: CT HEAD WITHOUT CONTRAST  TECHNIQUE: Contiguous axial images were obtained from the base of the skull through the vertex without intravenous contrast.  COMPARISON:  None.  FINDINGS: Global atrophy with small vessel disease. No acute stroke or hemorrhage. No mass lesion or extra-axial  fluid. Hydrocephalus ex vacuo.  There is no visible skull fracture or worrisome osseous lesion. No significant scalp hematoma.  Moderately advanced vascular calcification. Paranasal sinuses show no significant fluid accumulation.  IMPRESSION: Chronic changes as described.  No acute intracranial findings.   Electronically Signed   By: Staci Righter M.D.   On: 09/25/2014 15:27    Microbiology: Recent Results (from the past 240 hour(s))  MRSA PCR Screening     Status: None   Collection Time: 09/25/14 10:15 PM  Result Value Ref Range Status   MRSA by PCR NEGATIVE NEGATIVE Final    Comment:        The GeneXpert MRSA Assay (FDA approved for NASAL specimens only), is one component of a comprehensive MRSA colonization surveillance program. It is not intended to diagnose MRSA infection nor to guide or monitor treatment for MRSA infections.      Labs: Basic Metabolic Panel:  Recent Labs Lab 09/25/14 1440 09/26/14 0501 09/27/14 0425  NA 142 139 135  K 3.7 3.3* 2.9*  CL 111 106 103  CO2 23 24 21*    GLUCOSE 130* 97 135*  BUN 8 8 9   CREATININE 0.71 0.66 0.55  CALCIUM 9.0 8.9 8.4*  MG  --   --  1.8   Liver Function Tests:  Recent Labs Lab 09/25/14 1440 09/26/14 0501 09/27/14 0425  AST 27 21 20   ALT 19 17 16   ALKPHOS 63 64 61  BILITOT 0.6 0.7 0.8  PROT 6.8 6.6 6.3*  ALBUMIN 3.6 3.4* 3.1*   No results for input(s): LIPASE, AMYLASE in the last 168 hours. No results for input(s): AMMONIA in the last 168 hours. CBC:  Recent Labs Lab 09/25/14 1440 09/26/14 0501 09/27/14 0425  WBC 10.2 7.8 7.9  NEUTROABS 8.9*  --   --   HGB 14.1 13.6 13.2  HCT 40.4 39.8 38.1  MCV 86.7 87.7 86.2  PLT 259 242 197   Cardiac Enzymes:  Recent Labs Lab 09/25/14 1440 09/25/14 1748 09/25/14 2234 09/26/14 0501  CKTOTAL 85  --   --   --   TROPONINI  --  <0.03 <0.03 <0.03   BNP: BNP (last 3 results) No results for input(s): BNP in the last 8760 hours.  ProBNP (last 3 results) No results for input(s): PROBNP in the last 8760 hours.  CBG: No results for input(s): GLUCAP in the last 168 hours.     SignedNita Sells  [facility] 09/27/2014, 12:07 PM

## 2014-09-28 DIAGNOSIS — I5022 Chronic systolic (congestive) heart failure: Secondary | ICD-10-CM

## 2014-09-28 DIAGNOSIS — M549 Dorsalgia, unspecified: Secondary | ICD-10-CM | POA: Insufficient documentation

## 2014-09-28 LAB — BASIC METABOLIC PANEL
Anion gap: 6 (ref 5–15)
BUN: 22 mg/dL — AB (ref 6–20)
CALCIUM: 8.7 mg/dL — AB (ref 8.9–10.3)
CO2: 21 mmol/L — AB (ref 22–32)
CREATININE: 1 mg/dL (ref 0.44–1.00)
Chloride: 103 mmol/L (ref 101–111)
GFR calc Af Amer: 57 mL/min — ABNORMAL LOW (ref >60)
GFR, EST NON AFRICAN AMERICAN: 49 mL/min — AB (ref >60)
Glucose, Bld: 130 mg/dL — ABNORMAL HIGH (ref 65–99)
POTASSIUM: 5.4 mmol/L — AB (ref 3.5–5.1)
SODIUM: 130 mmol/L — AB (ref 135–145)

## 2014-09-28 LAB — CBC
HEMATOCRIT: 38 % (ref 36.0–46.0)
Hemoglobin: 13 g/dL (ref 12.0–15.0)
MCH: 29.8 pg (ref 26.0–34.0)
MCHC: 34.2 g/dL (ref 30.0–36.0)
MCV: 87.2 fL (ref 78.0–100.0)
Platelets: 223 10*3/uL (ref 150–400)
RBC: 4.36 MIL/uL (ref 3.87–5.11)
RDW: 13.4 % (ref 11.5–15.5)
WBC: 8.7 10*3/uL (ref 4.0–10.5)

## 2014-09-28 MED ORDER — OXYCODONE-ACETAMINOPHEN 5-325 MG PO TABS
1.0000 | ORAL_TABLET | Freq: Three times a day (TID) | ORAL | Status: DC
  Administered 2014-09-28 – 2014-09-30 (×7): 1 via ORAL
  Filled 2014-09-28 (×8): qty 1

## 2014-09-28 MED ORDER — LOSARTAN POTASSIUM 25 MG PO TABS
25.0000 mg | ORAL_TABLET | Freq: Every day | ORAL | Status: DC
Start: 2014-09-28 — End: 2014-09-30
  Administered 2014-09-28 – 2014-09-30 (×2): 25 mg via ORAL
  Filled 2014-09-28 (×2): qty 1

## 2014-09-28 NOTE — Progress Notes (Addendum)
[facility] Progress Note   Beth [name] SJG:[id number] DOB: 01/21/1924 DOA: 09/25/2014 PCP: Penni Homans, MD  Brief narrative: Beth [name] is a 81 y.o. female with A. fib, pulmonary nodules not wanting further workup, hypertension, multiple falls who presented to the hospital with syncopal episode and was found to have A. fib with RVR. She was having paroxysmal atrial fibrillation and was determined to have tachybradycardia syndrome. She has had more than one syncopal episode at home. Pacemaker was discussed with her and placed on 09/27/14. Long-term anticoagulation for A. fib not initiated due to multiple falls. She was having mid back pain yesterday and therefore not discharged home after pacemaker placement.   Subjective: Continues to have mid back pain which she states is severe when she gets up to move.  Assessment/Plan: Principal Problem:   Sinus brady-tachy syndrome/syncope - Status post pacemaker on 7/2  Active Problems: A. fib-paroxysmal -Currently not a candidate for anticoagulation however if frequent falls resolve, she should be considered for anticoagulation -Continue baby aspirin, Toprol, diltiazem, flecainide  Mid back pain -Chest x-ray does not reveal any thoracic abnormalities, specifically compression fractures -Unable to do MRI due to pacemaker -Continue pain control for now with Flector patch and 4 times a day Percocet -We'll likely need to go to rehabilitation if pain does not improve, and she does not become more mobile on her own as she does live alone - have requested a social work consult to help find a rehabilitation facility    Essential hypertension -Continue metoprolol and diltiazem -We'll resume losartan today-it is on her medication list but states that she was not taking it at home    Osteoporosis -Continue calcium and vitamin D  Pulmonary nodules -She does not want workup for this which is reasonable  Chronic systolic heart  failure -EF on 2-D echo from 7/1 found to be 45-50% with diffuse hypokinesis -Not requiring diuretics -Resume ARB-not sure why she was not taking this at home-no history of renal failure  Hypokalemia -Continue to replace aggressively-has 40 mg twice a day as a standing order -Magnesium level pending    Code Status: DO NOT RESUSCITATE Family Communication:  Disposition Plan: Home versus skilled nursing facility depending on back pain DVT prophylaxis: Start Lovenox Consultants: Cardiology/EP Procedures: 2-D echo  Antibiotics: Anti-infectives    Start     Dose/Rate Route Frequency Ordered Stop   09/27/14 0200  vancomycin (VANCOCIN) IVPB 1000 mg/200 mL premix     1,000 mg 200 mL/hr over 60 Minutes Intravenous Every 12 hours 09/26/14 1723 09/27/14 0321   09/26/14 1549  vancomycin (VANCOCIN) 1,000 mg in sodium chloride 0.9 % 250 mL IVPB  Status:  Discontinued     1,000 mg over 60 Minutes  As needed 09/26/14 1549 09/26/14 1700   09/26/14 1000  gentamicin (GARAMYCIN) 80 mg in sodium chloride irrigation 0.9 % 500 mL irrigation     80 mg Irrigation To Cath Lab 09/26/14 0934 09/26/14 1631   09/26/14 1000  vancomycin (VANCOCIN) IVPB 1000 mg/200 mL premix  Status:  Discontinued     1,000 mg 200 mL/hr over 60 Minutes Intravenous To Cath Lab 09/26/14 0934 09/26/14 1705      Objective: Filed Weights   09/26/14 0400 09/27/14 0400 09/28/14 0400  Weight: 46.312 kg (102 lb 1.6 oz) 50.259 kg (110 lb 12.8 oz) 47.356 kg (104 lb 6.4 oz)    Intake/Output Summary (Last 24 hours) at 09/28/14 1144 Last data filed at 09/28/14 0730  Gross per 24 hour  Intake     60 ml  Output    350 ml  Net   -290 ml     Vitals Filed Vitals:   09/27/14 2100 09/28/14 0019 09/28/14 0400 09/28/14 0858  BP: 142/53 130/54 152/52 131/51  Pulse: 59 60 70   Temp: 97.8 F (36.6 C) 97.9 F (36.6 C) 97.5 F (36.4 C)   TempSrc: Oral Oral Oral   Resp:      Height:      Weight:   47.356 kg (104 lb 6.4 oz)   SpO2:  97% 96% 94%     Exam:  General:  Pt is alert, not in acute distress  HEENT: No icterus, No thrush, oral mucosa moist  Cardiovascular: regular rate and rhythm, S1/S2 No murmur  Respiratory: clear to auscultation bilaterally   Abdomen: Soft, +Bowel sounds, non tender, non distended, no guarding  MSK: No LE edema, cyanosis or clubbing  Data Reviewed: Basic Metabolic Panel:  Recent Labs Lab 09/25/14 1440 09/26/14 0501 09/27/14 0425  NA 142 139 135  K 3.7 3.3* 2.9*  CL 111 106 103  CO2 23 24 21*  GLUCOSE 130* 97 135*  BUN 8 8 9   CREATININE 0.71 0.66 0.55  CALCIUM 9.0 8.9 8.4*  MG  --   --  1.8   Liver Function Tests:  Recent Labs Lab 09/25/14 1440 09/26/14 0501 09/27/14 0425  AST 27 21 20   ALT 19 17 16   ALKPHOS 63 64 61  BILITOT 0.6 0.7 0.8  PROT 6.8 6.6 6.3*  ALBUMIN 3.6 3.4* 3.1*   No results for input(s): LIPASE, AMYLASE in the last 168 hours. No results for input(s): AMMONIA in the last 168 hours. CBC:  Recent Labs Lab 09/25/14 1440 09/26/14 0501 09/27/14 0425 09/28/14 0355  WBC 10.2 7.8 7.9 8.7  NEUTROABS 8.9*  --   --   --   HGB 14.1 13.6 13.2 13.0  HCT 40.4 39.8 38.1 38.0  MCV 86.7 87.7 86.2 87.2  PLT 259 242 197 223   Cardiac Enzymes:  Recent Labs Lab 09/25/14 1440 09/25/14 1748 09/25/14 2234 09/26/14 0501  CKTOTAL 85  --   --   --   TROPONINI  --  <0.03 <0.03 <0.03   BNP (last 3 results) No results for input(s): BNP in the last 8760 hours.  ProBNP (last 3 results) No results for input(s): PROBNP in the last 8760 hours.  CBG: No results for input(s): GLUCAP in the last 168 hours.  Recent Results (from the past 240 hour(s))  MRSA PCR Screening     Status: None   Collection Time: 09/25/14 10:15 PM  Result Value Ref Range Status   MRSA by PCR NEGATIVE NEGATIVE Final    Comment:        The GeneXpert MRSA Assay (FDA approved for NASAL specimens only), is one component of a comprehensive MRSA colonization surveillance  program. It is not intended to diagnose MRSA infection nor to guide or monitor treatment for MRSA infections.      Studies: Dg Chest 2 View  09/27/2014   CLINICAL DATA:  Back pain.  Fall 2 days ago.  Pacemaker.  EXAM: CHEST - 2 VIEW  COMPARISON:  Two-view chest x-ray 09/25/2014 and 05/19/2008.  FINDINGS: A dual-chamber pacemaker has been placed. The leads terminate in the right atrium and right ventricle. There is no pneumothorax. The heart size is normal. Atherosclerotic changes are noted in the aorta.  Emphysematous changes of lungs are again seen. Left-sided nodules are present.  Pleural thickening at the left apex is stable. A granuloma at the right base is stable. No acute superimposed disease is present. Moderate osteopenia is present. The visualized soft tissues and bony thorax are otherwise unremarkable.  IMPRESSION: 1. Interval placement of dual-chamber pacemaker without radiographic evidence for complication. 2. Severe emphysema without significant change. 3. Stable bilateral pulmonary nodules and left greater than right apical pleural thickening.   Electronically Signed   By: San Morelle M.D.   On: 09/27/2014 11:46    Scheduled Meds:  Scheduled Meds: . aspirin  81 mg Oral Daily  . diclofenac  1 patch Transdermal BID  . diltiazem  240 mg Oral Daily  . docusate sodium  100 mg Oral BID  . flecainide  50 mg Oral Q12H  . loratadine  10 mg Oral Daily  . metoprolol succinate  25 mg Oral Daily  . omega-3 acid ethyl esters  2 g Oral Q1200  . oxyCODONE-acetaminophen  1 tablet Oral TID PC & HS  . potassium chloride  40 mEq Oral BID  . sodium chloride  3 mL Intravenous Q12H   Continuous Infusions: . sodium chloride      Time spent on care of this patient: 40 min   [place], MD 09/28/2014, 11:44 AM  LOS: 3 days   [facility] Office  [phone] Pager - Text Page per [email or web address] If 7PM-7AM, please contact night-coverage [email or web address]

## 2014-09-28 NOTE — Progress Notes (Signed)
K+ 5.4 today.  PM K-dur dose held tonight.  BMET to be rechecked in am.  [name]

## 2014-09-28 NOTE — Progress Notes (Signed)
Physical Therapy Treatment Patient Details Name: Beth [name] MRN: [id number] DOB: 08-Oct-1923 Today's Date: 09/28/2014    History of Present Illness Patient admitted with Sinus brady-tachy syndrome and underwent pacemaker placement. Now having significant back pain.   PMH significant for osteoporosis, syncopy and HTN.    PT Comments    Patient continued to be limited in mobility by her back pain.  Required min to mod assist for all mobility and gait.  Patient does not have 24 hour assist and would not be safe or able to care for herself at home alone.  Recommend ST-SNF at discharge for continued therapy prior to return home.   Follow Up Recommendations  SNF;Supervision/Assistance - 24 hour     Equipment Recommendations  None recommended by PT    Recommendations for Other Services       Precautions / Restrictions Precautions Precautions: Fall;ICD/Pacemaker Required Braces or Orthoses: Sling (LUE) Restrictions Weight Bearing Restrictions: No    Mobility  Bed Mobility Overal bed mobility: Needs Assistance Bed Mobility: Rolling;Sidelying to Sit;Sit to Sidelying Rolling: Min guard Sidelying to sit: Mod assist     Sit to sidelying: Min assist General bed mobility comments: Verbal cues for technique to protect LUE.  Worked with patient on using log rolling technique and moving slowly to minimize back pain.   Assist to raise trunk to sitting position.  Once upright, patient with good balance.  Assist to lower trunk and bring LE's onto bed to return to sidelying.   Requires increased time for all mobility due to back pain.  Transfers Overall transfer level: Needs assistance Equipment used: Rolling walker (2 wheeled) Transfers: Sit to/from Stand Sit to Stand: Min assist         General transfer comment: Verbal cues for hand placement.  Assist to rise to standing and for balance during transition.  Ambulation/Gait Ambulation/Gait assistance: Min assist Ambulation  Distance (Feet): 24 Feet Assistive device: Rolling walker (2 wheeled) Gait Pattern/deviations: Step-through pattern;Decreased stride length;Decreased step length - right;Decreased step length - left;Shuffle;Trunk flexed Gait velocity: decreased Gait velocity interpretation: Below normal speed for age/gender General Gait Details: Verbal cues to look up during gait.  Patient with flexed posture and forward head.  Limited distance due to back pain   Stairs            Wheelchair Mobility    Modified Rankin (Stroke Patients Only)       Balance Overall balance assessment: Needs assistance Sitting-balance support: No upper extremity supported Sitting balance-Leahy Scale: Good     Standing balance support: Bilateral upper extremity supported Standing balance-Leahy Scale: Poor Standing balance comment: Required UE support to maintain balance.                    Cognition Arousal/Alertness: Awake/alert Behavior During Therapy: WFL for tasks assessed/performed Overall Cognitive Status: Within Functional Limits for tasks assessed                      Exercises      General Comments        Pertinent Vitals/Pain Pain Assessment: 0-10 Pain Score: 4  Pain Location: Back Pain Descriptors / Indicators: Aching;Grimacing;Sore Pain Intervention(s): Limited activity within patient's tolerance;Repositioned (Patient declined pain meds)    Home Living                      Prior Function            PT Goals (current goals  can now be found in the care plan section) Progress towards PT goals: Progressing toward goals (Slowly)    Frequency  Min 4X/week    PT Plan Discharge plan needs to be updated    Co-evaluation             End of Session Equipment Utilized During Treatment: Gait belt Activity Tolerance: Patient limited by pain;Patient limited by fatigue Patient left: in bed;with call bell/phone within reach;with bed alarm set;with  family/visitor present     Time: 4734-0370 PT Time Calculation (min) (ACUTE ONLY): 40 min  Charges:  $Gait Training: 23-37 mins $Therapeutic Activity: 8-22 mins                    G Codes:      Despina Pole Oct 08, 2014, 7:45 PM Carita Pian. Sanjuana Kava, [place] Pager [phone]

## 2014-09-29 DIAGNOSIS — M545 Low back pain: Secondary | ICD-10-CM

## 2014-09-29 DIAGNOSIS — I10 Essential (primary) hypertension: Secondary | ICD-10-CM

## 2014-09-29 DIAGNOSIS — I4891 Unspecified atrial fibrillation: Secondary | ICD-10-CM

## 2014-09-29 DIAGNOSIS — I495 Sick sinus syndrome: Principal | ICD-10-CM

## 2014-09-29 DIAGNOSIS — I5022 Chronic systolic (congestive) heart failure: Secondary | ICD-10-CM

## 2014-09-29 LAB — BASIC METABOLIC PANEL
Anion gap: 4 — ABNORMAL LOW (ref 5–15)
BUN: 21 mg/dL — ABNORMAL HIGH (ref 6–20)
CO2: 24 mmol/L (ref 22–32)
Calcium: 9 mg/dL (ref 8.9–10.3)
Chloride: 107 mmol/L (ref 101–111)
Creatinine, Ser: 0.88 mg/dL (ref 0.44–1.00)
GFR calc Af Amer: >60 mL/min (ref >60)
GFR calc non Af Amer: 57 mL/min — ABNORMAL LOW (ref >60)
GLUCOSE: 108 mg/dL — AB (ref 65–99)
Potassium: 5.4 mmol/L — ABNORMAL HIGH (ref 3.5–5.1)
SODIUM: 135 mmol/L (ref 135–145)

## 2014-09-29 MED ORDER — POLYETHYLENE GLYCOL 3350 17 G PO PACK
17.0000 g | PACK | Freq: Every day | ORAL | Status: DC
Start: 2014-09-29 — End: 2014-09-30
  Administered 2014-09-29 – 2014-09-30 (×2): 17 g via ORAL
  Filled 2014-09-29 (×2): qty 1

## 2014-09-29 NOTE — Evaluation (Signed)
Clinical/Bedside Swallow Evaluation Patient Details  Name: Beth [name] MRN: [id number] Date of Birth: Jun 14, 1923  Today's Date: 09/29/2014 Time: SLP Start Time (ACUTE ONLY): 57 SLP Stop Time (ACUTE ONLY): 1426 SLP Time Calculation (min) (ACUTE ONLY): 16 min  Past Medical History:  Past Medical History  Diagnosis Date  . Hypertension   . Lung nodule     CT of chest 2007-left loewr lobar mass- evaluated by pulmonary- patient refused further work up  . Tobacco abuse   . Vertigo   . Abdominal pain, unspecified site 08/17/2012  . Other and unspecified hyperlipidemia 08/17/2012  . Loss of weight 01/19/2013  . Allergic state 06/22/2013  . Allergy   . Osteoporosis   . Syncope     fall  . Increased urinary frequency 01/02/2014  . Glaucoma 07/06/2014   Past Surgical History:  Past Surgical History  Procedure Laterality Date  . Cholecystectomy    . Cesarean section    . Hernia repair    . Abdominal hysterectomy     HPI:  Patient admitted with Sinus brady-tachy syndrome and underwent pacemaker placement. PMH significant for osteoporosis, syncopy and HTN .  Reports new dysphagia with pills.   Assessment / Plan / Recommendation Clinical Impression  Pt presents with adequate mastication, brisk swallow response, and no s/s of aspiration.  No oropharyngeal dysphagia detected. She describes new difficulty swallowing large pills, and increased sensation in left pharynx.  Pt describes having "difficulty bringing a belch up," yet she is noted to belch frequently throughout assessment.  Her description sounds more esophageal in nature.  If symptoms don't improve, pt may benefit from esophageal w/u as an OP.  No further SLP needs identified.  Recommend continuing current diet; try smaller pills when available or crush.      Aspiration Risk  Mild    Diet Recommendation  (regular, thin liquids)   Medication Administration: Crushed with puree    Other  Recommendations Recommended Consults:  Consider esophageal assessment (as op) Oral Care Recommendations: Oral care BID   Follow Up Recommendations         Swallow Study Prior Functional Status       General Date of Onset: 09/25/14 Other Pertinent Information: Patient admitted with Sinus brady-tachy syndrome and underwent pacemaker placement. PMH significant for osteoporosis, syncopy and HTN Type of Study: Bedside swallow evaluation Previous Swallow Assessment: none Diet Prior to this Study: Regular;Thin liquids Temperature Spikes Noted: No Respiratory Status: Room air Behavior/Cognition: Alert;Cooperative Oral Cavity - Dentition:  (dentures) Self-Feeding Abilities: Able to feed self Patient Positioning: Upright in bed Baseline Vocal Quality: Normal Volitional Cough: Strong Volitional Swallow: Able to elicit    Oral/Motor/Sensory Function Overall Oral Motor/Sensory Function: Appears within functional limits for tasks assessed   Ice Chips Ice chips: Within functional limits Presentation: Self Fed   Thin Liquid Thin Liquid: Within functional limits Presentation: Cup;Straw    Nectar Thick Nectar Thick Liquid: Not tested   Honey Thick Honey Thick Liquid: Not tested   Puree Puree: Within functional limits Presentation: [place]. [place], [place] CCC/SLP Pager [phone]     Solid: Within functional limits Presentation: Self Fed       Beth [name] 09/29/2014,2:27 PM

## 2014-09-29 NOTE — Progress Notes (Signed)
Physical Therapy Treatment Patient Details Name: Beth [name] MRN: [id number] DOB: 12/05/1923 Today's Date: 09/29/2014    History of Present Illness Patient admitted with Sinus brady-tachy syndrome and underwent pacemaker placement. Now having significant back pain.   PMH significant for osteoporosis, syncopy and HTN.    PT Comments    Patient progressing slowly towards PT goals. Back pain more controlled today. Tolerated gait training with RW with Min A for balance/safety. Increased time to perform all mobility. Pt awaiting ST SNF. Encouraged OOB as much as tolerated however pt reluctant to sit in recliner due to back pain. Will continue to follow acutely.    Follow Up Recommendations  SNF;Supervision/Assistance - 24 hour     Equipment Recommendations  None recommended by PT    Recommendations for Other Services       Precautions / Restrictions Precautions Precautions: Fall;ICD/Pacemaker Required Braces or Orthoses: Sling Restrictions Weight Bearing Restrictions: No    Mobility  Bed Mobility Overal bed mobility: Needs Assistance Bed Mobility: Rolling;Sidelying to Sit;Sit to Sidelying Rolling: Min guard Sidelying to sit: Min assist;HOB elevated     Sit to sidelying: Min assist;HOB elevated General bed mobility comments: VC's for technique to protect LUE. Cues for log roll technique. Min A to raise trunk to seated position. Increased time for all mobility due to back pain.  Transfers Overall transfer level: Needs assistance Equipment used: Rolling walker (2 wheeled) Transfers: Sit to/from Stand Sit to Stand: Min assist         General transfer comment: Min A to boost up to standing with cues for hand placement.   Ambulation/Gait Ambulation/Gait assistance: Min assist Ambulation Distance (Feet): 25 Feet   Gait Pattern/deviations: Step-through pattern;Decreased stride length;Decreased step length - left;Decreased step length - right;Trunk flexed Gait  velocity: decreased   General Gait Details: VC's for upright posture and to look ahead. Cues for RW management/position. Limited distance due to pain.   Stairs            Wheelchair Mobility    Modified Rankin (Stroke Patients Only)       Balance Overall balance assessment: Needs assistance Sitting-balance support: Feet supported;No upper extremity supported Sitting balance-Leahy Scale: Good     Standing balance support: During functional activity Standing balance-Leahy Scale: Poor Standing balance comment: Relient on UE for support.                     Cognition Arousal/Alertness: Awake/alert Behavior During Therapy: WFL for tasks assessed/performed Overall Cognitive Status: Within Functional Limits for tasks assessed                      Exercises      General Comments        Pertinent Vitals/Pain Pain Assessment: Faces Faces Pain Scale: Hurts little more Pain Location: back Pain Descriptors / Indicators: Sore;Aching Pain Intervention(s): Monitored during session;Repositioned    Home Living                      Prior Function            PT Goals (current goals can now be found in the care plan section) Progress towards PT goals: Progressing toward goals (slowly)    Frequency  Min 4X/week    PT Plan Current plan remains appropriate    Co-evaluation             End of Session Equipment Utilized During Treatment: Gait belt Activity Tolerance:  Patient limited by pain;Patient tolerated treatment well Patient left: in bed;with call bell/phone within reach;with bed alarm set     Time: 1516-1536 PT Time Calculation (min) (ACUTE ONLY): 20 min  Charges:  $Gait Training: 8-22 mins                    G Codes:      Aletheia Tangredi A Chaunta Bejarano 09/29/2014, 4:45 PM  Wray Kearns, [place], DPT [phone]

## 2014-09-29 NOTE — Progress Notes (Signed)
[facility] PROGRESS NOTE Assessment/Plan:  Tachybradycardia syndrome/syncope: Status post pacemaker on 09/27/2014, follow-up with cardiology as an outpatient. Awaiting placement at SNF  Paroxysmal atrial fibrillation: Calculi in sinus rhythm, currently not a candidate for anticoagulation due to falls. Continue aspirin, Toprol, diltiazem to light.  Mild back pain Continue fentanyl patch as well as Percocet, evaluated by physical therapy recommended skilled nursing facility.  Essential hypertension: Continue current regimen no changes.  Osteoporosis:  Pulmonary nodule: It was discussed with patient she was no further workup.  Chronic systolic heart failure: Resume ARB, currently none diuretic.  Seems to be euvolemic.   Code Status: DO NOT RESUSCITATE Family Communication:  Disposition Plan: Home versus skilled nursing facility depending on back pain DVT prophylaxis: Start Lovenox Consultants: Cardiology/EP   Procedures:  Echo  Antibiotics:  None  HPI/Subjective: She feels tires.  Objective: Filed Vitals:   09/28/14 1341 09/28/14 2041 09/28/14 2355 09/29/14 0455  BP: 138/69 134/51 123/49 121/52  Pulse: 60 65 60 59  Temp: 97.7 F (36.5 C) 98 F (36.7 C) 97.8 F (36.6 C) 97.4 F (36.3 C)  TempSrc: Oral Oral Oral Oral  Resp: 16 18  18   Height:      Weight:    47.255 kg (104 lb 2.9 oz)  SpO2: 90% 92% 92% 92%    Intake/Output Summary (Last 24 hours) at 09/29/14 1046 Last data filed at 09/29/14 0600  Gross per 24 hour  Intake    240 ml  Output    225 ml  Net     15 ml   Filed Weights   09/27/14 0400 09/28/14 0400 09/29/14 0455  Weight: 50.259 kg (110 lb 12.8 oz) 47.356 kg (104 lb 6.4 oz) 47.255 kg (104 lb 2.9 oz)    Exam:  General: Alert, awake, oriented x3, in no acute distress.  HEENT: No bruits, no goiter.  Heart: Regular rate and rhythm. Lungs: Good air movement, clear,  Scar clean. Abdomen: Soft, nontender, nondistended, positive  bowel sounds.  Neuro: Grossly intact, nonfocal.   Data Reviewed: Basic Metabolic Panel:  Recent Labs Lab 09/25/14 1440 09/26/14 0501 09/27/14 0425 09/28/14 1956 09/29/14 0421  NA 142 139 135 130* 135  K 3.7 3.3* 2.9* 5.4* 5.4*  CL 111 106 103 103 107  CO2 23 24 21* 21* 24  GLUCOSE 130* 97 135* 130* 108*  BUN 8 8 9  22* 21*  CREATININE 0.71 0.66 0.55 1.00 0.88  CALCIUM 9.0 8.9 8.4* 8.7* 9.0  MG  --   --  1.8  --   --    Liver Function Tests:  Recent Labs Lab 09/25/14 1440 09/26/14 0501 09/27/14 0425  AST 27 21 20   ALT 19 17 16   ALKPHOS 63 64 61  BILITOT 0.6 0.7 0.8  PROT 6.8 6.6 6.3*  ALBUMIN 3.6 3.4* 3.1*   No results for input(s): LIPASE, AMYLASE in the last 168 hours. No results for input(s): AMMONIA in the last 168 hours. CBC:  Recent Labs Lab 09/25/14 1440 09/26/14 0501 09/27/14 0425 09/28/14 0355  WBC 10.2 7.8 7.9 8.7  NEUTROABS 8.9*  --   --   --   HGB 14.1 13.6 13.2 13.0  HCT 40.4 39.8 38.1 38.0  MCV 86.7 87.7 86.2 87.2  PLT 259 242 197 223   Cardiac Enzymes:  Recent Labs Lab 09/25/14 1440 09/25/14 1748 09/25/14 2234 09/26/14 0501  CKTOTAL 85  --   --   --   TROPONINI  --  <0.03 <0.03 <0.03  BNP (last 3 results) No results for input(s): BNP in the last 8760 hours.  ProBNP (last 3 results) No results for input(s): PROBNP in the last 8760 hours.  CBG: No results for input(s): GLUCAP in the last 168 hours.  Recent Results (from the past 240 hour(s))  MRSA PCR Screening     Status: None   Collection Time: 09/25/14 10:15 PM  Result Value Ref Range Status   MRSA by PCR NEGATIVE NEGATIVE Final    Comment:        The GeneXpert MRSA Assay (FDA approved for NASAL specimens only), is one component of a comprehensive MRSA colonization surveillance program. It is not intended to diagnose MRSA infection nor to guide or monitor treatment for MRSA infections.      Studies: Dg Chest 2 View  09/27/2014   CLINICAL DATA:  Back pain.   Fall 2 days ago.  Pacemaker.  EXAM: CHEST - 2 VIEW  COMPARISON:  Two-view chest x-ray 09/25/2014 and 05/19/2008.  FINDINGS: A dual-chamber pacemaker has been placed. The leads terminate in the right atrium and right ventricle. There is no pneumothorax. The heart size is normal. Atherosclerotic changes are noted in the aorta.  Emphysematous changes of lungs are again seen. Left-sided nodules are present. Pleural thickening at the left apex is stable. A granuloma at the right base is stable. No acute superimposed disease is present. Moderate osteopenia is present. The visualized soft tissues and bony thorax are otherwise unremarkable.  IMPRESSION: 1. Interval placement of dual-chamber pacemaker without radiographic evidence for complication. 2. Severe emphysema without significant change. 3. Stable bilateral pulmonary nodules and left greater than right apical pleural thickening.   Electronically Signed   By: San Morelle M.D.   On: 09/27/2014 11:46    Scheduled Meds: . aspirin  81 mg Oral Daily  . diclofenac  1 patch Transdermal BID  . diltiazem  240 mg Oral Daily  . docusate sodium  100 mg Oral BID  . flecainide  50 mg Oral Q12H  . loratadine  10 mg Oral Daily  . losartan  25 mg Oral Daily  . metoprolol succinate  25 mg Oral Daily  . omega-3 acid ethyl esters  2 g Oral Q1200  . oxyCODONE-acetaminophen  1 tablet Oral TID PC & HS  . sodium chloride  3 mL Intravenous Q12H   Continuous Infusions: . sodium chloride      Time Spent: 25 min   Charlynne Cousins  [facility] Pager [phone]. If 7PM-7AM, please contact night-coverage at [email or web address], password [facility] 09/29/2014, 10:46 AM  LOS: 4 days

## 2014-09-29 NOTE — Progress Notes (Signed)
Patient is to discharged to SNF, CM called and left voicemail messages for the weekend SW at [phone] and [phone] respectively.

## 2014-09-29 NOTE — Care Management Note (Signed)
Case Management Note  Patient Details  Name: KASHAE CARSTENS MRN: [id number] Date of Birth: [birth year]  Subjective/Objective:         Admitted with syncope and was found to have AF with RVR as well as up to 5 second post termination pauses with intermittent junctional rhythm.          Action/Plan: SNF recommendeded, left voicemail messages for weekend Education officer, museum.  Expected Discharge Date:                  Expected Discharge Plan:  Skilled Nursing Facility  In-House Referral:  Clinical Social Work  Discharge planning Services  CM Consult  Post Acute Care Choice:    Choice offered to:     DME Arranged:    DME Agency:     HH Arranged:    [place] Agency:     Status of Service:  Completed, signed off  Medicare Important Message Given:  Yes-second notification given Date Medicare IM Given:    Medicare IM give by:    Date Additional Medicare IM Given:    Additional Medicare Important Message give by:     If discussed at [place] of Stay Meetings, dates discussed:    Additional Comments:  Dimas Aguas, RN 09/29/2014, 9:06 AM

## 2014-09-30 ENCOUNTER — Encounter (HOSPITAL_COMMUNITY): Payer: Self-pay | Admitting: Internal Medicine

## 2014-09-30 DIAGNOSIS — R911 Solitary pulmonary nodule: Secondary | ICD-10-CM | POA: Diagnosis not present

## 2014-09-30 DIAGNOSIS — I4891 Unspecified atrial fibrillation: Secondary | ICD-10-CM | POA: Diagnosis not present

## 2014-09-30 DIAGNOSIS — I10 Essential (primary) hypertension: Secondary | ICD-10-CM | POA: Diagnosis not present

## 2014-09-30 DIAGNOSIS — E46 Unspecified protein-calorie malnutrition: Secondary | ICD-10-CM | POA: Diagnosis not present

## 2014-09-30 DIAGNOSIS — I480 Paroxysmal atrial fibrillation: Secondary | ICD-10-CM | POA: Diagnosis not present

## 2014-09-30 DIAGNOSIS — I495 Sick sinus syndrome: Secondary | ICD-10-CM | POA: Diagnosis not present

## 2014-09-30 DIAGNOSIS — M545 Low back pain: Secondary | ICD-10-CM | POA: Diagnosis not present

## 2014-09-30 DIAGNOSIS — E875 Hyperkalemia: Secondary | ICD-10-CM | POA: Diagnosis not present

## 2014-09-30 DIAGNOSIS — M8580 Other specified disorders of bone density and structure, unspecified site: Secondary | ICD-10-CM | POA: Diagnosis not present

## 2014-09-30 DIAGNOSIS — I5022 Chronic systolic (congestive) heart failure: Secondary | ICD-10-CM | POA: Diagnosis not present

## 2014-09-30 DIAGNOSIS — K5900 Constipation, unspecified: Secondary | ICD-10-CM | POA: Diagnosis not present

## 2014-09-30 DIAGNOSIS — E43 Unspecified severe protein-calorie malnutrition: Secondary | ICD-10-CM | POA: Diagnosis not present

## 2014-09-30 DIAGNOSIS — I351 Nonrheumatic aortic (valve) insufficiency: Secondary | ICD-10-CM | POA: Diagnosis not present

## 2014-09-30 DIAGNOSIS — Z5189 Encounter for other specified aftercare: Secondary | ICD-10-CM | POA: Diagnosis not present

## 2014-09-30 DIAGNOSIS — E559 Vitamin D deficiency, unspecified: Secondary | ICD-10-CM | POA: Diagnosis not present

## 2014-09-30 DIAGNOSIS — E785 Hyperlipidemia, unspecified: Secondary | ICD-10-CM | POA: Diagnosis not present

## 2014-09-30 DIAGNOSIS — R5381 Other malaise: Secondary | ICD-10-CM | POA: Diagnosis not present

## 2014-09-30 DIAGNOSIS — M6281 Muscle weakness (generalized): Secondary | ICD-10-CM | POA: Diagnosis not present

## 2014-09-30 DIAGNOSIS — R001 Bradycardia, unspecified: Secondary | ICD-10-CM | POA: Diagnosis not present

## 2014-09-30 DIAGNOSIS — Z9181 History of falling: Secondary | ICD-10-CM | POA: Diagnosis not present

## 2014-09-30 DIAGNOSIS — I279 Pulmonary heart disease, unspecified: Secondary | ICD-10-CM | POA: Diagnosis not present

## 2014-09-30 DIAGNOSIS — Z950 Presence of cardiac pacemaker: Secondary | ICD-10-CM | POA: Diagnosis not present

## 2014-09-30 DIAGNOSIS — M810 Age-related osteoporosis without current pathological fracture: Secondary | ICD-10-CM | POA: Diagnosis not present

## 2014-09-30 DIAGNOSIS — R2681 Unsteadiness on feet: Secondary | ICD-10-CM | POA: Diagnosis not present

## 2014-09-30 DIAGNOSIS — J309 Allergic rhinitis, unspecified: Secondary | ICD-10-CM | POA: Diagnosis not present

## 2014-09-30 MED ORDER — FLUCONAZOLE 100 MG PO TABS: 100.0000 mg | ORAL_TABLET | Freq: Every day | ORAL | Status: DC

## 2014-09-30 MED ORDER — FLUCONAZOLE 100 MG PO TABS
100.0000 mg | ORAL_TABLET | Freq: Every day | ORAL | Status: DC
  Filled 2014-09-30: qty 1

## 2014-09-30 MED ORDER — LOSARTAN POTASSIUM 25 MG PO TABS: 25.0000 mg | ORAL_TABLET | Freq: Every day | ORAL | Status: DC

## 2014-09-30 MED ORDER — POLYETHYLENE GLYCOL 3350 17 G PO PACK
17.0000 g | PACK | Freq: Every day | ORAL | Status: DC | PRN
Start: 2014-09-30 — End: 2014-11-17

## 2014-09-30 NOTE — Clinical Social Work Placement (Signed)
   CLINICAL SOCIAL WORK PLACEMENT  NOTE  Date:  09/30/2014  Patient Details  Name: Beth [name] MRN: [id number] Date of Birth: 1924/02/02  Clinical Social Work is seeking post-discharge placement for this patient at the [place] level of care (*CSW will initial, date and re-position this form in  chart as items are completed):  Yes   Patient/family provided with [place] Work Department's list of facilities offering this level of care within the geographic area requested by the patient (or if unable, by the patient's family).  Yes   Patient/family informed of their freedom to choose among providers that offer the needed level of care, that participate in Medicare, Medicaid or managed care program needed by the patient, have an available bed and are willing to accept the patient.  Yes   Patient/family informed of [facility] ownership interest in [facility] and [facility], as well as of the fact that they are under no obligation to receive care at these facilities.  PASRR submitted to EDS on       PASRR number received on       Existing PASRR number confirmed on 09/29/14     FL2 transmitted to all facilities in geographic area requested by pt/family on 09/29/14     FL2 transmitted to all facilities within larger geographic area on       Patient informed that his/her managed care company has contracts with or will negotiate with certain facilities, including the following:            Patient/family informed of bed offers received.  Patient chooses bed at       Physician recommends and patient chooses bed at      Patient to be transferred to   on  .  Patient to be transferred to facility by       Patient family notified on   of transfer.  Name of family member notified:        PHYSICIAN Please sign FL2, Please prepare priority discharge summary, including medications, Please prepare prescriptions     Additional Comment:     _______________________________________________ Williemae Area, LCSW  [phone]

## 2014-09-30 NOTE — Care Management (Signed)
Important Message  Patient Details  Name: [name] MRN: [id number] Date of Birth: [birth year]   Medicare Important Message Given:  Yes-third notification given    [name] 09/30/2014, 10:51 AM

## 2014-09-30 NOTE — Discharge Summary (Signed)
Physician Discharge Summary  Beth [name] BCW:[id number] DOB: 1923-09-13 81A: 09/25/2014  PCP: Penni Homans, MD  Admit date: 09/25/2014 Discharge date: 09/30/2014  Time spent: 25 minutes  Recommendations for Outpatient Follow-up:  Needs OP follow up with Cardiologist Dr. Lovena Le as per his rec's Needs attention to Lower back pain-consider repeat Xrays or Ct if persisting pain Consider OP pulmonary eval for Pulm Htn-suspect not really a candidate for PDE-5 inhibitors but might benefit from diuresis as oxygen when followed up by PCP  Discharge Diagnoses:  Principal Problem:   Sinus brady-tachy syndrome Active Problems:   Vitamin D deficiency   Essential hypertension   Osteoporosis   Dystrophic nail   Aortic insufficiency   Hyperlipidemia   Syncope   Atrial fibrillation with RVR   Solitary pulmonary nodule   Pulmonary HTN   Midline low back pain without sciatica   Chronic systolic heart failure   Discharge Condition: stable  Diet recommendation: regular  Filed Weights   09/28/14 0400 09/29/14 0455 09/30/14 0500  Weight: 47.356 kg (104 lb 6.4 oz) 47.255 kg (104 lb 2.9 oz) 46.176 kg (101 lb 12.8 oz)    History of present illness:  81 y/o ? prior Afib foll chr by Dr. Acie Fredrickson 7.2 % stroke rate/year from a score of 5-not on AC 2/2 to prohibitive fall risks, solitary pulmonary nodule, HTN, vertigo, multiple falls felt secondary to atrial fibrillation, presbycusis, osteoporosis per DEXA scan 2012, prior rib fractures after falls, severe protein energy malnutrition, aortic insufficiency, pulmonary hypertension based on echocardiogram 09/26/14-PA peak 66 mmHg admitted 09/25/14 with a syncopal event resulting in a fall found to be in atrial fibrillation with RVR rates in the low 200s/180s given IV Cardizem and had converted to sinus rhythm at the time of initial encounter See below  Hospital Course:  Sick Sinus syndrome/tachybradycardia syndrome: With Elevated chads score chads score,  cardiology was consulted and she is status post pacemaker placement by Dr. Lovena Le. She will continue on Cardizem, metoprolol and aspirin. She will follow up with cardiology as an outpatient.  Syncope: Probably due to #1 follow-up with cardiology as an outpatient.  Pulmonary hypertension: Mixed picture question secondary to chronic renal disease/COPD.  Chronic diastolic heart failure continue current treatment.  Solitary pulmonary nodule: She will not be it candidate for surgery so no further evaluation.   Procedures:  Echo  pacemenaker  Consultations:  cardiology  Discharge Exam: Filed Vitals:   09/30/14 0937  BP: 151/57  Pulse: 84  Temp:   Resp:     General: A&O x3 Cardiovascular: RRR Respiratory: good air movement CTA B/L  Discharge Instructions   Discharge Instructions    Diet - low sodium heart healthy    Complete by:  As directed      Diet - low sodium heart healthy    Complete by:  As directed      Discharge instructions    Complete by:  As directed   Look at your medication list carefully for discharge home-there are some changes to your medicine4s See cardiology-they will set-up a follow up Get lab work within 1 week at PCP office     Increase activity slowly    Complete by:  As directed      Increase activity slowly    Complete by:  As directed           Current Discharge Medication List    START taking these medications   Details  flecainide (TAMBOCOR) 50 MG tablet Take 1 tablet (50  mg total) by mouth every 12 (twelve) hours. Qty: 60 tablet, Refills: 5    fluconazole (DIFLUCAN) 100 MG tablet Take 1 tablet (100 mg total) by mouth daily. Qty: 7 tablet, Refills: 0    polyethylene glycol (MIRALAX / GLYCOLAX) packet Take 17 g by mouth daily as needed. Qty: 14 each, Refills: 0    potassium chloride SA (K-DUR,KLOR-CON) 20 MEQ tablet Take 2 tablets (40 mEq total) by mouth 2 (two) times daily. Qty: 30 tablet, Refills: 0      CONTINUE these  medications which have CHANGED   Details  losartan (COZAAR) 25 MG tablet Take 1 tablet (25 mg total) by mouth daily.      CONTINUE these medications which have NOT CHANGED   Details  aspirin 81 MG chewable tablet Chew 1 tablet (81 mg total) by mouth daily. Qty: 30 tablet, Refills: 11    cetirizine (ZYRTEC) 10 MG tablet Take 10 mg by mouth every morning.    diltiazem (TIAZAC) 240 MG 24 hr capsule TAKE 1 CAPSULE (240 MG TOTAL) BY MOUTH DAILY. Qty: 30 capsule, Refills: 6    fish oil-omega-3 fatty acids 1000 MG capsule Take 2 g by mouth daily at 12 noon.     metoprolol succinate (TOPROL-XL) 25 MG 24 hr tablet TAKE 1 TABLET (25 MG TOTAL) BY MOUTH DAILY. Qty: 90 tablet, Refills: 1    Multiple Vitamin (MULTIVITAMIN) tablet Take 1 tablet by mouth daily at 12 noon.     Saline (SODIUM CHLORIDE) 0.65 % SOLN Place 1 spray into the nose as needed for congestion. 2 sprays each nostril once daily as needed    trimethoprim-polymyxin b (POLYTRIM) ophthalmic solution Place 2 drops into the left eye every 6 (six) hours. Qty: 10 mL, Refills: 0      STOP taking these medications     calcium-vitamin D (OSCAL WITH D 500-200) 500-200 MG-UNIT per tablet      mupirocin ointment (BACTROBAN) 2 %        Allergies  Allergen Reactions  . Ciprofloxacin Other (See Comments)    myalgias  . Fexofenadine Other (See Comments)    unknown  . Penicillins Other (See Comments)   Follow-up Information    Follow up with [facility] On 10/06/2014.   Why:  at [facility] for wound check   Contact information:   [address] [place] [address]       Follow up with Cristopher Peru, MD.   Specialty:  Cardiology   Why:  Office scheduler will contact you to arrange followup, please give [place] a call if you do not hear from [place] in 2 business days   Contact information:   [address] [place] [address] [phone]        The results of significant diagnostics  from this hospitalization (including imaging, microbiology, ancillary and laboratory) are listed below for reference.    Significant Diagnostic Studies: Dg Chest 2 View  09/27/2014   CLINICAL DATA:  Back pain.  Fall 2 days ago.  Pacemaker.  EXAM: CHEST - 2 VIEW  COMPARISON:  Two-view chest x-ray 09/25/2014 and 05/19/2008.  FINDINGS: A dual-chamber pacemaker has been placed. The leads terminate in the right atrium and right ventricle. There is no pneumothorax. The heart size is normal. Atherosclerotic changes are noted in the aorta.  Emphysematous changes of lungs are again seen. Left-sided nodules are present. Pleural thickening at the left apex is stable. A granuloma at the right base  is stable. No acute superimposed disease is present. Moderate osteopenia is present. The visualized soft tissues and bony thorax are otherwise unremarkable.  IMPRESSION: 1. Interval placement of dual-chamber pacemaker without radiographic evidence for complication. 2. Severe emphysema without significant change. 3. Stable bilateral pulmonary nodules and left greater than right apical pleural thickening.   Electronically Signed   By: San Morelle M.D.   On: 09/27/2014 11:46   Dg Chest 2 View  09/25/2014   CLINICAL DATA:  81 year old female with fall and chest pain.  EXAM: CHEST  2 VIEW  COMPARISON:  Chest radiograph dated 07/01/2014 and chest CT dated 08/12/2005 .  FINDINGS: Two views of the chest demonstrate emphysematous changes of the lungs. There is no focal consolidation. No pleural effusion or pneumothorax. Multiple bilateral pulmonary nodules similar to the prior study most compatible with metastatic disease. Left upper lobe pleural-based density similar to prior study likely corresponding to the pleural based mass seen on the CT dated 08/12/2005. The cardiomediastinal silhouette is within normal limits. There is osteopenia. Degenerative changes of the spine.  IMPRESSION: No definite acute/traumatic intrathoracic  pathology.  Emphysema.  No focal consolidation or pneumothorax.  Multiple bilateral pulmonary nodules grossly similar to the prior study and concerning for metastatic disease.   Electronically Signed   By: Anner Crete M.D.   On: 09/25/2014 15:09   Dg Lumbar Spine Complete  09/25/2014   CLINICAL DATA:  81 year old female with a history of fall and lower back pain  EXAM: LUMBAR SPINE - COMPLETE 4+ VIEW  COMPARISON:  06/28/2013, 08/07/2009  FINDINGS: Apex right scoliotic curvature of the lumbar spine, centered at L3. There is associated multilevel degenerative disc disease and facet disease.  Diffuse osteopenia.  Relative maintenance of the anatomic alignment on the lateral view. There is mild retrolisthesis of L1 on L2 and L2 on L3, as well as mild anterolisthesis of L4 on L5. This may be projectional given the scoliotic curvature.  Vertebral body heights maintained, similar to the comparison.  No fracture line identified.  Dense atherosclerotic calcifications of the abdomen and pelvis.  IMPRESSION: No radiographic evidence of acute fracture or malalignment of the lumbar spine, however, osteopenia limits evaluation for nondisplaced fractures, and if there is ongoing concern for acute injury, MRI may be considered as a more sensitive test.  Scoliotic curvature with associated degenerative changes.  Atherosclerosis.  Signed,  Dulcy Fanny. Earleen Newport, DO  [facility]  [facility]   Electronically Signed   By: Corrie Mckusick D.O.   On: 09/25/2014 15:05   Ct Head Wo Contrast  09/25/2014   CLINICAL DATA:  Syncopal episode. Unknown period of loss of consciousness. Fell earlier today. Patient does not complain of headache.  EXAM: CT HEAD WITHOUT CONTRAST  TECHNIQUE: Contiguous axial images were obtained from the base of the skull through the vertex without intravenous contrast.  COMPARISON:  None.  FINDINGS: Global atrophy with small vessel disease. No acute stroke or  hemorrhage. No mass lesion or extra-axial fluid. Hydrocephalus ex vacuo.  There is no visible skull fracture or worrisome osseous lesion. No significant scalp hematoma.  Moderately advanced vascular calcification. Paranasal sinuses show no significant fluid accumulation.  IMPRESSION: Chronic changes as described.  No acute intracranial findings.   Electronically Signed   By: Staci Righter M.D.   On: 09/25/2014 15:27    Microbiology: Recent Results (from the past 240 hour(s))  MRSA PCR Screening     Status: None   Collection Time:  09/25/14 10:15 PM  Result Value Ref Range Status   MRSA by PCR NEGATIVE NEGATIVE Final    Comment:        The GeneXpert MRSA Assay (FDA approved for NASAL specimens only), is one component of a comprehensive MRSA colonization surveillance program. It is not intended to diagnose MRSA infection nor to guide or monitor treatment for MRSA infections.      Labs: Basic Metabolic Panel:  Recent Labs Lab 09/25/14 1440 09/26/14 0501 09/27/14 0425 09/28/14 1956 09/29/14 0421  NA 142 139 135 130* 135  K 3.7 3.3* 2.9* 5.4* 5.4*  CL 111 106 103 103 107  CO2 23 24 21* 21* 24  GLUCOSE 130* 97 135* 130* 108*  BUN 8 8 9  22* 21*  CREATININE 0.71 0.66 0.55 1.00 0.88  CALCIUM 9.0 8.9 8.4* 8.7* 9.0  MG  --   --  1.8  --   --    Liver Function Tests:  Recent Labs Lab 09/25/14 1440 09/26/14 0501 09/27/14 0425  AST 27 21 20   ALT 19 17 16   ALKPHOS 63 64 61  BILITOT 0.6 0.7 0.8  PROT 6.8 6.6 6.3*  ALBUMIN 3.6 3.4* 3.1*   No results for input(s): LIPASE, AMYLASE in the last 168 hours. No results for input(s): AMMONIA in the last 168 hours. CBC:  Recent Labs Lab 09/25/14 1440 09/26/14 0501 09/27/14 0425 09/28/14 0355  WBC 10.2 7.8 7.9 8.7  NEUTROABS 8.9*  --   --   --   HGB 14.1 13.6 13.2 13.0  HCT 40.4 39.8 38.1 38.0  MCV 86.7 87.7 86.2 87.2  PLT 259 242 197 223   Cardiac Enzymes:  Recent Labs Lab 09/25/14 1440 09/25/14 1748 09/25/14 2234  09/26/14 0501  CKTOTAL 85  --   --   --   TROPONINI  --  <0.03 <0.03 <0.03   BNP: BNP (last 3 results) No results for input(s): BNP in the last 8760 hours.  ProBNP (last 3 results) No results for input(s): PROBNP in the last 8760 hours.  CBG: No results for input(s): GLUCAP in the last 168 hours.     Signed:  Charlynne Cousins  [facility] 09/30/2014, 11:09 AM

## 2014-09-30 NOTE — Clinical Social Work Placement (Signed)
   CLINICAL SOCIAL WORK PLACEMENT  NOTE  Date:  09/30/2014  Patient Details  Name: Beth [name] MRN: [id number] Date of Birth: 01-04-1924  Clinical Social Work is seeking post-discharge placement for this patient at the [place] level of care (*CSW will initial, date and re-position this form in  chart as items are completed):  Yes   Patient/family provided with [place] Work Department's list of facilities offering this level of care within the geographic area requested by the patient (or if unable, by the patient's family).  Yes   Patient/family informed of their freedom to choose among providers that offer the needed level of care, that participate in Medicare, Medicaid or managed care program needed by the patient, have an available bed and are willing to accept the patient.  Yes   Patient/family informed of [facility]'s ownership interest in [facility] and [facility], as well as of the fact that they are under no obligation to receive care at these facilities.  PASRR submitted to EDS on       PASRR number received on       Existing PASRR number confirmed on 09/29/14     FL2 transmitted to all facilities in geographic area requested by pt/family on 09/29/14     FL2 transmitted to all facilities within larger geographic area on       Patient informed that his/her managed care company has contracts with or will negotiate with certain facilities, including the following:   (medicare)     Yes   Patient/family informed of bed offers received.  Patient chooses bed at [facility]     Physician recommends and patient chooses bed at      Patient to be transferred to [facility] on 09/30/14.  Patient to be transferred to facility by Ambulance [facility]y Harold)     Patient family notified on 09/30/14 of transfer.  Name of family member notified:  Beth [name]- Daughter     PHYSICIAN Please sign FL2, Please prepare priority discharge  summary, including medications, Please prepare prescriptions     Additional Comment: DC 09/30/14 to [facility] for short term SNF. Nursing notified to call report.  Patient and daughter Beth [name] are very happy with SNF arrangements and that patient was able to receive a private room.  No further CSW needs identified.  CSW signing off.  Lorie Phenix. Murrell Redden [phone]      _______________________________________________ Williemae Area, LCSW 09/30/2014, 9:54 PM

## 2014-10-01 ENCOUNTER — Non-Acute Institutional Stay (SKILLED_NURSING_FACILITY): Payer: Medicare Other | Admitting: Adult Health

## 2014-10-01 ENCOUNTER — Encounter: Payer: Self-pay | Admitting: Adult Health

## 2014-10-01 DIAGNOSIS — K5900 Constipation, unspecified: Secondary | ICD-10-CM

## 2014-10-01 DIAGNOSIS — M810 Age-related osteoporosis without current pathological fracture: Secondary | ICD-10-CM | POA: Diagnosis not present

## 2014-10-01 DIAGNOSIS — R911 Solitary pulmonary nodule: Secondary | ICD-10-CM | POA: Diagnosis not present

## 2014-10-01 DIAGNOSIS — I495 Sick sinus syndrome: Secondary | ICD-10-CM | POA: Diagnosis not present

## 2014-10-01 DIAGNOSIS — I10 Essential (primary) hypertension: Secondary | ICD-10-CM

## 2014-10-01 DIAGNOSIS — I4891 Unspecified atrial fibrillation: Secondary | ICD-10-CM | POA: Diagnosis not present

## 2014-10-01 DIAGNOSIS — R5381 Other malaise: Secondary | ICD-10-CM | POA: Diagnosis not present

## 2014-10-01 DIAGNOSIS — E43 Unspecified severe protein-calorie malnutrition: Secondary | ICD-10-CM | POA: Diagnosis not present

## 2014-10-01 DIAGNOSIS — J309 Allergic rhinitis, unspecified: Secondary | ICD-10-CM

## 2014-10-01 NOTE — Progress Notes (Signed)
Patient ID: Beth [name], female   DOB: Aug 28, 1923, 81 y.o.   MRN: [id number]   10/01/2014  Facility:  Nursing Home Location:  [place] Room Number: 706-P LEVEL OF CARE:  SNF (31)  Chief Complaint  Patient presents with  . Hospitalization Follow-up    Physical deconditioning, tachybradycardia syndrome, atrial fibrillation, protein calorie malnutrition, solitary pulmonary nodule, essential hypertension, allergic rhinitis, constipation and hypokalemia     HISTORY OF PRESENT ILLNESS:  This is an 81 year old female who has been admitted to [facility] on 09/30/14 from [facility]. She has PMH of atrial fibrillation, solitary pulmonary nodule, hypertension, vertigo, multiple falls secondary to Atrial fibrillation, presbycusis, osteoporosis, prior rib fractures after falls, severe protein cholerae malnutrition, I aortic insufficiency and pulmonary hypertension. She had a syncopal episode resulting in a fall. She was diagnosed to have atrial fibrillation with RVR with rates in the 200s/180s, was given IV Cardizem and converted to sinus rhythm. She had pacemaker placement on 7/2.  She has been admitted for a short-term rehabilitation.  PAST MEDICAL HISTORY:  Past Medical History  Diagnosis Date  . Hypertension   . Lung nodule     CT of chest 2007-left loewr lobar mass- evaluated by pulmonary- patient refused further work up  . Tobacco abuse   . Vertigo   . Abdominal pain, unspecified site 08/17/2012  . Other and unspecified hyperlipidemia 08/17/2012  . Loss of weight 01/19/2013  . Allergic state 06/22/2013  . Allergy   . Osteoporosis   . Syncope     fall  . Increased urinary frequency 01/02/2014  . Glaucoma 07/06/2014    CURRENT MEDICATIONS: Reviewed per MAR/see medication list  Allergies  Allergen Reactions  . Ciprofloxacin Other (See Comments)    myalgias  . Fexofenadine Other (See Comments)    unknown  . Penicillins Other (See Comments)      REVIEW OF SYSTEMS:  GENERAL: no change in appetite, no fatigue, no weight changes, no fever, chills or weakness RESPIRATORY: no cough, SOB, DOE, wheezing, hemoptysis CARDIAC: no chest pain, edema or palpitations GI: no abdominal pain, diarrhea, constipation, heart burn, nausea or vomiting  PHYSICAL EXAMINATION  GENERAL: no acute distress, normal body habitus SKIN:  Left chest pacemaker surgical site with steri-strips, dry, no erythema EYES: conjunctivae normal, sclerae normal, normal eye lids NECK: supple, trachea midline, no neck masses, no thyroid tenderness, no thyromegaly LYMPHATICS: no LAN in the neck, no supraclavicular LAN RESPIRATORY: breathing is even & unlabored, BS CTAB CARDIAC: RRR, no murmur,no extra heart sounds, no edema GI: abdomen soft, normal BS, no masses, no tenderness, no hepatomegaly, no splenomegaly EXTREMITIES:  Able to move 4 extremities PSYCHIATRIC: the patient is alert & oriented to person, affect & behavior appropriate  LABS/RADIOLOGY: Labs reviewed: Basic Metabolic Panel:  Recent Labs  01/02/14 0952  09/27/14 0425 09/28/14 1956 09/29/14 0421  NA 141  < > 135 130* 135  K 4.5  < > 2.9* 5.4* 5.4*  CL 106  < > 103 103 107  CO2 26  < > 21* 21* 24  GLUCOSE 86  < > 135* 130* 108*  BUN 10  < > 9 22* 21*  CREATININE 0.6  < > 0.55 1.00 0.88  CALCIUM 9.4  < > 8.4* 8.7* 9.0  MG  --   --  1.8  --   --   PHOS 3.4  --   --   --   --   < > = values  in this interval not displayed. Liver Function Tests:  Recent Labs  09/25/14 1440 09/26/14 0501 09/27/14 0425  AST 27 21 20   ALT 19 17 16   ALKPHOS 63 64 61  BILITOT 0.6 0.7 0.8  PROT 6.8 6.6 6.3*  ALBUMIN 3.6 3.4* 3.1*   CBC:  Recent Labs  09/25/14 1440 09/26/14 0501 09/27/14 0425 09/28/14 0355  WBC 10.2 7.8 7.9 8.7  NEUTROABS 8.9*  --   --   --   HGB 14.1 13.6 13.2 13.0  HCT 40.4 39.8 38.1 38.0  MCV 86.7 87.7 86.2 87.2  PLT 259 242 197 223   Lipid Panel:  Recent Labs   08/28/14 1505  HDL 66.90   Cardiac Enzymes:  Recent Labs  09/25/14 1440 09/25/14 1748 09/25/14 2234 09/26/14 0501  CKTOTAL 85  --   --   --   TROPONINI  --  <0.03 <0.03 <0.03     Dg Chest 2 View  09/27/2014   CLINICAL DATA:  Back pain.  Fall 2 days ago.  Pacemaker.  EXAM: CHEST - 2 VIEW  COMPARISON:  Two-view chest x-ray 09/25/2014 and 05/19/2008.  FINDINGS: A dual-chamber pacemaker has been placed. The leads terminate in the right atrium and right ventricle. There is no pneumothorax. The heart size is normal. Atherosclerotic changes are noted in the aorta.  Emphysematous changes of lungs are again seen. Left-sided nodules are present. Pleural thickening at the left apex is stable. A granuloma at the right base is stable. No acute superimposed disease is present. Moderate osteopenia is present. The visualized soft tissues and bony thorax are otherwise unremarkable.  IMPRESSION: 1. Interval placement of dual-chamber pacemaker without radiographic evidence for complication. 2. Severe emphysema without significant change. 3. Stable bilateral pulmonary nodules and left greater than right apical pleural thickening.   Electronically Signed   By: San Morelle M.D.   On: 09/27/2014 11:46   Dg Chest 2 View  09/25/2014   CLINICAL DATA:  81 year old female with fall and chest pain.  EXAM: CHEST  2 VIEW  COMPARISON:  Chest radiograph dated 07/01/2014 and chest CT dated 08/12/2005 .  FINDINGS: Two views of the chest demonstrate emphysematous changes of the lungs. There is no focal consolidation. No pleural effusion or pneumothorax. Multiple bilateral pulmonary nodules similar to the prior study most compatible with metastatic disease. Left upper lobe pleural-based density similar to prior study likely corresponding to the pleural based mass seen on the CT dated 08/12/2005. The cardiomediastinal silhouette is within normal limits. There is osteopenia. Degenerative changes of the spine.  IMPRESSION: No  definite acute/traumatic intrathoracic pathology.  Emphysema.  No focal consolidation or pneumothorax.  Multiple bilateral pulmonary nodules grossly similar to the prior study and concerning for metastatic disease.   Electronically Signed   By: Anner Crete M.D.   On: 09/25/2014 15:09   Dg Lumbar Spine Complete  09/25/2014   CLINICAL DATA:  81 year old female with a history of fall and lower back pain  EXAM: LUMBAR SPINE - COMPLETE 4+ VIEW  COMPARISON:  06/28/2013, 08/07/2009  FINDINGS: Apex right scoliotic curvature of the lumbar spine, centered at L3. There is associated multilevel degenerative disc disease and facet disease.  Diffuse osteopenia.  Relative maintenance of the anatomic alignment on the lateral view. There is mild retrolisthesis of L1 on L2 and L2 on L3, as well as mild anterolisthesis of L4 on L5. This may be projectional given the scoliotic curvature.  Vertebral body heights maintained, similar to the comparison.  No fracture line identified.  Dense atherosclerotic calcifications of the abdomen and pelvis.  IMPRESSION: No radiographic evidence of acute fracture or malalignment of the lumbar spine, however, osteopenia limits evaluation for nondisplaced fractures, and if there is ongoing concern for acute injury, MRI may be considered as a more sensitive test.  Scoliotic curvature with associated degenerative changes.  Atherosclerosis.  Signed,  Dulcy Fanny. Earleen Newport, DO  [facility]  [facility]   Electronically Signed   By: Corrie Mckusick D.O.   On: 09/25/2014 15:05   Ct Head Wo Contrast  09/25/2014   CLINICAL DATA:  Syncopal episode. Unknown period of loss of consciousness. Fell earlier today. Patient does not complain of headache.  EXAM: CT HEAD WITHOUT CONTRAST  TECHNIQUE: Contiguous axial images were obtained from the base of the skull through the vertex without intravenous contrast.  COMPARISON:  None.  FINDINGS: Global atrophy with small  vessel disease. No acute stroke or hemorrhage. No mass lesion or extra-axial fluid. Hydrocephalus ex vacuo.  There is no visible skull fracture or worrisome osseous lesion. No significant scalp hematoma.  Moderately advanced vascular calcification. Paranasal sinuses show no significant fluid accumulation.  IMPRESSION: Chronic changes as described.  No acute intracranial findings.   Electronically Signed   By: Staci Righter M.D.   On: 09/25/2014 15:27    ASSESSMENT/PLAN:  Physical deconditioning - for rehabilitation  Tachybradycardia syndrome - S/P Pacemaker placement 7/2; follow-up with Dr. Cristopher Peru, cardiology  Atrial fibrillation - not a candidate for anticoagulation due to falls, continue aspirin 81 mg 1 tab by mouth daily, Toprol XL 25 mg 1 tab by mouth daily and Tiazac 240 mg 24 hour 1 capsule by mouth daily  Protein calorie malnutrition, severe - albumin 3.1; continue supplementation  Solitary pulmonary nodule - not a candidate for surgery so no further evaluation was done  Essential hypertension - continue Tiazac 240 mg 24 hour 1 capsule by mouth daily and Cozaar 25 mg by mouth everyday  Allergic rhinitis - continue Zyrtec 10 mg 1 tab by mouth every morning  Constipation - continue MiraLAX 17 g by mouth daily when necessary  Hypokalemia -  K 5.4; currently on K-dur 20 meq 2 tabs = 40 meq BID; discontinue K-dur    Goals of care:  Short-term rehabilitation   Labs/test ordered:  CBC and BMP  Spent 50 minutes in patient care.    [facility], NP [employer] [phone]

## 2014-10-03 ENCOUNTER — Non-Acute Institutional Stay (SKILLED_NURSING_FACILITY): Payer: Medicare Other | Admitting: Internal Medicine

## 2014-10-03 DIAGNOSIS — K5900 Constipation, unspecified: Secondary | ICD-10-CM | POA: Diagnosis not present

## 2014-10-03 DIAGNOSIS — I495 Sick sinus syndrome: Secondary | ICD-10-CM

## 2014-10-03 DIAGNOSIS — M8580 Other specified disorders of bone density and structure, unspecified site: Secondary | ICD-10-CM

## 2014-10-03 DIAGNOSIS — M545 Low back pain, unspecified: Secondary | ICD-10-CM

## 2014-10-03 DIAGNOSIS — I10 Essential (primary) hypertension: Secondary | ICD-10-CM

## 2014-10-03 DIAGNOSIS — J309 Allergic rhinitis, unspecified: Secondary | ICD-10-CM | POA: Diagnosis not present

## 2014-10-03 DIAGNOSIS — R5381 Other malaise: Secondary | ICD-10-CM

## 2014-10-03 DIAGNOSIS — E46 Unspecified protein-calorie malnutrition: Secondary | ICD-10-CM

## 2014-10-03 DIAGNOSIS — E875 Hyperkalemia: Secondary | ICD-10-CM | POA: Diagnosis not present

## 2014-10-03 DIAGNOSIS — I4891 Unspecified atrial fibrillation: Secondary | ICD-10-CM

## 2014-10-03 NOTE — Progress Notes (Signed)
Patient ID: Beth [name], female   DOB: 09/30/1923, 81 y.o.   MRN: [id number]     [facility]   PCP: Penni Homans, MD  Code Status: full code  Allergies  Allergen Reactions  . Ciprofloxacin Other (See Comments)    myalgias  . Fexofenadine Other (See Comments)    unknown  . Penicillins Other (See Comments)    Chief Complaint  Patient presents with  . New Admit To SNF     HPI:  81 year old patient is here for short term rehabilitation post hospital admission from 09/25/14-09/30/14 with fall post syncope. She was diagnosed to have sinus tachy-brady syndrome. She was started on iv cardizem and then had a pacemaker placed on 09/27/14. She has PMH of atrial fibrillation, solitary pulmonary nodule, hypertension, vertigo, multiple falls secondary to Atrial fibrillation, osteoporosis, severe protein calorie malnutrition, aortic insufficiency and pulmonary hypertension. She is seen in her room today. She complaints of right hip and right lower back pain. Denies any radiation. She was getting pain medication in the hospital but is currently not on any pain medication. Denies any other concerns. No new concern from staff,  Review of Systems:  Constitutional: Negative for fever, chills, diaphoresis.  HENT: Negative for headache, congestion, nasal discharge Eyes: Negative for eye pain, blurred vision, double vision and discharge.  Respiratory: Negative for cough, shortness of breath and wheezing.   Cardiovascular: Negative for chest pain, palpitations, leg swelling.  Gastrointestinal: Negative for heartburn, nausea, vomiting, abdominal pain. Had bowel movement yesterday Genitourinary: Negative for dysuria and flank pain.  Musculoskeletal: Negative for falls in facility Skin: Negative for itching, rash.  Neurological: Negative for dizziness, tingling, focal weakness Psychiatric/Behavioral: Negative for depression   Past Medical History  Diagnosis Date  .  Hypertension   . Lung nodule     CT of chest 2007-left loewr lobar mass- evaluated by pulmonary- patient refused further work up  . Tobacco abuse   . Vertigo   . Abdominal pain, unspecified site 08/17/2012  . Other and unspecified hyperlipidemia 08/17/2012  . Loss of weight 01/19/2013  . Allergic state 06/22/2013  . Allergy   . Osteoporosis   . Syncope     fall  . Increased urinary frequency 01/02/2014  . Glaucoma 07/06/2014   Past Surgical History  Procedure Laterality Date  . Cholecystectomy    . Cesarean section    . Hernia repair    . Abdominal hysterectomy    . Ep implantable device N/A 09/26/2014    Procedure: Pacemaker Implant;  Surgeon: Evans Lance, MD;  Location: [place] [facility];  Service: Cardiovascular;  Laterality: N/A;   Social History:   reports that she has been smoking Cigarettes.  She has a 6 pack-year smoking history. She has never used smokeless tobacco. She reports that she does not drink alcohol or use illicit drugs.  Family History  Problem Relation Age of Onset  . Heart disease Mother   . Hypertension Mother   . Diabetes Mother   . Diabetes    . Cancer      lung- in distant releatives  . Cancer Brother     Medications: Patient's Medications  New Prescriptions   No medications on file  Previous Medications   ASPIRIN 81 MG CHEWABLE TABLET    Chew 1 tablet (81 mg total) by mouth daily.   CETIRIZINE (ZYRTEC) 10 MG TABLET    Take 10 mg by mouth every morning.   DILTIAZEM (TIAZAC) 240  MG 24 HR CAPSULE    TAKE 1 CAPSULE (240 MG TOTAL) BY MOUTH DAILY.   FISH OIL-OMEGA-3 FATTY ACIDS 1000 MG CAPSULE    Take 2 g by mouth daily at 12 noon.    FLECAINIDE (TAMBOCOR) 50 MG TABLET    Take 1 tablet (50 mg total) by mouth every 12 (twelve) hours.   LOSARTAN (COZAAR) 25 MG TABLET    Take 1 tablet (25 mg total) by mouth daily.   METOPROLOL SUCCINATE (TOPROL-XL) 25 MG 24 HR TABLET    TAKE 1 TABLET (25 MG TOTAL) BY MOUTH DAILY.   MULTIPLE VITAMIN (MULTIVITAMIN)  TABLET    Take 1 tablet by mouth daily at 12 noon.    POLYETHYLENE GLYCOL (MIRALAX / GLYCOLAX) PACKET    Take 17 g by mouth daily as needed.   SALINE (SODIUM CHLORIDE) 0.65 % SOLN    Place 1 spray into the nose as needed for congestion. 2 sprays each nostril once daily as needed   TRIMETHOPRIM-POLYMYXIN B (POLYTRIM) OPHTHALMIC SOLUTION    Place 2 drops into the left eye every 6 (six) hours.  Modified Medications   No medications on file  Discontinued Medications   FLUCONAZOLE (DIFLUCAN) 100 MG TABLET    Take 1 tablet (100 mg total) by mouth daily.   POTASSIUM CHLORIDE SA (K-DUR,KLOR-CON) 20 MEQ TABLET    Take 2 tablets (40 mEq total) by mouth 2 (two) times daily.     Physical Exam: Filed Vitals:   10/03/14 1158  BP: 124/51  Pulse: 82  Temp: 97.6 F (36.4 C)  Resp: 14  Weight: 102 lb 6.4 oz (46.448 kg)  SpO2: 95%    General- elderly female, thin built, in no acute distress Head- normocephalic, atraumatic Throat- moist mucus membrane Eyes- PERRLA, EOMI, no pallor, no icterus Neck- no cervical lymphadenopathy, no jugular vein distension Cardiovascular- normal s1,s2, no murmurs, palpable dorsalis pedis, no leg edema Respiratory- bilateral clear to auscultation, no wheeze, no rhonchi, no crackles, no use of accessory muscles Abdomen- bowel sounds present, soft, non tender Musculoskeletal- able to move all 4 extremities, generalized weakness Neurological- no focal deficit Skin- warm and dry, left chest wall with pacemaker area covered with steristrips, slight bruising around the area Psychiatry- alert and oriented to person, place and time, normal mood and affect    Labs reviewed: Basic Metabolic Panel:  Recent Labs  01/02/14 0952  09/27/14 0425 09/28/14 1956 09/29/14 0421  NA 141  < > 135 130* 135  K 4.5  < > 2.9* 5.4* 5.4*  CL 106  < > 103 103 107  CO2 26  < > 21* 21* 24  GLUCOSE 86  < > 135* 130* 108*  BUN 10  < > 9 22* 21*  CREATININE 0.6  < > 0.55 1.00 0.88    CALCIUM 9.4  < > 8.4* 8.7* 9.0  MG  --   --  1.8  --   --   PHOS 3.4  --   --   --   --   < > = values in this interval not displayed. Liver Function Tests:  Recent Labs  09/25/14 1440 09/26/14 0501 09/27/14 0425  AST 27 21 20   ALT 19 17 16   ALKPHOS 63 64 61  BILITOT 0.6 0.7 0.8  PROT 6.8 6.6 6.3*  ALBUMIN 3.6 3.4* 3.1*   No results for input(s): LIPASE, AMYLASE in the last 8760 hours. No results for input(s): AMMONIA in the last 8760 hours. CBC:  Recent Labs  09/25/14 1440 09/26/14 0501 09/27/14 0425 09/28/14 0355  WBC 10.2 7.8 7.9 8.7  NEUTROABS 8.9*  --   --   --   HGB 14.1 13.6 13.2 13.0  HCT 40.4 39.8 38.1 38.0  MCV 86.7 87.7 86.2 87.2  PLT 259 242 197 223   Cardiac Enzymes:  Recent Labs  09/25/14 1440 09/25/14 1748 09/25/14 2234 09/26/14 0501  CKTOTAL 85  --   --   --   TROPONINI  --  <0.03 <0.03 <0.03    Radiological Exams:  Dg Chest 2 View  09/27/2014   CLINICAL DATA:  Back pain.  Fall 2 days ago.  Pacemaker.  EXAM: CHEST - 2 VIEW  COMPARISON:  Two-view chest x-ray 09/25/2014 and 05/19/2008.  FINDINGS: A dual-chamber pacemaker has been placed. The leads terminate in the right atrium and right ventricle. There is no pneumothorax. The heart size is normal. Atherosclerotic changes are noted in the aorta.  Emphysematous changes of lungs are again seen. Left-sided nodules are present. Pleural thickening at the left apex is stable. A granuloma at the right base is stable. No acute superimposed disease is present. Moderate osteopenia is present. The visualized soft tissues and bony thorax are otherwise unremarkable.  IMPRESSION: 1. Interval placement of dual-chamber pacemaker without radiographic evidence for complication. 2. Severe emphysema without significant change. 3. Stable bilateral pulmonary nodules and left greater than right apical pleural thickening.   Electronically Signed   By: San Morelle M.D.   On: 09/27/2014 11:46   Dg Chest 2  View  09/25/2014   CLINICAL DATA:  81 year old female with fall and chest pain.  EXAM: CHEST  2 VIEW  COMPARISON:  Chest radiograph dated 07/01/2014 and chest CT dated 08/12/2005 .  FINDINGS: Two views of the chest demonstrate emphysematous changes of the lungs. There is no focal consolidation. No pleural effusion or pneumothorax. Multiple bilateral pulmonary nodules similar to the prior study most compatible with metastatic disease. Left upper lobe pleural-based density similar to prior study likely corresponding to the pleural based mass seen on the CT dated 08/12/2005. The cardiomediastinal silhouette is within normal limits. There is osteopenia. Degenerative changes of the spine.  IMPRESSION: No definite acute/traumatic intrathoracic pathology.  Emphysema.  No focal consolidation or pneumothorax.  Multiple bilateral pulmonary nodules grossly similar to the prior study and concerning for metastatic disease.   Electronically Signed   By: Anner Crete M.D.   On: 09/25/2014 15:09   Dg Lumbar Spine Complete  09/25/2014   CLINICAL DATA:  81 year old female with a history of fall and lower back pain  EXAM: LUMBAR SPINE - COMPLETE 4+ VIEW  COMPARISON:  06/28/2013, 08/07/2009  FINDINGS: Apex right scoliotic curvature of the lumbar spine, centered at L3. There is associated multilevel degenerative disc disease and facet disease.  Diffuse osteopenia.  Relative maintenance of the anatomic alignment on the lateral view. There is mild retrolisthesis of L1 on L2 and L2 on L3, as well as mild anterolisthesis of L4 on L5. This may be projectional given the scoliotic curvature.  Vertebral body heights maintained, similar to the comparison.  No fracture line identified.  Dense atherosclerotic calcifications of the abdomen and pelvis.  IMPRESSION: No radiographic evidence of acute fracture or malalignment of the lumbar spine, however, osteopenia limits evaluation for nondisplaced fractures, and if there is ongoing concern  for acute injury, MRI may be considered as a more sensitive test.  Scoliotic curvature with associated degenerative changes.  Atherosclerosis.  Signed,  Dulcy Fanny.  Earleen Newport, DO  [facility]  [facility]   Electronically Signed   By: Corrie Mckusick D.O.   On: 09/25/2014 15:05   Ct Head Wo Contrast  09/25/2014   CLINICAL DATA:  Syncopal episode. Unknown period of loss of consciousness. Fell earlier today. Patient does not complain of headache.  EXAM: CT HEAD WITHOUT CONTRAST  TECHNIQUE: Contiguous axial images were obtained from the base of the skull through the vertex without intravenous contrast.  COMPARISON:  None.  FINDINGS: Global atrophy with small vessel disease. No acute stroke or hemorrhage. No mass lesion or extra-axial fluid. Hydrocephalus ex vacuo.  There is no visible skull fracture or worrisome osseous lesion. No significant scalp hematoma.  Moderately advanced vascular calcification. Paranasal sinuses show no significant fluid accumulation.  IMPRESSION: Chronic changes as described.  No acute intracranial findings.   Electronically Signed   By: Staci Righter M.D.   On: 09/25/2014 15:27    Assessment/Plan  Physical deconditioning Will have her work with physical therapy and occupational therapy team to help with gait training and muscle strengthening exercises.fall precautions. Skin care. Encourage to be out of bed.   Tachybradycardia syndrome S/P Pacemaker placement 7/2. Has follow up with cardiology  Atrial fibrillation  Rate controlled. Continue toprol xl 25 mg daily with diltiazem 240 mg daily and flecainide 50 mg bid. Off anticoagulation with high fall risk. Continue aspirin 81 mg daily.  Right lower back pain Workup in hospital ruled out fracture but showed osteopenia. Was getting flector patch and percocet in the hospital which was not transferred to discharge summary for unclear reason. Pt is not receiving any pain medication at present.  Restart flector patch 1.3% bid to the affected area and give tylenol 500 mg 2 tab q8h prn for pain and reassess  Protein calorie malnutrition Encouraged po intake. RD consult. monitor weight.  Essential hypertension  Stable bp reading. DBP on lower side today. Monitor bp q shift, continue cozaar 25 mg daily and toprol xl 25 mg daily  Hyperkalemia Off kdur supplement, monitor bmp  Allergic rhinitis Stable, continue Zyrtec 10 mg daily  Constipation Stable, continue prn miralax  Osteopenia Start oscal 600-400 1 tab bid for now   Goals of care: short term rehabilitation    Labs/tests ordered: bmp  Family/ staff Communication: reviewed care plan with patient and nursing supervisor    Blanchie Serve, MD  [facility] [phone] (Monday-Friday 8 am - 5 pm) [phone] (afterhours)

## 2014-10-06 ENCOUNTER — Ambulatory Visit (INDEPENDENT_AMBULATORY_CARE_PROVIDER_SITE_OTHER): Payer: Medicare Other | Admitting: *Deleted

## 2014-10-06 DIAGNOSIS — I495 Sick sinus syndrome: Secondary | ICD-10-CM | POA: Diagnosis not present

## 2014-10-06 LAB — CUP PACEART INCLINIC DEVICE CHECK
Battery Remaining Longevity: 140 mo
Battery Voltage: 2.8 V
Brady Statistic AS VP Percent: 0 %
Brady Statistic AS VS Percent: 44 %
Date Time Interrogation Session: 20160711172255
Lead Channel Pacing Threshold Amplitude: 0.75 V
Lead Channel Pacing Threshold Pulse Width: 0.4 ms
MDC IDC MSMT BATTERY IMPEDANCE: 100 Ohm
MDC IDC MSMT LEADCHNL RA IMPEDANCE VALUE: 489 Ohm
MDC IDC MSMT LEADCHNL RV IMPEDANCE VALUE: 574 Ohm
MDC IDC MSMT LEADCHNL RV PACING THRESHOLD AMPLITUDE: 0.75 V
MDC IDC MSMT LEADCHNL RV PACING THRESHOLD PULSEWIDTH: 0.4 ms
MDC IDC MSMT LEADCHNL RV SENSING INTR AMPL: 11.2 mV
MDC IDC SET LEADCHNL RA PACING AMPLITUDE: 3.5 V
MDC IDC SET LEADCHNL RV PACING AMPLITUDE: 3.5 V
MDC IDC SET LEADCHNL RV PACING PULSEWIDTH: 0.4 ms
MDC IDC SET LEADCHNL RV SENSING SENSITIVITY: 4 mV
MDC IDC STAT BRADY AP VP PERCENT: 0 %
MDC IDC STAT BRADY AP VS PERCENT: 55 %

## 2014-10-06 NOTE — Progress Notes (Signed)
Wound check appointment. Steri-strips removed. Wound without redness or edema. Incision edges approximated, wound well healed. Normal device function. Thresholds, sensing, and impedances consistent with implant measurements. Device programmed at 3.5V/auto capture programmed on for extra safety margin until 3 month visit. Histogram distribution appropriate for patient and level of activity. 4 mode switches- longest 3:10:51, pk A >400, pk V 192 (+81mg  ASA). No high ventricular rates noted. Patient educated about wound care, arm mobility, lifting restrictions. ROV with GT 12/23/14.

## 2014-10-21 ENCOUNTER — Encounter: Payer: Self-pay | Admitting: Adult Health

## 2014-10-21 DIAGNOSIS — R911 Solitary pulmonary nodule: Secondary | ICD-10-CM

## 2014-10-21 DIAGNOSIS — M545 Low back pain, unspecified: Secondary | ICD-10-CM

## 2014-10-21 DIAGNOSIS — I495 Sick sinus syndrome: Secondary | ICD-10-CM

## 2014-10-21 DIAGNOSIS — J309 Allergic rhinitis, unspecified: Secondary | ICD-10-CM

## 2014-10-21 DIAGNOSIS — K5900 Constipation, unspecified: Secondary | ICD-10-CM

## 2014-10-21 DIAGNOSIS — E46 Unspecified protein-calorie malnutrition: Secondary | ICD-10-CM

## 2014-10-21 DIAGNOSIS — I4891 Unspecified atrial fibrillation: Secondary | ICD-10-CM

## 2014-10-21 DIAGNOSIS — I10 Essential (primary) hypertension: Secondary | ICD-10-CM

## 2014-10-21 DIAGNOSIS — R5381 Other malaise: Secondary | ICD-10-CM

## 2014-10-21 NOTE — Progress Notes (Signed)
Patient ID: Beth [name], female   DOB: [birth year], 81 y.o.   MRN: [id number]   10/21/2014  Facility:  Nursing Home Location:  [place] Room Number: 706-P LEVEL OF CARE:  SNF (31)  Chief Complaint  Patient presents with  . Discharge Note    Physical deconditioning, tachybradycardia syndrome, atrial fibrillation, protein calorie malnutrition, solitary pulmonary nodule, essential hypertension, allergic rhinitis, constipation and low back pain     HISTORY OF PRESENT ILLNESS:  This is an 81 year old female who is for discharge home  She has been admitted to [facility] on 09/30/14 from [facility]. She has PMH of atrial fibrillation, solitary pulmonary nodule, hypertension, vertigo, multiple falls secondary to Atrial fibrillation, presbycusis, osteoporosis, prior rib fractures after falls, severe protein cholerae malnutrition, I aortic insufficiency and pulmonary hypertension. She had a syncopal episode resulting in a fall. She was diagnosed to have atrial fibrillation with RVR with rates in the 200s/180s, was given IV Cardizem and converted to sinus rhythm. She had pacemaker placement on 7/2.  Patient was admitted to this facility for short-term rehabilitation after the patient's recent hospitalization.  Patient has completed SNF rehabilitation and therapy has cleared the patient for discharge.  PAST MEDICAL HISTORY:  Past Medical History  Diagnosis Date  . Hypertension   . Lung nodule     CT of chest 2007-left loewr lobar mass- evaluated by pulmonary- patient refused further work up  . Tobacco abuse   . Vertigo   . Abdominal pain, unspecified site 08/17/2012  . Other and unspecified hyperlipidemia 08/17/2012  . Loss of weight 01/19/2013  . Allergic state 06/22/2013  . Allergy   . Osteoporosis   . Syncope     fall  . Increased urinary frequency 01/02/2014  . Glaucoma 07/06/2014    CURRENT MEDICATIONS: Reviewed per MAR/see medication  list  Allergies  Allergen Reactions  . Ciprofloxacin Other (See Comments)    myalgias  . Fexofenadine Other (See Comments)    unknown  . Penicillins Other (See Comments)     REVIEW OF SYSTEMS:  GENERAL: no change in appetite, no fatigue, no weight changes, no fever, chills or weakness RESPIRATORY: no cough, SOB, DOE, wheezing, hemoptysis CARDIAC: no chest pain, edema or palpitations GI: no abdominal pain, diarrhea, constipation, heart burn, nausea or vomiting  PHYSICAL EXAMINATION  GENERAL: no acute distress, normal body habitus SKIN:  Left chest pacemaker surgical site is healed  NECK: supple, trachea midline, no neck masses, no thyroid tenderness, no thyromegaly LYMPHATICS: no LAN in the neck, no supraclavicular LAN RESPIRATORY: breathing is even & unlabored, BS CTAB CARDIAC: RRR, no murmur,no extra heart sounds, no edema GI: abdomen soft, normal BS, no masses, no tenderness, no hepatomegaly, no splenomegaly EXTREMITIES:  Able to move 4 extremities PSYCHIATRIC: the patient is alert & oriented to person, affect & behavior appropriate  LABS/RADIOLOGY: Labs reviewed: 10/02/14  WBC 7.3 hemoglobin 18.4 hematocrit 39.2 MCV 86.7 platelet 234 sodium 140 potassium 4.7 glucose 83 BUN 14 creatinine 0.62 calcium 9.1 Basic Metabolic Panel:  Recent Labs  01/02/14 0952  09/27/14 0425 09/28/14 1956 09/29/14 0421  NA 141  < > 135 130* 135  K 4.5  < > 2.9* 5.4* 5.4*  CL 106  < > 103 103 107  CO2 26  < > 21* 21* 24  GLUCOSE 86  < > 135* 130* 108*  BUN 10  < > 9 22* 21*  CREATININE 0.6  < > 0.55 1.00 0.88  CALCIUM  9.4  < > 8.4* 8.7* 9.0  MG  --   --  1.8  --   --   PHOS 3.4  --   --   --   --   < > = values in this interval not displayed. Liver Function Tests:  Recent Labs  09/25/14 1440 09/26/14 0501 09/27/14 0425  AST 27 21 20   ALT 19 17 16   ALKPHOS 63 64 61  BILITOT 0.6 0.7 0.8  PROT 6.8 6.6 6.3*  ALBUMIN 3.6 3.4* 3.1*   CBC:  Recent Labs  09/25/14 1440  09/26/14 0501 09/27/14 0425 09/28/14 0355  WBC 10.2 7.8 7.9 8.7  NEUTROABS 8.9*  --   --   --   HGB 14.1 13.6 13.2 13.0  HCT 40.4 39.8 38.1 38.0  MCV 86.7 87.7 86.2 87.2  PLT 259 242 197 223   Lipid Panel:  Recent Labs  08/28/14 1505  HDL 66.90   Cardiac Enzymes:  Recent Labs  09/25/14 1440 09/25/14 1748 09/25/14 2234 09/26/14 0501  CKTOTAL 85  --   --   --   TROPONINI  --  <0.03 <0.03 <0.03     Dg Chest 2 View  09/27/2014   CLINICAL DATA:  Back pain.  Fall 2 days ago.  Pacemaker.  EXAM: CHEST - 2 VIEW  COMPARISON:  Two-view chest x-ray 09/25/2014 and 05/19/2008.  FINDINGS: A dual-chamber pacemaker has been placed. The leads terminate in the right atrium and right ventricle. There is no pneumothorax. The heart size is normal. Atherosclerotic changes are noted in the aorta.  Emphysematous changes of lungs are again seen. Left-sided nodules are present. Pleural thickening at the left apex is stable. A granuloma at the right base is stable. No acute superimposed disease is present. Moderate osteopenia is present. The visualized soft tissues and bony thorax are otherwise unremarkable.  IMPRESSION: 1. Interval placement of dual-chamber pacemaker without radiographic evidence for complication. 2. Severe emphysema without significant change. 3. Stable bilateral pulmonary nodules and left greater than right apical pleural thickening.   Electronically Signed   By: San Morelle M.D.   On: 09/27/2014 11:46   Dg Chest 2 View  09/25/2014   CLINICAL DATA:  81 year old female with fall and chest pain.  EXAM: CHEST  2 VIEW  COMPARISON:  Chest radiograph dated 07/01/2014 and chest CT dated 08/12/2005 .  FINDINGS: Two views of the chest demonstrate emphysematous changes of the lungs. There is no focal consolidation. No pleural effusion or pneumothorax. Multiple bilateral pulmonary nodules similar to the prior study most compatible with metastatic disease. Left upper lobe pleural-based density  similar to prior study likely corresponding to the pleural based mass seen on the CT dated 08/12/2005. The cardiomediastinal silhouette is within normal limits. There is osteopenia. Degenerative changes of the spine.  IMPRESSION: No definite acute/traumatic intrathoracic pathology.  Emphysema.  No focal consolidation or pneumothorax.  Multiple bilateral pulmonary nodules grossly similar to the prior study and concerning for metastatic disease.   Electronically Signed   By: Anner Crete M.D.   On: 09/25/2014 15:09   Dg Lumbar Spine Complete  09/25/2014   CLINICAL DATA:  81 year old female with a history of fall and lower back pain  EXAM: LUMBAR SPINE - COMPLETE 4+ VIEW  COMPARISON:  06/28/2013, 08/07/2009  FINDINGS: Apex right scoliotic curvature of the lumbar spine, centered at L3. There is associated multilevel degenerative disc disease and facet disease.  Diffuse osteopenia.  Relative maintenance of the anatomic alignment on  the lateral view. There is mild retrolisthesis of L1 on L2 and L2 on L3, as well as mild anterolisthesis of L4 on L5. This may be projectional given the scoliotic curvature.  Vertebral body heights maintained, similar to the comparison.  No fracture line identified.  Dense atherosclerotic calcifications of the abdomen and pelvis.  IMPRESSION: No radiographic evidence of acute fracture or malalignment of the lumbar spine, however, osteopenia limits evaluation for nondisplaced fractures, and if there is ongoing concern for acute injury, MRI may be considered as a more sensitive test.  Scoliotic curvature with associated degenerative changes.  Atherosclerosis.  Signed,  Dulcy Fanny. Earleen Newport, DO  [facility]  [facility]   Electronically Signed   By: Corrie Mckusick D.O.   On: 09/25/2014 15:05   Ct Head Wo Contrast  09/25/2014   CLINICAL DATA:  Syncopal episode. Unknown period of loss of consciousness. Fell earlier today. Patient does not complain  of headache.  EXAM: CT HEAD WITHOUT CONTRAST  TECHNIQUE: Contiguous axial images were obtained from the base of the skull through the vertex without intravenous contrast.  COMPARISON:  None.  FINDINGS: Global atrophy with small vessel disease. No acute stroke or hemorrhage. No mass lesion or extra-axial fluid. Hydrocephalus ex vacuo.  There is no visible skull fracture or worrisome osseous lesion. No significant scalp hematoma.  Moderately advanced vascular calcification. Paranasal sinuses show no significant fluid accumulation.  IMPRESSION: Chronic changes as described.  No acute intracranial findings.   Electronically Signed   By: Staci Righter M.D.   On: 09/25/2014 15:27    ASSESSMENT/PLAN:  Physical deconditioning - for   Tachybradycardia syndrome - S/P Pacemaker placement 7/2; follow-up with Dr. Cristopher Peru, cardiology  Atrial fibrillation - not a candidate for anticoagulation due to falls, continue aspirin 81 mg 1 tab by mouth daily, Toprol XL 25 mg 1 tab by mouth daily and Tiazac 240 mg 24 hour 1 capsule by mouth daily  Protein calorie malnutrition, severe - albumin 3.1; continue supplementation  Solitary pulmonary nodule - not a candidate for surgery so no further evaluation was done  Essential hypertension - continue Tiazac 240 mg 24 hour 1 capsule by mouth daily and Cozaar 25 mg by mouth everyday  Allergic rhinitis - continue Zyrtec 10 mg 1 tab by mouth every morning  Constipation - continue MiraLAX 17 g by mouth daily when necessary  Right-sided low back pain - continue Flector patch 1.3% to right lower back area BID and Tylenol 500 mg 2 tabs PO Q 8 hours PRN     I have filled out patient's discharge paperwork and written prescriptions.  Patient will receive home health PT, OT, ST, nursing and CNA.  DME provided:  Total discharge time: Greater/less than 30 minutes  Discharge time involved coordination of the discharge process with Education officer, museum, nursing staff and therapy  department. Medical justification for home health services/DME verified.     [facility], [place] [phone]  This encounter was created in error - please disregard.

## 2014-10-27 ENCOUNTER — Non-Acute Institutional Stay (SKILLED_NURSING_FACILITY): Payer: Medicare Other | Admitting: Adult Health

## 2014-10-27 ENCOUNTER — Telehealth: Payer: Self-pay | Admitting: Family Medicine

## 2014-10-27 ENCOUNTER — Encounter: Payer: Self-pay | Admitting: Adult Health

## 2014-10-27 DIAGNOSIS — I495 Sick sinus syndrome: Secondary | ICD-10-CM | POA: Diagnosis not present

## 2014-10-27 DIAGNOSIS — I4891 Unspecified atrial fibrillation: Secondary | ICD-10-CM

## 2014-10-27 DIAGNOSIS — K5900 Constipation, unspecified: Secondary | ICD-10-CM

## 2014-10-27 DIAGNOSIS — R5381 Other malaise: Secondary | ICD-10-CM

## 2014-10-27 DIAGNOSIS — M810 Age-related osteoporosis without current pathological fracture: Secondary | ICD-10-CM | POA: Diagnosis not present

## 2014-10-27 DIAGNOSIS — J309 Allergic rhinitis, unspecified: Secondary | ICD-10-CM | POA: Diagnosis not present

## 2014-10-27 DIAGNOSIS — E43 Unspecified severe protein-calorie malnutrition: Secondary | ICD-10-CM | POA: Diagnosis not present

## 2014-10-27 DIAGNOSIS — I10 Essential (primary) hypertension: Secondary | ICD-10-CM | POA: Diagnosis not present

## 2014-10-27 DIAGNOSIS — R911 Solitary pulmonary nodule: Secondary | ICD-10-CM | POA: Diagnosis not present

## 2014-10-27 NOTE — Telephone Encounter (Signed)
Caller name: Lattie Haw with [facility] Can be reached: Call [facility] LPN with verbal order at [phone] Pharmacy:  Reason for call: Needs verbal order for Home Health. Pt dc from [facility] 10/28/14.

## 2014-10-27 NOTE — Progress Notes (Signed)
Patient ID: Beth [name], female   DOB: Jan 23, 1924, 81 y.o.   MRN: [id number]   10/27/2014  Facility:  Nursing Home Location:  [place] Room Number: 706-P LEVEL OF CARE:  SNF (31)  Chief Complaint  Patient presents with  . Discharge Note    Physical deconditioning, tachybradycardia syndrome, atrial fibrillation, protein calorie malnutrition, solitary pulmonary nodule, essential hypertension, allergic rhinitis, constipation and low back pain     HISTORY OF PRESENT ILLNESS:  This is an 81 year old female who is for discharge home with Home health PT for endurance, OT for ADLs and CNA for showers. DME: standard rolling walker. She has been admitted to [facility] on 09/30/14 from [facility]. She has PMH of atrial fibrillation, solitary pulmonary nodule, hypertension, vertigo, multiple falls secondary to Atrial fibrillation, presbycusis, osteoporosis, prior rib fractures after falls, severe protein calorie malnutrition,  aortic insufficiency and pulmonary hypertension. She had a syncopal episode resulting in a fall. She was diagnosed to have atrial fibrillation with RVR with rates in the 200s/180s, was given IV Cardizem and converted to sinus rhythm. She had pacemaker placement on 7/2.  Patient was admitted to this facility for short-term rehabilitation after the patient's recent hospitalization.  Patient has completed SNF rehabilitation and therapy has cleared the patient for discharge.  PAST MEDICAL HISTORY:  Past Medical History  Diagnosis Date  . Hypertension   . Lung nodule     CT of chest 2007-left loewr lobar mass- evaluated by pulmonary- patient refused further work up  . Tobacco abuse   . Vertigo   . Abdominal pain, unspecified site 08/17/2012  . Other and unspecified hyperlipidemia 08/17/2012  . Loss of weight 01/19/2013  . Allergic state 06/22/2013  . Allergy   . Osteoporosis   . Syncope     fall  . Increased urinary frequency  01/02/2014  . Glaucoma 07/06/2014    CURRENT MEDICATIONS: Reviewed per MAR/see medication list  Allergies  Allergen Reactions  . Ciprofloxacin Other (See Comments)    myalgias  . Fexofenadine Other (See Comments)    unknown  . Penicillins Other (See Comments)     REVIEW OF SYSTEMS:  GENERAL: no change in appetite, no fatigue, no weight changes, no fever, chills or weakness RESPIRATORY: no cough, SOB, DOE, wheezing, hemoptysis CARDIAC: no chest pain, edema or palpitations GI: no abdominal pain, diarrhea, constipation, heart burn, nausea or vomiting  PHYSICAL EXAMINATION  GENERAL: no acute distress, normal body habitus SKIN:  Left chest pacemaker surgical site is healed NECK: supple, trachea midline, no neck masses, no thyroid tenderness, no thyromegaly LYMPHATICS: no LAN in the neck, no supraclavicular LAN RESPIRATORY: breathing is even & unlabored, BS CTAB CARDIAC: RRR, no murmur,no extra heart sounds, no edema GI: abdomen soft, normal BS, no masses, no tenderness, no hepatomegaly, no splenomegaly EXTREMITIES:  Able to move 4 extremities PSYCHIATRIC: the patient is alert & oriented to person, affect & behavior appropriate  LABS/RADIOLOGY: Labs reviewed: 10/21/14  WBC 5.7 hemoglobin 11.0 hematocrit 32.4 MCV 86.6 platelet count 278 sodium 142 potassium 4.0 glucose 78 BUN 15 creatinine 0.56 calcium 8.8 10/02/14  WBC 7.3 hemoglobin 13.4 hematocrit 39.2 MCV 86.7 platelet 234 sodium 140 potassium 4.7 glucose 83 BUN 14 creatinine 0.62 calcium 9.1 Basic Metabolic Panel:  Recent Labs  01/02/14 0952  09/27/14 0425 09/28/14 1956 09/29/14 0421  NA 141  < > 135 130* 135  K 4.5  < > 2.9* 5.4* 5.4*  CL 106  < > 103  103 107  CO2 26  < > 21* 21* 24  GLUCOSE 86  < > 135* 130* 108*  BUN 10  < > 9 22* 21*  CREATININE 0.6  < > 0.55 1.00 0.88  CALCIUM 9.4  < > 8.4* 8.7* 9.0  MG  --   --  1.8  --   --   PHOS 3.4  --   --   --   --   < > = values in this interval not displayed. Liver  Function Tests:  Recent Labs  09/25/14 1440 09/26/14 0501 09/27/14 0425  AST 27 21 20   ALT 19 17 16   ALKPHOS 63 64 61  BILITOT 0.6 0.7 0.8  PROT 6.8 6.6 6.3*  ALBUMIN 3.6 3.4* 3.1*   CBC:  Recent Labs  09/25/14 1440 09/26/14 0501 09/27/14 0425 09/28/14 0355  WBC 10.2 7.8 7.9 8.7  NEUTROABS 8.9*  --   --   --   HGB 14.1 13.6 13.2 13.0  HCT 40.4 39.8 38.1 38.0  MCV 86.7 87.7 86.2 87.2  PLT 259 242 197 223   Lipid Panel:  Recent Labs  08/28/14 1505  HDL 66.90   Cardiac Enzymes:  Recent Labs  09/25/14 1440 09/25/14 1748 09/25/14 2234 09/26/14 0501  CKTOTAL 85  --   --   --   TROPONINI  --  <0.03 <0.03 <0.03    ASSESSMENT/PLAN:  Physical deconditioning - for home health PT, OT and CNA  Sinus tachy-bradycardia syndrome - S/P Pacemaker placement 7/2; follow-up with Dr. Cristopher Peru, cardiology  Atrial fibrillation - not a candidate for anticoagulation due to falls, continue aspirin 81 mg 1 tab by mouth daily, Toprol XL 25 mg 1 tab by mouth daily and Tiazac 240 mg 24 hour 1 capsule by mouth daily  Protein calorie malnutrition, severe - albumin 3.1; continue supplementation  Solitary pulmonary nodule - not a candidate for surgery so no further evaluation was done  Essential hypertension - continue Tiazac 240 mg 24 hour 1 capsule by mouth daily and Cozaar 25 mg by mouth everyday  Allergic rhinitis - continue Zyrtec 10 mg 1 tab by mouth every morning  Constipation - continue MiraLAX 17 g by mouth daily when necessary  Right-sided low back pain - continue Flector patch 1.3% to right lower back area BID and Tylenol 500 mg 2 tabs PO Q 8 hours PRN     I have filled out patient's discharge paperwork and written prescriptions.  Patient will receive home health PT, OT and CNA.  DME provided:  Standard rolling walker  Total discharge time: Greater than 30 minutes  Discharge time involved coordination of the discharge process with social worker, nursing staff  and therapy department. Medical justification for home health services/DME verified.    [facility], NP [employer] [phone]

## 2014-10-27 NOTE — Telephone Encounter (Signed)
OK to give verbal order for home health, for debility and clarify with them what else they need to have referral for. Then place

## 2014-10-28 NOTE — Telephone Encounter (Signed)
Called [facility] gave PCP verbal order.

## 2014-10-29 ENCOUNTER — Telehealth: Payer: Self-pay | Admitting: Family Medicine

## 2014-10-29 DIAGNOSIS — Z9181 History of falling: Secondary | ICD-10-CM | POA: Diagnosis not present

## 2014-10-29 DIAGNOSIS — Z720 Tobacco use: Secondary | ICD-10-CM | POA: Diagnosis not present

## 2014-10-29 DIAGNOSIS — M810 Age-related osteoporosis without current pathological fracture: Secondary | ICD-10-CM | POA: Diagnosis not present

## 2014-10-29 DIAGNOSIS — Z48812 Encounter for surgical aftercare following surgery on the circulatory system: Secondary | ICD-10-CM | POA: Diagnosis not present

## 2014-10-29 DIAGNOSIS — Z950 Presence of cardiac pacemaker: Secondary | ICD-10-CM | POA: Diagnosis not present

## 2014-10-29 DIAGNOSIS — I10 Essential (primary) hypertension: Secondary | ICD-10-CM | POA: Diagnosis not present

## 2014-10-29 DIAGNOSIS — I5032 Chronic diastolic (congestive) heart failure: Secondary | ICD-10-CM | POA: Diagnosis not present

## 2014-10-29 DIAGNOSIS — R911 Solitary pulmonary nodule: Secondary | ICD-10-CM | POA: Diagnosis not present

## 2014-10-29 DIAGNOSIS — I4891 Unspecified atrial fibrillation: Secondary | ICD-10-CM | POA: Diagnosis not present

## 2014-10-29 DIAGNOSIS — M545 Low back pain: Secondary | ICD-10-CM | POA: Diagnosis not present

## 2014-10-29 NOTE — Telephone Encounter (Signed)
I cn help with a Home Health referral before I see her. What are they hoping to get help with so I can place.

## 2014-10-29 NOTE — Telephone Encounter (Signed)
Caller name: Irene Pap   Relationship to patient: Daughter Can be reached: [phone] Pharmacy:  Reason for call: Pt's daughter called. She says that pt fell and now needs an home health aid. Daughter wants to know if her mom has to be seen before? I scheduled pt an appt for Monday 8/8 at 3:45 (the soonest appt available) do she need to be seen sooner?

## 2014-10-29 NOTE — Telephone Encounter (Signed)
Pt had a fall 6/30 and had a pacemaker placed. She was discharged from hospital and put into home health care [facility]) where she was discharged 8/2 and is now at home. Pt physical therapist feels the pt is in need of home care and will send the package for signature by Friday. Daughter would like to know if pt. Is in need of an appointment to have papers signed and if so can appointment be moved up to have that done asap. Please advise.

## 2014-10-30 DIAGNOSIS — M545 Low back pain: Secondary | ICD-10-CM | POA: Diagnosis not present

## 2014-10-30 DIAGNOSIS — I10 Essential (primary) hypertension: Secondary | ICD-10-CM | POA: Diagnosis not present

## 2014-10-30 DIAGNOSIS — M810 Age-related osteoporosis without current pathological fracture: Secondary | ICD-10-CM | POA: Diagnosis not present

## 2014-10-30 DIAGNOSIS — I5032 Chronic diastolic (congestive) heart failure: Secondary | ICD-10-CM | POA: Diagnosis not present

## 2014-10-30 DIAGNOSIS — Z48812 Encounter for surgical aftercare following surgery on the circulatory system: Secondary | ICD-10-CM | POA: Diagnosis not present

## 2014-10-30 DIAGNOSIS — I4891 Unspecified atrial fibrillation: Secondary | ICD-10-CM | POA: Diagnosis not present

## 2014-10-30 NOTE — Telephone Encounter (Signed)
The daughter stated they have no funds for out of pocket expenses.  States she thinks medicare will pay only for bathing/personal hygiene?? She also has BCBS supplemental, they do need light house work, meals, but no funds to pay or if insurance to cover.

## 2014-10-31 ENCOUNTER — Telehealth: Payer: Self-pay | Admitting: Family Medicine

## 2014-10-31 NOTE — Telephone Encounter (Signed)
Caller name: Carlyon Shadow  Relationship to patient: Physical Therapist  Can be reached: [phone] Pharmacy:  Reason for call: Therapist says that pt is independent and doesn't need a nurse. She was successful with doing things on her on .

## 2014-11-03 ENCOUNTER — Ambulatory Visit (INDEPENDENT_AMBULATORY_CARE_PROVIDER_SITE_OTHER): Payer: Medicare Other | Admitting: Family Medicine

## 2014-11-03 ENCOUNTER — Encounter: Payer: Self-pay | Admitting: Family Medicine

## 2014-11-03 VITALS — BP 137/78 | HR 88 | Temp 98.2°F | Ht 61.0 in | Wt 106.4 lb

## 2014-11-03 DIAGNOSIS — M810 Age-related osteoporosis without current pathological fracture: Secondary | ICD-10-CM | POA: Diagnosis not present

## 2014-11-03 DIAGNOSIS — R600 Localized edema: Secondary | ICD-10-CM

## 2014-11-03 DIAGNOSIS — Z48812 Encounter for surgical aftercare following surgery on the circulatory system: Secondary | ICD-10-CM | POA: Diagnosis not present

## 2014-11-03 DIAGNOSIS — I4891 Unspecified atrial fibrillation: Secondary | ICD-10-CM | POA: Diagnosis not present

## 2014-11-03 DIAGNOSIS — R32 Unspecified urinary incontinence: Secondary | ICD-10-CM

## 2014-11-03 DIAGNOSIS — E875 Hyperkalemia: Secondary | ICD-10-CM | POA: Diagnosis not present

## 2014-11-03 DIAGNOSIS — M545 Low back pain, unspecified: Secondary | ICD-10-CM

## 2014-11-03 DIAGNOSIS — I5032 Chronic diastolic (congestive) heart failure: Secondary | ICD-10-CM | POA: Diagnosis not present

## 2014-11-03 DIAGNOSIS — R634 Abnormal weight loss: Secondary | ICD-10-CM

## 2014-11-03 DIAGNOSIS — I10 Essential (primary) hypertension: Secondary | ICD-10-CM | POA: Diagnosis not present

## 2014-11-03 DIAGNOSIS — E559 Vitamin D deficiency, unspecified: Secondary | ICD-10-CM

## 2014-11-03 DIAGNOSIS — K5900 Constipation, unspecified: Secondary | ICD-10-CM

## 2014-11-03 DIAGNOSIS — R609 Edema, unspecified: Secondary | ICD-10-CM | POA: Diagnosis not present

## 2014-11-03 MED ORDER — LOSARTAN POTASSIUM 25 MG PO TABS: 25.0000 mg | ORAL_TABLET | Freq: Every day | ORAL | Status: DC

## 2014-11-03 MED ORDER — FUROSEMIDE 20 MG PO TABS: 20.0000 mg | ORAL_TABLET | Freq: Every day | ORAL | Status: DC | PRN

## 2014-11-03 NOTE — Progress Notes (Signed)
Pre visit review using our clinic review tool, if applicable. No additional management support is needed unless otherwise documented below in the visit note. 

## 2014-11-03 NOTE — Patient Instructions (Signed)
Salon pas patch or gel  Vertebral Fracture You have a fracture of one or more vertebra. These are the bony parts that form the spine. Minor vertebral fractures happen when people fall. Osteoporosis is associated with many of these fractures. Hospital care may not be necessary for minor compression fractures that are stable. However, multiple fractures of the spine or unstable injuries can cause severe pain and even damage the spinal cord. A spinal cord injury may cause paralysis, numbness, or loss of normal bowel and bladder control.  Normally there is pain and stiffness in the back for 3 to 6 weeks after a vertebral fracture. Bed rest for several days, pain medicine, and a slow return to activity is often the only treatment that is needed depending on the location of the fracture. Neck and back braces may be helpful in reducing pain and increasing mobility. When your pain allows, you should begin walking or swimming to help maintain your endurance. Exercises to improve motion and to strengthen the back may also be useful after the initial pain improves. Treatment for osteoporosis may be essential for full recovery. This will help reduce your risk of vertebral fractures with a future fall. During the first few days after a spine fracture you may feel nauseated or vomit. If this is severe, hospital care with IV fluids will be needed.  Arrange for follow-up care as recommended to assure proper long-term care and prevention of further spine injury.  SEEK IMMEDIATE MEDICAL CARE IF:  You have increasing pain, vomiting, or are unable to move around at all.  You develop numbness, tingling, weakness, or paralysis of any part of your body.  You develop a loss of normal bowel or bladder control.  You have difficulty breathing, cough, fever, chest or abdominal pain. MAKE SURE YOU:   Understand these instructions.  Will watch your condition.  Will get help right away if you are not doing well or get  worse. Document Released: 04/21/2004 Document Revised: 06/06/2011 Document Reviewed: 11/04/2008 [facility] Patient Information 2015 [place], [place]. This information is not intended to replace advice given to you by your health care provider. Make sure you discuss any questions you have with your health care provider.

## 2014-11-03 NOTE — Telephone Encounter (Signed)
Patient seen today

## 2014-11-04 LAB — COMPREHENSIVE METABOLIC PANEL
ALT: 13 U/L (ref 0–35)
AST: 22 U/L (ref 0–37)
Albumin: 4.2 g/dL (ref 3.5–5.2)
Alkaline Phosphatase: 85 U/L (ref 39–117)
BILIRUBIN TOTAL: 0.4 mg/dL (ref 0.2–1.2)
BUN: 15 mg/dL (ref 6–23)
CO2: 22 mEq/L (ref 19–32)
Calcium: 9.8 mg/dL (ref 8.4–10.5)
Chloride: 104 mEq/L (ref 96–112)
Creatinine, Ser: 0.71 mg/dL (ref 0.40–1.20)
GFR: 82.41 mL/min (ref >60.00)
GLUCOSE: 101 mg/dL — AB (ref 70–99)
Potassium: 4.2 mEq/L (ref 3.5–5.1)
Sodium: 138 mEq/L (ref 135–145)
TOTAL PROTEIN: 8 g/dL (ref 6.0–8.3)

## 2014-11-04 LAB — URINALYSIS, ROUTINE W REFLEX MICROSCOPIC
Bilirubin Urine: NEGATIVE
KETONES UR: NEGATIVE
Leukocytes, UA: NEGATIVE
NITRITE: NEGATIVE
Specific Gravity, Urine: <=1.005 — AB (ref 1.000–1.030)
Total Protein, Urine: NEGATIVE
URINE GLUCOSE: NEGATIVE
Urobilinogen, UA: 0.2 (ref 0.0–1.0)
WBC, UA: NONE SEEN (ref 0–?)
pH: 6.5 (ref 5.0–8.0)

## 2014-11-04 LAB — CBC
HCT: 39.2 % (ref 36.0–46.0)
Hemoglobin: 13.2 g/dL (ref 12.0–15.0)
MCHC: 33.8 g/dL (ref 30.0–36.0)
MCV: 91.1 fl (ref 78.0–100.0)
Platelets: 243 10*3/uL (ref 150.0–400.0)
RBC: 4.3 Mil/uL (ref 3.87–5.11)
RDW: 15 % (ref 11.5–15.5)
WBC: 7 10*3/uL (ref 4.0–10.5)

## 2014-11-04 LAB — URINE CULTURE

## 2014-11-05 DIAGNOSIS — I10 Essential (primary) hypertension: Secondary | ICD-10-CM | POA: Diagnosis not present

## 2014-11-05 DIAGNOSIS — M545 Low back pain: Secondary | ICD-10-CM | POA: Diagnosis not present

## 2014-11-05 DIAGNOSIS — I5032 Chronic diastolic (congestive) heart failure: Secondary | ICD-10-CM | POA: Diagnosis not present

## 2014-11-05 DIAGNOSIS — Z48812 Encounter for surgical aftercare following surgery on the circulatory system: Secondary | ICD-10-CM | POA: Diagnosis not present

## 2014-11-05 DIAGNOSIS — I4891 Unspecified atrial fibrillation: Secondary | ICD-10-CM | POA: Diagnosis not present

## 2014-11-05 DIAGNOSIS — M810 Age-related osteoporosis without current pathological fracture: Secondary | ICD-10-CM | POA: Diagnosis not present

## 2014-11-06 DIAGNOSIS — I10 Essential (primary) hypertension: Secondary | ICD-10-CM | POA: Diagnosis not present

## 2014-11-06 DIAGNOSIS — I4891 Unspecified atrial fibrillation: Secondary | ICD-10-CM | POA: Diagnosis not present

## 2014-11-06 DIAGNOSIS — I5032 Chronic diastolic (congestive) heart failure: Secondary | ICD-10-CM | POA: Diagnosis not present

## 2014-11-06 DIAGNOSIS — M545 Low back pain: Secondary | ICD-10-CM | POA: Diagnosis not present

## 2014-11-06 DIAGNOSIS — Z48812 Encounter for surgical aftercare following surgery on the circulatory system: Secondary | ICD-10-CM | POA: Diagnosis not present

## 2014-11-06 DIAGNOSIS — M810 Age-related osteoporosis without current pathological fracture: Secondary | ICD-10-CM | POA: Diagnosis not present

## 2014-11-10 DIAGNOSIS — I5032 Chronic diastolic (congestive) heart failure: Secondary | ICD-10-CM | POA: Diagnosis not present

## 2014-11-10 DIAGNOSIS — I4891 Unspecified atrial fibrillation: Secondary | ICD-10-CM | POA: Diagnosis not present

## 2014-11-10 DIAGNOSIS — Z48812 Encounter for surgical aftercare following surgery on the circulatory system: Secondary | ICD-10-CM | POA: Diagnosis not present

## 2014-11-10 DIAGNOSIS — I10 Essential (primary) hypertension: Secondary | ICD-10-CM | POA: Diagnosis not present

## 2014-11-10 DIAGNOSIS — M545 Low back pain: Secondary | ICD-10-CM | POA: Diagnosis not present

## 2014-11-10 DIAGNOSIS — M810 Age-related osteoporosis without current pathological fracture: Secondary | ICD-10-CM | POA: Diagnosis not present

## 2014-11-12 DIAGNOSIS — I4891 Unspecified atrial fibrillation: Secondary | ICD-10-CM | POA: Diagnosis not present

## 2014-11-12 DIAGNOSIS — Z48812 Encounter for surgical aftercare following surgery on the circulatory system: Secondary | ICD-10-CM | POA: Diagnosis not present

## 2014-11-12 DIAGNOSIS — I5032 Chronic diastolic (congestive) heart failure: Secondary | ICD-10-CM | POA: Diagnosis not present

## 2014-11-12 DIAGNOSIS — M810 Age-related osteoporosis without current pathological fracture: Secondary | ICD-10-CM | POA: Diagnosis not present

## 2014-11-12 DIAGNOSIS — I10 Essential (primary) hypertension: Secondary | ICD-10-CM | POA: Diagnosis not present

## 2014-11-12 DIAGNOSIS — M545 Low back pain: Secondary | ICD-10-CM | POA: Diagnosis not present

## 2014-11-13 ENCOUNTER — Encounter: Payer: Self-pay | Admitting: Internal Medicine

## 2014-11-13 DIAGNOSIS — I10 Essential (primary) hypertension: Secondary | ICD-10-CM | POA: Diagnosis not present

## 2014-11-13 DIAGNOSIS — M545 Low back pain: Secondary | ICD-10-CM | POA: Diagnosis not present

## 2014-11-13 DIAGNOSIS — M810 Age-related osteoporosis without current pathological fracture: Secondary | ICD-10-CM | POA: Diagnosis not present

## 2014-11-13 DIAGNOSIS — Z48812 Encounter for surgical aftercare following surgery on the circulatory system: Secondary | ICD-10-CM | POA: Diagnosis not present

## 2014-11-13 DIAGNOSIS — I4891 Unspecified atrial fibrillation: Secondary | ICD-10-CM | POA: Diagnosis not present

## 2014-11-13 DIAGNOSIS — I5032 Chronic diastolic (congestive) heart failure: Secondary | ICD-10-CM | POA: Diagnosis not present

## 2014-11-16 ENCOUNTER — Encounter: Payer: Self-pay | Admitting: Family Medicine

## 2014-11-16 NOTE — Assessment & Plan Note (Signed)
Need to continue supplementation

## 2014-11-16 NOTE — Assessment & Plan Note (Signed)
Encouraged to stay as active as tolerated and may try topical treatments

## 2014-11-16 NOTE — Assessment & Plan Note (Signed)
Improved on recheck they agree to monitor at home. Well controlled, no changes to meds. Encouraged heart healthy diet such as the DASH diet and exercise as tolerated.

## 2014-11-16 NOTE — Progress Notes (Signed)
Subjective:    Patient ID: Beth [name], female    DOB: May 09, 1923, 81 y.o.   MRN: [id number]  Chief Complaint  Patient presents with  . Follow-up    Skilled nursing home    HPI Patient is in today for follow up with complaints of fatigue and weakness. Has been struggling with low back pain and left hip pain. They are persistent but improved from last month. No recent falls. Has some ongoing trouble with urinary incontinence. No dysuria or hematuria. Notes cponstipation improved but still gassy. No fevers or other acute symptoms of illness. Denies CP/palp/SOB/HA/congestion/fevers/GI c/o. Taking meds as prescribed  Past Medical History  Diagnosis Date  . Hypertension   . Lung nodule     CT of chest 2007-left loewr lobar mass- evaluated by pulmonary- patient refused further work up  . Tobacco abuse   . Vertigo   . Abdominal pain, unspecified site 08/17/2012  . Other and unspecified hyperlipidemia 08/17/2012  . Loss of weight 01/19/2013  . Allergic state 06/22/2013  . Allergy   . Osteoporosis   . Syncope     fall  . Increased urinary frequency 01/02/2014  . Glaucoma 07/06/2014    Past Surgical History  Procedure Laterality Date  . Cholecystectomy    . Cesarean section    . Hernia repair    . Abdominal hysterectomy    . Ep implantable device N/A 09/26/2014    Procedure: Pacemaker Implant;  Surgeon: Evans Lance, MD;  Location: [facility];  Service: Cardiovascular;  Laterality: N/A;    Family History  Problem Relation Age of Onset  . Heart disease Mother   . Hypertension Mother   . Diabetes Mother   . Diabetes    . Cancer      lung- in distant releatives  . Cancer Brother     Social History   Social History  . Marital Status: Widowed    Spouse Name: N/A  . Number of Children: N/A  . Years of Education: N/A   Occupational History  . Not on file.   Social History Main Topics  . Smoking status: Current Every Day Smoker -- 0.10 packs/day for 60 years      Types: Cigarettes  . Smokeless tobacco: Never Used  . Alcohol Use: No  . Drug Use: No  . Sexual Activity: No   Other Topics Concern  . Not on file   Social History Narrative    Outpatient Prescriptions Prior to Visit  Medication Sig Dispense Refill  . aspirin 81 MG chewable tablet Chew 1 tablet (81 mg total) by mouth daily. 30 tablet 11  . cetirizine (ZYRTEC) 10 MG tablet Take 10 mg by mouth every morning.    . diltiazem (TIAZAC) 240 MG 24 hr capsule TAKE 1 CAPSULE (240 MG TOTAL) BY MOUTH DAILY. 30 capsule 6  . flecainide (TAMBOCOR) 50 MG tablet Take 1 tablet (50 mg total) by mouth every 12 (twelve) hours. 60 tablet 5  . metoprolol succinate (TOPROL-XL) 25 MG 24 hr tablet TAKE 1 TABLET (25 MG TOTAL) BY MOUTH DAILY. 90 tablet 1  . Multiple Vitamin (MULTIVITAMIN) tablet Take 1 tablet by mouth daily at 12 noon.     . Saline (SODIUM CHLORIDE) 0.65 % SOLN Place 1 spray into the nose as needed for congestion. 2 sprays each nostril once daily as needed    . losartan (COZAAR) 25 MG tablet Take 1 tablet (25 mg total) by mouth daily.    . fish  oil-omega-3 fatty acids 1000 MG capsule Take 2 g by mouth daily at 12 noon.     . polyethylene glycol (MIRALAX / GLYCOLAX) packet Take 17 g by mouth daily as needed. (Patient not taking: Reported on 11/03/2014) 14 each 0  . trimethoprim-polymyxin b (POLYTRIM) ophthalmic solution Place 2 drops into the left eye every 6 (six) hours. (Patient not taking: Reported on 11/03/2014) 10 mL 0   No facility-administered medications prior to visit.    Allergies  Allergen Reactions  . Ciprofloxacin Other (See Comments)    myalgias  . Fexofenadine Other (See Comments)    unknown  . Penicillins Other (See Comments)    Review of Systems  Constitutional: Positive for malaise/fatigue. Negative for fever and chills.  HENT: Negative for congestion and hearing loss.   Eyes: Negative for discharge.  Respiratory: Negative for cough, sputum production and shortness of  breath.   Cardiovascular: Negative for chest pain, palpitations and leg swelling.  Gastrointestinal: Negative for heartburn, nausea, vomiting, abdominal pain, diarrhea, constipation and blood in stool.  Genitourinary: Positive for urgency. Negative for dysuria, frequency and hematuria.  Musculoskeletal: Positive for joint pain. Negative for myalgias, back pain and falls.  Skin: Negative for rash.  Neurological: Positive for weakness. Negative for dizziness, sensory change, loss of consciousness and headaches.  Endo/Heme/Allergies: Negative for environmental allergies. Does not bruise/bleed easily.  Psychiatric/Behavioral: Negative for depression and suicidal ideas. The patient is not nervous/anxious and does not have insomnia.        Objective:    Physical Exam  Constitutional: She is oriented to person, place, and time. She appears well-developed and well-nourished. No distress.  HENT:  Head: Normocephalic and atraumatic.  Nose: Nose normal.  Eyes: Right eye exhibits no discharge. Left eye exhibits no discharge.  Neck: Normal range of motion. Neck supple.  Cardiovascular: Normal rate and regular rhythm.   No murmur heard. Pulmonary/Chest: Effort normal and breath sounds normal.  Abdominal: Soft. Bowel sounds are normal. There is no tenderness.  Musculoskeletal: She exhibits no edema.  Neurological: She is alert and oriented to person, place, and time.  Skin: Skin is warm and dry.  Psychiatric: She has a normal mood and affect.  Nursing note and vitals reviewed.   BP 137/78 mmHg  Pulse 88  Temp(Src) 98.2 F (36.8 C) (Oral)  Ht '5\' 1"'  (1.549 m)  Wt 106 lb 6 oz (48.251 kg)  BMI 20.11 kg/m2  SpO2 94% Wt Readings from Last 3 Encounters:  11/03/14 106 lb 6 oz (48.251 kg)  10/27/14 110 lb (49.896 kg)  10/21/14 108 lb (48.988 kg)     Lab Results  Component Value Date   WBC 7.0 11/03/2014   HGB 13.2 11/03/2014   HCT 39.2 11/03/2014   PLT 243.0 11/03/2014   GLUCOSE 101*  11/03/2014   CHOL 195 08/28/2014   TRIG 56.0 08/28/2014   HDL 66.90 08/28/2014   LDLDIRECT 127.9 10/05/2011   LDLCALC 117* 08/28/2014   ALT 13 11/03/2014   AST 22 11/03/2014   NA 138 11/03/2014   K 4.2 11/03/2014   CL 104 11/03/2014   CREATININE 0.71 11/03/2014   BUN 15 11/03/2014   CO2 22 11/03/2014   TSH 2.31 08/28/2014   INR 1.27 09/27/2014    Lab Results  Component Value Date   TSH 2.31 08/28/2014   Lab Results  Component Value Date   WBC 7.0 11/03/2014   HGB 13.2 11/03/2014   HCT 39.2 11/03/2014   MCV 91.1 11/03/2014   PLT  243.0 11/03/2014   Lab Results  Component Value Date   NA 138 11/03/2014   K 4.2 11/03/2014   CO2 22 11/03/2014   GLUCOSE 101* 11/03/2014   BUN 15 11/03/2014   CREATININE 0.71 11/03/2014   BILITOT 0.4 11/03/2014   ALKPHOS 85 11/03/2014   AST 22 11/03/2014   ALT 13 11/03/2014   PROT 8.0 11/03/2014   ALBUMIN 4.2 11/03/2014   CALCIUM 9.8 11/03/2014   ANIONGAP 4* 09/29/2014   GFR 82.41 11/03/2014   Lab Results  Component Value Date   CHOL 195 08/28/2014   Lab Results  Component Value Date   HDL 66.90 08/28/2014   Lab Results  Component Value Date   LDLCALC 117* 08/28/2014   Lab Results  Component Value Date   TRIG 56.0 08/28/2014   Lab Results  Component Value Date   CHOLHDL 3 08/28/2014   No results found for: HGBA1C     Assessment & Plan:   Problem List Items Addressed This Visit    Vitamin D deficiency    Need to continue supplementation      Midline low back pain without sciatica    Encouraged to stay as active as tolerated and may try topical treatments      Loss of weight    Is eating slightly more protein, encouraged to increase further with adequate hydration.      Essential hypertension    Improved on recheck they agree to monitor at home. Well controlled, no changes to meds. Encouraged heart healthy diet such as the DASH diet and exercise as tolerated.       Relevant Medications   losartan  (COZAAR) 25 MG tablet   furosemide (LASIX) 20 MG tablet   Constipation    Encouraged increased hydration and fiber in diet. Daily probiotics. If bowels not moving can use MOM 2 tbls po in 4 oz of warm prune juice by mouth every 2-3 days. If no results then repeat in 4 hours with  Dulcolax suppository pr, may repeat again in 4 more hours as needed. Seek care if symptoms worsen. Consider daily Miralax and/or Dulcolax if symptoms persist.       Atrial fibrillation with RVR    Good response to Flecainide      Relevant Medications   losartan (COZAAR) 25 MG tablet   furosemide (LASIX) 20 MG tablet    Other Visit Diagnoses    Urinary incontinence, unspecified incontinence type    -  Primary    Relevant Orders    CBC (Completed)    Comp Met (CMET) (Completed)    Urinalysis    Urine culture (Completed)    Hyperkalemia        Relevant Orders    CBC (Completed)    Comp Met (CMET) (Completed)    Urinalysis    Urine culture (Completed)    Pedal edema        Relevant Medications    furosemide (LASIX) 20 MG tablet    Other Relevant Orders    CBC (Completed)    Comp Met (CMET) (Completed)    Urinalysis    Urine culture (Completed)       I am having Ms. Guertin start on furosemide. I am also having her maintain her sodium chloride, multivitamin, fish oil-omega-3 fatty acids, cetirizine, aspirin, trimethoprim-polymyxin b, diltiazem, metoprolol succinate, flecainide, polyethylene glycol, and losartan.  Meds ordered this encounter  Medications  . losartan (COZAAR) 25 MG tablet    Sig: Take 1 tablet (25 mg  total) by mouth daily.    Dispense:  30 tablet    Refill:  6  . furosemide (LASIX) 20 MG tablet    Sig: Take 1 tablet (20 mg total) by mouth daily as needed for edema.    Dispense:  20 tablet    Refill:  1     Penni Homans, MD

## 2014-11-16 NOTE — Assessment & Plan Note (Signed)
Is eating slightly more protein, encouraged to increase further with adequate hydration.

## 2014-11-16 NOTE — Assessment & Plan Note (Signed)
Good response to Flecainide

## 2014-11-16 NOTE — Assessment & Plan Note (Signed)
Encouraged increased hydration and fiber in diet. Daily probiotics. If bowels not moving can use MOM 2 tbls po in 4 oz of warm prune juice by mouth every 2-3 days. If no results then repeat in 4 hours with  Dulcolax suppository pr, may repeat again in 4 more hours as needed. Seek care if symptoms worsen. Consider daily Miralax and/or Dulcolax if symptoms persist.  

## 2014-11-17 ENCOUNTER — Observation Stay (HOSPITAL_COMMUNITY)
Admission: EM | Admit: 2014-11-17 | Discharge: 2014-11-18 | Disposition: A | Discharge status: Home / Self Care | Payer: Medicare Other | Attending: Internal Medicine | Admitting: Internal Medicine

## 2014-11-17 ENCOUNTER — Emergency Department (HOSPITAL_COMMUNITY): Payer: Medicare Other

## 2014-11-17 ENCOUNTER — Encounter (HOSPITAL_COMMUNITY): Payer: Self-pay | Admitting: Emergency Medicine

## 2014-11-17 DIAGNOSIS — R0789 Other chest pain: Secondary | ICD-10-CM | POA: Diagnosis not present

## 2014-11-17 DIAGNOSIS — I10 Essential (primary) hypertension: Secondary | ICD-10-CM | POA: Insufficient documentation

## 2014-11-17 DIAGNOSIS — Z79899 Other long term (current) drug therapy: Secondary | ICD-10-CM | POA: Diagnosis not present

## 2014-11-17 DIAGNOSIS — I495 Sick sinus syndrome: Secondary | ICD-10-CM | POA: Diagnosis present

## 2014-11-17 DIAGNOSIS — M25512 Pain in left shoulder: Secondary | ICD-10-CM | POA: Diagnosis not present

## 2014-11-17 DIAGNOSIS — I5022 Chronic systolic (congestive) heart failure: Secondary | ICD-10-CM | POA: Diagnosis not present

## 2014-11-17 DIAGNOSIS — Z720 Tobacco use: Secondary | ICD-10-CM | POA: Diagnosis not present

## 2014-11-17 DIAGNOSIS — R079 Chest pain, unspecified: Secondary | ICD-10-CM | POA: Diagnosis not present

## 2014-11-17 DIAGNOSIS — M810 Age-related osteoporosis without current pathological fracture: Secondary | ICD-10-CM | POA: Diagnosis not present

## 2014-11-17 DIAGNOSIS — Z7982 Long term (current) use of aspirin: Secondary | ICD-10-CM | POA: Diagnosis not present

## 2014-11-17 DIAGNOSIS — H409 Unspecified glaucoma: Secondary | ICD-10-CM | POA: Diagnosis not present

## 2014-11-17 DIAGNOSIS — R072 Precordial pain: Secondary | ICD-10-CM | POA: Diagnosis not present

## 2014-11-17 DIAGNOSIS — M542 Cervicalgia: Secondary | ICD-10-CM | POA: Diagnosis not present

## 2014-11-17 DIAGNOSIS — E785 Hyperlipidemia, unspecified: Secondary | ICD-10-CM | POA: Insufficient documentation

## 2014-11-17 LAB — I-STAT CHEM 8, ED
BUN: 19 mg/dL (ref 6–20)
CREATININE: 0.6 mg/dL (ref 0.44–1.00)
Calcium, Ion: 1.13 mmol/L (ref 1.13–1.30)
Chloride: 105 mmol/L (ref 101–111)
Glucose, Bld: 112 mg/dL — ABNORMAL HIGH (ref 65–99)
HEMATOCRIT: 38 % (ref 36.0–46.0)
HEMOGLOBIN: 12.9 g/dL (ref 12.0–15.0)
Potassium: 4.1 mmol/L (ref 3.5–5.1)
SODIUM: 138 mmol/L (ref 135–145)
TCO2: 24 mmol/L (ref 0–100)

## 2014-11-17 LAB — BASIC METABOLIC PANEL
Anion gap: 11 (ref 5–15)
BUN: 15 mg/dL (ref 6–20)
CALCIUM: 9.4 mg/dL (ref 8.9–10.3)
CO2: 24 mmol/L (ref 22–32)
CREATININE: 0.65 mg/dL (ref 0.44–1.00)
Chloride: 104 mmol/L (ref 101–111)
GFR calc Af Amer: >60 mL/min (ref >60)
GLUCOSE: 117 mg/dL — AB (ref 65–99)
Potassium: 4.1 mmol/L (ref 3.5–5.1)
SODIUM: 139 mmol/L (ref 135–145)

## 2014-11-17 LAB — CBC
HCT: 37.5 % (ref 36.0–46.0)
Hemoglobin: 12.7 g/dL (ref 12.0–15.0)
MCH: 30.4 pg (ref 26.0–34.0)
MCHC: 33.9 g/dL (ref 30.0–36.0)
MCV: 89.7 fL (ref 78.0–100.0)
PLATELETS: 274 10*3/uL (ref 150–400)
RBC: 4.18 MIL/uL (ref 3.87–5.11)
RDW: 13.7 % (ref 11.5–15.5)
WBC: 7.7 10*3/uL (ref 4.0–10.5)

## 2014-11-17 LAB — TROPONIN I: Troponin I: <0.03 ng/mL (ref <0.031)

## 2014-11-17 LAB — BRAIN NATRIURETIC PEPTIDE: B NATRIURETIC PEPTIDE 5: 440.7 pg/mL — AB (ref 0.0–100.0)

## 2014-11-17 LAB — I-STAT TROPONIN, ED: TROPONIN I, POC: 0 ng/mL (ref 0.00–0.08)

## 2014-11-17 MED ORDER — ONDANSETRON HCL 4 MG PO TABS: 4.0000 mg | ORAL_TABLET | Freq: Four times a day (QID) | ORAL | Status: DC | PRN

## 2014-11-17 MED ORDER — OXYCODONE HCL 5 MG PO TABS
5.0000 mg | ORAL_TABLET | ORAL | Status: DC | PRN
Start: 2014-11-17 — End: 2014-11-18

## 2014-11-17 MED ORDER — SODIUM CHLORIDE 0.9 % IJ SOLN
3.0000 mL | Freq: Two times a day (BID) | INTRAMUSCULAR | Status: DC
  Administered 2014-11-17 (×2): 3 mL via INTRAVENOUS
  Filled 2014-11-17: qty 3

## 2014-11-17 MED ORDER — ADULT MULTIVITAMIN W/MINERALS CH
1.0000 | ORAL_TABLET | Freq: Every day | ORAL | Status: DC
  Administered 2014-11-17: 1 via ORAL
  Filled 2014-11-17 (×2): qty 1

## 2014-11-17 MED ORDER — KETOROLAC TROMETHAMINE 30 MG/ML IJ SOLN
30.0000 mg | Freq: Once | INTRAMUSCULAR | Status: AC
  Administered 2014-11-17: 30 mg via INTRAVENOUS
  Filled 2014-11-17: qty 1

## 2014-11-17 MED ORDER — LORATADINE 10 MG PO TABS
10.0000 mg | ORAL_TABLET | Freq: Every day | ORAL | Status: DC
  Administered 2014-11-17: 10 mg via ORAL
  Filled 2014-11-17 (×2): qty 1

## 2014-11-17 MED ORDER — SODIUM CHLORIDE 0.9 % IV SOLN: 250.0000 mL | INTRAVENOUS | Status: DC | PRN

## 2014-11-17 MED ORDER — SODIUM CHLORIDE 0.9 % IJ SOLN
3.0000 mL | Freq: Two times a day (BID) | INTRAMUSCULAR | Status: DC
  Administered 2014-11-17: 3 mL via INTRAVENOUS
  Filled 2014-11-17: qty 3

## 2014-11-17 MED ORDER — HYDROMORPHONE HCL 1 MG/ML IJ SOLN: 0.5000 mg | INTRAMUSCULAR | Status: DC | PRN

## 2014-11-17 MED ORDER — SODIUM CHLORIDE 0.9 % IJ SOLN: 3.0000 mL | INTRAMUSCULAR | Status: DC | PRN

## 2014-11-17 MED ORDER — ONDANSETRON HCL 4 MG/2ML IJ SOLN: 4.0000 mg | Freq: Four times a day (QID) | INTRAMUSCULAR | Status: DC | PRN

## 2014-11-17 MED ORDER — CALCIUM CARBONATE-VITAMIN D 500-200 MG-UNIT PO TABS
1.0000 | ORAL_TABLET | Freq: Every day | ORAL | Status: DC
  Administered 2014-11-17: 1 via ORAL
  Filled 2014-11-17 (×3): qty 1

## 2014-11-17 MED ORDER — ASPIRIN EC 325 MG PO TBEC
325.0000 mg | DELAYED_RELEASE_TABLET | Freq: Every day | ORAL | Status: DC
  Administered 2014-11-17: 325 mg via ORAL
  Filled 2014-11-17 (×2): qty 1

## 2014-11-17 MED ORDER — ACETAMINOPHEN 650 MG RE SUPP: 650.0000 mg | Freq: Four times a day (QID) | RECTAL | Status: DC | PRN

## 2014-11-17 MED ORDER — OMEGA-3-ACID ETHYL ESTERS 1 G PO CAPS
2.0000 g | ORAL_CAPSULE | Freq: Every day | ORAL | Status: DC
  Administered 2014-11-17: 2 g via ORAL
  Filled 2014-11-17 (×2): qty 2

## 2014-11-17 MED ORDER — DILTIAZEM HCL ER BEADS 240 MG PO CP24
240.0000 mg | ORAL_CAPSULE | Freq: Every day | ORAL | Status: DC
  Administered 2014-11-17: 240 mg via ORAL
  Filled 2014-11-17 (×3): qty 1

## 2014-11-17 MED ORDER — ENOXAPARIN SODIUM 40 MG/0.4ML ~~LOC~~ SOLN
40.0000 mg | Freq: Every day | SUBCUTANEOUS | Status: DC
  Administered 2014-11-17: 40 mg via SUBCUTANEOUS
  Filled 2014-11-17 (×3): qty 0.4

## 2014-11-17 MED ORDER — FLECAINIDE ACETATE 50 MG PO TABS
50.0000 mg | ORAL_TABLET | Freq: Two times a day (BID) | ORAL | Status: DC
  Administered 2014-11-17 (×2): 50 mg via ORAL
  Filled 2014-11-17 (×4): qty 1

## 2014-11-17 MED ORDER — ACETAMINOPHEN 325 MG PO TABS: 650.0000 mg | ORAL_TABLET | Freq: Four times a day (QID) | ORAL | Status: DC | PRN

## 2014-11-17 MED ORDER — ALUM & MAG HYDROXIDE-SIMETH 200-200-20 MG/5ML PO SUSP: 30.0000 mL | Freq: Four times a day (QID) | ORAL | Status: DC | PRN

## 2014-11-17 MED ORDER — REGADENOSON 0.4 MG/5ML IV SOLN
0.4000 mg | Freq: Once | INTRAVENOUS | Status: AC
Start: 2014-11-17 — End: 2014-11-18
  Administered 2014-11-18: 0.4 mg via INTRAVENOUS
  Filled 2014-11-17: qty 5

## 2014-11-17 MED ORDER — SIMVASTATIN 20 MG PO TABS
20.0000 mg | ORAL_TABLET | Freq: Every day | ORAL | Status: DC
  Administered 2014-11-17: 20 mg via ORAL
  Filled 2014-11-17 (×2): qty 1

## 2014-11-17 MED ORDER — METOPROLOL SUCCINATE ER 25 MG PO TB24
25.0000 mg | ORAL_TABLET | Freq: Every day | ORAL | Status: DC
  Administered 2014-11-17: 25 mg via ORAL
  Filled 2014-11-17 (×2): qty 1

## 2014-11-17 NOTE — ED Notes (Signed)
Per EMS - pt comes from home c/o new burning sensation since earlier this evening.  Pt reports burning pain goes from L bicep up to L side of neck.  Pt states pain is 1/10 when sitting or standing, but goes up to 8 or 9/10 when lying down.  Pt had atrial demand pacemaker placed in July 2016 and has experienced no complications since.  Denies SOB, dizziness, N/V.  EMS VS: 158/70, HR in the 80s, 98% RA.  A&O x 4.

## 2014-11-17 NOTE — ED Notes (Signed)
MD at bedside. 

## 2014-11-17 NOTE — ED Notes (Signed)
Hospitalist at bedside 

## 2014-11-17 NOTE — Progress Notes (Signed)
Patient is seen &examined. Please see today's H&P for the details. 81 y/o female with PMH of HTN, Probable COPD, Tobacco use, Recent PPM placement (09/2014) is presented with L sided chest pans.  -admitted for evaluation of chest pains. Initial trop neg, LBBB. Patient is chest pain free at the time of evaluation. We will cont BB, ASA, add statins, monitor on tele, serial trop/ECG. Recent LVEF 45-50%. consulted cardiology eval for nuclear stress test   Kinnie Feil  [facility] Pager [phone]. If 7PM-7AM, please contact night-coverage at [email or web address], password [facility] 11/17/2014, 9:05 AM

## 2014-11-17 NOTE — ED Notes (Signed)
Pt ambulatory with minimal assistance to restroom. Denies shortness of breath or chest pain. Does not appear to be in any distress with exertion.

## 2014-11-17 NOTE — Consult Note (Signed)
CARDIOLOGY CONSULT NOTE   Patient ID: Beth [name] MRN: [id number] DOB/AGE: 06-08-1923 81 y.o.  Admit date: 11/17/2014  Primary Physician   Penni Homans, MD Primary Cardiologist   Dr. Acie Fredrickson Reason for Consultation  Chest pain  HPI: Beth [name] is a 81 y.o. female with a history of HTN, aortic insufficiency, PAF on Flecainide and ASA, tachybrady syndrome s/p recent PPM, tobacco abuse and non obst CAD who presented to [facility] last night for left sided burning sensation.   She has a history of CAD with remote LHC with luminal irregularities ( over 20 years ago). Negative Myoview in June of 2013. Noted to have had past abnormal CT chest and abnormal CXR from several years ago - suspicious for lung cancer but patient refused further work up. She has known paroxysmal atrial fibrillation and has not been felt to be a candidate for anticoagulation due to falls.She was admitted 09/2014 for syncope and was found to have AF with RVR as well as up to 5 second post termination pauses with intermittent junctional rhythm.She was diagnosed with symptomatic tachy-brady syndrome and PPM was recommended. She underwent pacemaker placement on 09/26/14 and was discharged on flecainide, diltizem and metoprolol. Echocardiogram 09/26/14 w/ EF Normal LV size with mild LV hypertrophy, EF 45-50%. Diffusehypokinesis with septal bounce. Normal RV size and systolicfunction. Moderate tricuspid regurgitation with moderatepulmonary hypertension.   She presented to the ED with complaints of burning chest pain around her pacemaker that radiates into her arm, neck, face and head associated with SOB and diaphoresis intermittently x 2-3 days and present only when lying down. It has now completely resolved but she does admit to continued shortness of breath when supine. No LE edema or PND.    Past Medical History  Diagnosis Date  . Hypertension   . Lung nodule     CT of chest 2007-left loewr lobar mass- evaluated by  pulmonary- patient refused further work up  . Tobacco abuse   . Vertigo   . Abdominal pain, unspecified site 08/17/2012  . Other and unspecified hyperlipidemia 08/17/2012  . Loss of weight 01/19/2013  . Allergic state 06/22/2013  . Allergy   . Osteoporosis   . Syncope     fall  . Increased urinary frequency 01/02/2014  . Glaucoma 07/06/2014     Past Surgical History  Procedure Laterality Date  . Cholecystectomy    . Cesarean section    . Hernia repair    . Abdominal hysterectomy    . Ep implantable device N/A 09/26/2014    Procedure: Pacemaker Implant;  Surgeon: Evans Lance, MD;  Location: [facility];  Service: Cardiovascular;  Laterality: N/A;    Allergies  Allergen Reactions  . Penicillins Shortness Of Breath, Diarrhea and Other (See Comments)    Stomach cramping  . Ciprofloxacin Other (See Comments)    myalgias  . Fexofenadine Other (See Comments)    unknown    I have reviewed the patient's current medications . aspirin EC  325 mg Oral Daily  . calcium-vitamin D  1 tablet Oral Q breakfast  . diltiazem  240 mg Oral Daily  . enoxaparin (LOVENOX) injection  40 mg Subcutaneous Daily  . flecainide  50 mg Oral Q12H  . loratadine  10 mg Oral Daily  . metoprolol succinate  25 mg Oral Daily  . multivitamin with minerals  1 tablet Oral Q1200  . omega-3 acid ethyl esters  2 g Oral Q1200  . simvastatin  20 mg Oral q1800  . sodium chloride  3 mL Intravenous Q12H  . sodium chloride  3 mL Intravenous Q12H   . sodium chloride     sodium chloride, acetaminophen **OR** acetaminophen, alum & mag hydroxide-simeth, HYDROmorphone (DILAUDID) injection, ondansetron **OR** ondansetron (ZOFRAN) IV, oxyCODONE, sodium chloride  Prior to Admission medications   Medication Sig Start Date End Date Taking? Authorizing Provider  aspirin 81 MG chewable tablet Chew 1 tablet (81 mg total) by mouth daily. 07/02/13  Yes Orson Eva, MD  cetirizine (ZYRTEC) 10 MG tablet Take 10 mg by mouth every  morning.   Yes Historical Provider, MD  fish oil-omega-3 fatty acids 1000 MG capsule Take 2 g by mouth daily at 12 noon.    Yes Historical Provider, MD  Multiple Vitamin (MULTIVITAMIN) tablet Take 1 tablet by mouth daily at 12 noon.    Yes Historical Provider, MD  calcium-vitamin D (OSCAL WITH D) 500-200 MG-UNIT per tablet Take 1 tablet by mouth daily with breakfast.   Yes Historical Provider, MD  diclofenac (FLECTOR) 1.3 % PTCH Place 1 patch onto the skin daily as needed (pain).   Yes Historical Provider, MD  diltiazem (TIAZAC) 240 MG 24 hr capsule TAKE 1 CAPSULE (240 MG TOTAL) BY MOUTH DAILY. 08/28/14  Yes Mosie Lukes, MD  flecainide (TAMBOCOR) 50 MG tablet Take 1 tablet (50 mg total) by mouth every 12 (twelve) hours. 09/27/14  Yes Almyra Deforest, PA  furosemide (LASIX) 20 MG tablet Take 1 tablet (20 mg total) by mouth daily as needed for edema. Patient taking differently: Take 20 mg by mouth daily as needed for edema.  11/03/14  Yes Mosie Lukes, MD  losartan (COZAAR) 25 MG tablet Take 1 tablet (25 mg total) by mouth daily. 11/03/14   Mosie Lukes, MD  metoprolol succinate (TOPROL-XL) 25 MG 24 hr tablet TAKE 1 TABLET (25 MG TOTAL) BY MOUTH DAILY. 08/28/14  Yes Mosie Lukes, MD  Saline (SODIUM CHLORIDE) 0.65 % SOLN Place 1 spray into the nose as needed for congestion. 2 sprays each nostril once daily as needed   Yes Historical Provider, MD  Wheat Dextrin (BENEFIBER PO) Take 1 Applicatorful by mouth daily.   Yes Historical Provider, MD     Social History   Social History  . Marital Status: Widowed    Spouse Name: N/A  . Number of Children: N/A  . Years of Education: N/A   Occupational History  . Not on file.   Social History Main Topics  . Smoking status: Current Every Day Smoker -- 0.10 packs/day for 60 years    Types: Cigarettes  . Smokeless tobacco: Never Used  . Alcohol Use: No  . Drug Use: No  . Sexual Activity: No   Other Topics Concern  . Not on file   Social History Narrative     Family Status  Relation Status Death Age  . Mother Alive   . Daughter Alive   . Brother Alive    Family History  Problem Relation Age of Onset  . Heart disease Mother   . Hypertension Mother   . Diabetes Mother   . Diabetes    . Cancer      lung- in distant releatives  . Cancer Brother      ROS:  Full 14 point review of systems complete and found to be negative unless listed above.  Physical Exam: Blood pressure 147/50, pulse 60, resp. rate 19, height 5\' 1"  (1.549 m), weight 48.081 kg (106  lb), SpO2 94 %.  General: Well developed, well nourished, female in no acute distress. Elderly, frail Head: Eyes PERRLA, No xanthomas.   Normocephalic and atraumatic, oropharynx without edema or exudate.  Lungs: Crackles at bases  Heart: HRRR S1 S2, no rub/gallop, Heart irregular rate and rhythm with S1, S2  murmur. pulses are 2+ extrem.   Neck: No carotid bruits. No lymphadenopathy. No JVD. Abdomen: Bowel sounds present, abdomen soft and non-tender without masses or hernias noted. Msk:  No spine or cva tenderness. No weakness, no joint deformities or effusions. Extremities: No clubbing or cyanosis. No edema.  Neuro: Alert and oriented X 3. No focal deficits noted. Psych:  Good affect, responds appropriately Skin: No rashes or lesions noted.  Labs:   Lab Results  Component Value Date   WBC 7.7 11/17/2014   HGB 12.9 11/17/2014   HCT 38.0 11/17/2014   MCV 89.7 11/17/2014   PLT 274 11/17/2014   No results for input(s): INR in the last 72 hours.  Recent Labs Lab 11/17/14 0118 11/17/14 0142  NA 139 138  K 4.1 4.1  CL 104 105  CO2 24  --   BUN 15 19  CREATININE 0.65 0.60  CALCIUM 9.4  --   GLUCOSE 117* 112*   MAGNESIUM  Date Value Ref Range Status  09/27/2014 1.8 1.7 - 2.4 mg/dL Final    Recent Labs  11/17/14 0927  TROPONINI <0.03    Recent Labs  11/17/14 0138  TROPIPOC 0.00   No results found for: PROBNP Lab Results  Component Value Date   CHOL 195  08/28/2014   HDL 66.90 08/28/2014   LDLCALC 117* 08/28/2014   TRIG 56.0 08/28/2014    No results found for: VITAMINB12, FOLATE, FERRITIN, TIBC, IRON, RETICCTPCT  Echo: Study Date: 09/26/2014 Study Conclusions - Left ventricle: The cavity size was normal. Wall thickness was   increased in a pattern of mild LVH. Systolic function was mildly   reduced. The estimated ejection fraction was in the range of 45%   to 50%. Diffuse hypokinesis with septal bounce. Doppler   parameters are consistent with abnormal left ventricular   relaxation (grade 1 diastolic dysfunction). - Aortic valve: There was no stenosis. There was mild   regurgitation. - Aorta: Diffuse plaque noted in descending thoracic aorta. - Mitral v** alve: Mildly calcified annulus. Mildly calcified leaflets   . There was trivial regurgitation. - Right ventricle: The cavity size was normal. Systolic function   was normal. - Tricuspid valve: There was moderate regurgitation. Peak RV-RA   gradient (S): 51 mm Hg. - Pulmonary arteries: PA peak pressure: 66 mm Hg (S). - Systemic veins: IVC measured 2.2 cm with < 50% respirophasic   variation, suggesting RA pressure 15 mmHg. Impressions: - Normal LV size with mild LV hypertrophy, EF 45-50%. Diffuse   hypokinesis with septal bounce. Normal RV size and systolic   function. Moderate tricuspid regurgitation with moderate   pulmonary hypertension.   ECG:  HR 69. NSR w/ LBBB  Radiology:  Dg Chest 2 View  11/17/2014   CLINICAL DATA:  Chest pain radiating to the left arm and neck for couple of hr tonight.  EXAM: CHEST  2 VIEW  COMPARISON:  09/27/2014  FINDINGS: Cardiac pacemaker. Normal heart size and pulmonary vascularity. Tortuous and calcified aorta. Mediastinal contours appear intact. Hyperinflation of the lungs consistent with emphysema. Scarring in the apices. Multiple pulmonary nodules on the left. Query nodules have been present on prior studies, some dating back to 05/19/2008.  Correlate with clinical history for possible etiology. Blunting of the right costophrenic angle developing since prior study, suggesting a small pleural effusion. Old right rib fractures.  IMPRESSION: Emphysematous changes and fibrosis in the lungs with multiple pulmonary nodules on the left. Nodules appear unchanged since previous study. New blunting of right costophrenic angle suggesting small developing pleural effusion.   Electronically Signed   By: Lucienne Capers M.D.   On: 11/17/2014 02:08    ASSESSMENT AND PLAN:    Principal Problem:   Chest pain Active Problems:   Essential hypertension   Sinus brady-tachy syndrome   Chronic systolic heart failure   Beth [name] is a 81 y.o. female with a history of HTN, aortic insufficiency, PAF on Flecainide and ASA, tachybrady syndrome s/p recent PPM, tobacco abuse and non obst CAD who presented to [facility] last night for left sided burning sensation.   Chest pain- atypical. Burning sensation on her left side worse when supine. Pain radiates into her neck and head.  -- POC troponin neg. Troponin I neg x1. ECG with no acute ST or TW changes. LBBB that is old.  -- Continue to cycle enzymes and serial ECG -- Likely proceed with lexiscan myoview tomorrow AM. NPO after midinight.   PAF- currently maintaining NSR -- CHADSVASC score of at least 4. Not a long term AC candidate due to falls. Continue ASA -- Continue flecainide 50 mg BID, dilt 240mg  qd, metoprolol XL 25mg  qd  Tachybrady s/p PPM (09/26/2014) - pacemaker site a little tender but does not appear infected. No white count or fever.   HTN- continue dilt 240mg  qd and Toprol XL 25mg  qd  Chronic systolic CHF- EF 03-54%. She admits to orthopnea. CXR with possible developing R pleural effusion. Will add BNP.  Pulmonary nodules- this has been documented before and she has refused further work up. Stable by CXR this admission.   SignedCrista Luria 11/17/2014 12:11 PM  Pager  [phone]  Co-Sign MD  History and all data above reviewed.  Patient examined.  I agree with the findings per Reino Bellis, PA-C, as above.  The patient exam reveals COR: RRR.  No m/r/g  ,  Lungs: ctab  ,  Abd: soft, nt, nd, Ext wam.  No edema.  2+ PT/TP  .  All available labs, radiology testing, previous records reviewed. Agree with documented assessment and plan. Ms. Bultman is an 89F s/p recent PPM here with atypical, burning, L sided chest pain.  Given her age and hypertension, will continue to cycle cardiac enzymes and obtain [facility] tomorrow.     Oval Linsey, Karita Dralle P  2:15 PM  11/17/2014

## 2014-11-17 NOTE — H&P (Addendum)
[facility] Admission History and Physical       KEMI GELL TIW:[id number] DOB: 1924-03-03 DOA: 11/17/2014  Referring physician: EDP PCP: Penni Homans, MD  Specialists:   Chief Complaint: Chest Pain  HPI: NAAMAH [name] is a 81 y.o. female with history of HTN and recent pacemaker placement on 09/26/2014 who presents to the ED with complaints of burning chest pain around her pacemaker that radiates into her neck and face associated with SOB and diaphoresis intermittently x 2-3 days.   She was seen in the ED and was referred for a chest pain rule out.  The initial troponin was negative.      Review of Systems:    Constitutional: No Weight Loss, No Weight Gain, Night Sweats, Fevers, Chills, Dizziness, Light Headedness, Fatigue, or Generalized Weakness HEENT: No Headaches, Difficulty Swallowing,Tooth/Dental Problems,Sore Throat,  No Sneezing, Rhinitis, Ear Ache, Nasal Congestion, or Post Nasal Drip,  Cardio-vascular:   +Chest pain, Orthopnea, PND, Edema in Lower Extremities, Anasarca, Dizziness, Palpitations  Resp:  +Dyspnea, No DOE, No Productive Cough, No Non-Productive Cough, No Hemoptysis, No Wheezing.    GI: No Heartburn, Indigestion, Abdominal Pain, Nausea, Vomiting, Diarrhea, Constipation, Hematemesis, Hematochezia, Melena, Change in Bowel Habits,  Loss of Appetite  GU: No Dysuria, No Change in Color of Urine, No Urgency or Urinary Frequency, No Flank pain.  Musculoskeletal: No Joint Pain or Swelling, No Decreased Range of Motion, No Back Pain.  Neurologic: No Syncope, No Seizures, Muscle Weakness, Paresthesia, Vision Disturbance or Loss, No Diplopia, No Vertigo, No Difficulty Walking,  Skin: No Rash or Lesions. Psych: No Change in Mood or Affect, No Depression or Anxiety, No Memory loss, No Confusion, or Hallucinations   Past Medical History  Diagnosis Date  . Hypertension   . Lung nodule     CT of chest 2007-left loewr lobar mass- evaluated by pulmonary-  patient refused further work up  . Tobacco abuse   . Vertigo   . Abdominal pain, unspecified site 08/17/2012  . Other and unspecified hyperlipidemia 08/17/2012  . Loss of weight 01/19/2013  . Allergic state 06/22/2013  . Allergy   . Osteoporosis   . Syncope     fall  . Increased urinary frequency 01/02/2014  . Glaucoma 07/06/2014     Past Surgical History  Procedure Laterality Date  . Cholecystectomy    . Cesarean section    . Hernia repair    . Abdominal hysterectomy    . Ep implantable device N/A 09/26/2014    Procedure: Pacemaker Implant;  Surgeon: Evans Lance, MD;  Location: [facility];  Service: Cardiovascular;  Laterality: N/A;      Prior to Admission medications   Medication Sig Start Date End Date Taking? Authorizing Provider  aspirin 81 MG chewable tablet Chew 1 tablet (81 mg total) by mouth daily. 07/02/13  Yes Orson Eva, MD  calcium-vitamin D (OSCAL WITH D) 500-200 MG-UNIT per tablet Take 1 tablet by mouth daily with breakfast.   Yes Historical Provider, MD  cetirizine (ZYRTEC) 10 MG tablet Take 10 mg by mouth every morning.   Yes Historical Provider, MD  diclofenac (FLECTOR) 1.3 % PTCH Place 1 patch onto the skin daily as needed (pain).   Yes Historical Provider, MD  diltiazem (TIAZAC) 240 MG 24 hr capsule TAKE 1 CAPSULE (240 MG TOTAL) BY MOUTH DAILY. 08/28/14  Yes Mosie Lukes, MD  fish oil-omega-3 fatty acids 1000 MG capsule Take 2 g by mouth daily at 12 noon.  Yes Historical Provider, MD  flecainide (TAMBOCOR) 50 MG tablet Take 1 tablet (50 mg total) by mouth every 12 (twelve) hours. 09/27/14  Yes Almyra Deforest, PA  furosemide (LASIX) 20 MG tablet Take 1 tablet (20 mg total) by mouth daily as needed for edema. Patient taking differently: Take 20 mg by mouth daily as needed for edema.  11/03/14  Yes Mosie Lukes, MD  metoprolol succinate (TOPROL-XL) 25 MG 24 hr tablet TAKE 1 TABLET (25 MG TOTAL) BY MOUTH DAILY. 08/28/14  Yes Mosie Lukes, MD  Multiple Vitamin  (MULTIVITAMIN) tablet Take 1 tablet by mouth daily at 12 noon.    Yes Historical Provider, MD  Saline (SODIUM CHLORIDE) 0.65 % SOLN Place 1 spray into the nose as needed for congestion. 2 sprays each nostril once daily as needed   Yes Historical Provider, MD  Wheat Dextrin (BENEFIBER PO) Take 1 Applicatorful by mouth daily.   Yes Historical Provider, MD  losartan (COZAAR) 25 MG tablet Take 1 tablet (25 mg total) by mouth daily. 11/03/14   Mosie Lukes, MD     Allergies  Allergen Reactions  . Penicillins Shortness Of Breath, Diarrhea and Other (See Comments)    Stomach cramping  . Ciprofloxacin Other (See Comments)    myalgias  . Fexofenadine Other (See Comments)    unknown    Social History:  reports that she has been smoking Cigarettes.  She has a 6 pack-year smoking history. She has never used smokeless tobacco. She reports that she does not drink alcohol or use illicit drugs.    Family History  Problem Relation Age of Onset  . Heart disease Mother   . Hypertension Mother   . Diabetes Mother   . Diabetes    . Cancer      lung- in distant releatives  . Cancer Brother        Physical Exam:  GEN:  Pleasant Elderly Thin  81 y.o. Caucasian female examined and in no acute distress; cooperative with exam Filed Vitals:   11/17/14 0207 11/17/14 0208 11/17/14 0230 11/17/14 0300  BP:   163/43 156/47  Pulse: 64 64 60 72  Resp: 17 19 13 14   Height:      Weight:      SpO2: 96% 95% 97% 97%   Blood pressure 156/47, pulse 72, resp. rate 14, height 5\' 1"  (1.549 m), weight 48.081 kg (106 lb), SpO2 97 %. PSYCH: SHe is alert and oriented x4; does not appear anxious does not appear depressed; affect is normal HEENT: Normocephalic and Atraumatic, Mucous membranes pink; PERRLA; EOM intact; Fundi:  Benign;  No scleral icterus, Nares: Patent, Oropharynx: Clear, Edentulous,    Neck:  FROM, No Cervical Lymphadenopathy nor Thyromegaly or Carotid Bruit; No JVD; Breasts:: Not examined CHEST WALL:  No tenderness CHEST: Normal respiration, clear to auscultation bilaterally HEART: Regular rate and rhythm; no murmurs rubs or gallops BACK: No kyphosis or scoliosis; No CVA tenderness ABDOMEN: Positive Bowel Sounds, Soft Non-Tender, No Rebound or Guarding; No Masses, No Organomegaly. Rectal Exam: Not done EXTREMITIES: No Cyanosis, Clubbing, or Edema; No Ulcerations. Genitalia: not examined PULSES: 2+ and symmetric SKIN: Normal hydration no rash or ulceration CNS:  Alert and Oriented x 4, No Focal Deficits Vascular: pulses palpable throughout    Labs on Admission:  Basic Metabolic Panel:  Recent Labs Lab 11/17/14 0118 11/17/14 0142  NA 139 138  K 4.1 4.1  CL 104 105  CO2 24  --   GLUCOSE 117* 112*  BUN 15 19  CREATININE 0.65 0.60  CALCIUM 9.4  --    Liver Function Tests: No results for input(s): AST, ALT, ALKPHOS, BILITOT, PROT, ALBUMIN in the last 168 hours. No results for input(s): LIPASE, AMYLASE in the last 168 hours. No results for input(s): AMMONIA in the last 168 hours. CBC:  Recent Labs Lab 11/17/14 0118 11/17/14 0142  WBC 7.7  --   HGB 12.7 12.9  HCT 37.5 38.0  MCV 89.7  --   PLT 274  --    Cardiac Enzymes: No results for input(s): CKTOTAL, CKMB, CKMBINDEX, TROPONINI in the last 168 hours.  BNP (last 3 results) No results for input(s): BNP in the last 8760 hours.  ProBNP (last 3 results) No results for input(s): PROBNP in the last 8760 hours.  CBG: No results for input(s): GLUCAP in the last 168 hours.  Radiological Exams on Admission: Dg Chest 2 View  11/17/2014   CLINICAL DATA:  Chest pain radiating to the left arm and neck for couple of hr tonight.  EXAM: CHEST  2 VIEW  COMPARISON:  09/27/2014  FINDINGS: Cardiac pacemaker. Normal heart size and pulmonary vascularity. Tortuous and calcified aorta. Mediastinal contours appear intact. Hyperinflation of the lungs consistent with emphysema. Scarring in the apices. Multiple pulmonary nodules on the  left. Query nodules have been present on prior studies, some dating back to 05/19/2008. Correlate with clinical history for possible etiology. Blunting of the right costophrenic angle developing since prior study, suggesting a small pleural effusion. Old right rib fractures.  IMPRESSION: Emphysematous changes and fibrosis in the lungs with multiple pulmonary nodules on the left. Nodules appear unchanged since previous study. New blunting of right costophrenic angle suggesting small developing pleural effusion.   Electronically Signed   By: Lucienne Capers M.D.   On: 11/17/2014 02:08     EKG: Independently reviewed. Normal Sinus Rhythm rate =69 +LBBB   Assessment/Plan:     81 y.o. female with  Principal Problem:   1.    Chest pain   Telemetry   Cycle Troponins   Nitropaste, O2, ASA   Active Problems:   2.    Chronic systolic heart failure   Monitor for Volume Overload     3.    Essential hypertension   Continue Metoprolol, and Diltiazem Rx   Monitor BPs     4.    Sinus brady-tachy syndrome   Recent Pacemaker Placed 09/26/2014   Notify Cards for pacer Check    5.    DVT Prophylaxis    Lovenox     Code Status:      DO NOT RESUSCITATE (DNR)      Family Communication:   Daughter at Bedside    Disposition Plan:    Observation Status        Time spent:  69 70 [place] [address] [facility] Pager [phone]   If [place] Please Contact the Day Rounding Team MD for [facility]  If 7PM-7AM, Please Contact Night-Floor Coverage  [email or web address] Password [email or web address] 11/17/2014, 5:26 AM     ADDENDUM:   Patient was seen and examined on 11/17/2014

## 2014-11-17 NOTE — ED Provider Notes (Signed)
CSN: [id number]     Arrival date & time 11/17/14  0039 History   This chart was scribed for Beth Wlodarczyk, MD by Forrestine Him, ED Scribe. This patient was seen in room B17C/B17C and the patient's care was started 1:06 AM.   Chief Complaint  Patient presents with  . Chest Pain   Patient is a 81 y.o. female presenting with chest pain. The history is provided by the patient. No language interpreter was used.  Chest Pain Pain location:  L chest Pain quality: burning   Pain radiates to:  L shoulder and neck Pain radiates to the back: no   Pain severity:  Moderate Onset quality:  Sudden Timing:  Constant Progression:  Unchanged Chronicity:  New Context: at rest   Relieved by:  Nothing Worsened by:  Nothing tried Ineffective treatments:  None tried Associated symptoms: no abdominal pain, no back pain, no cough, no dizziness, no fever, no headache, no nausea, no numbness, no palpitations, no shortness of breath, not vomiting and no weakness   Risk factors: not obese      HPI Comments: Beth [name] is a 81 y.o. female with a PMHx of HTN and [employer] pacemaker placement-September 26, 2014 who presents to the Emergency Department complaining of constant, ongoing L sided chest pain onset 2 hours prior to arrival. Pain is described as burning surrounding her implanted pacemaker. She also reports pain to her L shoulder and L side of her neck. Diaphoresis reported a few days ago, but denies any today. No OTC/prescribed medications or home remedies attempted prior to arrival. No recent fever, chills, abdominal pain, shortness of breath, rash, or diarrhea. No recent radiation to the skin. Beth [name] is followed by Dr. Cristopher Peru- EP cardiologist. Pt with known allergies to Penicillins, Fexofenadine, and Cipro.  Past Medical History  Diagnosis Date  . Hypertension   . Lung nodule     CT of chest 2007-left loewr lobar mass- evaluated by pulmonary- patient refused further work up  . Tobacco abuse    . Vertigo   . Abdominal pain, unspecified site 08/17/2012  . Other and unspecified hyperlipidemia 08/17/2012  . Loss of weight 01/19/2013  . Allergic state 06/22/2013  . Allergy   . Osteoporosis   . Syncope     fall  . Increased urinary frequency 01/02/2014  . Glaucoma 07/06/2014   Past Surgical History  Procedure Laterality Date  . Cholecystectomy    . Cesarean section    . Hernia repair    . Abdominal hysterectomy    . Ep implantable device N/A 09/26/2014    Procedure: Pacemaker Implant;  Surgeon: Evans Lance, MD;  Location: [facility];  Service: Cardiovascular;  Laterality: N/A;   Family History  Problem Relation Age of Onset  . Heart disease Mother   . Hypertension Mother   . Diabetes Mother   . Diabetes    . Cancer      lung- in distant releatives  . Cancer Brother    Social History  Substance Use Topics  . Smoking status: Current Every Day Smoker -- 0.10 packs/day for 60 years    Types: Cigarettes  . Smokeless tobacco: Never Used  . Alcohol Use: No   OB History    No data available     Review of Systems  Constitutional: Negative for fever and chills.  HENT: Negative for congestion.   Respiratory: Negative for cough and shortness of breath.   Cardiovascular: Positive for chest pain. Negative for  palpitations and leg swelling.  Gastrointestinal: Negative for nausea, vomiting, abdominal pain and diarrhea.  Genitourinary: Negative for dysuria.  Musculoskeletal: Negative for back pain.  Neurological: Negative for dizziness, weakness, numbness and headaches.  Psychiatric/Behavioral: Negative for confusion.  All other systems reviewed and are negative.     Allergies  Ciprofloxacin; Fexofenadine; and Penicillins  Home Medications   Prior to Admission medications   Medication Sig Start Date End Date Taking? Authorizing Provider  aspirin 81 MG chewable tablet Chew 1 tablet (81 mg total) by mouth daily. 07/02/13   Orson Eva, MD  cetirizine (ZYRTEC) 10  MG tablet Take 10 mg by mouth every morning.    Historical Provider, MD  diltiazem (TIAZAC) 240 MG 24 hr capsule TAKE 1 CAPSULE (240 MG TOTAL) BY MOUTH DAILY. 08/28/14   Mosie Lukes, MD  fish oil-omega-3 fatty acids 1000 MG capsule Take 2 g by mouth daily at 12 noon.     Historical Provider, MD  flecainide (TAMBOCOR) 50 MG tablet Take 1 tablet (50 mg total) by mouth every 12 (twelve) hours. 09/27/14   Almyra Deforest, PA  furosemide (LASIX) 20 MG tablet Take 1 tablet (20 mg total) by mouth daily as needed for edema. 11/03/14   Mosie Lukes, MD  losartan (COZAAR) 25 MG tablet Take 1 tablet (25 mg total) by mouth daily. 11/03/14   Mosie Lukes, MD  metoprolol succinate (TOPROL-XL) 25 MG 24 hr tablet TAKE 1 TABLET (25 MG TOTAL) BY MOUTH DAILY. 08/28/14   Mosie Lukes, MD  Multiple Vitamin (MULTIVITAMIN) tablet Take 1 tablet by mouth daily at 12 noon.     Historical Provider, MD  polyethylene glycol (MIRALAX / GLYCOLAX) packet Take 17 g by mouth daily as needed. Patient not taking: Reported on 11/03/2014 09/30/14   Charlynne Cousins, MD  Saline (SODIUM CHLORIDE) 0.65 % SOLN Place 1 spray into the nose as needed for congestion. 2 sprays each nostril once daily as needed    Historical Provider, MD  trimethoprim-polymyxin b (POLYTRIM) ophthalmic solution Place 2 drops into the left eye every 6 (six) hours. Patient not taking: Reported on 11/03/2014 04/11/14   Mosie Lukes, MD   Triage Vitals: BP 172/49 mmHg  Pulse 72  Resp 11  Ht 5\' 1"  (7.741 m)  Wt 106 lb (48.081 kg)  BMI 20.04 kg/m2  SpO2 97%   Physical Exam  Constitutional: She is oriented to person, place, and time. She appears well-developed and well-nourished. No distress.  HENT:  Head: Normocephalic and atraumatic.  Mouth/Throat: Oropharynx is clear and moist.  Eyes: EOM are normal. Pupils are equal, round, and reactive to light.  Neck: Normal range of motion. Neck supple. No JVD present. Carotid bruit is not present.  Cardiovascular: Normal rate,  regular rhythm and normal heart sounds.   Pulmonary/Chest: Effort normal and breath sounds normal. No respiratory distress. She has no wheezes. She has no rales. She exhibits tenderness.  Incision is clean, dry, and intact No noted hematomas No fluctuance Skin is sore to touch  Abdominal: Soft. She exhibits no distension. There is no tenderness. There is no rebound and no guarding.  Hyperactive bowel sounds  Musculoskeletal: Normal range of motion. She exhibits no edema.  Neurological: She is alert and oriented to person, place, and time.  Skin: Skin is warm and dry.  Psychiatric: She has a normal mood and affect. Judgment normal.  Nursing note and vitals reviewed.   ED Course  Procedures (including critical care time)  DIAGNOSTIC  STUDIES: Oxygen Saturation is 98% on RA, Normal by my interpretation.    COORDINATION OF CARE: 12:51 AM- Will order CXR, EKG, BMP, and  i-stat troponin I. Discussed treatment plan with pt at bedside and pt agreed to plan.     Labs Review Labs Reviewed - No data to display  Imaging Review No results found. I have personally reviewed and evaluated these images and lab results as part of my medical decision-making.   EKG Interpretation None      MDM   Final diagnoses:  None     EKG Interpretation  Date/Time:  Monday November 17 2014 00:51:26 EDT Ventricular Rate:  69 PR Interval:  150 QRS Duration: 155 QT Interval:  525 QTC Calculation: 562 R Axis:   -24 Text Interpretation:  Sinus rhythm Left bundle branch block Confirmed by [facility]  MD, Andrez Lieurance ([id number]) on 11/17/2014 1:29:17 AM      Results for orders placed or performed during the hospital encounter of 84/16/60  Basic metabolic panel  Result Value Ref Range   Sodium 139 135 - 145 mmol/L   Potassium 4.1 3.5 - 5.1 mmol/L   Chloride 104 101 - 111 mmol/L   CO2 24 22 - 32 mmol/L   Glucose, Bld 117 (H) 65 - 99 mg/dL   BUN 15 6 - 20 mg/dL   Creatinine, Ser 0.65 0.44 - 1.00 mg/dL    Calcium 9.4 8.9 - 10.3 mg/dL   GFR calc non Af Amer >60 >60 mL/min   GFR calc Af Amer >60 >60 mL/min   Anion gap 11 5 - 15  CBC  Result Value Ref Range   WBC 7.7 4.0 - 10.5 K/uL   RBC 4.18 3.87 - 5.11 MIL/uL   Hemoglobin 12.7 12.0 - 15.0 g/dL   HCT 37.5 36.0 - 46.0 %   MCV 89.7 78.0 - 100.0 fL   MCH 30.4 26.0 - 34.0 pg   MCHC 33.9 30.0 - 36.0 g/dL   RDW 13.7 11.5 - 15.5 %   Platelets 274 150 - 400 K/uL  I-stat chem 8, ed  Result Value Ref Range   Sodium 138 135 - 145 mmol/L   Potassium 4.1 3.5 - 5.1 mmol/L   Chloride 105 101 - 111 mmol/L   BUN 19 6 - 20 mg/dL   Creatinine, Ser 0.60 0.44 - 1.00 mg/dL   Glucose, Bld 112 (H) 65 - 99 mg/dL   Calcium, Ion 1.13 1.13 - 1.30 mmol/L   TCO2 24 0 - 100 mmol/L   Hemoglobin 12.9 12.0 - 15.0 g/dL   HCT 38.0 36.0 - 46.0 %  I-stat troponin, ED  Result Value Ref Range   Troponin i, poc 0.00 0.00 - 0.08 ng/mL   Comment 3           Dg Chest 2 View  11/17/2014   CLINICAL DATA:  Chest pain radiating to the left arm and neck for couple of hr tonight.  EXAM: CHEST  2 VIEW  COMPARISON:  09/27/2014  FINDINGS: Cardiac pacemaker. Normal heart size and pulmonary vascularity. Tortuous and calcified aorta. Mediastinal contours appear intact. Hyperinflation of the lungs consistent with emphysema. Scarring in the apices. Multiple pulmonary nodules on the left. Query nodules have been present on prior studies, some dating back to 05/19/2008. Correlate with clinical history for possible etiology. Blunting of the right costophrenic angle developing since prior study, suggesting a small pleural effusion. Old right rib fractures.  IMPRESSION: Emphysematous changes and fibrosis in the lungs with multiple pulmonary  nodules on the left. Nodules appear unchanged since previous study. New blunting of right costophrenic angle suggesting small developing pleural effusion.   Electronically Signed   By: Lucienne Capers M.D.   On: 11/17/2014 02:08      I personally  performed the services described in this documentation, which was scribed in my presence. The recorded information has been reviewed and is accurate.    Veatrice Kells, MD 11/17/14 [phone]

## 2014-11-18 ENCOUNTER — Observation Stay (HOSPITAL_COMMUNITY): Payer: Medicare Other

## 2014-11-18 DIAGNOSIS — I495 Sick sinus syndrome: Secondary | ICD-10-CM

## 2014-11-18 DIAGNOSIS — I5022 Chronic systolic (congestive) heart failure: Secondary | ICD-10-CM

## 2014-11-18 DIAGNOSIS — R072 Precordial pain: Secondary | ICD-10-CM

## 2014-11-18 DIAGNOSIS — R079 Chest pain, unspecified: Secondary | ICD-10-CM | POA: Diagnosis not present

## 2014-11-18 DIAGNOSIS — I10 Essential (primary) hypertension: Secondary | ICD-10-CM

## 2014-11-18 LAB — BASIC METABOLIC PANEL
ANION GAP: 6 (ref 5–15)
BUN: 12 mg/dL (ref 6–20)
CALCIUM: 8.9 mg/dL (ref 8.9–10.3)
CO2: 26 mmol/L (ref 22–32)
CREATININE: 0.66 mg/dL (ref 0.44–1.00)
Chloride: 106 mmol/L (ref 101–111)
GFR calc Af Amer: >60 mL/min (ref >60)
GLUCOSE: 80 mg/dL (ref 65–99)
Potassium: 3.8 mmol/L (ref 3.5–5.1)
Sodium: 138 mmol/L (ref 135–145)

## 2014-11-18 LAB — CBC
HCT: 37.2 % (ref 36.0–46.0)
HEMOGLOBIN: 12.6 g/dL (ref 12.0–15.0)
MCH: 30.4 pg (ref 26.0–34.0)
MCHC: 33.9 g/dL (ref 30.0–36.0)
MCV: 89.6 fL (ref 78.0–100.0)
PLATELETS: 280 10*3/uL (ref 150–400)
RBC: 4.15 MIL/uL (ref 3.87–5.11)
RDW: 13.7 % (ref 11.5–15.5)
WBC: 5 10*3/uL (ref 4.0–10.5)

## 2014-11-18 MED ORDER — TECHNETIUM TC 99M SESTAMIBI - CARDIOLITE
30.0000 | Freq: Once | INTRAVENOUS | Status: AC | PRN
  Administered 2014-11-18: 30 via INTRAVENOUS

## 2014-11-18 MED ORDER — ATORVASTATIN CALCIUM 10 MG PO TABS: 10.0000 mg | ORAL_TABLET | Freq: Every day | ORAL | Status: DC

## 2014-11-18 MED ORDER — REGADENOSON 0.4 MG/5ML IV SOLN
INTRAVENOUS | Status: AC
  Administered 2014-11-18: 0.4 mg via INTRAVENOUS
  Filled 2014-11-18: qty 5

## 2014-11-18 MED ORDER — TECHNETIUM TC 99M SESTAMIBI GENERIC - CARDIOLITE
10.0000 | Freq: Once | INTRAVENOUS | Status: AC | PRN
  Administered 2014-11-18: 10 via INTRAVENOUS

## 2014-11-18 NOTE — Progress Notes (Signed)
Patient Profile: 81 y.o. female with a history of HTN, aortic insufficiency, PAF on Flecainide and ASA, tachybrady syndrome s/p recent PPM, tobacco abuse and non obst CAD who presented to [facility] last night for left sided burning sensation.  Subjective: No complaints. Currently CP free.   Objective: Vital signs in last 24 hours: Temp:  [97.5 F (36.4 C)-98.1 F (36.7 C)] 97.5 F (36.4 C) (08/23 0500) Pulse Rate:  [60-71] 71 (08/23 0500) Resp:  [15-22] 18 (08/23 0500) BP: (134-157)/(43-53) 146/53 mmHg (08/23 0500) SpO2:  [94 %-98 %] 97 % (08/23 0500) Weight:  [108 lb 12.8 oz (49.351 kg)] 108 lb 12.8 oz (49.351 kg) (08/23 0500) Last BM Date: 11/17/14  Intake/Output from previous day: 08/22 0701 - 08/23 0700 In: 243 [P.O.:240; I.V.:3] Out: -  Intake/Output this shift:    Medications Current Facility-Administered Medications  Medication Dose Route Frequency Provider Last Rate Last Dose  . 0.9 %  sodium chloride infusion  250 mL Intravenous PRN Theressa Millard, MD      . acetaminophen (TYLENOL) tablet 650 mg  650 mg Oral Q6H PRN Theressa Millard, MD       Or  . acetaminophen (TYLENOL) suppository 650 mg  650 mg Rectal Q6H PRN Theressa Millard, MD      . alum & mag hydroxide-simeth (MAALOX/MYLANTA) 200-200-20 MG/5ML suspension 30 mL  30 mL Oral Q6H PRN Theressa Millard, MD      . aspirin EC tablet 325 mg  325 mg Oral Daily Theressa Millard, MD   325 mg at 11/17/14 0900  . calcium-vitamin D (OSCAL WITH D) 500-200 MG-UNIT per tablet 1 tablet  1 tablet Oral Q breakfast Theressa Millard, MD   1 tablet at 11/17/14 1139  . diltiazem (TIAZAC) 24 hr capsule 240 mg  240 mg Oral Daily Theressa Millard, MD   240 mg at 11/17/14 0902  . enoxaparin (LOVENOX) injection 40 mg  40 mg Subcutaneous Daily Theressa Millard, MD   40 mg at 11/17/14 1729  . flecainide (TAMBOCOR) tablet 50 mg  50 mg Oral Q12H Theressa Millard, MD   50 mg at 11/17/14 2219  . HYDROmorphone (DILAUDID)  injection 0.5-1 mg  0.5-1 mg Intravenous Q3H PRN Theressa Millard, MD      . loratadine (CLARITIN) tablet 10 mg  10 mg Oral Daily Theressa Millard, MD   10 mg at 11/17/14 0900  . metoprolol succinate (TOPROL-XL) 24 hr tablet 25 mg  25 mg Oral Daily Theressa Millard, MD   25 mg at 11/17/14 0908  . multivitamin with minerals tablet 1 tablet  1 tablet Oral Q1200 Theressa Millard, MD   1 tablet at 11/17/14 1139  . omega-3 acid ethyl esters (LOVAZA) capsule 2 g  2 g Oral Q1200 Theressa Millard, MD   2 g at 11/17/14 1139  . ondansetron (ZOFRAN) tablet 4 mg  4 mg Oral Q6H PRN Theressa Millard, MD       Or  . ondansetron (ZOFRAN) injection 4 mg  4 mg Intravenous Q6H PRN Theressa Millard, MD      . oxyCODONE (Oxy IR/ROXICODONE) immediate release tablet 5 mg  5 mg Oral Q4H PRN Theressa Millard, MD      . regadenoson (LEXISCAN) 0.4 MG/5ML injection SOLN           . regadenoson (LEXISCAN) injection SOLN 0.4 mg  0.4 mg Intravenous Once Eileen Stanford, PA-C      .  simvastatin (ZOCOR) tablet 20 mg  20 mg Oral q1800 Kinnie Feil, MD   20 mg at 11/17/14 1730  . sodium chloride 0.9 % injection 3 mL  3 mL Intravenous Q12H Theressa Millard, MD   3 mL at 11/17/14 2220  . sodium chloride 0.9 % injection 3 mL  3 mL Intravenous Q12H Theressa Millard, MD   3 mL at 11/17/14 2220  . sodium chloride 0.9 % injection 3 mL  3 mL Intravenous PRN Theressa Millard, MD        PE: General appearance: alert, cooperative and no distress Neck: no carotid bruit and no JVD Lungs: clear to auscultation bilaterally Heart: regular rate and rhythm, S1, S2 normal, no murmur, click, rub or gallop Extremities: no LEE Pulses: 2+ and symmetric Skin: warm and dry Neurologic: Grossly normal  Lab Results:   Recent Labs  11/17/14 0118 11/17/14 0142 11/18/14 0535  WBC 7.7  --  5.0  HGB 12.7 12.9 12.6  HCT 37.5 38.0 37.2  PLT 274  --  280   BMET  Recent Labs  11/17/14 0118 11/17/14 0142  11/18/14 0535  NA 139 138 138  K 4.1 4.1 3.8  CL 104 105 106  CO2 24  --  26  GLUCOSE 117* 112* 80  BUN 15 19 12   CREATININE 0.65 0.60 0.66  CALCIUM 9.4  --  8.9   Cardiac Panel (last 3 results)  Recent Labs  11/17/14 0927 11/17/14 1504 11/17/14 2032  TROPONINI <0.03 <0.03 <0.03    Studies/Results: NST- pending   Assessment/Plan  Principal Problem:   Chest pain Active Problems:   Essential hypertension   Sinus brady-tachy syndrome   Chronic systolic heart failure     Chest pain- atypical. Burning sensation on her left side worse when supine. Pain radiates into her neck and head.  --Cardiac enzymes negative x 3. ECG with no acute ST or TW changes. LBBB that is old.  -NST pending.  PAF- currently maintaining NSR -- CHADSVASC score of at least 4. Not a long term AC candidate due to falls. Continue ASA -- Continue flecainide 50 mg BID, dilt 240mg  qd, metoprolol XL 25mg  qd  Tachybrady s/p PPM (09/26/2014) - pacemaker site a little tender but does not appear infected. No white count or fever.   HTN- continue dilt 240mg  qd and Toprol XL 25mg  qd  Chronic systolic CHF- EF 88-82%. She admits to orthopnea. CXR with possible developing R pleural effusion. Will add BNP.  Pulmonary nodules- this has been documented before and she has refused further work up. Stable by CXR this admission.     Brittainy M. Rosita Fire, PA-C 11/18/2014 9:24 AM  I have seen and examined the patient along with Brittainy M. Rosita Fire, PA-C.  I have reviewed the chart, notes and new data.  I agree with PA's note.  Key new complaints: pain has resolved Key examination changes: healthy pacemaker site Key new findings / data: normal enzymes  PLAN: Reviewed images. On my review, there is small septal fixed mild defect, highly consistent with LBBB related artifact. No ischemia is seen. Suspect non cardiac pain. No further inpatient evaluation is recommended for heart disease.  Sanda Klein, MD,  [place] [phone] 11/18/2014, 12:38 PM

## 2014-11-18 NOTE — Discharge Summary (Signed)
Physician Discharge Summary  Beth [name] YHC:[id number] DOB: [birth year] DOA: 11/17/2014  PCP: Penni Homans, MD  Admit date: 11/17/2014 Discharge date: 11/18/2014  Time spent: 40 minutes  Recommendations for Outpatient Follow-up:  1. Follow-up with primary care physician within one week.  Discharge Diagnoses:  Principal Problem:   Chest pain Active Problems:   Essential hypertension   Sinus brady-tachy syndrome   Chronic systolic heart failure   Discharge Condition: Stable  Diet recommendation: Heart healthy  Filed Weights   11/17/14 0051 11/18/14 0500  Weight: 48.081 kg (106 lb) 49.351 kg (108 lb 12.8 oz)    History of present illness:  Beth [name] is a 81 y.o. female with history of HTN and recent pacemaker placement on 09/26/2014 who presents to the ED with complaints of burning chest pain around her pacemaker that radiates into her neck and face associated with SOB and diaphoresis intermittently x 2-3 days. She was seen in the ED and was referred for a chest pain rule out. The initial troponin was negative.   Hospital Course:   1. Chest pain Admitted initially to telemetry, has no chest pain while she was in the hospital. The pain was more left shoulder pain, around the pacemaker pocket tingling/burning. Seen by cardiology, she has had new left bundle branch block since last time she had EKG. The LBBB is not secondary to the pacemaker. Stress test was done earlier today 8/23 and per Dr. Loletha Grayer, small septal fixed minor defect, consistent with the LBBB related artifact. Per cards this is noncardiac pain no more cardiac workup, patient to be discharged.   Active Problems:  2. Chronic systolic heart failure Monitor for Volume Overload    3. Essential hypertension Continue Metoprolol, and Diltiazem Rx Monitor BPs    4. Sinus brady-tachy  syndrome Recent Pacemaker Placed 09/26/2014 Notify Cards for pacer Check  Procedures: Stress Tes, no reversible ischemia low risk stress test findings   Consultations:  Cardiology  Discharge Exam: Filed Vitals:   11/18/14 1400  BP: 147/65  Pulse: 92  Temp: 98 F (36.7 C)  Resp:    General: Alert and awake, oriented x3, not in any acute distress. HEENT: anicteric sclera, pupils reactive to light and accommodation, EOMI CVS: S1-S2 clear, no murmur rubs or gallops Chest: clear to auscultation bilaterally, no wheezing, rales or rhonchi Abdomen: soft nontender, nondistended, normal bowel sounds, no organomegaly Extremities: no cyanosis, clubbing or edema noted bilaterally Neuro: Cranial nerves II-XII intact, no focal neurological deficits  Discharge Instructions   Discharge Instructions    Diet - low sodium heart healthy    Complete by:  As directed      Increase activity slowly    Complete by:  As directed           Current Discharge Medication List    CONTINUE these medications which have NOT CHANGED   Details  aspirin 81 MG chewable tablet Chew 1 tablet (81 mg total) by mouth daily. Qty: 30 tablet, Refills: 11    calcium-vitamin D (OSCAL WITH D) 500-200 MG-UNIT per tablet Take 1 tablet by mouth daily with breakfast.    cetirizine (ZYRTEC) 10 MG tablet Take 10 mg by mouth every morning.    diclofenac (FLECTOR) 1.3 % PTCH Place 1 patch onto the skin daily as needed (pain).    diltiazem (TIAZAC) 240 MG 24 hr capsule TAKE 1 CAPSULE (240 MG TOTAL) BY MOUTH DAILY. Qty: 30 capsule, Refills: 6    fish oil-omega-3 fatty acids 1000 MG capsule  Take 2 g by mouth daily at 12 noon.     flecainide (TAMBOCOR) 50 MG tablet Take 1 tablet (50 mg total) by mouth every 12 (twelve) hours. Qty: 60 tablet, Refills: 5    furosemide (LASIX) 20 MG tablet Take 1 tablet (20 mg total) by mouth daily as needed for edema. Qty: 20 tablet,  Refills: 1   Associated Diagnoses: Pedal edema    losartan (COZAAR) 25 MG tablet Take 1 tablet (25 mg total) by mouth daily. Qty: 30 tablet, Refills: 6    metoprolol succinate (TOPROL-XL) 25 MG 24 hr tablet TAKE 1 TABLET (25 MG TOTAL) BY MOUTH DAILY. Qty: 90 tablet, Refills: 1    Multiple Vitamin (MULTIVITAMIN) tablet Take 1 tablet by mouth daily at 12 noon.     Saline (SODIUM CHLORIDE) 0.65 % SOLN Place 1 spray into the nose as needed for congestion. 2 sprays each nostril once daily as needed    Wheat Dextrin (BENEFIBER PO) Take 1 Applicatorful by mouth daily.       Allergies  Allergen Reactions  . Penicillins Shortness Of Breath, Diarrhea and Other (See Comments)    Stomach cramping  . Ciprofloxacin Other (See Comments)    myalgias  . Fexofenadine Other (See Comments)    unknown   Follow-up Information    Follow up with Penni Homans, MD In 1 week.   Specialty:  Family Medicine   Contact information:   [address] [place] [address] [phone]        The results of significant diagnostics from this hospitalization (including imaging, microbiology, ancillary and laboratory) are listed below for reference.    Significant Diagnostic Studies: Dg Chest 2 View  11/17/2014   CLINICAL DATA:  Chest pain radiating to the left arm and neck for couple of hr tonight.  EXAM: CHEST  2 VIEW  COMPARISON:  09/27/2014  FINDINGS: Cardiac pacemaker. Normal heart size and pulmonary vascularity. Tortuous and calcified aorta. Mediastinal contours appear intact. Hyperinflation of the lungs consistent with emphysema. Scarring in the apices. Multiple pulmonary nodules on the left. Query nodules have been present on prior studies, some dating back to 05/19/2008. Correlate with clinical history for possible etiology. Blunting of the right costophrenic angle developing since prior study, suggesting a small pleural effusion. Old right rib fractures.  IMPRESSION: Emphysematous  changes and fibrosis in the lungs with multiple pulmonary nodules on the left. Nodules appear unchanged since previous study. New blunting of right costophrenic angle suggesting small developing pleural effusion.   Electronically Signed   By: Lucienne Capers M.D.   On: 11/17/2014 02:08   Nm Myocar Multi W/spect W/wall Motion / Ef  11/18/2014   CLINICAL DATA:  81 year old with chest pain.  EXAM: MYOCARDIAL IMAGING WITH SPECT (REST AND PHARMACOLOGIC-STRESS)  GATED LEFT VENTRICULAR WALL MOTION STUDY  LEFT VENTRICULAR EJECTION FRACTION  TECHNIQUE: Standard myocardial SPECT imaging was performed after resting intravenous injection of 10 mCi Tc-46m sestamibi. Subsequently, intravenous infusion of Lexiscan was performed under the supervision of the Cardiology staff. At peak effect of the drug, 30 mCi Tc-46m sestamibi was injected intravenously and standard myocardial SPECT imaging was performed. Quantitative gated imaging was also performed to evaluate left ventricular wall motion, and estimate left ventricular ejection fraction.  COMPARISON:  Chest radiograph 11/17/2014  FINDINGS: Perfusion: No decreased activity in the left ventricle on stress imaging to suggest reversible ischemia or infarction.  Wall Motion: Normal left ventricular wall motion. No left ventricular dilation.  Left Ventricular Ejection  Fraction: 61 %  End diastolic volume 52 ml  End systolic volume 20 ml  IMPRESSION: 1. No reversible ischemia or infarction.  2. Normal left ventricular wall motion.  3. Left ventricular ejection fraction 61%.  4. Low-risk stress test findings*.  *2012 Appropriate Use Criteria for Coronary Revascularization Focused Update: J Am Coll Cardiol. 2751;70(0):174-944. http://content.airportbarriers.com.aspx?articleid=[email or web address]   Electronically Signed   By: Markus Daft M.D.   On: 11/18/2014 13:45    Microbiology: No results found for this or any previous visit (from the past 240 hour(s)).   Labs: Basic Metabolic  Panel:  Recent Labs Lab 11/17/14 0118 11/17/14 0142 11/18/14 0535  NA 139 138 138  K 4.1 4.1 3.8  CL 104 105 106  CO2 24  --  26  GLUCOSE 117* 112* 80  BUN 15 19 12   CREATININE 0.65 0.60 0.66  CALCIUM 9.4  --  8.9   Liver Function Tests: No results for input(s): AST, ALT, ALKPHOS, BILITOT, PROT, ALBUMIN in the last 168 hours. No results for input(s): LIPASE, AMYLASE in the last 168 hours. No results for input(s): AMMONIA in the last 168 hours. CBC:  Recent Labs Lab 11/17/14 0118 11/17/14 0142 11/18/14 0535  WBC 7.7  --  5.0  HGB 12.7 12.9 12.6  HCT 37.5 38.0 37.2  MCV 89.7  --  89.6  PLT 274  --  280   Cardiac Enzymes:  Recent Labs Lab 11/17/14 0927 11/17/14 1504 11/17/14 2032  TROPONINI <0.03 <0.03 <0.03   BNP: BNP (last 3 results)  Recent Labs  11/17/14 1500  BNP 440.7*    ProBNP (last 3 results) No results for input(s): PROBNP in the last 8760 hours.  CBG: No results for input(s): GLUCAP in the last 168 hours.     Signed:  Arayah Krouse A  [facility] 11/18/2014, 3:34 PM

## 2014-11-19 ENCOUNTER — Telehealth: Payer: Self-pay | Admitting: *Deleted

## 2014-11-19 NOTE — Telephone Encounter (Signed)
Transition Care Management Follow-up Telephone Call   Date discharged? 11/18/14   How have you been since you were released from the hospital?  No chest pain, no shortness of breath "I have not been doing much."     Do you understand why you were in the hospital? "I'm really not sure, but I did have a stress test.  It seems like there might have been a problem with my pacemaker."     Do you understand the discharge instructions? YES    Where were you discharged to? Home with home health physical therapy, states has an aid to help with bathing    Items Reviewed:  Medications reviewed: YES   Allergies reviewed: YES   Dietary changes reviewed: YES   Referrals reviewed: YES   Functional Questionnaire:   Activities of Daily Living (ADLs):   She states they are independent in the following: ambulation, dressing, restroom,   States they require assistance with the following: bathing (has aid), transportation   Any transportation issues/concerns?:  NO- daughter will bring to her appointment    Any patient concerns? NO   Confirmed importance and date/time of follow-up visits scheduled 12/04/14    Provider Appointment booked with Dr. Charlett Blake   Confirmed with patient if condition begins to worsen call PCP or go to the ER.  Patient was given the office number and encouraged to call back with question or concerns.  : YES

## 2014-11-20 DIAGNOSIS — M810 Age-related osteoporosis without current pathological fracture: Secondary | ICD-10-CM | POA: Diagnosis not present

## 2014-11-20 DIAGNOSIS — Z48812 Encounter for surgical aftercare following surgery on the circulatory system: Secondary | ICD-10-CM | POA: Diagnosis not present

## 2014-11-20 DIAGNOSIS — I5032 Chronic diastolic (congestive) heart failure: Secondary | ICD-10-CM | POA: Diagnosis not present

## 2014-11-20 DIAGNOSIS — I10 Essential (primary) hypertension: Secondary | ICD-10-CM | POA: Diagnosis not present

## 2014-11-20 DIAGNOSIS — I4891 Unspecified atrial fibrillation: Secondary | ICD-10-CM | POA: Diagnosis not present

## 2014-11-20 DIAGNOSIS — M545 Low back pain: Secondary | ICD-10-CM | POA: Diagnosis not present

## 2014-11-24 ENCOUNTER — Other Ambulatory Visit: Payer: Self-pay | Admitting: Family Medicine

## 2014-11-24 ENCOUNTER — Other Ambulatory Visit: Payer: Self-pay | Admitting: Adult Health

## 2014-11-26 DIAGNOSIS — I4891 Unspecified atrial fibrillation: Secondary | ICD-10-CM | POA: Diagnosis not present

## 2014-11-26 DIAGNOSIS — M545 Low back pain: Secondary | ICD-10-CM | POA: Diagnosis not present

## 2014-11-26 DIAGNOSIS — I10 Essential (primary) hypertension: Secondary | ICD-10-CM | POA: Diagnosis not present

## 2014-11-26 DIAGNOSIS — M810 Age-related osteoporosis without current pathological fracture: Secondary | ICD-10-CM | POA: Diagnosis not present

## 2014-11-26 DIAGNOSIS — Z48812 Encounter for surgical aftercare following surgery on the circulatory system: Secondary | ICD-10-CM | POA: Diagnosis not present

## 2014-11-26 DIAGNOSIS — I5032 Chronic diastolic (congestive) heart failure: Secondary | ICD-10-CM | POA: Diagnosis not present

## 2014-11-27 DIAGNOSIS — I10 Essential (primary) hypertension: Secondary | ICD-10-CM | POA: Diagnosis not present

## 2014-11-27 DIAGNOSIS — I4891 Unspecified atrial fibrillation: Secondary | ICD-10-CM | POA: Diagnosis not present

## 2014-11-27 DIAGNOSIS — Z48812 Encounter for surgical aftercare following surgery on the circulatory system: Secondary | ICD-10-CM | POA: Diagnosis not present

## 2014-11-27 DIAGNOSIS — M810 Age-related osteoporosis without current pathological fracture: Secondary | ICD-10-CM | POA: Diagnosis not present

## 2014-11-27 DIAGNOSIS — I5032 Chronic diastolic (congestive) heart failure: Secondary | ICD-10-CM | POA: Diagnosis not present

## 2014-11-27 DIAGNOSIS — M545 Low back pain: Secondary | ICD-10-CM | POA: Diagnosis not present

## 2014-11-28 ENCOUNTER — Telehealth: Payer: Self-pay | Admitting: Family Medicine

## 2014-11-28 DIAGNOSIS — I10 Essential (primary) hypertension: Secondary | ICD-10-CM | POA: Diagnosis not present

## 2014-11-28 DIAGNOSIS — Z48812 Encounter for surgical aftercare following surgery on the circulatory system: Secondary | ICD-10-CM | POA: Diagnosis not present

## 2014-11-28 DIAGNOSIS — M810 Age-related osteoporosis without current pathological fracture: Secondary | ICD-10-CM | POA: Diagnosis not present

## 2014-11-28 DIAGNOSIS — I4891 Unspecified atrial fibrillation: Secondary | ICD-10-CM | POA: Diagnosis not present

## 2014-11-28 DIAGNOSIS — I5032 Chronic diastolic (congestive) heart failure: Secondary | ICD-10-CM | POA: Diagnosis not present

## 2014-11-28 DIAGNOSIS — M545 Low back pain: Secondary | ICD-10-CM | POA: Diagnosis not present

## 2014-11-28 NOTE — Telephone Encounter (Signed)
Caller name: Radovan with [facility], OT Relationship to patient: Can be reached: [phone]  Reason for call: pt will be d/c from OT today - hit all goals for OT - pt to continue PT in home

## 2014-12-02 DIAGNOSIS — I10 Essential (primary) hypertension: Secondary | ICD-10-CM | POA: Diagnosis not present

## 2014-12-02 DIAGNOSIS — M545 Low back pain: Secondary | ICD-10-CM | POA: Diagnosis not present

## 2014-12-02 DIAGNOSIS — I5032 Chronic diastolic (congestive) heart failure: Secondary | ICD-10-CM | POA: Diagnosis not present

## 2014-12-02 DIAGNOSIS — I4891 Unspecified atrial fibrillation: Secondary | ICD-10-CM | POA: Diagnosis not present

## 2014-12-02 DIAGNOSIS — Z48812 Encounter for surgical aftercare following surgery on the circulatory system: Secondary | ICD-10-CM | POA: Diagnosis not present

## 2014-12-02 DIAGNOSIS — M810 Age-related osteoporosis without current pathological fracture: Secondary | ICD-10-CM | POA: Diagnosis not present

## 2014-12-04 ENCOUNTER — Ambulatory Visit (INDEPENDENT_AMBULATORY_CARE_PROVIDER_SITE_OTHER): Payer: Medicare Other | Admitting: Family Medicine

## 2014-12-04 ENCOUNTER — Encounter: Payer: Self-pay | Admitting: Family Medicine

## 2014-12-04 VITALS — BP 130/62 | HR 88 | Temp 98.1°F | Ht 61.0 in | Wt 105.5 lb

## 2014-12-04 DIAGNOSIS — W57XXXA Bitten or stung by nonvenomous insect and other nonvenomous arthropods, initial encounter: Secondary | ICD-10-CM

## 2014-12-04 DIAGNOSIS — G44209 Tension-type headache, unspecified, not intractable: Secondary | ICD-10-CM

## 2014-12-04 DIAGNOSIS — R072 Precordial pain: Secondary | ICD-10-CM | POA: Diagnosis not present

## 2014-12-04 DIAGNOSIS — I4891 Unspecified atrial fibrillation: Secondary | ICD-10-CM | POA: Diagnosis not present

## 2014-12-04 DIAGNOSIS — T148 Other injury of unspecified body region: Secondary | ICD-10-CM | POA: Diagnosis not present

## 2014-12-04 DIAGNOSIS — R21 Rash and other nonspecific skin eruption: Secondary | ICD-10-CM | POA: Diagnosis not present

## 2014-12-04 DIAGNOSIS — E785 Hyperlipidemia, unspecified: Secondary | ICD-10-CM

## 2014-12-04 DIAGNOSIS — I10 Essential (primary) hypertension: Secondary | ICD-10-CM | POA: Diagnosis not present

## 2014-12-04 DIAGNOSIS — I5022 Chronic systolic (congestive) heart failure: Secondary | ICD-10-CM | POA: Diagnosis not present

## 2014-12-04 MED ORDER — FLECAINIDE ACETATE 50 MG PO TABS: 50.0000 mg | ORAL_TABLET | Freq: Two times a day (BID) | ORAL | Status: DC

## 2014-12-04 MED ORDER — METOPROLOL SUCCINATE ER 25 MG PO TB24: ORAL_TABLET | ORAL | Status: DC

## 2014-12-04 MED ORDER — LOSARTAN POTASSIUM 25 MG PO TABS: 25.0000 mg | ORAL_TABLET | Freq: Every day | ORAL | Status: DC

## 2014-12-04 MED ORDER — DILTIAZEM HCL ER BEADS 240 MG PO CP24: ORAL_CAPSULE | ORAL | Status: DC

## 2014-12-04 NOTE — Progress Notes (Signed)
Pre visit review using our clinic review tool, if applicable. No additional management support is needed unless otherwise documented below in the visit note. 

## 2014-12-04 NOTE — Patient Instructions (Addendum)
Salon pas gel or patches   Hypokalemia Hypokalemia means that the amount of potassium in the blood is lower than normal.Potassium is a chemical, called an electrolyte, that helps regulate the amount of fluid in the body. It also stimulates muscle contraction and helps nerves function properly.Most of the body's potassium is inside of cells, and only a very small amount is in the blood. Because the amount in the blood is so small, minor changes can be life-threatening. CAUSES  Antibiotics.  Diarrhea or vomiting.  Using laxatives too much, which can cause diarrhea.  Chronic kidney disease.  Water pills (diuretics).  Eating disorders (bulimia).  Low magnesium level.  Sweating a lot. SIGNS AND SYMPTOMS  Weakness.  Constipation.  Fatigue.  Muscle cramps.  Mental confusion.  Skipped heartbeats or irregular heartbeat (palpitations).  Tingling or numbness. DIAGNOSIS  Your health care provider can diagnose hypokalemia with blood tests. In addition to checking your potassium level, your health care provider may also check other lab tests. TREATMENT Hypokalemia can be treated with potassium supplements taken by mouth or adjustments in your current medicines. If your potassium level is very low, you may need to get potassium through a vein (IV) and be monitored in the hospital. A diet high in potassium is also helpful. Foods high in potassium are:  Nuts, such as peanuts and pistachios.  Seeds, such as sunflower seeds and pumpkin seeds.  Peas, lentils, and lima beans.  Whole grain and bran cereals and breads.  Fresh fruit and vegetables, such as apricots, avocado, bananas, cantaloupe, kiwi, oranges, tomatoes, asparagus, and potatoes.  Orange and tomato juices.  Red meats.  Fruit yogurt. HOME CARE INSTRUCTIONS  Take all medicines as prescribed by your health care provider.  Maintain a healthy diet by including nutritious food, such as fruits, vegetables, nuts, whole  grains, and lean meats.  If you are taking a laxative, be sure to follow the directions on the label. SEEK MEDICAL CARE IF:  Your weakness gets worse.  You feel your heart pounding or racing.  You are vomiting or having diarrhea.  You are diabetic and having trouble keeping your blood glucose in the normal range. SEEK IMMEDIATE MEDICAL CARE IF:  You have chest pain, shortness of breath, or dizziness.  You are vomiting or having diarrhea for more than 2 days.  You faint. MAKE SURE YOU:   Understand these instructions.  Will watch your condition.  Will get help right away if you are not doing well or get worse. Document Released: 03/14/2005 Document Revised: 01/02/2013 Document Reviewed: 09/14/2012 [name] Patient Information 2015 [place], [place]. This information is not intended to replace advice given to you by your health care provider. Make sure you discuss any questions you have with your health care provider. Hyperkalemia Hyperkalemia is when you have too much potassium in your blood. This can be a life-threatening condition. Potassium is normally removed (excreted) from the body by the kidneys. CAUSES  The potassium level in your body can become too high for the following reasons:  You take in too much potassium. You can do this by:  Using salt substitutes. They contain large amounts of potassium.  Taking potassium supplements from your caregiver. The dose may be too high for you.  Eating foods or taking nutritional products with potassium.  You excrete too little potassium. This can happen if:  Your kidneys are not functioning properly. Kidney (renal) disease is a very common cause of hyperkalemia.  You are taking medicines that lower your excretion  of potassium, such as certain diuretic medicines.  You have an adrenal gland disease called Addison's disease.  You have a urinary tract obstruction, such as kidney stones.  You are on treatment to mechanically  clean your blood (dialysis) and you skip a treatment.  You release a high amount of potassium from your cells into your blood. You may have a condition that causes potassium to move from your cells to your bloodstream. This can happen with:  Injury to muscles or other tissues. Most potassium is stored in the muscles.  Severe burns or infections.  Acidic blood plasma (acidosis). Acidosis can result from many diseases, such as uncontrolled diabetes. SYMPTOMS  Usually, there are no symptoms unless the potassium is dangerously high or has risen very quickly. Symptoms may include:  Irregular or very slow heartbeat.  Feeling sick to your stomach (nauseous).  Tiredness (fatigue).  Nerve problems such as tingling of the skin, numbness of the hands or feet, weakness, or paralysis. DIAGNOSIS  A simple blood test can measure the amount of potassium in your body. An electrocardiogram test of the heart can also help make the diagnosis. The heart may beat dangerously fast or slow down and stop beating with severe hyperkalemia.  TREATMENT  Treatment depends on how bad the condition is and on the underlying cause.  If the hyperkalemia is an emergency (causing heart problems or paralysis), many different medicines can be used alone or together to lower the potassium level briefly. This may include an insulin injection even if you are not diabetic. Emergency dialysis may be needed to remove potassium from the body.  If the hyperkalemia is less severe or dangerous, the underlying cause is treated. This can include taking medicines if needed. Your prescription medicines may be changed. You may also need to take a medicine to help your body get rid of potassium. You may need to eat a diet low in potassium. HOME CARE INSTRUCTIONS   Take medicines and supplements as directed by your caregiver.  Do not take any over-the-counter medicines, supplements, natural products, herbs, or vitamins without reviewing  them with your caregiver. Certain supplements and natural food products can have high amounts of potassium. Other products (such as ibuprofen) can damage weak kidneys and raise your potassium.  You may be asked to do repeat lab tests. Be sure to follow these directions.  If you have kidney disease, you may need to follow a low potassium diet. SEEK MEDICAL CARE IF:   You notice an irregular or very slow heartbeat.  You feel lightheaded.  You develop weakness that is unusual for you. SEEK IMMEDIATE MEDICAL CARE IF:   You have shortness of breath.  You have chest discomfort.  You pass out (faint). MAKE SURE YOU:   Understand these instructions.  Will watch your condition.  Will get help right away if you are not doing well or get worse. Document Released: 03/04/2002 Document Revised: 06/06/2011 Document Reviewed: 06/19/2013 [facility] Patient Information 2015 [place], [place]. This information is not intended to replace advice given to you by your health care provider. Make sure you discuss any questions you have with your health care provider.

## 2014-12-05 DIAGNOSIS — M810 Age-related osteoporosis without current pathological fracture: Secondary | ICD-10-CM | POA: Diagnosis not present

## 2014-12-05 DIAGNOSIS — Z48812 Encounter for surgical aftercare following surgery on the circulatory system: Secondary | ICD-10-CM | POA: Diagnosis not present

## 2014-12-05 DIAGNOSIS — I5032 Chronic diastolic (congestive) heart failure: Secondary | ICD-10-CM | POA: Diagnosis not present

## 2014-12-05 DIAGNOSIS — M545 Low back pain: Secondary | ICD-10-CM | POA: Diagnosis not present

## 2014-12-05 DIAGNOSIS — I4891 Unspecified atrial fibrillation: Secondary | ICD-10-CM | POA: Diagnosis not present

## 2014-12-05 DIAGNOSIS — I10 Essential (primary) hypertension: Secondary | ICD-10-CM | POA: Diagnosis not present

## 2014-12-05 LAB — COMPREHENSIVE METABOLIC PANEL
ALT: 12 U/L (ref 0–35)
AST: 20 U/L (ref 0–37)
Albumin: 4.3 g/dL (ref 3.5–5.2)
Alkaline Phosphatase: 75 U/L (ref 39–117)
BUN: 17 mg/dL (ref 6–23)
CALCIUM: 10.1 mg/dL (ref 8.4–10.5)
CHLORIDE: 98 meq/L (ref 96–112)
CO2: 28 meq/L (ref 19–32)
Creatinine, Ser: 0.67 mg/dL (ref 0.40–1.20)
GFR: 88.1 mL/min (ref >60.00)
GLUCOSE: 95 mg/dL (ref 70–99)
Potassium: 4.3 mEq/L (ref 3.5–5.1)
Sodium: 134 mEq/L — ABNORMAL LOW (ref 135–145)
Total Bilirubin: 0.5 mg/dL (ref 0.2–1.2)
Total Protein: 8 g/dL (ref 6.0–8.3)

## 2014-12-05 LAB — LYME ABY, WSTRN BLT IGG & IGM W/BANDS
B BURGDORFERI IGM ABS (IB): NEGATIVE
B burgdorferi IgG Abs (IB): NEGATIVE
LYME DISEASE 28 KD IGG: NONREACTIVE
LYME DISEASE 30 KD IGG: NONREACTIVE
LYME DISEASE 39 KD IGG: NONREACTIVE
LYME DISEASE 41 KD IGG: NONREACTIVE
LYME DISEASE 45 KD IGG: NONREACTIVE
LYME DISEASE 58 KD IGG: NONREACTIVE
LYME DISEASE 66 KD IGG: NONREACTIVE
LYME DISEASE 93 KD IGG: NONREACTIVE
Lyme Disease 18 kD IgG: NONREACTIVE
Lyme Disease 23 kD IgG: NONREACTIVE
Lyme Disease 23 kD IgM: NONREACTIVE
Lyme Disease 39 kD IgM: NONREACTIVE
Lyme Disease 41 kD IgM: NONREACTIVE

## 2014-12-06 LAB — EHRLICHIA ANTIBODY PANEL

## 2014-12-08 DIAGNOSIS — I4891 Unspecified atrial fibrillation: Secondary | ICD-10-CM | POA: Diagnosis not present

## 2014-12-08 DIAGNOSIS — M810 Age-related osteoporosis without current pathological fracture: Secondary | ICD-10-CM | POA: Diagnosis not present

## 2014-12-08 DIAGNOSIS — Z48812 Encounter for surgical aftercare following surgery on the circulatory system: Secondary | ICD-10-CM | POA: Diagnosis not present

## 2014-12-08 DIAGNOSIS — I10 Essential (primary) hypertension: Secondary | ICD-10-CM | POA: Diagnosis not present

## 2014-12-08 DIAGNOSIS — I5032 Chronic diastolic (congestive) heart failure: Secondary | ICD-10-CM | POA: Diagnosis not present

## 2014-12-08 DIAGNOSIS — M545 Low back pain: Secondary | ICD-10-CM | POA: Diagnosis not present

## 2014-12-08 LAB — ROCKY MTN SPOTTED FVR AB, IGG-BLOOD: RMSF IgG: 0.14 IV

## 2014-12-10 DIAGNOSIS — I4891 Unspecified atrial fibrillation: Secondary | ICD-10-CM | POA: Diagnosis not present

## 2014-12-10 DIAGNOSIS — I5032 Chronic diastolic (congestive) heart failure: Secondary | ICD-10-CM | POA: Diagnosis not present

## 2014-12-10 DIAGNOSIS — M810 Age-related osteoporosis without current pathological fracture: Secondary | ICD-10-CM | POA: Diagnosis not present

## 2014-12-10 DIAGNOSIS — M545 Low back pain: Secondary | ICD-10-CM | POA: Diagnosis not present

## 2014-12-10 DIAGNOSIS — I10 Essential (primary) hypertension: Secondary | ICD-10-CM | POA: Diagnosis not present

## 2014-12-10 DIAGNOSIS — Z48812 Encounter for surgical aftercare following surgery on the circulatory system: Secondary | ICD-10-CM | POA: Diagnosis not present

## 2014-12-17 DIAGNOSIS — M810 Age-related osteoporosis without current pathological fracture: Secondary | ICD-10-CM | POA: Diagnosis not present

## 2014-12-17 DIAGNOSIS — I4891 Unspecified atrial fibrillation: Secondary | ICD-10-CM | POA: Diagnosis not present

## 2014-12-17 DIAGNOSIS — I5032 Chronic diastolic (congestive) heart failure: Secondary | ICD-10-CM | POA: Diagnosis not present

## 2014-12-17 DIAGNOSIS — M545 Low back pain: Secondary | ICD-10-CM | POA: Diagnosis not present

## 2014-12-17 DIAGNOSIS — I10 Essential (primary) hypertension: Secondary | ICD-10-CM | POA: Diagnosis not present

## 2014-12-17 DIAGNOSIS — Z48812 Encounter for surgical aftercare following surgery on the circulatory system: Secondary | ICD-10-CM | POA: Diagnosis not present

## 2014-12-20 ENCOUNTER — Encounter: Payer: Self-pay | Admitting: Family Medicine

## 2014-12-20 NOTE — Assessment & Plan Note (Signed)
Well controlled, no changes to meds. Encouraged heart healthy diet such as the DASH diet and exercise as tolerated.  °

## 2014-12-20 NOTE — Progress Notes (Signed)
Subjective:    Patient ID: Beth [name], female    DOB: [birth year], 81 y.o.   MRN: [id number]  Chief Complaint  Patient presents with  . Hospitalization Follow-up    HPI Patient is in today for hospital follow-up. She was hospitalized with atypical chest pain. Workup in hospital was unable to find a cause but she has had no further chest pain since returning home. She reports generally feeling well today. No other recent illness or acute concerns. Denies palp/SOB/HA/congestion/fevers/GI or GU c/o. Taking meds as prescribed  Past Medical History  Diagnosis Date  . Hypertension   . Lung nodule     CT of chest 2007-left loewr lobar mass- evaluated by pulmonary- patient refused further work up  . Tobacco abuse   . Vertigo   . Abdominal pain, unspecified site 08/17/2012  . Other and unspecified hyperlipidemia 08/17/2012  . Loss of weight 01/19/2013  . Allergic state 06/22/2013  . Allergy   . Osteoporosis   . Syncope     fall  . Increased urinary frequency 01/02/2014  . Glaucoma 07/06/2014    Past Surgical History  Procedure Laterality Date  . Cholecystectomy    . Cesarean section    . Hernia repair    . Abdominal hysterectomy    . Ep implantable device N/A 09/26/2014    Procedure: Pacemaker Implant;  Surgeon: Evans Lance, MD;  Location: [facility];  Service: Cardiovascular;  Laterality: N/A;    Family History  Problem Relation Age of Onset  . Heart disease Mother   . Hypertension Mother   . Diabetes Mother   . Diabetes    . Cancer      lung- in distant releatives  . Cancer Brother     Social History   Social History  . Marital Status: Widowed    Spouse Name: N/A  . Number of Children: N/A  . Years of Education: N/A   Occupational History  . Not on file.   Social History Main Topics  . Smoking status: Former Smoker -- 0.10 packs/day for 60 years    Types: Cigarettes  . Smokeless tobacco: Never Used     Comment: patient has stopped smoking on  September 25, 2014  . Alcohol Use: No  . Drug Use: No  . Sexual Activity: No   Other Topics Concern  . Not on file   Social History Narrative    Outpatient Prescriptions Prior to Visit  Medication Sig Dispense Refill  . aspirin 81 MG chewable tablet Chew 1 tablet (81 mg total) by mouth daily. 30 tablet 11  . calcium-vitamin D (OSCAL WITH D) 500-200 MG-UNIT per tablet Take 1 tablet by mouth daily with breakfast.    . cetirizine (ZYRTEC) 10 MG tablet Take 10 mg by mouth every morning.    . diclofenac (FLECTOR) 1.3 % PTCH Place 1 patch onto the skin daily as needed (pain).    . fish oil-omega-3 fatty acids 1000 MG capsule Take 2 g by mouth daily at 12 noon.     . furosemide (LASIX) 20 MG tablet Take 1 tablet (20 mg total) by mouth daily as needed for edema. (Patient taking differently: Take 20 mg by mouth daily as needed for edema. ) 20 tablet 1  . Multiple Vitamin (MULTIVITAMIN) tablet Take 1 tablet by mouth daily at 12 noon.     . Saline (SODIUM CHLORIDE) 0.65 % SOLN Place 1 spray into the nose as needed for congestion. 2 sprays each nostril  once daily as needed    . Wheat Dextrin (BENEFIBER PO) Take 1 Applicatorful by mouth daily.    Marland Kitchen diltiazem (TIAZAC) 240 MG 24 hr capsule TAKE 1 CAPSULE (240 MG TOTAL) BY MOUTH DAILY. 30 capsule 6  . flecainide (TAMBOCOR) 50 MG tablet TAKE 1 TABLET BY MOUTH EVERY 12 HOURS 60 tablet 0  . losartan (COZAAR) 25 MG tablet Take 1 tablet (25 mg total) by mouth daily. 30 tablet 6  . metoprolol succinate (TOPROL-XL) 25 MG 24 hr tablet TAKE 1 TABLET (25 MG TOTAL) BY MOUTH DAILY. 90 tablet 1   No facility-administered medications prior to visit.    Allergies  Allergen Reactions  . Penicillins Shortness Of Breath, Diarrhea and Other (See Comments)    Stomach cramping  . Ciprofloxacin Other (See Comments)    myalgias  . Fexofenadine Other (See Comments)    unknown    Review of Systems  Constitutional: Negative for fever, chills and malaise/fatigue.  HENT:  Negative for congestion and hearing loss.   Eyes: Negative for discharge.  Respiratory: Negative for cough, sputum production and shortness of breath.   Cardiovascular: Positive for chest pain. Negative for palpitations and leg swelling.  Gastrointestinal: Negative for heartburn, nausea, vomiting, abdominal pain, diarrhea, constipation and blood in stool.  Genitourinary: Negative for dysuria, urgency, frequency and hematuria.  Musculoskeletal: Negative for myalgias, back pain and falls.  Skin: Negative for rash.  Neurological: Negative for dizziness, sensory change, loss of consciousness, weakness and headaches.  Endo/Heme/Allergies: Negative for environmental allergies. Does not bruise/bleed easily.  Psychiatric/Behavioral: Negative for depression and suicidal ideas. The patient is not nervous/anxious and does not have insomnia.        Objective:    Physical Exam  Constitutional: She is oriented to person, place, and time. She appears well-developed and well-nourished. No distress.  HENT:  Head: Normocephalic and atraumatic.  Nose: Nose normal.  Eyes: Right eye exhibits no discharge. Left eye exhibits no discharge.  Neck: Normal range of motion. Neck supple.  Cardiovascular: Normal rate and regular rhythm.   No murmur heard. Pulmonary/Chest: Effort normal and breath sounds normal.  Abdominal: Soft. Bowel sounds are normal. There is no tenderness.  Musculoskeletal: She exhibits no edema.  Neurological: She is alert and oriented to person, place, and time.  Skin: Skin is warm and dry.  Psychiatric: She has a normal mood and affect.  Nursing note and vitals reviewed.   BP 130/62 mmHg  Pulse 88  Temp(Src) 98.1 F (36.7 C) (Oral)  Ht '5\' 1"'  (1.549 m)  Wt 105 lb 8 oz (47.854 kg)  BMI 19.94 kg/m2  SpO2 96% Wt Readings from Last 3 Encounters:  12/04/14 105 lb 8 oz (47.854 kg)  11/18/14 108 lb 12.8 oz (49.351 kg)  11/03/14 106 lb 6 oz (48.251 kg)     Lab Results  Component  Value Date   WBC 5.0 11/18/2014   HGB 12.6 11/18/2014   HCT 37.2 11/18/2014   PLT 280 11/18/2014   GLUCOSE 95 12/04/2014   CHOL 195 08/28/2014   TRIG 56.0 08/28/2014   HDL 66.90 08/28/2014   LDLDIRECT 127.9 10/05/2011   LDLCALC 117* 08/28/2014   ALT 12 12/04/2014   AST 20 12/04/2014   NA 134* 12/04/2014   K 4.3 12/04/2014   CL 98 12/04/2014   CREATININE 0.67 12/04/2014   BUN 17 12/04/2014   CO2 28 12/04/2014   TSH 2.31 08/28/2014   INR 1.27 09/27/2014    Lab Results  Component Value Date  TSH 2.31 08/28/2014   Lab Results  Component Value Date   WBC 5.0 11/18/2014   HGB 12.6 11/18/2014   HCT 37.2 11/18/2014   MCV 89.6 11/18/2014   PLT 280 11/18/2014   Lab Results  Component Value Date   NA 134* 12/04/2014   K 4.3 12/04/2014   CO2 28 12/04/2014   GLUCOSE 95 12/04/2014   BUN 17 12/04/2014   CREATININE 0.67 12/04/2014   BILITOT 0.5 12/04/2014   ALKPHOS 75 12/04/2014   AST 20 12/04/2014   ALT 12 12/04/2014   PROT 8.0 12/04/2014   ALBUMIN 4.3 12/04/2014   CALCIUM 10.1 12/04/2014   ANIONGAP 6 11/18/2014   GFR 88.10 12/04/2014   Lab Results  Component Value Date   CHOL 195 08/28/2014   Lab Results  Component Value Date   HDL 66.90 08/28/2014   Lab Results  Component Value Date   LDLCALC 117* 08/28/2014   Lab Results  Component Value Date   TRIG 56.0 08/28/2014   Lab Results  Component Value Date   CHOLHDL 3 08/28/2014   No results found for: HGBA1C     Assessment & Plan:   Problem List Items Addressed This Visit    Hyperlipidemia    Encouraged heart healthy diet, increase exercise, avoid trans fats, consider a krill oil cap daily      Relevant Medications   metoprolol succinate (TOPROL-XL) 25 MG 24 hr tablet   flecainide (TAMBOCOR) 50 MG tablet   diltiazem (TIAZAC) 240 MG 24 hr capsule   losartan (COZAAR) 25 MG tablet   Essential hypertension - Primary    Well controlled, no changes to meds. Encouraged heart healthy diet such as the  DASH diet and exercise as tolerated.       Relevant Medications   metoprolol succinate (TOPROL-XL) 25 MG 24 hr tablet   flecainide (TAMBOCOR) 50 MG tablet   diltiazem (TIAZAC) 240 MG 24 hr capsule   losartan (COZAAR) 25 MG tablet   Other Relevant Orders   Comp Met (CMET) (Completed)   Ehrlichia Antibody Panel (Completed)   Lyme Aby, Western Blot IgG & IgM w/bands (Completed)   Rocky mtn spotted fvr ab, IgG-blood (Completed)   Chronic systolic heart failure    euvolemic today, no changes      Relevant Medications   metoprolol succinate (TOPROL-XL) 25 MG 24 hr tablet   flecainide (TAMBOCOR) 50 MG tablet   diltiazem (TIAZAC) 240 MG 24 hr capsule   losartan (COZAAR) 25 MG tablet   Chest pain    No recurrence since returning home.no acute complaints today. No clear cause found during hospitalization      Atrial fibrillation with RVR    Rate controlled on current meds.       Relevant Medications   metoprolol succinate (TOPROL-XL) 25 MG 24 hr tablet   flecainide (TAMBOCOR) 50 MG tablet   diltiazem (TIAZAC) 240 MG 24 hr capsule   losartan (COZAAR) 25 MG tablet    Other Visit Diagnoses    Tick bite        Relevant Orders    Ehrlichia Antibody Panel (Completed)    Lyme Aby, Western Blot IgG & IgM w/bands (Completed)    Rocky mtn spotted fvr ab, IgG-blood (Completed)    Rash and nonspecific skin eruption        Relevant Orders    Ehrlichia Antibody Panel (Completed)    Lyme Aby, Western Blot IgG & IgM w/bands (Completed)    Rocky mtn spotted fvr ab,  IgG-blood (Completed)    Tension-type headache, not intractable, unspecified chronicity pattern        Relevant Medications    metoprolol succinate (TOPROL-XL) 25 MG 24 hr tablet    diltiazem (TIAZAC) 240 MG 24 hr capsule    Other Relevant Orders    Ehrlichia Antibody Panel (Completed)    Lyme Aby, Western Blot IgG & IgM w/bands (Completed)    Rocky mtn spotted fvr ab, IgG-blood (Completed)       I have changed Ms.  Adelson's flecainide and losartan. I am also having her maintain her sodium chloride, multivitamin, fish oil-omega-3 fatty acids, cetirizine, aspirin, furosemide, diclofenac, calcium-vitamin D, Wheat Dextrin (BENEFIBER PO), metoprolol succinate, and diltiazem.  Meds ordered this encounter  Medications  . metoprolol succinate (TOPROL-XL) 25 MG 24 hr tablet    Sig: TAKE 1 TABLET (25 MG TOTAL) BY MOUTH DAILY.    Dispense:  90 tablet    Refill:  1  . flecainide (TAMBOCOR) 50 MG tablet    Sig: Take 1 tablet (50 mg total) by mouth every 12 (twelve) hours.    Dispense:  60 tablet    Refill:  5  . diltiazem (TIAZAC) 240 MG 24 hr capsule    Sig: TAKE 1 CAPSULE (240 MG TOTAL) BY MOUTH DAILY.    Dispense:  30 capsule    Refill:  5    D/C PREVIOUS SCRIPTS FOR THIS MEDICATION  . losartan (COZAAR) 25 MG tablet    Sig: Take 1 tablet (25 mg total) by mouth at bedtime.    Dispense:  30 tablet    Refill:  6     Penni Homans, MD

## 2014-12-20 NOTE — Assessment & Plan Note (Signed)
Encouraged heart healthy diet, increase exercise, avoid trans fats, consider a krill oil cap daily 

## 2014-12-20 NOTE — Assessment & Plan Note (Signed)
euvolemic today, no changes

## 2014-12-20 NOTE — Assessment & Plan Note (Signed)
Rate controlled on current meds 

## 2014-12-20 NOTE — Assessment & Plan Note (Addendum)
No recurrence since returning home.no acute complaints today. No clear cause found during hospitalization

## 2014-12-23 ENCOUNTER — Encounter: Payer: Self-pay | Admitting: Internal Medicine

## 2014-12-23 ENCOUNTER — Ambulatory Visit (INDEPENDENT_AMBULATORY_CARE_PROVIDER_SITE_OTHER): Payer: Medicare Other | Admitting: Internal Medicine

## 2014-12-23 VITALS — BP 142/64 | HR 74 | Ht 61.0 in | Wt 106.0 lb

## 2014-12-23 DIAGNOSIS — I4891 Unspecified atrial fibrillation: Secondary | ICD-10-CM

## 2014-12-23 DIAGNOSIS — Z950 Presence of cardiac pacemaker: Secondary | ICD-10-CM | POA: Diagnosis not present

## 2014-12-23 DIAGNOSIS — I442 Atrioventricular block, complete: Secondary | ICD-10-CM

## 2014-12-23 DIAGNOSIS — I5022 Chronic systolic (congestive) heart failure: Secondary | ICD-10-CM | POA: Diagnosis not present

## 2014-12-23 DIAGNOSIS — I495 Sick sinus syndrome: Secondary | ICD-10-CM

## 2014-12-23 LAB — CUP PACEART INCLINIC DEVICE CHECK
Brady Statistic AP VS Percent: 75 %
Brady Statistic AS VS Percent: 25 %
Date Time Interrogation Session: 20160927153139
Lead Channel Impedance Value: 544 Ohm
Lead Channel Pacing Threshold Amplitude: 0.75 V
Lead Channel Sensing Intrinsic Amplitude: 8 mV
Lead Channel Setting Pacing Pulse Width: 0.4 ms
MDC IDC MSMT BATTERY IMPEDANCE: 100 Ohm
MDC IDC MSMT BATTERY REMAINING LONGEVITY: 154 mo
MDC IDC MSMT BATTERY VOLTAGE: 2.79 V
MDC IDC MSMT LEADCHNL RA IMPEDANCE VALUE: 524 Ohm
MDC IDC MSMT LEADCHNL RA PACING THRESHOLD AMPLITUDE: 0.75 V
MDC IDC MSMT LEADCHNL RA PACING THRESHOLD PULSEWIDTH: 0.4 ms
MDC IDC MSMT LEADCHNL RA SENSING INTR AMPL: 2 mV
MDC IDC MSMT LEADCHNL RV PACING THRESHOLD PULSEWIDTH: 0.4 ms
MDC IDC SET LEADCHNL RA PACING AMPLITUDE: 1.5 V
MDC IDC SET LEADCHNL RV PACING AMPLITUDE: 2.5 V
MDC IDC SET LEADCHNL RV SENSING SENSITIVITY: 4 mV
MDC IDC STAT BRADY AP VP PERCENT: 0 %
MDC IDC STAT BRADY AS VP PERCENT: 0 %

## 2014-12-23 NOTE — Assessment & Plan Note (Signed)
Her medtronic DDD PM is working normally. Will recheck in several months. 

## 2014-12-23 NOTE — Assessment & Plan Note (Signed)
Her symptoms are class 2. No change in meds. SHe is encouraged to maintain a low sodium diet.

## 2014-12-23 NOTE — Patient Instructions (Signed)
Medication Instructions:  Your physician recommends that you continue on your current medications as directed. Please refer to the Current Medication list given to you today.   Labwork: None ordered  Testing/Procedures: None ordered  Follow-Up: Your physician wants you to follow-up in: June with Dr Knox Saliva will receive a reminder letter in the mail two months in advance. If you don't receive a letter, please call our office to schedule the follow-up appointment.  Remote monitoring is used to monitor your Pacemaker  from home. This monitoring reduces the number of office visits required to check your device to one time per year. It allows [place] to keep an eye on the functioning of your device to ensure it is working properly. You are scheduled for a device check from home on 03/24/15. You may send your transmission at any time that day. If you have a wireless device, the transmission will be sent automatically. After your physician reviews your transmission, you will receive a postcard with your next transmission date.      Any Other Special Instructions Will Be Listed Below (If Applicable).

## 2014-12-23 NOTE — Progress Notes (Signed)
HPI Beth [name] returns today for followup. She is a pleasant elderly woman with symptomatic bradycardia, s/p PPM insertion. She c/o tingling over her PM insertion site. She was placed on losartan for her HTN but could not tolerate the medication. No syncope. She is fairly sedentary.  Allergies  Allergen Reactions  . Penicillins Shortness Of Breath, Diarrhea and Other (See Comments)    Stomach cramping  . Ciprofloxacin Other (See Comments)    myalgias  . Fexofenadine Other (See Comments)    unknown     Current Outpatient Prescriptions  Medication Sig Dispense Refill  . aspirin 81 MG chewable tablet Chew 1 tablet (81 mg total) by mouth daily. 30 tablet 11  . calcium-vitamin D (OSCAL WITH D) 500-200 MG-UNIT per tablet Take 1 tablet by mouth daily with breakfast.    . cetirizine (ZYRTEC) 10 MG tablet Take 10 mg by mouth every morning.    . diclofenac (FLECTOR) 1.3 % PTCH Place 1 patch onto the skin daily as needed (pain).    Marland Kitchen diltiazem (TIAZAC) 240 MG 24 hr capsule TAKE 1 CAPSULE (240 MG TOTAL) BY MOUTH DAILY. 30 capsule 5  . fish oil-omega-3 fatty acids 1000 MG capsule Take 2 g by mouth daily at 12 noon.     . flecainide (TAMBOCOR) 50 MG tablet Take 1 tablet (50 mg total) by mouth every 12 (twelve) hours. 60 tablet 5  . furosemide (LASIX) 20 MG tablet Take 1 tablet (20 mg total) by mouth daily as needed for edema. (Patient taking differently: Take 20 mg by mouth daily as needed for edema. ) 20 tablet 1  . losartan (COZAAR) 25 MG tablet Take 1 tablet (25 mg total) by mouth at bedtime. 30 tablet 6  . metoprolol succinate (TOPROL-XL) 25 MG 24 hr tablet TAKE 1 TABLET (25 MG TOTAL) BY MOUTH DAILY. 90 tablet 1  . Multiple Vitamin (MULTIVITAMIN) tablet Take 1 tablet by mouth daily at 12 noon.     . Saline (SODIUM CHLORIDE) 0.65 % SOLN Place 1 spray into the nose as needed for congestion. 2 sprays each nostril once daily as needed    . Wheat Dextrin (BENEFIBER PO) Take 1 Applicatorful  by mouth daily.     No current facility-administered medications for this visit.     Past Medical History  Diagnosis Date  . Hypertension   . Lung nodule     CT of chest 2007-left loewr lobar mass- evaluated by pulmonary- patient refused further work up  . Tobacco abuse   . Vertigo   . Abdominal pain, unspecified site 08/17/2012  . Other and unspecified hyperlipidemia 08/17/2012  . Loss of weight 01/19/2013  . Allergic state 06/22/2013  . Allergy   . Osteoporosis   . Syncope     fall  . Increased urinary frequency 01/02/2014  . Glaucoma 07/06/2014    ROS:   All systems reviewed and negative except as noted in the HPI.   Past Surgical History  Procedure Laterality Date  . Cholecystectomy    . Cesarean section    . Hernia repair    . Abdominal hysterectomy    . Ep implantable device N/A 09/26/2014    Procedure: Pacemaker Implant;  Surgeon: Evans Lance, MD;  Location: [place];  Service: Cardiovascular;  Laterality: N/A;     Family History  Problem Relation Age of Onset  . Heart disease Mother   . Hypertension Mother   . Diabetes Mother   . Diabetes    .  Cancer      lung- in distant releatives  . Cancer Brother      Social History   Social History  . Marital Status: Widowed    Spouse Name: N/A  . Number of Children: N/A  . Years of Education: N/A   Occupational History  . Not on file.   Social History Main Topics  . Smoking status: Former Smoker -- 0.10 packs/day for 60 years    Types: Cigarettes  . Smokeless tobacco: Never Used     Comment: patient has stopped smoking on September 25, 2014  . Alcohol Use: No  . Drug Use: No  . Sexual Activity: No   Other Topics Concern  . Not on file   Social History Narrative     BP 142/64 mmHg  Pulse 74  Ht 5\' 1"  (1.549 m)  Wt 106 lb (48.081 kg)  BMI 20.04 kg/m2  Physical Exam:  Well appearing elderly woman, NAD HEENT: Unremarkable Neck:  No JVD, no thyromegally Lymphatics:  No  adenopathy Back:  No CVA tenderness Lungs:  Clear with no wheezes HEART:  Regular rate rhythm, no murmurs, no rubs, no clicks Abd:  soft, positive bowel sounds, no organomegally, no rebound, no guarding Ext:  2 plus pulses, no edema, no cyanosis, no clubbing Skin:  No rashes no nodules Neuro:  CN II through XII intact, motor grossly intact  EKG - nsr with ventricular pacing  DEVICE  Normal device function.  See PaceArt for details.   Assess/Plan:

## 2014-12-23 NOTE — Assessment & Plan Note (Signed)
She is maintaining NSR on low dose flecainide. She will continue her current meds.

## 2015-01-05 ENCOUNTER — Encounter: Payer: Self-pay | Admitting: Family

## 2015-01-05 ENCOUNTER — Encounter (HOSPITAL_BASED_OUTPATIENT_CLINIC_OR_DEPARTMENT_OTHER): Payer: Self-pay | Admitting: *Deleted

## 2015-01-05 ENCOUNTER — Ambulatory Visit (INDEPENDENT_AMBULATORY_CARE_PROVIDER_SITE_OTHER): Payer: Medicare Other | Admitting: Family

## 2015-01-05 ENCOUNTER — Emergency Department (HOSPITAL_BASED_OUTPATIENT_CLINIC_OR_DEPARTMENT_OTHER): Payer: Medicare Other

## 2015-01-05 ENCOUNTER — Inpatient Hospital Stay (HOSPITAL_BASED_OUTPATIENT_CLINIC_OR_DEPARTMENT_OTHER)
Admission: EM | Admit: 2015-01-05 | Discharge: 2015-01-09 | DRG: 292 | Disposition: A | Discharge status: Home / Self Care | Payer: Medicare Other | Attending: Internal Medicine | Admitting: Internal Medicine

## 2015-01-05 ENCOUNTER — Telehealth: Payer: Self-pay | Admitting: Internal Medicine

## 2015-01-05 VITALS — BP 138/44 | HR 71 | Temp 97.6°F | Resp 24 | Ht 61.0 in | Wt 111.8 lb

## 2015-01-05 DIAGNOSIS — E871 Hypo-osmolality and hyponatremia: Secondary | ICD-10-CM | POA: Diagnosis not present

## 2015-01-05 DIAGNOSIS — I10 Essential (primary) hypertension: Secondary | ICD-10-CM | POA: Diagnosis present

## 2015-01-05 DIAGNOSIS — J90 Pleural effusion, not elsewhere classified: Secondary | ICD-10-CM | POA: Diagnosis not present

## 2015-01-05 DIAGNOSIS — N3001 Acute cystitis with hematuria: Secondary | ICD-10-CM | POA: Diagnosis not present

## 2015-01-05 DIAGNOSIS — I5043 Acute on chronic combined systolic (congestive) and diastolic (congestive) heart failure: Secondary | ICD-10-CM | POA: Diagnosis present

## 2015-01-05 DIAGNOSIS — R0902 Hypoxemia: Secondary | ICD-10-CM | POA: Diagnosis present

## 2015-01-05 DIAGNOSIS — R911 Solitary pulmonary nodule: Secondary | ICD-10-CM | POA: Diagnosis not present

## 2015-01-05 DIAGNOSIS — Z950 Presence of cardiac pacemaker: Secondary | ICD-10-CM

## 2015-01-05 DIAGNOSIS — Z7982 Long term (current) use of aspirin: Secondary | ICD-10-CM | POA: Diagnosis not present

## 2015-01-05 DIAGNOSIS — Z87891 Personal history of nicotine dependence: Secondary | ICD-10-CM

## 2015-01-05 DIAGNOSIS — E785 Hyperlipidemia, unspecified: Secondary | ICD-10-CM | POA: Diagnosis present

## 2015-01-05 DIAGNOSIS — R5383 Other fatigue: Secondary | ICD-10-CM | POA: Diagnosis not present

## 2015-01-05 DIAGNOSIS — I5023 Acute on chronic systolic (congestive) heart failure: Secondary | ICD-10-CM | POA: Diagnosis not present

## 2015-01-05 DIAGNOSIS — I4891 Unspecified atrial fibrillation: Secondary | ICD-10-CM | POA: Diagnosis not present

## 2015-01-05 DIAGNOSIS — E876 Hypokalemia: Secondary | ICD-10-CM | POA: Diagnosis not present

## 2015-01-05 DIAGNOSIS — I480 Paroxysmal atrial fibrillation: Secondary | ICD-10-CM | POA: Diagnosis present

## 2015-01-05 DIAGNOSIS — R05 Cough: Secondary | ICD-10-CM | POA: Diagnosis not present

## 2015-01-05 DIAGNOSIS — R0602 Shortness of breath: Secondary | ICD-10-CM

## 2015-01-05 DIAGNOSIS — I110 Hypertensive heart disease with heart failure: Secondary | ICD-10-CM | POA: Diagnosis present

## 2015-01-05 DIAGNOSIS — R0600 Dyspnea, unspecified: Secondary | ICD-10-CM | POA: Diagnosis not present

## 2015-01-05 DIAGNOSIS — I509 Heart failure, unspecified: Secondary | ICD-10-CM | POA: Diagnosis not present

## 2015-01-05 DIAGNOSIS — J449 Chronic obstructive pulmonary disease, unspecified: Secondary | ICD-10-CM | POA: Diagnosis not present

## 2015-01-05 DIAGNOSIS — J948 Other specified pleural conditions: Secondary | ICD-10-CM | POA: Diagnosis not present

## 2015-01-05 DIAGNOSIS — R918 Other nonspecific abnormal finding of lung field: Secondary | ICD-10-CM | POA: Diagnosis present

## 2015-01-05 HISTORY — DX: Unspecified osteoarthritis, unspecified site: M19.90

## 2015-01-05 HISTORY — DX: Unspecified atrial fibrillation: I48.91

## 2015-01-05 HISTORY — DX: Unspecified systolic (congestive) heart failure: I50.20

## 2015-01-05 HISTORY — DX: Chronic obstructive pulmonary disease, unspecified: J44.9

## 2015-01-05 LAB — POCT URINALYSIS DIPSTICK
BILIRUBIN UA: NEGATIVE
Glucose, UA: NEGATIVE
KETONES UA: NEGATIVE
NITRITE UA: NEGATIVE
Spec Grav, UA: >=1.03
Urobilinogen, UA: NEGATIVE
pH, UA: 6

## 2015-01-05 LAB — CBC WITH DIFFERENTIAL/PLATELET
BASOS ABS: 0 10*3/uL (ref 0.0–0.1)
Basophils Relative: 0 %
Eosinophils Absolute: 0.2 10*3/uL (ref 0.0–0.7)
Eosinophils Relative: 2 %
HEMATOCRIT: 37.3 % (ref 36.0–46.0)
HEMOGLOBIN: 12.3 g/dL (ref 12.0–15.0)
LYMPHS PCT: 9 %
Lymphs Abs: 0.9 10*3/uL (ref 0.7–4.0)
MCH: 29.4 pg (ref 26.0–34.0)
MCHC: 33 g/dL (ref 30.0–36.0)
MCV: 89.2 fL (ref 78.0–100.0)
MONOS PCT: 7 %
Monocytes Absolute: 0.7 10*3/uL (ref 0.1–1.0)
NEUTROS ABS: 7.7 10*3/uL (ref 1.7–7.7)
NEUTROS PCT: 82 %
Platelets: 345 10*3/uL (ref 150–400)
RBC: 4.18 MIL/uL (ref 3.87–5.11)
RDW: 12.6 % (ref 11.5–15.5)
WBC: 9.5 10*3/uL (ref 4.0–10.5)

## 2015-01-05 LAB — COMPREHENSIVE METABOLIC PANEL
ALBUMIN: 3.6 g/dL (ref 3.5–5.0)
ALK PHOS: 67 U/L (ref 38–126)
ALT: 13 U/L — AB (ref 14–54)
AST: 25 U/L (ref 15–41)
Anion gap: 6 (ref 5–15)
BUN: 15 mg/dL (ref 6–20)
CALCIUM: 9.5 mg/dL (ref 8.9–10.3)
CHLORIDE: 101 mmol/L (ref 101–111)
CO2: 28 mmol/L (ref 22–32)
CREATININE: 0.83 mg/dL (ref 0.44–1.00)
GFR calc non Af Amer: >60 mL/min (ref >60)
GLUCOSE: 135 mg/dL — AB (ref 65–99)
Potassium: 4.7 mmol/L (ref 3.5–5.1)
SODIUM: 135 mmol/L (ref 135–145)
Total Bilirubin: 0.7 mg/dL (ref 0.3–1.2)
Total Protein: 8.1 g/dL (ref 6.5–8.1)

## 2015-01-05 LAB — URINALYSIS, ROUTINE W REFLEX MICROSCOPIC
Bilirubin Urine: NEGATIVE
GLUCOSE, UA: NEGATIVE mg/dL
Ketones, ur: NEGATIVE mg/dL
LEUKOCYTES UA: NEGATIVE
Nitrite: NEGATIVE
PROTEIN: NEGATIVE mg/dL
SPECIFIC GRAVITY, URINE: 1.004 — AB (ref 1.005–1.030)
UROBILINOGEN UA: 0.2 mg/dL (ref 0.0–1.0)
pH: 6 (ref 5.0–8.0)

## 2015-01-05 LAB — URINE MICROSCOPIC-ADD ON

## 2015-01-05 LAB — BRAIN NATRIURETIC PEPTIDE: B NATRIURETIC PEPTIDE 5: 185.8 pg/mL — AB (ref 0.0–100.0)

## 2015-01-05 LAB — TROPONIN I: Troponin I: <0.03 ng/mL (ref <0.031)

## 2015-01-05 LAB — POCT HEMOGLOBIN: Hemoglobin: 12.7 g/dL (ref 12.2–16.2)

## 2015-01-05 MED ORDER — METHYLPREDNISOLONE SODIUM SUCC 125 MG IJ SOLR
125.0000 mg | Freq: Once | INTRAMUSCULAR | Status: AC
  Administered 2015-01-05: 125 mg via INTRAVENOUS
  Filled 2015-01-05: qty 2

## 2015-01-05 MED ORDER — FUROSEMIDE 10 MG/ML IJ SOLN
20.0000 mg | Freq: Once | INTRAMUSCULAR | Status: AC
  Administered 2015-01-05: 20 mg via INTRAVENOUS
  Filled 2015-01-05: qty 2

## 2015-01-05 MED ORDER — IPRATROPIUM-ALBUTEROL 0.5-2.5 (3) MG/3ML IN SOLN
3.0000 mL | Freq: Four times a day (QID) | RESPIRATORY_TRACT | Status: DC
  Administered 2015-01-05: 3 mL via RESPIRATORY_TRACT
  Filled 2015-01-05 (×2): qty 3

## 2015-01-05 NOTE — Telephone Encounter (Signed)
New Message       Pt's daughter calling stating that ever since pt's last appt she has lost all energy and is very tired. She wants to know if anything was changed or adjusted on pt's device. Please call back and advise.

## 2015-01-05 NOTE — Progress Notes (Signed)
Subjective:    Patient ID: Beth [name], female    DOB: 1924/01/18, 81 y.o.   MRN: [id number]  HPI  [name] is an 81 yr old female who presents today to discuss Fatigue.  Reports fatigue has been present x 2 weeks. Reports worsening SOB x 2 weeks. Not taking losartan.  Reports intermittent "muscle spasm" in her abdomen.  Reports mild nausea. She reports some right sided chest discomfort, reports that it feels hard to take a deep breath.  Reports BM this AM. Reports some recent urinary incontinence which is unusual for her.    Daughter reports that the patient lives alone and she went to get her today at her house and she had not been able to do any of her dishes due to weakness. This is unlike her.     Wt Readings from Last 3 Encounters:  01/05/15 111 lb 12.8 oz (50.712 kg)  12/23/14 106 lb (48.081 kg)  12/04/14 105 lb 8 oz (47.854 kg)    Review of Systems See HPI  Past Medical History  Diagnosis Date  . Hypertension   . Lung nodule     CT of chest 2007-left loewr lobar mass- evaluated by pulmonary- patient refused further work up  . Tobacco abuse   . Vertigo   . Abdominal pain, unspecified site 08/17/2012  . Other and unspecified hyperlipidemia 08/17/2012  . Loss of weight 01/19/2013  . Allergic state 06/22/2013  . Allergy   . Osteoporosis   . Syncope     fall  . Increased urinary frequency 01/02/2014  . Glaucoma 07/06/2014    Social History   Social History  . Marital Status: Widowed    Spouse Name: N/A  . Number of Children: N/A  . Years of Education: N/A   Occupational History  . Not on file.   Social History Main Topics  . Smoking status: Former Smoker -- 0.10 packs/day for 60 years    Types: Cigarettes  . Smokeless tobacco: Never Used     Comment: patient has stopped smoking on September 25, 2014  . Alcohol Use: No  . Drug Use: No  . Sexual Activity: No   Other Topics Concern  . Not on file   Social History Narrative    Past Surgical History    Procedure Laterality Date  . Cholecystectomy    . Cesarean section    . Hernia repair    . Abdominal hysterectomy    . Ep implantable device N/A 09/26/2014    Procedure: Pacemaker Implant;  Surgeon: Evans Lance, MD;  Location: [facility];  Service: Cardiovascular;  Laterality: N/A;    Family History  Problem Relation Age of Onset  . Heart disease Mother   . Hypertension Mother   . Diabetes Mother   . Diabetes    . Cancer      lung- in distant releatives  . Cancer Brother     Allergies  Allergen Reactions  . Penicillins Shortness Of Breath, Diarrhea and Other (See Comments)    Stomach cramping  . Ciprofloxacin Other (See Comments)    myalgias  . Fexofenadine Other (See Comments)    unknown    Current Outpatient Prescriptions on File Prior to Visit  Medication Sig Dispense Refill  . aspirin 81 MG chewable tablet Chew 1 tablet (81 mg total) by mouth daily. 30 tablet 11  . calcium-vitamin D (OSCAL WITH D) 500-200 MG-UNIT per tablet Take 1 tablet by mouth daily with breakfast.    .  cetirizine (ZYRTEC) 10 MG tablet Take 10 mg by mouth every morning.    . diclofenac (FLECTOR) 1.3 % PTCH Place 1 patch onto the skin daily as needed (pain).    Marland Kitchen diltiazem (TIAZAC) 240 MG 24 hr capsule TAKE 1 CAPSULE (240 MG TOTAL) BY MOUTH DAILY. 30 capsule 5  . fish oil-omega-3 fatty acids 1000 MG capsule Take 2 g by mouth daily at 12 noon.     . flecainide (TAMBOCOR) 50 MG tablet Take 1 tablet (50 mg total) by mouth every 12 (twelve) hours. 60 tablet 5  . furosemide (LASIX) 20 MG tablet Take 1 tablet (20 mg total) by mouth daily as needed for edema. (Patient taking differently: Take 20 mg by mouth daily as needed for edema. ) 20 tablet 1  . metoprolol succinate (TOPROL-XL) 25 MG 24 hr tablet TAKE 1 TABLET (25 MG TOTAL) BY MOUTH DAILY. 90 tablet 1  . Multiple Vitamin (MULTIVITAMIN) tablet Take 1 tablet by mouth daily at 12 noon.     . Saline (SODIUM CHLORIDE) 0.65 % SOLN Place 1 spray into  the nose as needed for congestion. 2 sprays each nostril once daily as needed    . Wheat Dextrin (BENEFIBER PO) Take 1 Applicatorful by mouth daily.    Marland Kitchen losartan (COZAAR) 25 MG tablet Take 1 tablet (25 mg total) by mouth at bedtime. (Patient not taking: Reported on 01/05/2015) 30 tablet 6   No current facility-administered medications on file prior to visit.    BP 138/44 mmHg  Pulse 71  Temp(Src) 97.6 F (36.4 C) (Oral)  Resp 24  Ht 5\' 1"  (1.549 m)  Wt 111 lb 12.8 oz (50.712 kg)  BMI 21.14 kg/m2  SpO2 95%       Objective:   Physical Exam  Constitutional: She is oriented to person, place, and time.  Pale, weak appearing elderly white female  HENT:  Head: Normocephalic and atraumatic.  Cardiovascular: Normal rate and regular rhythm.   Pulmonary/Chest: No respiratory distress. She has no wheezes. She has no rales.  RML crackles, bibasilar crackles, mild increased WOB noted after mild exertion  Abdominal: Soft. There is no tenderness.  Mild abdominal distension  Musculoskeletal: She exhibits no edema.  Neurological: She is alert and oriented to person, place, and time.  Hard of hearing  Skin: Skin is warm and dry.  Psychiatric: She has a normal mood and affect. Her behavior is normal. Judgment and thought content normal.          Assessment & Plan:  Shortness of breath- ? CHF exacerbation (+ 5 pound weight gain) versus pneumonia.  Hemocue- notes HgB 12.7 UTI- UA notes + Leuks, + blood, + protein. Suspect that the patient is also dehydrated.    Patient cannot safely return home tonight for outpatient work up. I have advised patient and daughter to proceed to the ED for further evaluation.  She is agreeable to proceed.    Report given to Dr. Lonia Skinner [facility]  Physician in charge.

## 2015-01-05 NOTE — ED Provider Notes (Signed)
CSN: [id number]   Arrival date & time 01/05/15 1713  History  By signing my name below, I, Beth [name], attest that this documentation has been prepared under the direction and in the presence of Beth Quale, MD. Electronically Signed: Altamease [name], ED Scribe. 01/05/2015. 2:29 AM.  Chief Complaint  Patient presents with  . Shortness of Breath    HPI The history is provided by the patient. No language interpreter was used.   Beth [name] is a 81 y.o. female with history of HTN, HLD, heart failure s/p pacemaker placement, and a lung nodule who presents to the Emergency Department complaining of new and constant SOB with onset 2 weeks ago. The SOB is exacerbated by laying flat. She is not on oxygen at home. Associated symptoms include productive cough, pain that starts at the right posterior shoulder and goes to the right side of the chest for 1 week, abdominal pain with breathing, and LE swelling. No fever. Pt was seen by her PCP today and referred to the ED. She is a former smoker but denies any nebulizer or inhaler use in the past.  Past Medical History  Diagnosis Date  . Hypertension   . Lung nodule     CT of chest 2007-left loewr lobar mass- evaluated by pulmonary- patient refused further work up  . Tobacco abuse   . Vertigo   . Abdominal pain, unspecified site 08/17/2012  . Other and unspecified hyperlipidemia 08/17/2012  . Loss of weight 01/19/2013  . Allergic state 06/22/2013  . Allergy   . Osteoporosis   . Syncope     fall  . Increased urinary frequency 01/02/2014  . Glaucoma 07/06/2014  . COPD (chronic obstructive pulmonary disease) ([place])   . Atrial fibrillation with rapid ventricular response [facility])     Past Surgical History  Procedure Laterality Date  . Cholecystectomy    . Cesarean section    . Hernia repair    . Abdominal hysterectomy    . Ep implantable device N/A 09/26/2014    Procedure: Pacemaker Implant;  Surgeon: Evans Lance, MD;  Location: [place] [facility];  Service: Cardiovascular;  Laterality: N/A;    Family History  Problem Relation Age of Onset  . Heart disease Mother   . Hypertension Mother   . Diabetes Mother   . Diabetes    . Cancer      lung- in distant releatives  . Cancer Brother     Social History  Substance Use Topics  . Smoking status: Former Smoker -- 0.10 packs/day for 60 years    Types: Cigarettes  . Smokeless tobacco: Never Used     Comment: patient has stopped smoking on September 25, 2014  . Alcohol Use: No     Review of Systems  Constitutional: Negative for fever, chills, diaphoresis and appetite change.  HENT: Negative for congestion, postnasal drip and rhinorrhea.   Eyes: Negative for pain and redness.  Respiratory: Positive for cough and shortness of breath.   Cardiovascular: Positive for chest pain and leg swelling. Negative for palpitations.  Gastrointestinal: Negative for nausea, vomiting, abdominal pain and diarrhea.  Genitourinary: Negative for dysuria, urgency and frequency.  Musculoskeletal: Negative for myalgias and back pain.  Skin: Negative for rash.  Neurological: Negative for dizziness, weakness and light-headedness.  All other systems reviewed and are negative.  Home Medications   Prior to Admission medications   Medication Sig Start Date End Date Taking? Authorizing Provider  aspirin 81 MG chewable tablet Chew  1 tablet (81 mg total) by mouth daily. 07/02/13  Yes Orson Eva, MD  calcium-vitamin D (OSCAL WITH D) 500-200 MG-UNIT per tablet Take 1 tablet by mouth daily with breakfast.   Yes Historical Provider, MD  cetirizine (ZYRTEC) 10 MG tablet Take 10 mg by mouth every morning.   Yes Historical Provider, MD  diclofenac (FLECTOR) 1.3 % PTCH Place 1 patch onto the skin daily as needed (pain).   Yes Historical Provider, MD  fish oil-omega-3 fatty acids 1000 MG capsule Take 2 g by mouth daily at 12 noon.    Yes Historical Provider, MD  flecainide (TAMBOCOR) 50 MG tablet Take 1 tablet  (50 mg total) by mouth every 12 (twelve) hours. 12/04/14  Yes Mosie Lukes, MD  Multiple Vitamin (MULTIVITAMIN) tablet Take 1 tablet by mouth daily at 12 noon.    Yes Historical Provider, MD  Saline (SODIUM CHLORIDE) 0.65 % SOLN Place 1 spray into the nose as needed for congestion. 2 sprays each nostril once daily as needed   Yes Historical Provider, MD  Wheat Dextrin (BENEFIBER PO) Take 1 Applicatorful by mouth daily.   Yes Historical Provider, MD  diltiazem (TIAZAC) 240 MG 24 hr capsule TAKE 1 CAPSULE (240 MG TOTAL) BY MOUTH DAILY. 12/04/14   Mosie Lukes, MD  furosemide (LASIX) 20 MG tablet Take 1 tablet (20 mg total) by mouth daily as needed for edema. Patient taking differently: Take 20 mg by mouth daily as needed for edema.  11/03/14   Mosie Lukes, MD  losartan (COZAAR) 25 MG tablet Take 1 tablet (25 mg total) by mouth at bedtime. Patient not taking: Reported on 01/05/2015 12/04/14   Mosie Lukes, MD  metoprolol succinate (TOPROL-XL) 25 MG 24 hr tablet TAKE 1 TABLET (25 MG TOTAL) BY MOUTH DAILY. 12/04/14   Mosie Lukes, MD    Allergies  Penicillins; Ciprofloxacin; and Fexofenadine  Triage Vitals: BP 157/51 mmHg  Pulse 61  Temp(Src) 97.6 F (36.4 C) (Oral)  Resp 20  Ht 5\' 1"  (1.549 m)  Wt 111 lb (50.349 kg)  BMI 20.98 kg/m2  SpO2 93%  Physical Exam  Constitutional: She is oriented to person, place, and time. She appears well-developed and well-nourished. No distress.  HENT:  Head: Normocephalic and atraumatic.  Eyes: EOM are normal.  Neck: Normal range of motion.  Cardiovascular: Normal rate and regular rhythm.   Murmur heard. Pulmonary/Chest: Effort normal. She has rales (bilateral, greater on right).  Abdominal: Soft. She exhibits no distension. There is no tenderness.  Musculoskeletal: Normal range of motion. She exhibits edema (1+, symmetrical, pitting edema of bilateral lower extremities).  Neurological: She is alert and oriented to person, place, and time.  Skin: Skin  is warm and dry.  Psychiatric: She has a normal mood and affect. Judgment normal.  Nursing note and vitals reviewed.   ED Course  Procedures   DIAGNOSTIC STUDIES: Oxygen Saturation is 93% on 2 L, adequate by my interpretation.    COORDINATION OF CARE: 5:39 PM Discussed treatment plan which includes CXR with pt at bedside and pt agreed to plan.  8:24 PM-Consult complete with Dr. Loleta Books (Hospitalist). Patient case explained and discussed. Agrees to admit patient for further evaluation and treatment. Call ended at 8:27 PM.  Labs Reviewed  COMPREHENSIVE METABOLIC PANEL - Abnormal; Notable for the following:    Glucose, Bld 135 (*)    ALT 13 (*)    All other components within normal limits  URINALYSIS, ROUTINE W REFLEX MICROSCOPIC (NOT  AT [facility] - Abnormal; Notable for the following:    Specific Gravity, Urine 1.004 (*)    Hgb urine dipstick SMALL (*)    All other components within normal limits  BRAIN NATRIURETIC PEPTIDE - Abnormal; Notable for the following:    B Natriuretic Peptide 185.8 (*)    All other components within normal limits  CBC - Abnormal; Notable for the following:    RBC 3.74 (*)    Hemoglobin 11.4 (*)    HCT 33.1 (*)    All other components within normal limits  CBC WITH DIFFERENTIAL/PLATELET  TROPONIN I  URINE MICROSCOPIC-ADD ON  TROPONIN I  TROPONIN I  TROPONIN I  BASIC METABOLIC PANEL  CBC  CBC WITH DIFFERENTIAL/PLATELET  MAGNESIUM  TSH  CREATININE, SERUM    Imaging Review Dg Chest 2 View  01/05/2015   CLINICAL DATA:  Subacute shortness of breath, cough and weakness for 10 days.  EXAM: CHEST  2 VIEW  COMPARISON:  11/17/2014 and prior radiograph  FINDINGS: Mild cardiomegaly and left-sided pacemaker again noted.  A moderate right pleural effusion has increased in size from the prior study, previously minimal. Associated right lower lung atelectasis identified.  Left pulmonary nodules and COPD/emphysema again noted.  There is no evidence of pneumothorax  or acute bony abnormality. A 50% anterior wedge compression fracture of a lower thoracic vertebra is unchanged.  IMPRESSION: Moderate right pleural effusion with right lower lung atelectasis. This acute has significantly increased in size since the prior study.  COPD/ emphysema, cardiomegaly and left pulmonary nodules again noted.   Electronically Signed   By: Margarette Canada M.D.   On: 01/05/2015 18:53    I personally reviewed and evaluated these images and lab results as a part of my medical decision-making.   EKG Interpretation  Date/Time:  Monday January 05 2015 18:22:35 EDT Ventricular Rate:  70 PR Interval:  160 QRS Duration: 136 QT Interval:  454 QTC Calculation: 490 R Axis:   -81 Text Interpretation:   Suspect unspecified pacemaker failure Normal sinus rhythm Left axis deviation Non-specific intra-ventricular conduction block Cannot rule out Anteroseptal infarct , age undetermined T wave abnormality, consider lateral ischemia Abnormal ECG No significant change since last tracing Confirmed by NGUYEN, EMILY ([id number]) on 01/05/2015 7:33:21 PM    MDM  Patient was seen and evaluated in stable condition.  Patient became hypoxic with mild exertion.  Physical examination and history most consistent with CHF exacerbation.  Concern for possible COPD component. Xray with worsened right pleural effusion.  Patient given IV lasix, solumedrol, breathing treatment.  Discussed with Dr. Loleta Books who agreed with admission and patient was admitted to [facility] on Telemetry under his care.  Patient and daughter updated on results and plan of care. Final diagnoses:  Hypoxia  Congestive heart failure, unspecified congestive heart failure chronicity, unspecified congestive heart failure type [facility])  Atrial fibrillation, unspecified type ([place])    I personally performed the services described in this documentation, which was scribed in my presence. The recorded information has been reviewed and is  accurate.    Beth Quale, MD 01/06/15 [id number]

## 2015-01-05 NOTE — ED Notes (Signed)
Sob and cough x 10 days. She was seen upstairs and told to come here to r/o pneumonia.

## 2015-01-05 NOTE — Progress Notes (Signed)
Pt arrived with care line. She is alert, oriented and vital signs are stable.

## 2015-01-05 NOTE — Patient Instructions (Signed)
Please proceed to the ER for further evaluation

## 2015-01-05 NOTE — ED Notes (Addendum)
Daughter's phone number, Thayer Headings, [phone]

## 2015-01-05 NOTE — ED Notes (Signed)
Pt used bedside commode prior to transport.  Pt transported to [facility] via [facility].

## 2015-01-05 NOTE — Progress Notes (Signed)
Pre visit review using our clinic review tool, if applicable. No additional management support is needed unless otherwise documented below in the visit note. 

## 2015-01-05 NOTE — Telephone Encounter (Signed)
Returned [employer].  Explained that per documentation from her last office visit, no changes or adjustment's were made to the patient's device.  Advised that she send a manual transmission for review.  Thayer Headings states her mother has an appointment this afternoon with another doctor, but that she will send a transmission this afternoon or this evening.  Advised that if transmission is sent after 5:00pm, it will not be reviewed until tomorrow morning.  Thayer Headings is aware and agreeable to this plan.  Aware to call with worsening symptoms, additional questions, or concerns.  Thayer Headings voices appreciation of return call.

## 2015-01-06 ENCOUNTER — Inpatient Hospital Stay (HOSPITAL_COMMUNITY): Payer: Medicare Other

## 2015-01-06 DIAGNOSIS — I4891 Unspecified atrial fibrillation: Secondary | ICD-10-CM

## 2015-01-06 DIAGNOSIS — I5043 Acute on chronic combined systolic (congestive) and diastolic (congestive) heart failure: Secondary | ICD-10-CM | POA: Diagnosis present

## 2015-01-06 DIAGNOSIS — I10 Essential (primary) hypertension: Secondary | ICD-10-CM

## 2015-01-06 DIAGNOSIS — I509 Heart failure, unspecified: Secondary | ICD-10-CM

## 2015-01-06 DIAGNOSIS — R918 Other nonspecific abnormal finding of lung field: Secondary | ICD-10-CM | POA: Diagnosis present

## 2015-01-06 LAB — CBC WITH DIFFERENTIAL/PLATELET
Basophils Absolute: 0 10*3/uL (ref 0.0–0.1)
Basophils Relative: 0 %
Eosinophils Absolute: 0 10*3/uL (ref 0.0–0.7)
Eosinophils Relative: 0 %
HEMATOCRIT: 35.9 % — AB (ref 36.0–46.0)
HEMOGLOBIN: 12 g/dL (ref 12.0–15.0)
LYMPHS ABS: 0.6 10*3/uL — AB (ref 0.7–4.0)
LYMPHS PCT: 11 %
MCH: 29.6 pg (ref 26.0–34.0)
MCHC: 33.4 g/dL (ref 30.0–36.0)
MCV: 88.6 fL (ref 78.0–100.0)
MONOS PCT: 1 %
Monocytes Absolute: 0 10*3/uL — ABNORMAL LOW (ref 0.1–1.0)
NEUTROS ABS: 4.6 10*3/uL (ref 1.7–7.7)
NEUTROS PCT: 88 %
Platelets: 390 10*3/uL (ref 150–400)
RBC: 4.05 MIL/uL (ref 3.87–5.11)
RDW: 12.6 % (ref 11.5–15.5)
WBC: 5.2 10*3/uL (ref 4.0–10.5)

## 2015-01-06 LAB — MAGNESIUM: Magnesium: 2 mg/dL (ref 1.7–2.4)

## 2015-01-06 LAB — TROPONIN I
Troponin I: <0.03 ng/mL (ref <0.031)
Troponin I: <0.03 ng/mL (ref <0.031)
Troponin I: <0.03 ng/mL (ref <0.031)

## 2015-01-06 LAB — CBC
HEMATOCRIT: 33.1 % — AB (ref 36.0–46.0)
HEMOGLOBIN: 11.4 g/dL — AB (ref 12.0–15.0)
MCH: 30.5 pg (ref 26.0–34.0)
MCHC: 34.4 g/dL (ref 30.0–36.0)
MCV: 88.5 fL (ref 78.0–100.0)
Platelets: 397 10*3/uL (ref 150–400)
RBC: 3.74 MIL/uL — ABNORMAL LOW (ref 3.87–5.11)
RDW: 12.8 % (ref 11.5–15.5)
WBC: 6.7 10*3/uL (ref 4.0–10.5)

## 2015-01-06 LAB — BASIC METABOLIC PANEL
Anion gap: 12 (ref 5–15)
BUN: 12 mg/dL (ref 6–20)
CHLORIDE: 98 mmol/L — AB (ref 101–111)
CO2: 28 mmol/L (ref 22–32)
Calcium: 9.4 mg/dL (ref 8.9–10.3)
Creatinine, Ser: 0.7 mg/dL (ref 0.44–1.00)
GFR calc non Af Amer: >60 mL/min (ref >60)
Glucose, Bld: 166 mg/dL — ABNORMAL HIGH (ref 65–99)
Potassium: 3.6 mmol/L (ref 3.5–5.1)
Sodium: 138 mmol/L (ref 135–145)

## 2015-01-06 LAB — CREATININE, SERUM
Creatinine, Ser: 0.82 mg/dL (ref 0.44–1.00)
GFR calc Af Amer: >60 mL/min (ref >60)

## 2015-01-06 LAB — TSH: TSH: 1.206 u[IU]/mL (ref 0.350–4.500)

## 2015-01-06 MED ORDER — OMEGA-3-ACID ETHYL ESTERS 1 G PO CAPS
1.0000 g | ORAL_CAPSULE | Freq: Every day | ORAL | Status: DC
  Administered 2015-01-06 – 2015-01-09 (×4): 1 g via ORAL
  Filled 2015-01-06 (×4): qty 1

## 2015-01-06 MED ORDER — ASPIRIN 81 MG PO CHEW
81.0000 mg | CHEWABLE_TABLET | Freq: Every day | ORAL | Status: DC
  Administered 2015-01-06 – 2015-01-09 (×4): 81 mg via ORAL
  Filled 2015-01-06 (×4): qty 1

## 2015-01-06 MED ORDER — IOHEXOL 300 MG/ML  SOLN
80.0000 mL | Freq: Once | INTRAMUSCULAR | Status: AC | PRN
  Administered 2015-01-06: 75 mL via INTRAVENOUS

## 2015-01-06 MED ORDER — ACETAMINOPHEN 650 MG RE SUPP: 650.0000 mg | Freq: Four times a day (QID) | RECTAL | Status: DC | PRN

## 2015-01-06 MED ORDER — SODIUM CHLORIDE 0.9 % IJ SOLN
3.0000 mL | Freq: Two times a day (BID) | INTRAMUSCULAR | Status: DC
  Administered 2015-01-06 – 2015-01-09 (×8): 3 mL via INTRAVENOUS

## 2015-01-06 MED ORDER — ONDANSETRON HCL 4 MG/2ML IJ SOLN: 4.0000 mg | Freq: Four times a day (QID) | INTRAMUSCULAR | Status: DC | PRN

## 2015-01-06 MED ORDER — ONDANSETRON HCL 4 MG PO TABS: 4.0000 mg | ORAL_TABLET | Freq: Four times a day (QID) | ORAL | Status: DC | PRN

## 2015-01-06 MED ORDER — METOPROLOL SUCCINATE ER 25 MG PO TB24
25.0000 mg | ORAL_TABLET | Freq: Every day | ORAL | Status: DC
  Administered 2015-01-06 – 2015-01-09 (×4): 25 mg via ORAL
  Filled 2015-01-06 (×4): qty 1

## 2015-01-06 MED ORDER — FUROSEMIDE 10 MG/ML IJ SOLN
40.0000 mg | Freq: Every day | INTRAMUSCULAR | Status: DC
  Administered 2015-01-06 – 2015-01-08 (×3): 40 mg via INTRAVENOUS
  Filled 2015-01-06 (×3): qty 4

## 2015-01-06 MED ORDER — CALCIUM CARBONATE-VITAMIN D 500-200 MG-UNIT PO TABS
1.0000 | ORAL_TABLET | Freq: Every day | ORAL | Status: DC
  Administered 2015-01-06 – 2015-01-09 (×4): 1 via ORAL
  Filled 2015-01-06 (×4): qty 1

## 2015-01-06 MED ORDER — FLECAINIDE ACETATE 50 MG PO TABS
50.0000 mg | ORAL_TABLET | Freq: Two times a day (BID) | ORAL | Status: DC
  Administered 2015-01-06 – 2015-01-09 (×8): 50 mg via ORAL
  Filled 2015-01-06 (×9): qty 1

## 2015-01-06 MED ORDER — DICLOFENAC EPOLAMINE 1.3 % TD PTCH: 1.0000 | MEDICATED_PATCH | Freq: Every day | TRANSDERMAL | Status: DC | PRN

## 2015-01-06 MED ORDER — DILTIAZEM HCL ER BEADS 240 MG PO CP24
240.0000 mg | ORAL_CAPSULE | Freq: Every day | ORAL | Status: DC
  Administered 2015-01-06 – 2015-01-09 (×4): 240 mg via ORAL
  Filled 2015-01-06 (×8): qty 1

## 2015-01-06 MED ORDER — ENOXAPARIN SODIUM 30 MG/0.3ML ~~LOC~~ SOLN
30.0000 mg | SUBCUTANEOUS | Status: DC
  Administered 2015-01-06 – 2015-01-09 (×4): 30 mg via SUBCUTANEOUS
  Filled 2015-01-06 (×4): qty 0.3

## 2015-01-06 MED ORDER — ADULT MULTIVITAMIN W/MINERALS CH
1.0000 | ORAL_TABLET | Freq: Every day | ORAL | Status: DC
  Administered 2015-01-06 – 2015-01-09 (×4): 1 via ORAL
  Filled 2015-01-06 (×4): qty 1

## 2015-01-06 MED ORDER — LORATADINE 10 MG PO TABS
10.0000 mg | ORAL_TABLET | Freq: Every day | ORAL | Status: DC
  Administered 2015-01-06 – 2015-01-09 (×4): 10 mg via ORAL
  Filled 2015-01-06 (×4): qty 1

## 2015-01-06 MED ORDER — LISINOPRIL 2.5 MG PO TABS: 2.5000 mg | ORAL_TABLET | Freq: Every day | ORAL | Status: DC

## 2015-01-06 MED ORDER — LOSARTAN POTASSIUM 25 MG PO TABS
25.0000 mg | ORAL_TABLET | Freq: Every day | ORAL | Status: DC
  Administered 2015-01-06 – 2015-01-09 (×4): 25 mg via ORAL
  Filled 2015-01-06 (×4): qty 1

## 2015-01-06 MED ORDER — POTASSIUM CHLORIDE CRYS ER 10 MEQ PO TBCR
10.0000 meq | EXTENDED_RELEASE_TABLET | Freq: Every day | ORAL | Status: DC
  Administered 2015-01-06 – 2015-01-09 (×4): 10 meq via ORAL
  Filled 2015-01-06 (×4): qty 1

## 2015-01-06 MED ORDER — ACETAMINOPHEN 325 MG PO TABS: 650.0000 mg | ORAL_TABLET | Freq: Four times a day (QID) | ORAL | Status: DC | PRN

## 2015-01-06 NOTE — Progress Notes (Signed)
Patient admitted this morning. H&P reviewed. Patient seen and examined.  S: Patient states that she feels better. Less short of breath than last night. She is not a very good historian.  O: Vital signs reviewed. Saturation 97% on nasal cannula  Lungs reveals few crackles in the left base. Dullness to percussion, right side. Upper airways are clear. Cardio vascular exam is benign. S1, S2 is normal, regular. No S3, S4 Abdomen is soft, nontender, nondistended. Bowel sounds present. No masses or organomegaly Alert, oriented 3. No Focal neurological deficits.  Blood work results reviewed.  CT scan of the chest report reviewed  A/P: This is a 81 year old Caucasian female who presented with worsening dyspnea.   Concern was about acute on chronic systolic failure. She was started on Lasix with some symptomatic improvement.   She is noted to have a right-sided pleural effusion which is moderate to large. Seen on the CT scan as well. Previous chest x-rays noted. Effusion is new compared to July. She has multiple pulmonary nodules. This has been noted before as well. In the past patient has refused evaluation including biopsy. I have discussed with the patient as well as her daughter, Thayer Headings. I'm concerned that the effusion could be malignant. But to make diagnosis we will need to do a thoracentesis. Patient is very hesitant. She would like to discuss this further with her daughter. Daughter's questions answered. She will talk to the patient this evening.   Continue other treatments as outlined in H&P.  Zacchary Pompei 01/06/2015 1:12 PM

## 2015-01-06 NOTE — H&P (Signed)
[facility] History and Physical  Beth [name] JHE:[id number] DOB: 1924-02-07 DOA: 01/05/2015  Referring physician: Patient was transferred from [facility] [place]. PCP: Penni Homans, MD  Specialists: Dr. Lovena Le. Cardiologist.  Chief Complaint: Fatigue and shortness of breath.  HPI: Beth [name] is a 81 y.o. female with history of atrial fibrillation, pacemaker placement for tachybradycardia syndrome, hypertension and lung nodule was referred to the ER by patient's primary care physician as patient was complaining of increasing fatigue and shortness of breath over the last 2 weeks. Patient also was found to have increasing lower extremity edema. Patient states she has also been having some right-sided chest pain which is gradually improved over the last few days. Patient has been having nonproductive cough denies any fever or chills. In the ER x-ray shows new right-sided pleural effusion and patient was given Lasix and admitted for further management of possible CHF. Patient was admitted in August 2 months ago for chest pain at that time stress Myoview was unremarkable. 2-D echo done showed EF of 45-50%. On exam patient does have elevated JVD. Patient has not been taking her Cozaar.  Review of Systems: As presented in the history of presenting illness, rest negative.  Past Medical History  Diagnosis Date  . Hypertension   . Lung nodule     CT of chest 2007-left loewr lobar mass- evaluated by pulmonary- patient refused further work up  . Tobacco abuse   . Vertigo   . Abdominal pain, unspecified site 08/17/2012  . Other and unspecified hyperlipidemia 08/17/2012  . Loss of weight 01/19/2013  . Allergic state 06/22/2013  . Allergy   . Osteoporosis   . Syncope     fall  . Increased urinary frequency 01/02/2014  . Glaucoma 07/06/2014  . COPD (chronic obstructive pulmonary disease) ([place])   . Atrial fibrillation with rapid ventricular response [facility])    Past Surgical History   Procedure Laterality Date  . Cholecystectomy    . Cesarean section    . Hernia repair    . Abdominal hysterectomy    . Ep implantable device N/A 09/26/2014    Procedure: Pacemaker Implant;  Surgeon: Evans Lance, MD;  Location: [place] [facility];  Service: Cardiovascular;  Laterality: N/A;   Social History:  reports that she has quit smoking. Her smoking use included Cigarettes. She has a 6 pack-year smoking history. She has never used smokeless tobacco. She reports that she does not drink alcohol or use illicit drugs. Where does patient live home. Can patient participate in ADLs? Yes.  Allergies  Allergen Reactions  . Penicillins Shortness Of Breath, Diarrhea and Other (See Comments)    Stomach cramping  . Ciprofloxacin Other (See Comments)    myalgias  . Fexofenadine Other (See Comments)    unknown    Family History:  Family History  Problem Relation Age of Onset  . Heart disease Mother   . Hypertension Mother   . Diabetes Mother   . Diabetes    . Cancer      lung- in distant releatives  . Cancer Brother       Prior to Admission medications   Medication Sig Start Date End Date Taking? Authorizing Ryin Schillo  aspirin 81 MG chewable tablet Chew 1 tablet (81 mg total) by mouth daily. 07/02/13  Yes Orson Eva, MD  calcium-vitamin D (OSCAL WITH D) 500-200 MG-UNIT per tablet Take 1 tablet by mouth daily with breakfast.   Yes Historical Rahkim Rabalais, MD  cetirizine (ZYRTEC) 10 MG  tablet Take 10 mg by mouth every morning.   Yes Historical Velencia Lenart, MD  diclofenac (FLECTOR) 1.3 % PTCH Place 1 patch onto the skin daily as needed (pain).   Yes Historical Jaslyne Beeck, MD  fish oil-omega-3 fatty acids 1000 MG capsule Take 2 g by mouth daily at 12 noon.    Yes Historical Geralyn Figiel, MD  flecainide (TAMBOCOR) 50 MG tablet Take 1 tablet (50 mg total) by mouth every 12 (twelve) hours. 12/04/14  Yes Mosie Lukes, MD  Multiple Vitamin (MULTIVITAMIN) tablet Take 1 tablet by mouth daily at 12 noon.     Yes Historical Denilson Salminen, MD  Saline (SODIUM CHLORIDE) 0.65 % SOLN Place 1 spray into the nose as needed for congestion. 2 sprays each nostril once daily as needed   Yes Historical Akaila Rambo, MD  Wheat Dextrin (BENEFIBER PO) Take 1 Applicatorful by mouth daily.   Yes Historical Zeva Leber, MD  diltiazem (TIAZAC) 240 MG 24 hr capsule TAKE 1 CAPSULE (240 MG TOTAL) BY MOUTH DAILY. 12/04/14   Mosie Lukes, MD  furosemide (LASIX) 20 MG tablet Take 1 tablet (20 mg total) by mouth daily as needed for edema. Patient taking differently: Take 20 mg by mouth daily as needed for edema.  11/03/14   Mosie Lukes, MD  losartan (COZAAR) 25 MG tablet Take 1 tablet (25 mg total) by mouth at bedtime. Patient not taking: Reported on 01/05/2015 12/04/14   Mosie Lukes, MD  metoprolol succinate (TOPROL-XL) 25 MG 24 hr tablet TAKE 1 TABLET (25 MG TOTAL) BY MOUTH DAILY. 12/04/14   Mosie Lukes, MD    Physical Exam: Filed Vitals:   01/05/15 2125 01/05/15 2130 01/05/15 2244 01/05/15 2255  BP: 137/59 151/49  135/48  Pulse: 78 75  75  Temp:  98.1 F (36.7 C)  98.5 F (36.9 C)  TempSrc:    Oral  Resp: 24 21  20   Height:   5' (1.524 m)   Weight:   48.2 kg (106 lb 4.2 oz)   SpO2: 98% 98%  97%     General:  Moderately built and poorly nourished.  Eyes: Anicteric no pallor.  ENT: No discharge from the ears eyes nose or mouth.  Neck: Elevated JVD. No mass felt.  Cardiovascular: S1-S2 heard.  Respiratory: No rhonchi or crepitations.  Abdomen: Soft nontender bowel sounds present.  Skin: No rash.  Musculoskeletal: Mild edema of the lower extremities.  Psychiatric: Appears normal.  Neurologic: Alert awake oriented to time place and person. Moves all extremities.  Labs on Admission:  Basic Metabolic Panel:  Recent Labs Lab 01/05/15 1805  NA 135  K 4.7  CL 101  CO2 28  GLUCOSE 135*  BUN 15  CREATININE 0.83  CALCIUM 9.5   Liver Function Tests:  Recent Labs Lab 01/05/15 1805  AST 25  ALT 13*   ALKPHOS 67  BILITOT 0.7  PROT 8.1  ALBUMIN 3.6   No results for input(s): LIPASE, AMYLASE in the last 168 hours. No results for input(s): AMMONIA in the last 168 hours. CBC:  Recent Labs Lab 01/05/15 0430 01/05/15 1805  WBC  --  9.5  NEUTROABS  --  7.7  HGB 12.7 12.3  HCT  --  37.3  MCV  --  89.2  PLT  --  345   Cardiac Enzymes:  Recent Labs Lab 01/05/15 1805  TROPONINI <0.03    BNP (last 3 results)  Recent Labs  11/17/14 1500 01/05/15 1805  BNP 440.7* 185.8*  ProBNP (last 3 results) No results for input(s): PROBNP in the last 8760 hours.  CBG: No results for input(s): GLUCAP in the last 168 hours.  Radiological Exams on Admission: Dg Chest 2 View  01/05/2015   CLINICAL DATA:  Subacute shortness of breath, cough and weakness for 10 days.  EXAM: CHEST  2 VIEW  COMPARISON:  11/17/2014 and prior radiograph  FINDINGS: Mild cardiomegaly and left-sided pacemaker again noted.  A moderate right pleural effusion has increased in size from the prior study, previously minimal. Associated right lower lung atelectasis identified.  Left pulmonary nodules and COPD/emphysema again noted.  There is no evidence of pneumothorax or acute bony abnormality. A 50% anterior wedge compression fracture of a lower thoracic vertebra is unchanged.  IMPRESSION: Moderate right pleural effusion with right lower lung atelectasis. This acute has significantly increased in size since the prior study.  COPD/ emphysema, cardiomegaly and left pulmonary nodules again noted.   Electronically Signed   By: Margarette Canada M.D.   On: 01/05/2015 18:53    EKG: Independently reviewed. Sinus rhythm with LBBB.  Assessment/Plan Principal Problem:   Acute on chronic systolic CHF (congestive heart failure) (HCC) Active Problems:   Essential hypertension   Atrial fibrillation (HCC)   Pulmonary nodule   CHF (congestive heart failure) ([place])   1. Acute on chronic systolic heart failure with last EF measured in  August 2016 was 45-50% - patient has been placed on Lasix 40 mg IV daily with Cozaar. Closely follow intake output daily weights and metabolic panel. 2. Right-sided pleural effusion with lung nodule and with history of severe smoking - pleural effusion could be from CHF but given history of lung nodules and cigarette smoking I have ordered CT chest for further study. 3. Paroxysmal atrial fibrillation - chest 2 vasc score of 4 patient was felt to be not a candidate for anticoagulation secondary to risk of falls. Continue flecainide and rate limiting medications. 4. Hypertension - Cozaar has been restarted and continue home medications. 5. History of tachybradycardia syndrome status post pacemaker placement.  I have reviewed patient's old charts and labs. Personally reviewed patient's chest x-ray and EKG.   DVT Prophylaxis Lovenox.  Code Status: Patient is okay with CPR but does not want to be intubated or have any cardioversion.  Family Communication: Discussed with patient.  Disposition Plan: Admit to inpatient.    KAKRAKANDY,ARSHAD N. [facility] Pager [phone].  If 7PM-7AM, please contact night-coverage [email or web address] Password [email or web address] 01/06/2015, 1:37 AM

## 2015-01-06 NOTE — Care Management Note (Signed)
Case Management Note  Patient Details  Name: Beth [name] MRN: [id number] Date of Birth: [birth year]  Subjective/Objective:     Pt admitted with acute on chronic HF               Action/Plan:  Pt states she is independent from home.  CM will assess for HF screen and will continue to monitor for disposition needs.   Expected Discharge Date:                  Expected Discharge Plan:  Home/Self Care  In-House Referral:     Discharge planning Services  CM Consult  Post Acute Care Choice:    Choice offered to:     DME Arranged:    DME Agency:     HH Arranged:    HH Agency:     Status of Service:  In process, will continue to follow  Medicare Important Message Given:    Date Medicare IM Given:    Medicare IM give by:    Date Additional Medicare IM Given:    Additional Medicare Important Message give by:     If discussed at [place] of Stay Meetings, dates discussed:    Additional Comments: CM assessed pt.  Pt states she ambulates independently when home.  CM asked if pt thought PT/OT evaluation would be beneficial at this time; pt refused and stated "I get around fine at home".  Pt stated she did not have a scale at home but communicated she could obtain one.  CM informed pt to weigh self daily and to report to MD if she experienced sudden weight gain.  Pt stated she has been on a no sodium diet for a while.   CM will continue to monitor. [name], RN 01/06/2015, 3:44 PM

## 2015-01-07 ENCOUNTER — Inpatient Hospital Stay (HOSPITAL_COMMUNITY): Payer: Medicare Other

## 2015-01-07 HISTORY — PX: THORACENTESIS: SHX235

## 2015-01-07 LAB — LACTATE DEHYDROGENASE, PLEURAL OR PERITONEAL FLUID: LD FL: 133 U/L — AB (ref 3–23)

## 2015-01-07 LAB — BASIC METABOLIC PANEL
Anion gap: 12 (ref 5–15)
BUN: 12 mg/dL (ref 6–20)
CHLORIDE: 95 mmol/L — AB (ref 101–111)
CO2: 29 mmol/L (ref 22–32)
Calcium: 9 mg/dL (ref 8.9–10.3)
Creatinine, Ser: 0.74 mg/dL (ref 0.44–1.00)
Glucose, Bld: 165 mg/dL — ABNORMAL HIGH (ref 65–99)
POTASSIUM: 3.3 mmol/L — AB (ref 3.5–5.1)
SODIUM: 136 mmol/L (ref 135–145)

## 2015-01-07 LAB — GRAM STAIN

## 2015-01-07 LAB — PROTEIN, BODY FLUID: Total protein, fluid: 5 g/dL

## 2015-01-07 LAB — BODY FLUID CELL COUNT WITH DIFFERENTIAL
EOS FL: 3 %
LYMPHS FL: 78 %
MONOCYTE-MACROPHAGE-SEROUS FLUID: 16 % — AB (ref 50–90)
NEUTROPHIL FLUID: 3 % (ref 0–25)
Total Nucleated Cell Count, Fluid: 2032 cu mm — ABNORMAL HIGH (ref 0–1000)

## 2015-01-07 LAB — LACTATE DEHYDROGENASE: LDH: 208 U/L — AB (ref 98–192)

## 2015-01-07 LAB — PROTEIN, TOTAL: TOTAL PROTEIN: 7.2 g/dL (ref 6.5–8.1)

## 2015-01-07 MED ORDER — POTASSIUM CHLORIDE CRYS ER 20 MEQ PO TBCR
40.0000 meq | EXTENDED_RELEASE_TABLET | Freq: Two times a day (BID) | ORAL | Status: AC
  Administered 2015-01-07 (×2): 40 meq via ORAL
  Filled 2015-01-07 (×2): qty 2

## 2015-01-07 MED ORDER — LIDOCAINE HCL (PF) 1 % IJ SOLN
INTRAMUSCULAR | Status: AC
  Filled 2015-01-07: qty 10

## 2015-01-07 NOTE — Progress Notes (Signed)
UR Completed. Addilyn Satterwhite, RN, BSN.  [phone] 

## 2015-01-07 NOTE — Progress Notes (Signed)
Patient Demographics  Beth [name], is a 81 y.o. female, DOB - [birth year], RAQ:[id number]  Admit date - 01/05/2015   Admitting Physician Edwin Dada, MD  Outpatient Primary MD for the patient is Penni Homans, MD  LOS - 2   Chief Complaint  Patient presents with  . Shortness of Breath         Subjective:   Beth [name] today has, No headache, No chest pain, No abdominal pain - No Nausea, No new weakness tingling or numbness, No Cough , reports her dyspnea is improving.  Assessment & Plan    Principal Problem:   Acute on chronic systolic CHF (congestive heart failure) (HCC) Active Problems:   Essential hypertension   Atrial fibrillation (HCC)   Pulmonary nodule   CHF (congestive heart failure) (HCC)  Dyspnea - This is most likely related to right pleural effusion  Right Pleural effusion - Unclear etiology, might be related to CHF process, possible malignancy as well giving multiple pulmonary nodules on imaging, discussed with patient, she is agreeable to thoracentesis, further workup depends on thoracentesis findings.  Acute on chronic combined diastolic and systolic CHF  - Patient with dyspnea , and pleural effusion , most recent echo in July 2016 showing EF 45-50% with grade 1 diastolic dysfunction . - Continue with IV diuresis, daily weights, strict ins and outs . - Continue with metoprolol and losartan .  Multiple pulmonary nodules - Progressive in imaging, refused workup in the past, await thoracentesis results.  Paroxysmal atrial fibrillation  - [place] vasc score of 4, felt not a candidate for anticoagulation secondary to falls  - Heart rate controlled on diltiazem and metoprolol and flecainide .  Hypokalemia - Repleted, recheck in a.m.  Hypertension  - Continue with home medication   History tachybradycardia syndrome  - Status post permanent pacemaker  placement   Code Status: Partial  Family Communication: None at bedside  Disposition Plan: Pending further workup   Procedures  None   Consults   None   Medications  Scheduled Meds: . aspirin  81 mg Oral Daily  . calcium-vitamin D  1 tablet Oral Q breakfast  . diltiazem  240 mg Oral Daily  . enoxaparin (LOVENOX) injection  30 mg Subcutaneous Q24H  . flecainide  50 mg Oral Q12H  . furosemide  40 mg Intravenous Daily  . loratadine  10 mg Oral Daily  . losartan  25 mg Oral Daily  . metoprolol succinate  25 mg Oral Daily  . multivitamin with minerals  1 tablet Oral Q1200  . omega-3 acid ethyl esters  1 g Oral Q1200  . potassium chloride  10 mEq Oral Daily  . sodium chloride  3 mL Intravenous Q12H   Continuous Infusions:  PRN Meds:.acetaminophen **OR** acetaminophen, diclofenac, ondansetron **OR** ondansetron (ZOFRAN) IV  DVT Prophylaxis  Lovenox -   Lab Results  Component Value Date   PLT 390 01/06/2015   Antibiotics    Anti-infectives    None          Objective:   Filed Vitals:   01/06/15 1404 01/06/15 2038 01/07/15 0422 01/07/15 0431  BP: 158/68 166/57  143/55  Pulse: 79 72  77  Temp: 97.7 F (36.5 C) 98.5 F (36.9 C)  97.5 F (36.4 C)  TempSrc: Oral Oral  Oral  Resp: 17 18  16   Height:      Weight:   47.219 kg (104 lb 1.6 oz)   SpO2: 98% 100%  97%    Wt Readings from Last 3 Encounters:  01/07/15 47.219 kg (104 lb 1.6 oz)  01/05/15 50.712 kg (111 lb 12.8 oz)  12/23/14 48.081 kg (106 lb)     Intake/Output Summary (Last 24 hours) at 01/07/15 1120 Last data filed at 01/07/15 1032  Gross per 24 hour  Intake    720 ml  Output   4800 ml  Net  -4080 ml     Physical Exam  Awake Alert, Oriented X 3, No new F.N deficits, Normal affect [place].AT,PERRAL Supple Neck,No JVD, No cervical lymphadenopathy appriciated.  Symmetrical Chest wall movement, increased air entry in the right, no wheezing . No Gallops,Rubs or new Murmurs, No Parasternal  Heave +ve B.Sounds, Abd Soft, No tenderness, No organomegaly appriciated, No rebound - guarding or rigidity. No Cyanosis, Clubbing or edema, No new Rash or bruise     Data Review   Micro Results No results found for this or any previous visit (from the past 240 hour(s)).  Radiology Reports Dg Chest 2 View  01/05/2015  CLINICAL DATA:  Subacute shortness of breath, cough and weakness for 10 days. EXAM: CHEST  2 VIEW COMPARISON:  11/17/2014 and prior radiograph FINDINGS: Mild cardiomegaly and left-sided pacemaker again noted. A moderate right pleural effusion has increased in size from the prior study, previously minimal. Associated right lower lung atelectasis identified. Left pulmonary nodules and COPD/emphysema again noted. There is no evidence of pneumothorax or acute bony abnormality. A 50% anterior wedge compression fracture of a lower thoracic vertebra is unchanged. IMPRESSION: Moderate right pleural effusion with right lower lung atelectasis. This acute has significantly increased in size since the prior study. COPD/ emphysema, cardiomegaly and left pulmonary nodules again noted. Electronically Signed   By: Margarette Canada M.D.   On: 01/05/2015 18:53   Ct Chest W Contrast  01/06/2015  CLINICAL DATA:  Pleural effusion and pulmonary nodule, recumbent dyspnea, history of skin cancer and pacemaker EXAM: CT CHEST WITH CONTRAST TECHNIQUE: Multidetector CT imaging of the chest was performed during intravenous contrast administration. CONTRAST:  46mL OMNIPAQUE IOHEXOL 300 MG/ML  SOLN COMPARISON:  08/12/05, 01/05/15 FINDINGS: There is a moderate to large right pleural effusion. There is compressive atelectasis of the right lower lobe. There is no significant hilar or mediastinal adenopathy. There is no pericardial effusion. The thoracic aorta shows no dissection or dilitation, although it ismoderately calcified. There is a two lead cardiac pacer with generator over the left thorax. Images through the upper  abdomen are unremarkable. There is a moderate T 11 compression deformity of about 50% with mild retropulsion causing mild canal narrowing. This is new from 07/01/14. As described on report of numerous prior studies there are multiple left lung nodules. In the left lung apex there is a 27 x 21 mm partially calcified pleural based nodule, increased from 27 x 12 mm on 08/02/2005 CT scan. In the superior segment left lower lobe there is a partially calcified 14 x 15 mm nodule new from 2007. Just inferior to this is a partially calcified 9 mm nodule. Two sub cm nodules are seen just further inferiorly on image 18. In the posterior left lower lobe there is a 13 mm noncalcified nodule. There is a 14 mm noncalcified nodule on image number 31 in the  left lower lobe. More anteriorly there is 13 mm nodule on image number 33 and a 7 mm nodule on image number 34 both new from 2007. There is a 22 mm partially calcified nodule on image number 38 in the left lower lobe, which is increased in size from 2007 at which time it measured 18 mm and did not demonstrate calcification. There is a new 8 mm nodule in the anterior left lung base. On the right, on image number 31 there is a 16 mm nodule which previously measured about 4 mm in 2007. There is an indistinct 7 mm nodule on image number 38 at the right lung base. IMPRESSION: 1. Numerous progressive partially calcified lung nodules. Differential diagnostic possibilities include treated as well as untreated various metastatic diseases. TB, histoplasmosis, and pulmonary amyloidosis are other considerations, as would be silicosis or other occupational lung diseases. There is no calcified adenopathy as would be expected with TB, histoplasmosis, or occupational exposure. 2. Right pleural effusion. 3. Acute to subacute T 11 compression deformity, with mild canal narrowing. Electronically Signed   By: Skipper Cliche M.D.   On: 01/06/2015 10:00     CBC  Recent Labs Lab 01/05/15 0430  01/05/15 1805 01/06/15 0150 01/06/15 0802  WBC  --  9.5 6.7 5.2  HGB 12.7 12.3 11.4* 12.0  HCT  --  37.3 33.1* 35.9*  PLT  --  345 397 390  MCV  --  89.2 88.5 88.6  MCH  --  29.4 30.5 29.6  MCHC  --  33.0 34.4 33.4  RDW  --  12.6 12.8 12.6  LYMPHSABS  --  0.9  --  0.6*  MONOABS  --  0.7  --  0.0*  EOSABS  --  0.2  --  0.0  BASOSABS  --  0.0  --  0.0    Chemistries   Recent Labs Lab 01/05/15 1805 01/06/15 0150 01/06/15 0802 01/07/15 0418  NA 135  --  138 136  K 4.7  --  3.6 3.3*  CL 101  --  98* 95*  CO2 28  --  28 29  GLUCOSE 135*  --  166* 165*  BUN 15  --  12 12  CREATININE 0.83 0.82 0.70 0.74  CALCIUM 9.5  --  9.4 9.0  MG  --   --  2.0  --   AST 25  --   --   --   ALT 13*  --   --   --   ALKPHOS 67  --   --   --   BILITOT 0.7  --   --   --    ------------------------------------------------------------------------------------------------------------------ estimated creatinine clearance is 34.2 mL/min (by C-G formula based on Cr of 0.74). ------------------------------------------------------------------------------------------------------------------ No results for input(s): HGBA1C in the last 72 hours. ------------------------------------------------------------------------------------------------------------------ No results for input(s): CHOL, HDL, LDLCALC, TRIG, CHOLHDL, LDLDIRECT in the last 72 hours. ------------------------------------------------------------------------------------------------------------------  Recent Labs  01/06/15 0150  TSH 1.206   ------------------------------------------------------------------------------------------------------------------ No results for input(s): VITAMINB12, FOLATE, FERRITIN, TIBC, IRON, RETICCTPCT in the last 72 hours.  Coagulation profile No results for input(s): INR, PROTIME in the last 168 hours.  No results for input(s): DDIMER in the last 72 hours.  Cardiac Enzymes  Recent Labs Lab  01/06/15 0150 01/06/15 0802 01/06/15 1350  TROPONINI <0.03 <0.03 <0.03   ------------------------------------------------------------------------------------------------------------------ Invalid input(s): POCBNP     Time Spent in minutes   40 Lacinda Axon, Amanee Iacovelli M.D on 01/07/2015 at 11:20 AM  Between  7am to 7pm - Pager - [phone]  After 7pm go to [email or web address] - password [facility]  [facility]   Office  [phone]

## 2015-01-07 NOTE — Procedures (Signed)
  US guided Rt thora 1.2 liters yellow fluid  Sent for labs per MD  cxr pending

## 2015-01-08 LAB — PH, BODY FLUID: PH, BODY FLUID: 8

## 2015-01-08 LAB — BASIC METABOLIC PANEL
Anion gap: 6 (ref 5–15)
BUN: 13 mg/dL (ref 6–20)
CO2: 32 mmol/L (ref 22–32)
Calcium: 8.6 mg/dL — ABNORMAL LOW (ref 8.9–10.3)
Chloride: 95 mmol/L — ABNORMAL LOW (ref 101–111)
Creatinine, Ser: 0.71 mg/dL (ref 0.44–1.00)
GFR calc Af Amer: >60 mL/min (ref >60)
GLUCOSE: 89 mg/dL (ref 65–99)
POTASSIUM: 4 mmol/L (ref 3.5–5.1)
Sodium: 133 mmol/L — ABNORMAL LOW (ref 135–145)

## 2015-01-08 MED ORDER — FUROSEMIDE 40 MG PO TABS
40.0000 mg | ORAL_TABLET | Freq: Every day | ORAL | Status: DC
  Administered 2015-01-08 – 2015-01-09 (×2): 40 mg via ORAL
  Filled 2015-01-08 (×2): qty 1

## 2015-01-08 NOTE — Plan of Care (Signed)
Problem: Phase I Progression Outcomes Goal: Voiding-avoid urinary catheter unless indicated Outcome: Progressing Pt has voided a total of 2 liter this shift. Po fluid intake 680 ml.

## 2015-01-08 NOTE — Care Management Important Message (Signed)
Important Message  Patient Details  Name: Beth [name] MRN: [id number] Date of Birth: 28-Apr-1923   Medicare Important Message Given:  Yes-second notification given    Nathen May 01/08/2015, 10:40 AM

## 2015-01-08 NOTE — Progress Notes (Signed)
Patient Demographics  Beth [name], is a 81 y.o. female, DOB - 08-Dec-1923, CWU:[id number]  Admit date - 01/05/2015   Admitting Physician Edwin Dada, MD  Outpatient Primary MD for the patient is Penni Homans, MD  LOS - 3   Chief Complaint  Patient presents with  . Shortness of Breath         Subjective:   Beth [name] today has, No headache, No chest pain, No abdominal pain - No Nausea, No new weakness tingling or numbness, No Cough , reports her dyspnea is improving.  Assessment & Plan    Principal Problem:   Acute on chronic systolic CHF (congestive heart failure) (HCC) Active Problems:   Essential hypertension   Atrial fibrillation (HCC)   Pulmonary nodule   CHF (congestive heart failure) (HCC)  Dyspnea - This is  related to right pleural effusion, significantly improved after thoracentesis on 01/07/2015.  Right Pleural effusion - Status post thoracentesis 01/07/2015, 1.2 L was drained. - Workup significant for exudate as well blood cell 2032, 78% lymphs, protein  ratio > 5, LDH ratio  > 0.6, cytology still pending, : Cultures and Gram stain. - Unclear etiology, might be related to CHF process, possible malignancy as well giving multiple pulmonary nodules on imaging.  Acute on chronic combined diastolic and systolic CHF  - Patient with dyspnea , and pleural effusion , most recent echo in July 2016 showing EF 45-50% with grade 1 diastolic dysfunction . - IV Lasix to by mouth giving her significant diuresis as -7.350 since admission, on daily weights, strict ins and outs . - Continue with metoprolol and losartan .  Multiple pulmonary nodules - Progressive in imaging, refused workup in the past, await cytology results.  Paroxysmal atrial fibrillation  - [place] vasc score of 4, felt not a candidate for anticoagulation secondary to falls  - Heart rate controlled on  diltiazem and metoprolol and flecainide .  Hypokalemia - Repleted, .  Hyponatremia - Mild, most likely related to diuresis, and patient having hypotonic fluid intake, will have patient on fluid restriction 1200 mL.  Hypertension  - Continue with home medication   History tachybradycardia syndrome  - Status post permanent pacemaker placement   Code Status: Partial  Family Communication: None at bedside  Disposition Plan: Pending further workup   Procedures  Thoracentesis 10/12 with 1.2 L drained   Consults   None   Medications  Scheduled Meds: . aspirin  81 mg Oral Daily  . calcium-vitamin D  1 tablet Oral Q breakfast  . diltiazem  240 mg Oral Daily  . enoxaparin (LOVENOX) injection  30 mg Subcutaneous Q24H  . flecainide  50 mg Oral Q12H  . furosemide  40 mg Oral Daily  . loratadine  10 mg Oral Daily  . losartan  25 mg Oral Daily  . metoprolol succinate  25 mg Oral Daily  . multivitamin with minerals  1 tablet Oral Q1200  . omega-3 acid ethyl esters  1 g Oral Q1200  . potassium chloride  10 mEq Oral Daily  . sodium chloride  3 mL Intravenous Q12H   Continuous Infusions:  PRN Meds:.acetaminophen **OR** acetaminophen, diclofenac, ondansetron **OR** ondansetron (ZOFRAN) IV  DVT Prophylaxis  Lovenox -   Lab Results  Component Value Date   PLT 390 01/06/2015   Antibiotics    Anti-infectives    None          Objective:   Filed Vitals:   01/07/15 1558 01/07/15 2046 01/08/15 0457 01/08/15 1120  BP: 136/46 122/46 116/43 130/46  Pulse:  64 61   Temp:  99 F (37.2 C) 98 F (36.7 C)   TempSrc:  Oral Oral   Resp:  18 18   Height:      Weight:   45.6 kg (100 lb 8.5 oz)   SpO2:  91% 92%     Wt Readings from Last 3 Encounters:  01/08/15 45.6 kg (100 lb 8.5 oz)  01/05/15 50.712 kg (111 lb 12.8 oz)  12/23/14 48.081 kg (106 lb)     Intake/Output Summary (Last 24 hours) at 01/08/15 1322 Last data filed at 01/08/15 1303  Gross per 24 hour  Intake     480 ml  Output   1400 ml  Net   -920 ml     Physical Exam  Awake Alert, Oriented X 3, No new F.N deficits, Normal affect [place].AT,PERRAL Supple Neck,No JVD, No cervical lymphadenopathy appriciated.  Symmetrical Chest wall movement, improved air entry in the right, no wheezing No Gallops,Rubs or new Murmurs, No Parasternal Heave +ve B.Sounds, Abd Soft, No tenderness, No organomegaly appriciated, No rebound - guarding or rigidity. No Cyanosis, Clubbing or edema, No new Rash or bruise     Data Review   Micro Results Recent Results (from the past 240 hour(s))  Gram stain     Status: None   Collection Time: 01/07/15  3:51 PM  Result Value Ref Range Status   Specimen Description FLUID RIGHT PLEURAL  Final   Special Requests NONE  Final   Gram Stain   Final    WBC PRESENT, PREDOMINANTLY MONONUCLEAR NO ORGANISMS SEEN CYTOSPIN    Report Status 01/07/2015 FINAL  Final    Radiology Reports Dg Chest 1 View  01/07/2015  CLINICAL DATA:  Status post right-sided thoracentesis EXAM: CHEST 1 VIEW COMPARISON:  Chest radiograph January 05, 2015 and January 06, 2015 chest CT FINDINGS: No pneumothorax. Only a small residual right pleural effusion remains. Multiple nodular lesions are noted bilaterally, more on the left than on the right, stable. There is underlying emphysema. No new opacity seen. Heart size and pulmonary vascularity are normal. No adenopathy is apparent. Pacemaker leads are attached to the right atrium and right ventricle. IMPRESSION: No pneumothorax following thoracentesis with removal of most of the effusion from the right side. Small residual effusion remains on the right. Multiple nodular lesions remain, stable. No new opacity. Underlying emphysema. Pacemaker leads attached to right heart. Electronically Signed   By: Lowella Grip III M.D.   On: 01/07/2015 16:34   Dg Chest 2 View  01/05/2015  CLINICAL DATA:  Subacute shortness of breath, cough and weakness for 10 days. EXAM:  CHEST  2 VIEW COMPARISON:  11/17/2014 and prior radiograph FINDINGS: Mild cardiomegaly and left-sided pacemaker again noted. A moderate right pleural effusion has increased in size from the prior study, previously minimal. Associated right lower lung atelectasis identified. Left pulmonary nodules and COPD/emphysema again noted. There is no evidence of pneumothorax or acute bony abnormality. A 50% anterior wedge compression fracture of a lower thoracic vertebra is unchanged. IMPRESSION: Moderate right pleural effusion with right lower lung atelectasis. This acute has significantly increased in size since the prior study. COPD/ emphysema, cardiomegaly and left pulmonary nodules again noted.  Electronically Signed   By: Margarette Canada M.D.   On: 01/05/2015 18:53   Ct Chest W Contrast  01/06/2015  CLINICAL DATA:  Pleural effusion and pulmonary nodule, recumbent dyspnea, history of skin cancer and pacemaker EXAM: CT CHEST WITH CONTRAST TECHNIQUE: Multidetector CT imaging of the chest was performed during intravenous contrast administration. CONTRAST:  35mL OMNIPAQUE IOHEXOL 300 MG/ML  SOLN COMPARISON:  08/12/05, 01/05/15 FINDINGS: There is a moderate to large right pleural effusion. There is compressive atelectasis of the right lower lobe. There is no significant hilar or mediastinal adenopathy. There is no pericardial effusion. The thoracic aorta shows no dissection or dilitation, although it ismoderately calcified. There is a two lead cardiac pacer with generator over the left thorax. Images through the upper abdomen are unremarkable. There is a moderate T 11 compression deformity of about 50% with mild retropulsion causing mild canal narrowing. This is new from 07/01/14. As described on report of numerous prior studies there are multiple left lung nodules. In the left lung apex there is a 27 x 21 mm partially calcified pleural based nodule, increased from 27 x 12 mm on 08/02/2005 CT scan. In the superior segment left  lower lobe there is a partially calcified 14 x 15 mm nodule new from 2007. Just inferior to this is a partially calcified 9 mm nodule. Two sub cm nodules are seen just further inferiorly on image 18. In the posterior left lower lobe there is a 13 mm noncalcified nodule. There is a 14 mm noncalcified nodule on image number 31 in the left lower lobe. More anteriorly there is 13 mm nodule on image number 33 and a 7 mm nodule on image number 34 both new from 2007. There is a 22 mm partially calcified nodule on image number 38 in the left lower lobe, which is increased in size from 2007 at which time it measured 18 mm and did not demonstrate calcification. There is a new 8 mm nodule in the anterior left lung base. On the right, on image number 31 there is a 16 mm nodule which previously measured about 4 mm in 2007. There is an indistinct 7 mm nodule on image number 38 at the right lung base. IMPRESSION: 1. Numerous progressive partially calcified lung nodules. Differential diagnostic possibilities include treated as well as untreated various metastatic diseases. TB, histoplasmosis, and pulmonary amyloidosis are other considerations, as would be silicosis or other occupational lung diseases. There is no calcified adenopathy as would be expected with TB, histoplasmosis, or occupational exposure. 2. Right pleural effusion. 3. Acute to subacute T 11 compression deformity, with mild canal narrowing. Electronically Signed   By: Skipper Cliche M.D.   On: 01/06/2015 10:00   US Thoracentesis Asp Pleural Space W/img Guide  01/07/2015  INDICATION: Symptomatic R sided pleural effusion EXAM: US THORACENTESIS ASP PLEURAL SPACE W/IMG GUIDE COMPARISON:  None MEDICATIONS: 10 cc 1% lidocaine COMPLICATIONS: None immediate TECHNIQUE: Informed written consent was obtained from the patient after a discussion of the risks, benefits and alternatives to treatment. A timeout was performed prior to the initiation of the procedure. Initial  ultrasound scanning demonstrates a Right pleural effusion. The lower chest was prepped and draped in the usual sterile fashion. 1% lidocaine was used for local anesthesia. Under direct ultrasound guidance, a 19 gauge, 7-cm, Yueh catheter was introduced. An ultrasound image was saved for documentation purposes. The thoracentesis was performed. The catheter was removed and a dressing was applied. The patient tolerated the procedure well without immediate  post procedural complication. The patient was escorted to have an upright chest radiograph. FINDINGS: A total of approximately 1.2 liters of yellow fluid was removed. Requested samples were sent to the laboratory. IMPRESSION: Successful ultrasound-guided R sided thoracentesis yielding 1.2 liters of pleural fluid. Read By:  Lavonia Drafts [facility] Electronically Signed   By: Marybelle Killings M.D.   On: 01/07/2015 16:10     CBC  Recent Labs Lab 01/05/15 0430 01/05/15 1805 01/06/15 0150 01/06/15 0802  WBC  --  9.5 6.7 5.2  HGB 12.7 12.3 11.4* 12.0  HCT  --  37.3 33.1* 35.9*  PLT  --  345 397 390  MCV  --  89.2 88.5 88.6  MCH  --  29.4 30.5 29.6  MCHC  --  33.0 34.4 33.4  RDW  --  12.6 12.8 12.6  LYMPHSABS  --  0.9  --  0.6*  MONOABS  --  0.7  --  0.0*  EOSABS  --  0.2  --  0.0  BASOSABS  --  0.0  --  0.0    Chemistries   Recent Labs Lab 01/05/15 1805 01/06/15 0150 01/06/15 0802 01/07/15 0418 01/08/15 0518  NA 135  --  138 136 133*  K 4.7  --  3.6 3.3* 4.0  CL 101  --  98* 95* 95*  CO2 28  --  28 29 32  GLUCOSE 135*  --  166* 165* 89  BUN 15  --  12 12 13   CREATININE 0.83 0.82 0.70 0.74 0.71  CALCIUM 9.5  --  9.4 9.0 8.6*  MG  --   --  2.0  --   --   AST 25  --   --   --   --   ALT 13*  --   --   --   --   ALKPHOS 67  --   --   --   --   BILITOT 0.7  --   --   --   --    ------------------------------------------------------------------------------------------------------------------ estimated creatinine clearance is 34.2 mL/min  (by C-G formula based on Cr of 0.71). ------------------------------------------------------------------------------------------------------------------ No results for input(s): HGBA1C in the last 72 hours. ------------------------------------------------------------------------------------------------------------------ No results for input(s): CHOL, HDL, LDLCALC, TRIG, CHOLHDL, LDLDIRECT in the last 72 hours. ------------------------------------------------------------------------------------------------------------------  Recent Labs  01/06/15 0150  TSH 1.206   ------------------------------------------------------------------------------------------------------------------ No results for input(s): VITAMINB12, FOLATE, FERRITIN, TIBC, IRON, RETICCTPCT in the last 72 hours.  Coagulation profile No results for input(s): INR, PROTIME in the last 168 hours.  No results for input(s): DDIMER in the last 72 hours.  Cardiac Enzymes  Recent Labs Lab 01/06/15 0150 01/06/15 0802 01/06/15 1350  TROPONINI <0.03 <0.03 <0.03   ------------------------------------------------------------------------------------------------------------------ Invalid input(s): POCBNP     Time Spent in minutes   35 Lacinda Axon, DAWOOD M.D on 01/08/2015 at 1:22 PM  Between 7am to 7pm - Pager - [phone]  After 7pm go to [email or web address] - password [facility]  [facility]   Office  [phone]

## 2015-01-09 ENCOUNTER — Encounter (HOSPITAL_COMMUNITY): Payer: Self-pay | Admitting: Pulmonary Disease

## 2015-01-09 ENCOUNTER — Inpatient Hospital Stay (HOSPITAL_COMMUNITY): Payer: Medicare Other

## 2015-01-09 DIAGNOSIS — J90 Pleural effusion, not elsewhere classified: Secondary | ICD-10-CM

## 2015-01-09 DIAGNOSIS — R911 Solitary pulmonary nodule: Secondary | ICD-10-CM

## 2015-01-09 DIAGNOSIS — I5023 Acute on chronic systolic (congestive) heart failure: Principal | ICD-10-CM

## 2015-01-09 DIAGNOSIS — R0600 Dyspnea, unspecified: Secondary | ICD-10-CM

## 2015-01-09 LAB — C-REACTIVE PROTEIN: CRP: 0.8 mg/dL (ref <1.0)

## 2015-01-09 LAB — BASIC METABOLIC PANEL
ANION GAP: 9 (ref 5–15)
BUN: 13 mg/dL (ref 6–20)
CALCIUM: 8.7 mg/dL — AB (ref 8.9–10.3)
CO2: 32 mmol/L (ref 22–32)
CREATININE: 0.73 mg/dL (ref 0.44–1.00)
Chloride: 94 mmol/L — ABNORMAL LOW (ref 101–111)
GFR calc Af Amer: >60 mL/min (ref >60)
GLUCOSE: 98 mg/dL (ref 65–99)
Potassium: 3.4 mmol/L — ABNORMAL LOW (ref 3.5–5.1)
Sodium: 135 mmol/L (ref 135–145)

## 2015-01-09 LAB — SEDIMENTATION RATE: Sed Rate: 53 mm/hr — ABNORMAL HIGH (ref 0–22)

## 2015-01-09 MED ORDER — FUROSEMIDE 20 MG PO TABS: 20.0000 mg | ORAL_TABLET | Freq: Every day | ORAL | Status: DC

## 2015-01-09 MED ORDER — POTASSIUM CHLORIDE CRYS ER 10 MEQ PO TBCR: 10.0000 meq | EXTENDED_RELEASE_TABLET | Freq: Every day | ORAL | Status: DC

## 2015-01-09 NOTE — Discharge Instructions (Signed)
Follow with Primary MD Penni Homans, MD in 7 days  - Follow with pulmonary in 1-2 weeks. Get CBC, CMP, 2 view Chest X ray checked  by Primary MD next visit.    Activity: As tolerated with Full fall precautions use walker/cane & assistance as needed   Disposition Home    Diet: Heart Healthy  , with feeding assistance and aspiration precautions.  For Heart failure patients - Check your Weight same time everyday, if you gain over 2 pounds, or you develop in leg swelling, experience more shortness of breath or chest pain, call your Primary MD immediately. Follow Cardiac Low Salt Diet and 1.5 lit/day fluid restriction.   On your next visit with your primary care physician please Get Medicines reviewed and adjusted.   Please request your Prim.MD to go over all Hospital Tests and Procedure/Radiological results at the follow up, please get all Hospital records sent to your Prim MD by signing hospital release before you go home.   If you experience worsening of your admission symptoms, develop shortness of breath, life threatening emergency, suicidal or homicidal thoughts you must seek medical attention immediately by calling 911 or calling your MD immediately  if symptoms less severe.  You Must read complete instructions/literature along with all the possible adverse reactions/side effects for all the Medicines you take and that have been prescribed to you. Take any new Medicines after you have completely understood and accpet all the possible adverse reactions/side effects.   Do not drive, operating heavy machinery, perform activities at heights, swimming or participation in water activities or provide baby sitting services if your were admitted for syncope or siezures until you have seen by Primary MD or a Neurologist and advised to do so again.  Do not drive when taking Pain medications.    Do not take more than prescribed Pain, Sleep and Anxiety Medications  Special Instructions: If you  have smoked or chewed Tobacco  in the last 2 yrs please stop smoking, stop any regular Alcohol  and or any Recreational drug use.  Wear Seat belts while driving.   Please note  You were cared for by a hospitalist during your hospital stay. If you have any questions about your discharge medications or the care you received while you were in the hospital after you are discharged, you can call the unit and asked to speak with the hospitalist on call if the hospitalist that took care of you is not available. Once you are discharged, your primary care physician will handle any further medical issues. Please note that NO REFILLS for any discharge medications will be authorized once you are discharged, as it is imperative that you return to your primary care physician (or establish a relationship with a primary care physician if you do not have one) for your aftercare needs so that they can reassess your need for medications and monitor your lab values.

## 2015-01-09 NOTE — Care Management Note (Signed)
Case Management Note  Patient Details  Name: Beth [name] MRN: [id number] Date of Birth: 02-12-1924  Subjective/Objective:     Pt admitted with acute on chronic HF               Action/Plan:  Pt states she is independent from home.  CM will assess for HF screen and will continue to monitor for disposition needs.   Expected Discharge Date:                  Expected Discharge Plan:  Home/Self Care  In-House Referral:     Discharge planning Services  CM Consult  Post Acute Care Choice:    Choice offered to:     DME Arranged:    DME Agency:     HH Arranged:    HH Agency:     Status of Service:  In process, will continue to follow  Medicare Important Message Given:  Yes-second notification given Date Medicare IM Given:    Medicare IM give by:    Date Additional Medicare IM Given:    Additional Medicare Important Message give by:     If discussed at [place] of Stay Meetings, dates discussed:    Additional Comments: 01/09/2015 CM contacted PT and requested evaluation.  01/06/15  CM assessed pt.  Pt states she ambulates independently when home.  CM asked if pt thought PT/OT evaluation would be beneficial at this time; pt refused and stated "I get around fine at home".  Pt stated she did not have a scale at home but communicated she could obtain one.  CM informed pt to weigh self daily and to report to MD if she experienced sudden weight gain.  Pt stated she has been on a no sodium diet for a while.   CM will continue to monitor. [name], RN 01/09/2015, 10:27 AM

## 2015-01-09 NOTE — Consult Note (Signed)
Name: Beth [name] MRN: [id number] DOB: [birth year]    ADMISSION DATE:  01/05/2015 CONSULTATION DATE: 01/09/15  REFERRING MD : Hospitalist Service  CHIEF COMPLAINT:  Pleural Effusion  BRIEF PATIENT DESCRIPTION: 81 year old female with known history of pulmonary nodules going back to 2007 declining further workup. Patient also has known history of atrial fibrillation status post pacemaker placement. On admission patient was noted to have increasing lower extremity edema as well as fatigue & dyspnea for approximately 2 weeks. She also was complaining of some right-sided chest pain. Noted to have a moderate-sized right pleural effusion on chest imaging which was not present in July of this year.  SIGNIFICANT EVENTS  10/10 - Admit 10/11 - Right Thoracentesis w/ removal >1L yellow fluid  STUDIES:  TTE (09/26/14): LV normal in size. EF 45-50%. Diffuse hypokinesis with septal bounce. Grade 1 diastolic dysfunction. LA & RA normal in size. RV normal in size and function. Pulmonary artery systolic pressure 66 mmHg. Mild aortic regurgitation without stenosis. Trivial mitral regurgitation without stenosis. Moderate tricuspid regurgitation. No pericardial effusion. IVC dilated suggesting elevated right atrial pressure.  CT CHEST W/ 01/06/15 (personally reviewed by me): Bilateral pleural-based and parenchymal nodules seem to have progressed since 2007 and comparison. Nodules appear partially calcified. No pathologic mediastinal adenopathy. No left pleural effusion. Moderate-sized right pleural effusion with compressive atelectasis and no evidence of consolidation without air bronchograms. No pericardial effusion. No bony abnormalities other than T11 compression deformity.  Portable CXR 01/07/15 (personally reviewed by me): Blunting of the right costal cardiac & costophrenic angle suggesting small residual pleural effusion. No new opacity appreciated. Hyperinflation suggested by flattening of the  diaphragms.  HISTORY OF PRESENT ILLNESS:  81 year old female with known history of bilateral pulmonary nodules deferring biopsy. Per the admission H&P the patient reported approximately 2 weeks of dyspnea with increasing fatigue as well as right-sided chest pain. I the patient today reports that she had difficulty breathing for approximately 3-4 days prior to admission as well as difficulty taking deep breaths and due to a right sided pleuritic pain. She endorses severe fatigue and malaise. She has never expressed pain of this nature before. She reports her dyspnea was significant to the point where she was having trouble with simply talking. She does endorse some subjective chills and sweats but denies any fever. She denies any sore throat. She reports some mild sinus congestion. She did have some nausea but denies any diarrhea. She did have some abdominal pain with her chest discomfort however both her chest discomfort and abdominal pain has significantly improved. She denies any joint swelling, erythema, or stiffness. She denies any myalgias. She denies any rashes or abnormal bruising.  PAST MEDICAL HISTORY :  Past Medical History  Diagnosis Date  . Hypertension   . Lung nodule     CT of chest 2007-left loewr lobar mass- evaluated by pulmonary- patient refused further work up  . Tobacco abuse   . Vertigo   . Abdominal pain, unspecified site 08/17/2012  . Other and unspecified hyperlipidemia 08/17/2012  . Loss of weight 01/19/2013  . Allergic state 06/22/2013  . Allergy   . Osteoporosis   . Syncope     fall  . Increased urinary frequency 01/02/2014  . Glaucoma 07/06/2014  . COPD (chronic obstructive pulmonary disease) ([place])   . Atrial fibrillation with rapid ventricular response ([place])   . Arthritis   . Systolic CHF ([place])     EF 45-50% 09/2014   PAST SURGICAL HISTORY: Past Surgical History  Procedure Laterality Date  . Cholecystectomy    . Cesarean section    . Hernia repair    .  Abdominal hysterectomy    . Ep implantable device N/A 09/26/2014    Procedure: Pacemaker Implant;  Surgeon: Evans Lance, MD;  Location: [facility];  Service: Cardiovascular;  Laterality: N/A;  . Appendectomy    . Hemorrhoid surgery    . Thoracentesis Right 01/07/15    Exudative Effusion Lymphocyte predominant   Prior to Admission medications   Medication Sig Start Date End Date Taking? Authorizing Provider  aspirin 81 MG chewable tablet Chew 1 tablet (81 mg total) by mouth daily. 07/02/13  Yes Orson Eva, MD  calcium-vitamin D (OSCAL WITH D) 500-200 MG-UNIT per tablet Take 1 tablet by mouth daily with breakfast.   Yes Historical Provider, MD  cetirizine (ZYRTEC) 10 MG tablet Take 10 mg by mouth every morning.   Yes Historical Provider, MD  diclofenac (FLECTOR) 1.3 % PTCH Place 1 patch onto the skin daily as needed (pain).   Yes Historical Provider, MD  diltiazem (TIAZAC) 240 MG 24 hr capsule TAKE 1 CAPSULE (240 MG TOTAL) BY MOUTH DAILY. 12/04/14  Yes Mosie Lukes, MD  fish oil-omega-3 fatty acids 1000 MG capsule Take 2 g by mouth daily at 12 noon.    Yes Historical Provider, MD  flecainide (TAMBOCOR) 50 MG tablet Take 1 tablet (50 mg total) by mouth every 12 (twelve) hours. 12/04/14  Yes Mosie Lukes, MD  Multiple Vitamin (MULTIVITAMIN) tablet Take 1 tablet by mouth daily at 12 noon.    Yes Historical Provider, MD  Saline (SODIUM CHLORIDE) 0.65 % SOLN Place 1 spray into the nose as needed for congestion. 2 sprays each nostril once daily as needed   Yes Historical Provider, MD  Wheat Dextrin (BENEFIBER PO) Take 1 Applicatorful by mouth daily.   Yes Historical Provider, MD  furosemide (LASIX) 20 MG tablet Take 1 tablet (20 mg total) by mouth daily as needed for edema. Patient taking differently: Take 20 mg by mouth daily as needed for edema.  11/03/14   Mosie Lukes, MD  metoprolol succinate (TOPROL-XL) 25 MG 24 hr tablet TAKE 1 TABLET (25 MG TOTAL) BY MOUTH DAILY. 12/04/14   Mosie Lukes,  MD   Allergies  Allergen Reactions  . Penicillins Shortness Of Breath, Diarrhea and Other (See Comments)    Stomach cramping  . Ciprofloxacin Other (See Comments)    myalgias  . Fexofenadine Other (See Comments)    unknown    FAMILY HISTORY:  Family History  Problem Relation Age of Onset  . Heart disease Mother   . Hypertension Mother   . Diabetes Mother   . Diabetes    . Cancer      lung- in distant releatives  . Cancer Brother   . Lupus Brother   . Stroke Mother   . Cholelithiasis Father   . Nephrolithiasis Father    SOCIAL HISTORY: Social History   Social History  . Marital Status: Widowed    Spouse Name: N/A  . Number of Children: N/A  . Years of Education: N/A   Social History Main Topics  . Smoking status: Former Smoker -- 0.10 packs/day for 60 years    Types: Cigarettes  . Smokeless tobacco: Never Used     Comment: patient has stopped smoking on September 25, 2014  . Alcohol Use: No  . Drug Use: No  . Sexual Activity: No   Other Topics  Concern  . None   Social History Narrative   [facility]:   Patient is originally from [place]. She is lived in [place],, [place], [place], [place], and [place]. She moved to [place] in 2006. 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 No pet exposure. Remote bird exposure. No mold exposure.   REVIEW OF SYSTEMS: She endorses dry mouth but no dry eyes or oral ulcers. She denies any dysphagia or odynophagia. She denies any Raynaud's phenomenon. She has experienced increased edema as well. A pertinent 14 point review of systems is negative except as per the history of presenting illness.  SUBJECTIVE:   VITAL SIGNS: Temp:  [97.8 F (36.6 C)-98.2 F (36.8 C)] 97.8 F (36.6 C) (10/14 0424) Pulse Rate:  [60-66] 66 (10/14 0944) Resp:  [16-18] 16 (10/14 0424) BP: (122-146)/(43-51) 122/43 mmHg (10/14 0945) SpO2:  [94 %-96 %] 94 % (10/14 0424) Weight:  [96 lb  6.4 oz (43.727 kg)] 96 lb 6.4 oz (43.727 kg) (10/14 0424)  PHYSICAL EXAMINATION: General:  Awake. Alert. No acute distress. Sitting in bed reading the paper.  Integument:  Warm & dry. No rash on exposed skin. No bruising. Band aid present on right posterior thorax from presumed thoracentesis.  Lymphatics:  No appreciated cervical or supraclavicular lymphadenoapthy. HEENT:  Moist mucus membranes. No oral ulcers. No scleral injection or icterus. PERRL. Cardiovascular:  Regular rate. No edema. No appreciable JVD given body positioning.  Pulmonary:  Good aeration bilaterally but with mild basilar crackles. Symmetric chest wall expansion. No accessory muscle use on room air. Abdomen: Soft. Normal bowel sounds. Nondistended. Grossly nontender. Musculoskeletal:  Normal bulk and tone. Hand grip strength 5/5 bilaterally. No joint deformity or effusion appreciated. Neurological:  CN 2-12 grossly in tact. No meningismus. Moving all 4 extremities equally.  Psychiatric:  Mood and affect congruent. Speech normal rhythm, rate & tone.    Recent Labs Lab 01/07/15 0418 01/08/15 0518 01/09/15 0408  NA 136 133* 135  K 3.3* 4.0 3.4*  CL 95* 95* 94*  CO2 29 32 32  BUN '12 13 13  ' CREATININE 0.74 0.71 0.73  GLUCOSE 165* 89 98    Recent Labs Lab 01/05/15 1805 01/06/15 0150 01/06/15 0802  HGB 12.3 11.4* 12.0  HCT 37.3 33.1* 35.9*  WBC 9.5 6.7 5.2  PLT 345 397 390   Dg Chest 1 View  01/07/2015  CLINICAL DATA:  Status post right-sided thoracentesis EXAM: CHEST 1 VIEW COMPARISON:  Chest radiograph January 05, 2015 and January 06, 2015 chest CT FINDINGS: No pneumothorax. Only a small residual right pleural effusion remains. Multiple nodular lesions are noted bilaterally, more on the left than on the right, stable. There is underlying emphysema. No new opacity seen. Heart size and pulmonary vascularity are normal. No adenopathy is apparent. Pacemaker leads are attached to the right atrium and right  ventricle. IMPRESSION: No pneumothorax following thoracentesis with removal of most of the effusion from the right side. Small residual effusion remains on the right. Multiple nodular lesions remain, stable. No new opacity. Underlying emphysema. Pacemaker leads attached to right heart. Electronically Signed   By: Lowella Grip III M.D.   On: 01/07/2015 16:34   US Thoracentesis Asp Pleural Space W/img Guide  01/07/2015  INDICATION: Symptomatic R sided pleural effusion EXAM: US THORACENTESIS ASP PLEURAL SPACE W/IMG GUIDE COMPARISON:  None MEDICATIONS: 10 cc 1% lidocaine COMPLICATIONS: None immediate TECHNIQUE: Informed written consent was obtained from the patient after a discussion of  the risks, benefits and alternatives to treatment. A timeout was performed prior to the initiation of the procedure. Initial ultrasound scanning demonstrates a Right pleural effusion. The lower chest was prepped and draped in the usual sterile fashion. 1% lidocaine was used for local anesthesia. Under direct ultrasound guidance, a 19 gauge, 7-cm, Yueh catheter was introduced. An ultrasound image was saved for documentation purposes. The thoracentesis was performed. The catheter was removed and a dressing was applied. The patient tolerated the procedure well without immediate post procedural complication. The patient was escorted to have an upright chest radiograph. FINDINGS: A total of approximately 1.2 liters of yellow fluid was removed. Requested samples were sent to the laboratory. IMPRESSION: Successful ultrasound-guided R sided thoracentesis yielding 1.2 liters of pleural fluid. Read By:  Lavonia Drafts [facility] Electronically Signed   By: Marybelle Killings M.D.   On: 01/07/2015 16:10   Right PFA (01/06/15) LDH:  133 (serum 208/64%) Total Protein:  5.0 (serum 7.2/69%) WBC:  2032 (Lymph 78%, Eos 3%, Neutro 3%, Mono 16%)  PATHOLOGY Right Pleural Fluid (01/06/15): Reactive mesothelial cells. Lymphocytes  present.  MICROBIOLOGY Right Pleural Fluid (01/06/15):  NGTD  SEROLOGIES ANA (02/17/12):  NEGATIVE  ASSESSMENT / PLAN: 81 year old Caucasian female with a long history of bilateral parenchymal lung nodules. This new right pleural effusion is a concordant exudate with a lymphocyte predominant cell count. There is a family history of lupus which does raise the possibility of an autoimmune etiology to not only the patient's parenchymal nodules but also her exudative pleural effusion. The patient's prior residence within the [place] as well as [place] does raise the possibility for a fungal etiology possibly reactivation as potential cause, but given her nontoxic nature I am somewhat doubtful of this. Certainly a pleural effusion secondary to decompensated systolic congestive heart failure given her prior history of mild systolic dysfunction is a possibility but I would not expect an elevated LDH in this setting. Malignancy is less likely given the patient's current level and LDH as well. However, this must still be considered on the differential given her age and family history of malignancy.  1. Right concordant exudate: Checking serum autoimmune workup. Also checking transthoracic echocardiogram. Plan for repeat chest x-ray PA/LAT after she is evaluated by me in office in follow-up. Recommend continuing to follow pleural fluid cultures to completion. 2. Bilateral pulmonary nodules: Checking serum ANA, ESR, CRP, rheumatoid factor, anti-CCP, double-stranded DNA, Smith antibody, SSA, & SSB. Also checking urine Histoplasma and Blastomyces antigens as well as serum Coccidioides antibodies. Patient declines biopsy at this time. 3. Follow-up: I have given the patient my business card with contact information. Recommend follow-up in clinic within 1-2 weeks.  Sonia Baller Ashok Cordia, M.D. [facility] Pager:  [phone] After 3pm or if no response, call [phone] 01/09/2015, 11:53  AM

## 2015-01-09 NOTE — Care Management Note (Signed)
Case Management Note  Patient Details  Name: Beth [name] MRN: [id number] Date of Birth: 02/20/1924  Subjective/Objective:     Pt admitted with acute on chronic HF               Action/Plan:  Pt states she is independent from home.  CM will assess for HF screen and will continue to monitor for disposition needs.   Expected Discharge Date:                  Expected Discharge Plan:  Home/Self Care  In-House Referral:     Discharge planning Services  CM Consult  Post Acute Care Choice:    Choice offered to:     DME Arranged:    DME Agency:     HH Arranged:    HH Agency:     Status of Service:  In process, will continue to follow  Medicare Important Message Given:  Yes-second notification given Date Medicare IM Given:    Medicare IM give by:    Date Additional Medicare IM Given:    Additional Medicare Important Message give by:     If discussed at [place] of Stay Meetings, dates discussed:    Additional Comments: 01/09/2015 CM contacted PT and requested evaluation.  PT recommending HHPT for pt.  CM spoke with pt; pt is refusing HH at this time.  MD is aware.  01/06/15  CM assessed pt.  Pt states she ambulates independently when home.  CM asked if pt thought PT/OT evaluation would be beneficial at this time; pt refused and stated "I get around fine at home".  Pt stated she did not have a scale at home but communicated she could obtain one.  CM informed pt to weigh self daily and to report to MD if she experienced sudden weight gain.  Pt stated she has been on a no sodium diet for a while.   CM will continue to monitor. [name], RN 01/09/2015, 2:41 PM

## 2015-01-09 NOTE — Discharge Summary (Signed)
Beth [name], is a 81 y.o. female  DOB [birth year]  MRN [id number].  Admission date:  01/05/2015  Admitting Physician  Edwin Dada, MD  Discharge Date:  01/09/2015   Primary MD  Penni Homans, MD  Recommendations for primary care physician for things to follow:  - Please follow on the final results of pleural effusion cultures, as well as rule out immune workup, and 2-D echo results. - Agent to follow with pulmonary in 1-2 weeks from discharge regarding further workup for pulmonary nodules and pleural effusion.   Admission Diagnosis  Hypoxia [R09.02] Congestive heart failure, unspecified congestive heart failure chronicity, unspecified congestive heart failure type ([place]) [I50.9] Atrial fibrillation, unspecified type (HCC) [I48.91]   Discharge Diagnosis  Hypoxia [R09.02] Congestive heart failure, unspecified congestive heart failure chronicity, unspecified congestive heart failure type ([place]) [I50.9] Atrial fibrillation, unspecified type ([place]) [I48.91]    Principal Problem:   Acute on chronic systolic CHF (congestive heart failure) (HCC) Active Problems:   Essential hypertension   Atrial fibrillation (HCC)   Pulmonary nodule   CHF (congestive heart failure) ([place])      Past Medical History  Diagnosis Date  . Hypertension   . Lung nodule     CT of chest 2007-left loewr lobar mass- evaluated by pulmonary- patient refused further work up  . Tobacco abuse   . Vertigo   . Abdominal pain, unspecified site 08/17/2012  . Other and unspecified hyperlipidemia 08/17/2012  . Loss of weight 01/19/2013  . Allergic state 06/22/2013  . Allergy   . Osteoporosis   . Syncope     fall  . Increased urinary frequency 01/02/2014  . Glaucoma 07/06/2014  . COPD (chronic obstructive pulmonary disease) ([place])   . Atrial fibrillation with rapid ventricular response ([place])   . Arthritis   . Systolic CHF  ([place])     EF 45-50% 09/2014    Past Surgical History  Procedure Laterality Date  . Cholecystectomy    . Cesarean section    . Hernia repair    . Abdominal hysterectomy    . Ep implantable device N/A 09/26/2014    Procedure: Pacemaker Implant;  Surgeon: Evans Lance, MD;  Location: [facility];  Service: Cardiovascular;  Laterality: N/A;  . Appendectomy    . Hemorrhoid surgery    . Thoracentesis Right 01/07/15    Exudative Effusion Lymphocyte predominant       History of present illness and  Hospital Course:     Kindly see H&P for history of present illness and admission details, please review complete Labs, Consult reports and Test reports for all details in brief  HPI  from the history and physical done on the day of admission Beth [name] is a 81 y.o. female with history of atrial fibrillation, pacemaker placement for tachybradycardia syndrome, hypertension and lung nodule was referred to the ER by patient's primary care physician as patient was complaining of increasing fatigue and shortness of breath over the last 2 weeks. Patient also was found to  have increasing lower extremity edema. Patient states she has also been having some right-sided chest pain which is gradually improved over the last few days. Patient has been having nonproductive cough denies any fever or chills. In the ER x-ray shows new right-sided pleural effusion and patient was given Lasix and admitted for further management of possible CHF. Patient was admitted in August 2 months ago for chest pain at that time stress Myoview was unremarkable. 2-D echo done showed EF of 45-50%. On exam patient does have elevated JVD. Patient has not been taking her Cozaar.   Hospital Course   Dyspnea - This is related to right pleural effusion, resolved after thoracentesis on 01/07/2015.  Right Pleural effusion/exudate - Status post thoracentesis 01/07/2015, 1.2 L was drained. - Workup significant for exudate as  well blood cell 2032, 78% lymphs, protein ratio > 5, LDH ratio > 0.6, cytology significant for reactive serial cells with lymphocytes, no evidence of malignant cells, cultures no growth to date. - Unclear etiology, might be related to CHF process, possible malignancy as well giving multiple pulmonary nodules on imaging, patient declines biopsy. - 2-D echo done, results pending, can be followed as an outpatient. - Patient to follow final results of ultimate workup and cultures as an outpatient with pulmonary.  Acute on chronic combined diastolic and systolic CHF  - Patient with dyspnea , and pleural effusion , most recent echo in July 2016 showing EF 45-50% with grade 1 diastolic dysfunction . - IV Lasix to by mouth giving her significant diuresis -10,530 since admission, on daily weights, strict ins and outs . Change to by mouth Lasix. we'll discharge on 20 mg oral Lasix , will discharge her on potassium supplements as well . - Continue with metoprolol and losartan . - Follow on the repeat 2-D echo results.  Multiple pulmonary nodules - Progressive in imaging, declined biopsy, autoimmune workup still pending.  Paroxysmal atrial fibrillation  - [place] vasc score of 4, felt not a candidate for anticoagulation secondary to falls  - Heart rate controlled on diltiazem and metoprolol and flecainide .  Hypokalemia - Repleted, .  Hyponatremia - Mild, most likely related to diuresis, and patient having hypotonic fluid intake, will have patient on fluid restriction 1200 mL.  Hypertension  - Continue with home medication   History tachybradycardia syndrome  - Status post permanent pacemaker placement      Discharge Condition:  Stable   Follow UP  Follow-up Information    Follow up with Penni Homans, MD. Schedule an appointment as soon as possible for a visit in 1 week.   Specialty:  Family Medicine   Why:  Posthospitalization follow-up   Contact information:   [address] [address] [phone]       Follow up with Tera Partridge, MD. Call in 1 week.   Specialty:  Pulmonary Disease   Why:  follow-up for pleural effusion and pulmonary nodules   Contact information:   [address] [place] [address] [phone]         Discharge Instructions  and  Discharge Medications         Discharge Instructions    Diet - low sodium heart healthy    Complete by:  As directed      Discharge instructions    Complete by:  As directed   Follow with Primary MD Penni Homans, MD in 7 days  - Follow with pulmonary in 1-2 weeks. Get  CBC, CMP, 2 view Chest X ray checked  by Primary MD next visit.    Activity: As tolerated with Full fall precautions use walker/cane & assistance as needed   Disposition Home    Diet: Heart Healthy  , with feeding assistance and aspiration precautions.  For Heart failure patients - Check your Weight same time everyday, if you gain over 2 pounds, or you develop in leg swelling, experience more shortness of breath or chest pain, call your Primary MD immediately. Follow Cardiac Low Salt Diet and 1.5 lit/day fluid restriction.   On your next visit with your primary care physician please Get Medicines reviewed and adjusted.   Please request your Prim.MD to go over all Hospital Tests and Procedure/Radiological results at the follow up, please get all Hospital records sent to your Prim MD by signing hospital release before you go home.   If you experience worsening of your admission symptoms, develop shortness of breath, life threatening emergency, suicidal or homicidal thoughts you must seek medical attention immediately by calling 911 or calling your MD immediately  if symptoms less severe.  You Must read complete instructions/literature along with all the possible adverse reactions/side effects for all the Medicines you take and that have been prescribed to you. Take any new Medicines  after you have completely understood and accpet all the possible adverse reactions/side effects.   Do not drive, operating heavy machinery, perform activities at heights, swimming or participation in water activities or provide baby sitting services if your were admitted for syncope or siezures until you have seen by Primary MD or a Neurologist and advised to do so again.  Do not drive when taking Pain medications.    Do not take more than prescribed Pain, Sleep and Anxiety Medications  Special Instructions: If you have smoked or chewed Tobacco  in the last 2 yrs please stop smoking, stop any regular Alcohol  and or any Recreational drug use.  Wear Seat belts while driving.   Please note  You were cared for by a hospitalist during your hospital stay. If you have any questions about your discharge medications or the care you received while you were in the hospital after you are discharged, you can call the unit and asked to speak with the hospitalist on call if the hospitalist that took care of you is not available. Once you are discharged, your primary care physician will handle any further medical issues. Please note that NO REFILLS for any discharge medications will be authorized once you are discharged, as it is imperative that you return to your primary care physician (or establish a relationship with a primary care physician if you do not have one) for your aftercare needs so that they can reassess your need for medications and monitor your lab values.     Increase activity slowly    Complete by:  As directed             Medication List    TAKE these medications        aspirin 81 MG chewable tablet  Chew 1 tablet (81 mg total) by mouth daily.     BENEFIBER PO  Take 1 Applicatorful by mouth daily.     calcium-vitamin D 500-200 MG-UNIT tablet  Commonly known as:  OSCAL WITH D  Take 1 tablet by mouth daily with breakfast.     cetirizine 10 MG tablet  Commonly known as:   ZYRTEC  Take 10 mg by mouth every morning.  diltiazem 240 MG 24 hr capsule  Commonly known as:  TIAZAC  TAKE 1 CAPSULE (240 MG TOTAL) BY MOUTH DAILY.     fish oil-omega-3 fatty acids 1000 MG capsule  Take 2 g by mouth daily at 12 noon.     flecainide 50 MG tablet  Commonly known as:  TAMBOCOR  Take 1 tablet (50 mg total) by mouth every 12 (twelve) hours.     FLECTOR 1.3 % Ptch  Generic drug:  diclofenac  Place 1 patch onto the skin daily as needed (pain).     furosemide 20 MG tablet  Commonly known as:  LASIX  Take 1 tablet (20 mg total) by mouth daily.     metoprolol succinate 25 MG 24 hr tablet  Commonly known as:  TOPROL-XL  TAKE 1 TABLET (25 MG TOTAL) BY MOUTH DAILY.     multivitamin tablet  Take 1 tablet by mouth daily at 12 noon.     potassium chloride 10 MEQ tablet  Commonly known as:  K-DUR,KLOR-CON  Take 1 tablet (10 mEq total) by mouth daily.     sodium chloride 0.65 % Soln nasal spray  Commonly known as:  OCEAN  Place 1 spray into the nose as needed for congestion. 2 sprays each nostril once daily as needed          Diet and Activity recommendation: See Discharge Instructions above   Consults obtained -  Pulmonary   Major procedures and Radiology Reports - PLEASE review detailed and final reports for all details, in brief -   - right pleural effusion ultrasound thoracentesis on 01/07/2015  - 2-D echo 01/09/2015   Dg Chest 1 View  01/07/2015  CLINICAL DATA:  Status post right-sided thoracentesis EXAM: CHEST 1 VIEW COMPARISON:  Chest radiograph January 05, 2015 and January 06, 2015 chest CT FINDINGS: No pneumothorax. Only a small residual right pleural effusion remains. Multiple nodular lesions are noted bilaterally, more on the left than on the right, stable. There is underlying emphysema. No new opacity seen. Heart size and pulmonary vascularity are normal. No adenopathy is apparent. Pacemaker leads are attached to the right atrium and right  ventricle. IMPRESSION: No pneumothorax following thoracentesis with removal of most of the effusion from the right side. Small residual effusion remains on the right. Multiple nodular lesions remain, stable. No new opacity. Underlying emphysema. Pacemaker leads attached to right heart. Electronically Signed   By: Lowella Grip III M.D.   On: 01/07/2015 16:34   Dg Chest 2 View  01/05/2015  CLINICAL DATA:  Subacute shortness of breath, cough and weakness for 10 days. EXAM: CHEST  2 VIEW COMPARISON:  11/17/2014 and prior radiograph FINDINGS: Mild cardiomegaly and left-sided pacemaker again noted. A moderate right pleural effusion has increased in size from the prior study, previously minimal. Associated right lower lung atelectasis identified. Left pulmonary nodules and COPD/emphysema again noted. There is no evidence of pneumothorax or acute bony abnormality. A 50% anterior wedge compression fracture of a lower thoracic vertebra is unchanged. IMPRESSION: Moderate right pleural effusion with right lower lung atelectasis. This acute has significantly increased in size since the prior study. COPD/ emphysema, cardiomegaly and left pulmonary nodules again noted. Electronically Signed   By: Margarette Canada M.D.   On: 01/05/2015 18:53   Ct Chest W Contrast  01/06/2015  CLINICAL DATA:  Pleural effusion and pulmonary nodule, recumbent dyspnea, history of skin cancer and pacemaker EXAM: CT CHEST WITH CONTRAST TECHNIQUE: Multidetector CT imaging of the chest was performed  during intravenous contrast administration. CONTRAST:  42mL OMNIPAQUE IOHEXOL 300 MG/ML  SOLN COMPARISON:  08/12/05, 01/05/15 FINDINGS: There is a moderate to large right pleural effusion. There is compressive atelectasis of the right lower lobe. There is no significant hilar or mediastinal adenopathy. There is no pericardial effusion. The thoracic aorta shows no dissection or dilitation, although it ismoderately calcified. There is a two lead cardiac  pacer with generator over the left thorax. Images through the upper abdomen are unremarkable. There is a moderate T 11 compression deformity of about 50% with mild retropulsion causing mild canal narrowing. This is new from 07/01/14. As described on report of numerous prior studies there are multiple left lung nodules. In the left lung apex there is a 27 x 21 mm partially calcified pleural based nodule, increased from 27 x 12 mm on 08/02/2005 CT scan. In the superior segment left lower lobe there is a partially calcified 14 x 15 mm nodule new from 2007. Just inferior to this is a partially calcified 9 mm nodule. Two sub cm nodules are seen just further inferiorly on image 18. In the posterior left lower lobe there is a 13 mm noncalcified nodule. There is a 14 mm noncalcified nodule on image number 31 in the left lower lobe. More anteriorly there is 13 mm nodule on image number 33 and a 7 mm nodule on image number 34 both new from 2007. There is a 22 mm partially calcified nodule on image number 38 in the left lower lobe, which is increased in size from 2007 at which time it measured 18 mm and did not demonstrate calcification. There is a new 8 mm nodule in the anterior left lung base. On the right, on image number 31 there is a 16 mm nodule which previously measured about 4 mm in 2007. There is an indistinct 7 mm nodule on image number 38 at the right lung base. IMPRESSION: 1. Numerous progressive partially calcified lung nodules. Differential diagnostic possibilities include treated as well as untreated various metastatic diseases. TB, histoplasmosis, and pulmonary amyloidosis are other considerations, as would be silicosis or other occupational lung diseases. There is no calcified adenopathy as would be expected with TB, histoplasmosis, or occupational exposure. 2. Right pleural effusion. 3. Acute to subacute T 11 compression deformity, with mild canal narrowing. Electronically Signed   By: Skipper Cliche M.D.    On: 01/06/2015 10:00   US Thoracentesis Asp Pleural Space W/img Guide  01/07/2015  INDICATION: Symptomatic R sided pleural effusion EXAM: US THORACENTESIS ASP PLEURAL SPACE W/IMG GUIDE COMPARISON:  None MEDICATIONS: 10 cc 1% lidocaine COMPLICATIONS: None immediate TECHNIQUE: Informed written consent was obtained from the patient after a discussion of the risks, benefits and alternatives to treatment. A timeout was performed prior to the initiation of the procedure. Initial ultrasound scanning demonstrates a Right pleural effusion. The lower chest was prepped and draped in the usual sterile fashion. 1% lidocaine was used for local anesthesia. Under direct ultrasound guidance, a 19 gauge, 7-cm, Yueh catheter was introduced. An ultrasound image was saved for documentation purposes. The thoracentesis was performed. The catheter was removed and a dressing was applied. The patient tolerated the procedure well without immediate post procedural complication. The patient was escorted to have an upright chest radiograph. FINDINGS: A total of approximately 1.2 liters of yellow fluid was removed. Requested samples were sent to the laboratory. IMPRESSION: Successful ultrasound-guided R sided thoracentesis yielding 1.2 liters of pleural fluid. Read By:  Lavonia Drafts [facility] Electronically Signed  By: Marybelle Killings M.D.   On: 01/07/2015 16:10    Micro Results     Recent Results (from the past 240 hour(s))  Culture, body fluid-bottle     Status: None (Preliminary result)   Collection Time: 01/07/15  3:51 PM  Result Value Ref Range Status   Specimen Description FLUID RIGHT PLEURAL  Final   Special Requests NONE  Final   Culture NO GROWTH 2 DAYS  Final   Report Status PENDING  Incomplete  Gram stain     Status: None   Collection Time: 01/07/15  3:51 PM  Result Value Ref Range Status   Specimen Description FLUID RIGHT PLEURAL  Final   Special Requests NONE  Final   Gram Stain   Final    WBC PRESENT,  PREDOMINANTLY MONONUCLEAR NO ORGANISMS SEEN CYTOSPIN    Report Status 01/07/2015 FINAL  Final       Today   Subjective:   Beth [name] today has no headache,no chest abdominal pain,no new weakness tingling or numbness, feels much better wants to go home today.   Objective:   Blood pressure 118/50, pulse 70, temperature 98.2 F (36.8 C), temperature source Oral, resp. rate 17, height 5' (1.524 m), weight 43.727 kg (96 lb 6.4 oz), SpO2 94 %.   Intake/Output Summary (Last 24 hours) at 01/09/15 1821 Last data filed at 01/09/15 1340  Gross per 24 hour  Intake    480 ml  Output   3000 ml  Net  -2520 ml    Exam  Awake Alert, Oriented X 3, No new F.N deficits, Normal affect [place].AT,PERRAL Supple Neck,No JVD, No cervical lymphadenopathy appriciated.  Symmetrical Chest wall movement, improved air entry in the right, no wheezing No Gallops,Rubs or new Murmurs, No Parasternal Heave +ve B.Sounds, Abd Soft, No tenderness, No organomegaly appriciated, No rebound - guarding or rigidity. No Cyanosis, Clubbing or edema, No new Rash or bruise   Data Review   CBC w Diff:  Lab Results  Component Value Date   WBC 5.2 01/06/2015   HGB 12.0 01/06/2015   HGB 12.7 01/05/2015   HCT 35.9* 01/06/2015   PLT 390 01/06/2015   LYMPHOPCT 11 01/06/2015   MONOPCT 1 01/06/2015   EOSPCT 0 01/06/2015   BASOPCT 0 01/06/2015    CMP:  Lab Results  Component Value Date   NA 135 01/09/2015   K 3.4* 01/09/2015   CL 94* 01/09/2015   CO2 32 01/09/2015   BUN 13 01/09/2015   CREATININE 0.73 01/09/2015   CREATININE 0.57 04/11/2014   PROT 7.2 01/07/2015   ALBUMIN 3.6 01/05/2015   BILITOT 0.7 01/05/2015   ALKPHOS 67 01/05/2015   AST 25 01/05/2015   ALT 13* 01/05/2015  .   Total Time in preparing paper work, data evaluation and todays exam - 35 minutes  Beth [name] M.D on 01/09/2015 at 6:21 PM  [facility]   Office  [phone]

## 2015-01-09 NOTE — Progress Notes (Signed)
  Echocardiogram 2D Echocardiogram has been performed.  Beth [name] 01/09/2015, 4:10 PM

## 2015-01-09 NOTE — Evaluation (Signed)
Physical Therapy Evaluation Patient Details Name: Beth [name] MRN: [id number] DOB: [birth year] Today's Date: 01/09/2015   History of Present Illness  Beth [name] is a 81 y.o. female with history of atrial fibrillation, pacemaker placement for tachybradycardia syndrome, hypertension and lung nodule was referred to the ER by patient's primary care physician as patient was complaining of increasing fatigue and shortness of breath and LE edema over the last 2 weeks.  Xrays show R side pleural effusion.  Thoracentesis 10/12, awaiting cytology report for possible malignancy.  Clinical Impression  Pt admitted with/for SOB due to CHF process.  Pt currently limited functionally due to the problems listed below.  (see problems list.)  Pt will benefit from PT to maximize function and safety to be able to get home safely with available assist of family.     Follow Up Recommendations Home health PT;Supervision - Intermittent    Equipment Recommendations  None recommended by PT    Recommendations for Other Services       Precautions / Restrictions Precautions Precautions: Fall Restrictions Weight Bearing Restrictions: No      Mobility  Bed Mobility Overal bed mobility: Modified Independent                Transfers Overall transfer level: Needs assistance Equipment used: Rolling walker (2 wheeled) Transfers: Sit to/from Stand Sit to Stand: Supervision         General transfer comment: pt stabilized her body against the bed on standing.  Held rails for stability, but no assist needied  Ambulation/Gait Ambulation/Gait assistance: Supervision Ambulation Distance (Feet): 180 Feet Assistive device: Rolling walker (2 wheeled) Gait Pattern/deviations: Step-through pattern   Gait velocity interpretation: at or above normal speed for age/gender General Gait Details: mild unsteadiness that improved with distance.  SpO2 on RA 96% and EHR 93-103 bpm.  Noticable dyspnea, but  mild  Stairs            Wheelchair Mobility    Modified Rankin (Stroke Patients Only)       Balance Overall balance assessment: Needs assistance Sitting-balance support: No upper extremity supported Sitting balance-Leahy Scale: Good     Standing balance support: No upper extremity supported Standing balance-Leahy Scale: Fair                               Pertinent Vitals/Pain Pain Assessment: No/denies pain    Home Living Family/patient expects to be discharged to:: Private residence Living Arrangements: Alone Available Help at Discharge: Family;Available PRN/intermittently Type of Home: Apartment Home Access: Stairs to enter;Ramped entrance Entrance Stairs-Rails: Right Entrance Stairs-Number of Steps:  (flight) Home Layout: One level Home Equipment: Walker - 2 wheels;Walker - 4 wheels;Cane - single point      Prior Function Level of Independence: Independent with assistive device(s)               Hand Dominance        Extremity/Trunk Assessment   Upper Extremity Assessment: Overall WFL for tasks assessed           Lower Extremity Assessment: Overall WFL for tasks assessed (proximal weaknesses R>L LE)      Cervical / Trunk Assessment:  (truncal weakness)  Communication   Communication: No difficulties;HOH  Cognition Arousal/Alertness: Awake/alert Behavior During Therapy: WFL for tasks assessed/performed Overall Cognitive Status: Within Functional Limits for tasks assessed  General Comments      Exercises        Assessment/Plan    PT Assessment Patient needs continued PT services  PT Diagnosis Generalized weakness;Other (comment) (decr activity tolerance)   PT Problem List Decreased strength;Decreased activity tolerance;Decreased balance;Decreased knowledge of use of DME;Cardiopulmonary status limiting activity  PT Treatment Interventions Gait training;Stair training;Functional mobility  training;Therapeutic activities;Patient/family education;Balance training   PT Goals (Current goals can be found in the Care Plan section) Acute Rehab PT Goals Patient Stated Goal: I want to go home PT Goal Formulation: With patient Time For Goal Achievement: 01/16/15 Potential to Achieve Goals: Good    Frequency Min 3X/week   Barriers to discharge        Co-evaluation               End of Session   Activity Tolerance: Patient tolerated treatment well Patient left: in bed;with call bell/phone within reach Nurse Communication: Mobility status         Time: 1020-1103 PT Time Calculation (min) (ACUTE ONLY): 43 min   Charges:   PT Evaluation $Initial PT Evaluation Tier I: 1 Procedure PT Treatments $Gait Training: 8-22 mins $Therapeutic Activity: 8-22 mins   PT G Codes:        Dione Petron, Tessie Fass 01/09/2015, 11:23 AM     01/09/2015  Donnella Sham, PT [phone]  (pager)

## 2015-01-10 LAB — RHEUMATOID FACTOR: Rhuematoid fact SerPl-aCnc: <10 IU/mL (ref 0.0–13.9)

## 2015-01-10 LAB — ANTI-SMITH ANTIBODY: ENA SM Ab Ser-aCnc: <0.2 AI (ref 0.0–0.9)

## 2015-01-10 LAB — SJOGRENS SYNDROME-A EXTRACTABLE NUCLEAR ANTIBODY

## 2015-01-10 LAB — SJOGRENS SYNDROME-B EXTRACTABLE NUCLEAR ANTIBODY

## 2015-01-10 LAB — ANTI-DNA ANTIBODY, DOUBLE-STRANDED: DS DNA AB: 1 [IU]/mL (ref 0–9)

## 2015-01-12 LAB — ANTINUCLEAR ANTIBODIES, IFA: ANTINUCLEAR ANTIBODIES, IFA: NEGATIVE

## 2015-01-12 LAB — CULTURE, BODY FLUID W GRAM STAIN -BOTTLE: Culture: NO GROWTH

## 2015-01-12 LAB — CULTURE, BODY FLUID-BOTTLE

## 2015-01-12 LAB — COCCIDIOIDES ANTIBODIES

## 2015-01-12 LAB — CYCLIC CITRUL PEPTIDE ANTIBODY, IGG/IGA: CCP Antibodies IgG/IgA: 9 units (ref 0–19)

## 2015-01-13 ENCOUNTER — Telehealth: Payer: Self-pay

## 2015-01-13 NOTE — Telephone Encounter (Signed)
Work up negative so far with results review. Only elevated sed rate but very nonspecific

## 2015-01-13 NOTE — Telephone Encounter (Addendum)
  Admission date: 01/05/2015 Admitting Physician Edwin Dada, MD  Discharge Date: 01/09/2015   Primary MD Penni Homans, MD  Recommendations for primary care physician for things to follow:  - Please follow on the final results of pleural effusion cultures, as well as rule out immune workup, and 2-D echo results. - Agent to follow with pulmonary in 1-2 weeks from discharge regarding further workup for pulmonary nodules and pleural effusion.   Hospital follow up appointment scheduled: Pt already had a follow up appt on 01/15/15 at 3:45 pm with Dr. Charlett Blake.  Pt was encouraged to keep appointment as schedule, but encouraged to come in at least 15 mins early if possible.  Pt stated understanding and agreed.   Reason for admission:  CHF, AFib   Transition Care Management Follow-up Telephone Call  How have you been since you were released from the hospital? Pt states she has been feeling much better.  Her only complaint at this time is pain in her back.  Back pain present since she fell x 2-3 months ago.  She says she's not taking anything for pain.  It's hard to stand up straight, but pain is improved.  She denied chest pain, shortness of breath, palpitations, swelling, weight gain, dizziness, excessive weakness or fatigue.     Do you understand why you were in the hospital? yes   Do you understand the discharge instructions? yes  Items Reviewed:  Medications reviewed: yes  Allergies reviewed: yes  Dietary changes reviewed: yes, low sodium diet; which patient states she is following  Referrals reviewed: pulmonary disease   Functional Questionnaire:   Activities of Daily Living (ADLs):   She states they are independent in the following: ambulation, bathing and hygiene, feeding, continence, grooming, toileting and dressing States they require assistance with the following: standby assistance.  She ambulates with walker out in public.  Walks without assistive device  inside of home.    Pt lives in upstairs appt.  She has trouble getting up and down steps.  Pt advised to be careful when ascending or descending steps, have someone present to assist.    Any transportation issues/concerns?: no, daughter provides transportation   Any patient concerns? no, not at time of call   Confirmed importance and date/time of follow-up visits scheduled: yes   Confirmed with patient if condition begins to worsen call PCP or go to the ER: yes

## 2015-01-15 ENCOUNTER — Encounter: Payer: Self-pay | Admitting: Family Medicine

## 2015-01-15 ENCOUNTER — Ambulatory Visit (INDEPENDENT_AMBULATORY_CARE_PROVIDER_SITE_OTHER): Payer: Medicare Other | Admitting: Family Medicine

## 2015-01-15 VITALS — BP 140/70 | HR 89 | Temp 98.0°F | Ht 61.0 in | Wt 103.1 lb

## 2015-01-15 DIAGNOSIS — E43 Unspecified severe protein-calorie malnutrition: Secondary | ICD-10-CM

## 2015-01-15 DIAGNOSIS — I10 Essential (primary) hypertension: Secondary | ICD-10-CM | POA: Diagnosis not present

## 2015-01-15 DIAGNOSIS — I5022 Chronic systolic (congestive) heart failure: Secondary | ICD-10-CM

## 2015-01-15 DIAGNOSIS — I509 Heart failure, unspecified: Secondary | ICD-10-CM

## 2015-01-15 DIAGNOSIS — J90 Pleural effusion, not elsewhere classified: Secondary | ICD-10-CM

## 2015-01-15 MED ORDER — POTASSIUM CHLORIDE CRYS ER 10 MEQ PO TBCR: 10.0000 meq | EXTENDED_RELEASE_TABLET | Freq: Every day | ORAL | Status: DC

## 2015-01-15 MED ORDER — FUROSEMIDE 20 MG PO TABS: 20.0000 mg | ORAL_TABLET | Freq: Every day | ORAL | Status: DC

## 2015-01-15 NOTE — Patient Instructions (Signed)
Pleural Effusion °A pleural effusion is an abnormal buildup of fluid in the layers of tissue between your lungs and the inside of your chest (pleural space). These two layers of tissue that line both your lungs and the inside of your chest are called pleura. Usually, there is no air in the space between the pleura, only a thin layer of fluid. If left untreated, a large amount of fluid can build up and cause the lung to collapse. A pleural effusion is usually caused by another disease that requires treatment. °The two main types of pleural effusion are: °· Transudative pleural effusion. This happens when fluid leaks into the pleural space because of a low protein count in your blood or high blood pressure in your vessels. Heart failure often causes this. °· Exudative infusion. This occurs when fluid collects in the pleural space from blocked blood vessels or lymph vessels. Some lung diseases, injuries, and cancers can cause this type of effusion. °CAUSES °Pleural effusion can be caused by: °· Heart failure. °· A blood clot in the lung (pulmonary embolism). °· Pneumonia. °· Cancer. °· Liver failure (cirrhosis). °· Kidney disease. °· Complications from surgery, such as from open heart surgery. °SIGNS AND SYMPTOMS °In some cases, pleural effusion may cause no symptoms. Symptoms can include: °· Shortness of breath, especially when lying down. °· Chest pain, often worse when taking a deep breath. °· Fever. °· Dry cough that is lasting (chronic). °· Hiccups. °· Rapid breathing. °An underlying condition that is causing the pleural effusion (such as heart failure, pneumonia, blood clots, tuberculosis, or cancer) may also cause additional symptoms. °DIAGNOSIS °Your health care provider may suspect pleural effusion based on your symptoms and medical history. Your health care provider will also do a physical exam and a chest X-ray. If the X-ray shows there is fluid in your chest, you may need to have this fluid removed using a  needle (thoracentesis) so it can be tested. °You may also have: °· Imaging studies of the chest, such as: °¨ Ultrasound. °¨ CT scan. °· Blood tests for kidney and liver function. °TREATMENT °Treatment depends on the cause of the pleural effusion. Treatment may include: °· Taking antibiotic medicines to clear up an infection that is causing the pleural effusion. °· Placing a tube in the chest to drain the effusion (tube thoracostomy). This procedure is often used when there is an infection in the fluid. °· Surgery to remove the fibrous outer layer of tissue from the pleural space (decortication). °· Thoracentesis, which can improve cough and shortness of breath. °· A procedure to put medicine into the chest cavity to seal the pleural space to prevent fluid buildup (pleurodesis). °· Chemotherapy and radiation therapy. These may be required in the case of cancerous (malignant) pleural effusion. °HOME CARE INSTRUCTIONS °· Take medicines only as directed by your health care provider. °· Keep track of how long you can gently exercise before you get short of breath. Try simply walking at first. °· Do not use any tobacco products, including cigarettes, chewing tobacco, or electronic cigarettes. If you need help quitting, ask your health care provider. °· Keep all follow-up visits as directed by your health care provider. This is important. °SEEK MEDICAL CARE IF: °· The amount of time that you are able to exercise decreases or does not improve with time. °· You have pain or signs of infection at the puncture site if you had thoracentesis. Watch for: °¨ Drainage. °¨ Redness. °¨ Swelling. °· You have a fever. °  SEEK IMMEDIATE MEDICAL CARE IF: °· You are short of breath. °· You develop chest pain. °· You develop a new cough. °MAKE SURE YOU: °· Understand these instructions. °· Will watch your condition. °· Will get help right away if you are not doing well or get worse. °  °This information is not intended to replace advice  given to you by your health care provider. Make sure you discuss any questions you have with your health care provider. °  °Document Released: 03/14/2005 Document Revised: 04/04/2014 Document Reviewed: 08/07/2013 °Elsevier Interactive Patient Education ©2016 [employer] ° °

## 2015-01-15 NOTE — Progress Notes (Signed)
Pre visit review using our clinic review tool, if applicable. No additional management support is needed unless otherwise documented below in the visit note. 

## 2015-01-15 NOTE — Assessment & Plan Note (Signed)
Well controlled, no changes to meds. Encouraged heart healthy diet such as the DASH diet and exercise as tolerated.  °

## 2015-01-16 LAB — CBC
HCT: 38.7 % (ref 36.0–46.0)
Hemoglobin: 12.6 g/dL (ref 12.0–15.0)
MCHC: 32.5 g/dL (ref 30.0–36.0)
MCV: 90 fl (ref 78.0–100.0)
Platelets: 330 10*3/uL (ref 150.0–400.0)
RBC: 4.3 Mil/uL (ref 3.87–5.11)
RDW: 13.8 % (ref 11.5–15.5)
WBC: 10 10*3/uL (ref 4.0–10.5)

## 2015-01-16 LAB — SEDIMENTATION RATE: SED RATE: 51 mm/h — AB (ref 0–22)

## 2015-01-16 LAB — COMPREHENSIVE METABOLIC PANEL
ALT: 15 U/L (ref 0–35)
AST: 28 U/L (ref 0–37)
Albumin: 3.8 g/dL (ref 3.5–5.2)
Alkaline Phosphatase: 65 U/L (ref 39–117)
BILIRUBIN TOTAL: 0.4 mg/dL (ref 0.2–1.2)
BUN: 14 mg/dL (ref 6–23)
CALCIUM: 10 mg/dL (ref 8.4–10.5)
CHLORIDE: 99 meq/L (ref 96–112)
CO2: 24 meq/L (ref 19–32)
CREATININE: 0.7 mg/dL (ref 0.40–1.20)
GFR: 83.73 mL/min (ref >60.00)
GLUCOSE: 104 mg/dL — AB (ref 70–99)
Potassium: 4.1 mEq/L (ref 3.5–5.1)
SODIUM: 134 meq/L — AB (ref 135–145)
Total Protein: 7.8 g/dL (ref 6.0–8.3)

## 2015-01-16 LAB — VITAMIN D 25 HYDROXY (VIT D DEFICIENCY, FRACTURES): VITD: 32.73 ng/mL (ref 30.00–100.00)

## 2015-01-16 NOTE — Progress Notes (Signed)
Quick Note:  Notify labs look good except sed rate is still hi. No other concerns ______

## 2015-01-25 NOTE — Assessment & Plan Note (Signed)
Encouraged small, frequent meals with increased lean proteins

## 2015-01-25 NOTE — Assessment & Plan Note (Signed)
Recently hospitalized but doing somewhat better at this time

## 2015-01-25 NOTE — Assessment & Plan Note (Signed)
Now noted to have b/l pleural effusions. Has been referred to pulmonogy for further consideration

## 2015-01-25 NOTE — Progress Notes (Addendum)
Subjective:    Patient ID: Beth [name], female    DOB: [birth year], 81 y.o.   MRN: [id number]  Chief Complaint  Patient presents with  . Hospitalization Follow-up    HPI Patient is in today for hospital follow-up. She was released from the hospital on 01/09/2015 and agreed to follow-up. In the hospital she was diagnosed with congestive heart failure exacerbation as well as pleural effusions and persistent pulmonary nodules. She comes planes of malaise and myalgias. She has worsening fatigability and shortness of breath. Denies CP/palp/SOB/HA/congestion/fevers/GI or GU c/o. Taking meds as prescribed  Past Medical History  Diagnosis Date  . Hypertension   . Lung nodule     CT of chest 2007-left loewr lobar mass- evaluated by pulmonary- patient refused further work up  . Tobacco abuse   . Vertigo   . Abdominal pain, unspecified site 08/17/2012  . Other and unspecified hyperlipidemia 08/17/2012  . Loss of weight 01/19/2013  . Allergic state 06/22/2013  . Allergy   . Osteoporosis   . Syncope     fall  . Increased urinary frequency 01/02/2014  . Glaucoma 07/06/2014  . COPD (chronic obstructive pulmonary disease) ([place])   . Atrial fibrillation with rapid ventricular response ([place])   . Arthritis   . Systolic CHF ([place])     EF 45-50% 09/2014    Past Surgical History  Procedure Laterality Date  . Cholecystectomy    . Cesarean section    . Hernia repair    . Abdominal hysterectomy    . Ep implantable device N/A 09/26/2014    Procedure: Pacemaker Implant;  Surgeon: Evans Lance, MD;  Location: [facility];  Service: Cardiovascular;  Laterality: N/A;  . Appendectomy    . Hemorrhoid surgery    . Thoracentesis Right 01/07/15    Exudative Effusion Lymphocyte predominant    Family History  Problem Relation Age of Onset  . Heart disease Mother   . Hypertension Mother   . Diabetes Mother   . Diabetes    . Cancer      lung- in distant releatives  . Cancer Brother   .  Lupus Brother   . Stroke Mother   . Cholelithiasis Father   . Nephrolithiasis Father     Social History   Social History  . Marital Status: Widowed    Spouse Name: N/A  . Number of Children: N/A  . Years of Education: N/A   Occupational History  . Not on file.   Social History Main Topics  . Smoking status: Former Smoker -- 0.10 packs/day for 60 years    Types: Cigarettes  . Smokeless tobacco: Never Used     Comment: patient has stopped smoking on September 25, 2014  . Alcohol Use: No  . Drug Use: No  . Sexual Activity: No   Other Topics Concern  . Not on file   Social History Narrative   [facility]:   Patient is originally from [place]. She is lived in [place],, [place], [place], [place], and [place]. She moved to [place] in 2006. 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 No pet exposure. Remote bird exposure. No mold exposure.    Outpatient Prescriptions Prior to Visit  Medication Sig Dispense Refill  . aspirin 81 MG chewable tablet Chew 1 tablet (81 mg total) by mouth daily. 30 tablet 11  . calcium-vitamin D (OSCAL WITH D) 500-200 MG-UNIT per tablet Take 1 tablet by mouth  daily with breakfast.    . cetirizine (ZYRTEC) 10 MG tablet Take 10 mg by mouth every morning.    . diltiazem (TIAZAC) 240 MG 24 hr capsule TAKE 1 CAPSULE (240 MG TOTAL) BY MOUTH DAILY. 30 capsule 5  . fish oil-omega-3 fatty acids 1000 MG capsule Take 2 g by mouth daily at 12 noon.     . flecainide (TAMBOCOR) 50 MG tablet Take 1 tablet (50 mg total) by mouth every 12 (twelve) hours. 60 tablet 5  . metoprolol succinate (TOPROL-XL) 25 MG 24 hr tablet TAKE 1 TABLET (25 MG TOTAL) BY MOUTH DAILY. 90 tablet 1  . Multiple Vitamin (MULTIVITAMIN) tablet Take 1 tablet by mouth daily at 12 noon.     . Saline (SODIUM CHLORIDE) 0.65 % SOLN Place 1 spray into the nose as needed for congestion. 2 sprays each nostril once daily as needed    . Wheat  Dextrin (BENEFIBER PO) Take 1 Applicatorful by mouth daily.    . furosemide (LASIX) 20 MG tablet Take 1 tablet (20 mg total) by mouth daily. 30 tablet 0  . potassium chloride (K-DUR,KLOR-CON) 10 MEQ tablet Take 1 tablet (10 mEq total) by mouth daily. 30 tablet 0   No facility-administered medications prior to visit.    Allergies  Allergen Reactions  . Penicillins Shortness Of Breath, Diarrhea and Other (See Comments)    Stomach cramping  . Ciprofloxacin Other (See Comments)    myalgias  . Fexofenadine Other (See Comments)    unknown    Review of Systems  Constitutional: Positive for malaise/fatigue. Negative for fever.  HENT: Negative for congestion.   Eyes: Negative for discharge.  Respiratory: Positive for cough and shortness of breath.   Cardiovascular: Negative for chest pain, palpitations and leg swelling.  Gastrointestinal: Negative for nausea and abdominal pain.  Genitourinary: Negative for dysuria.  Musculoskeletal: Negative for falls.  Skin: Negative for rash.  Neurological: Positive for weakness. Negative for loss of consciousness and headaches.  Endo/Heme/Allergies: Negative for environmental allergies.  Psychiatric/Behavioral: Negative for depression. The patient is not nervous/anxious.        Objective:    Physical Exam  Constitutional: She is oriented to person, place, and time. She appears well-developed and well-nourished. No distress.  HENT:  Head: Normocephalic and atraumatic.  Nose: Nose normal.  Eyes: Right eye exhibits no discharge. Left eye exhibits no discharge.  Neck: Normal range of motion. Neck supple.  Cardiovascular: Normal rate and regular rhythm.   No murmur heard. Pulmonary/Chest: Effort normal and breath sounds normal.  Abdominal: Soft. Bowel sounds are normal. There is no tenderness.  Musculoskeletal: She exhibits no edema.  Neurological: She is alert and oriented to person, place, and time.  Skin: Skin is warm and dry.  Psychiatric:  She has a normal mood and affect.  Nursing note and vitals reviewed.   BP 140/70 mmHg  Pulse 89  Temp(Src) 98 F (36.7 C) (Oral)  Ht _0  (1.549 m)  Wt 103 lb 2 oz (46.777 kg)  BMI 19.50 kg/m2  SpO2 95% Wt Readings from Last 3 Encounters:  01/15/15 103 lb 2 oz (46.777 kg)  01/09/15 96 lb 6.4 oz (43.727 kg)  01/05/15 111 lb 12.8 oz (50.712 kg)     Lab Results  Component Value Date   WBC 10.0 01/15/2015   HGB 12.6 01/15/2015   HCT 38.7 01/15/2015   PLT 330.0 01/15/2015   GLUCOSE 104* 01/15/2015   CHOL 195 08/28/2014   TRIG 56.0 08/28/2014   HDL  66.90 08/28/2014   LDLDIRECT 127.9 10/05/2011   LDLCALC 117* 08/28/2014   ALT 15 01/15/2015   AST 28 01/15/2015   NA 134* 01/15/2015   K 4.1 01/15/2015   CL 99 01/15/2015   CREATININE 0.70 01/15/2015   BUN 14 01/15/2015   CO2 24 01/15/2015   TSH 1.206 01/06/2015   INR 1.27 09/27/2014    Lab Results  Component Value Date   TSH 1.206 01/06/2015   Lab Results  Component Value Date   WBC 10.0 01/15/2015   HGB 12.6 01/15/2015   HCT 38.7 01/15/2015   MCV 90.0 01/15/2015   PLT 330.0 01/15/2015   Lab Results  Component Value Date   NA 134* 01/15/2015   K 4.1 01/15/2015   CO2 24 01/15/2015   GLUCOSE 104* 01/15/2015   BUN 14 01/15/2015   CREATININE 0.70 01/15/2015   BILITOT 0.4 01/15/2015   ALKPHOS 65 01/15/2015   AST 28 01/15/2015   ALT 15 01/15/2015   PROT 7.8 01/15/2015   ALBUMIN 3.8 01/15/2015   CALCIUM 10.0 01/15/2015   ANIONGAP 9 01/09/2015   GFR 83.73 01/15/2015   Lab Results  Component Value Date   CHOL 195 08/28/2014   Lab Results  Component Value Date   HDL 66.90 08/28/2014   Lab Results  Component Value Date   LDLCALC 117* 08/28/2014   Lab Results  Component Value Date   TRIG 56.0 08/28/2014   Lab Results  Component Value Date   CHOLHDL 3 08/28/2014   No results found for: HGBA1C     Assessment & Plan:   Problem List Items Addressed This Visit    Protein-calorie malnutrition,  severe ([place])    Encouraged small, frequent meals with increased lean proteins       Essential hypertension - Primary    Well controlled, no changes to meds. Encouraged heart healthy diet such as the DASH diet and exercise as tolerated.       Relevant Medications   furosemide (LASIX) 20 MG tablet   potassium chloride (K-DUR,KLOR-CON) 10 MEQ tablet   Other Relevant Orders   Vitamin D (25 hydroxy) (Completed)   Sed Rate (ESR) (Completed)   CBC (Completed)   Comp Met (CMET) (Completed)   Chronic systolic heart failure (HCC)    Recently hospitalized but doing somewhat better at this time      Relevant Medications   furosemide (LASIX) 20 MG tablet   CHF (congestive heart failure) (HCC)   Relevant Medications   furosemide (LASIX) 20 MG tablet   potassium chloride (K-DUR,KLOR-CON) 10 MEQ tablet   Other Relevant Orders   Vitamin D (25 hydroxy) (Completed)   Sed Rate (ESR) (Completed)   CBC (Completed)   Comp Met (CMET) (Completed)    Other Visit Diagnoses    Pleural effusion        Relevant Medications    furosemide (LASIX) 20 MG tablet    potassium chloride (K-DUR,KLOR-CON) 10 MEQ tablet    Other Relevant Orders    Vitamin D (25 hydroxy) (Completed)    Sed Rate (ESR) (Completed)    CBC (Completed)    Comp Met (CMET) (Completed)    Hypocalcemia        Relevant Orders    Vitamin D (25 hydroxy) (Completed)       I am having Ms. Rathbone maintain her sodium chloride, multivitamin, fish oil-omega-3 fatty acids, cetirizine, aspirin, calcium-vitamin D, Wheat Dextrin (BENEFIBER PO), metoprolol succinate, flecainide, diltiazem, furosemide, and potassium chloride.  Meds ordered this encounter  Medications  . furosemide (LASIX) 20 MG tablet    Sig: Take 1 tablet (20 mg total) by mouth daily.    Dispense:  30 tablet    Refill:  6  . potassium chloride (K-DUR,KLOR-CON) 10 MEQ tablet    Sig: Take 1 tablet (10 mEq total) by mouth daily.    Dispense:  30 tablet    Refill:  6    Discharge summary and recommendations were reviewed. Reviewed labs and echo as results became available and again with patient. No new recommendations as a result of labs but did repeat some lab work and review.    Penni Homans, MD

## 2015-01-27 ENCOUNTER — Ambulatory Visit (INDEPENDENT_AMBULATORY_CARE_PROVIDER_SITE_OTHER)
Admission: RE | Admit: 2015-01-27 | Discharge: 2015-01-27 | Disposition: A | Discharge status: Home / Self Care | Payer: Medicare Other | Source: Ambulatory Visit | Attending: Pulmonary Disease | Admitting: Pulmonary Disease

## 2015-01-27 ENCOUNTER — Encounter: Payer: Self-pay | Admitting: Pulmonary Disease

## 2015-01-27 ENCOUNTER — Ambulatory Visit (INDEPENDENT_AMBULATORY_CARE_PROVIDER_SITE_OTHER): Payer: Medicare Other | Admitting: Pulmonary Disease

## 2015-01-27 VITALS — BP 132/68 | HR 62 | Wt 104.6 lb

## 2015-01-27 DIAGNOSIS — J90 Pleural effusion, not elsewhere classified: Secondary | ICD-10-CM | POA: Diagnosis not present

## 2015-01-27 DIAGNOSIS — R911 Solitary pulmonary nodule: Secondary | ICD-10-CM

## 2015-01-27 NOTE — Patient Instructions (Addendum)
We'll schedule you for a chest x-ray to evaluate fluid around the lung. Continue to use the Lasix. Weigh yourself and aim for daily weight of less than 105 pounds.   Return to clinic in 6 months

## 2015-01-27 NOTE — Progress Notes (Signed)
Subjective:    Patient ID: Beth [name], female    DOB: 1923-11-10, 81 y.o.   MRN: [id number]  HPI Ms Jenniges is here for follow-up after recent hospitalization for CHF, right pleural effusion. She has a known history of pulmonary nodules dating back to 2007 but has declined further workup or biopsy. She also has history of atrial fibrillation, pacemaker placement. She was admitted on 01/06/15 with lower extremity edema, fatigue, dyspnea. This was thought to be secondary to heart failure. She was also noted to have a unilateral moderate right pleural effusion. Thoracentesis showed an exudative lymphocyte predominant fluid. >1Lt removed. All cultures and cytology and autoimmune workup has been negative so far.  Since discharge he reports feeling well with no shortness of breath, chest pain, palpitations. She's been taking her Lasix 20 mg daily. She does not note any lower extremity edema. She's been measuring her weights daily and they have been constant between 100-105 pounds.   DATA: CXR 01/07/15 No pneumothorax following thoracentesis with removal of most of the effusion from the right side. Small residual effusion remains on the right. Multiple nodular lesions remain, stable. No new opacity. Underlying emphysema. Pacemaker leads attached to right heart.  CT chest 01/06/15 1. Numerous progressive partially calcified lung nodules. Differential diagnostic possibilities include treated as well as untreated various metastatic diseases. TB, histoplasmosis, and pulmonary amyloidosis are other considerations, as would be silicosis or other occupational lung diseases. There is no calcified adenopathy as would be expected with TB, histoplasmosis, or occupational exposure. 2. Right pleural effusion. 3. Acute to subacute T 11 compression deformity, with mild canal Narrowing.  Echo 01/09/15 LVEF 40-45% with basal and mid inferior and inferolateral hypokinesis. Moderate LVH. Grade 1  diastolic dysfunction. PASP 47 mm.  Pleural fluid 01/07/15 WBC 2032, 78% lymphs, LDH 133, total protein 5, pH 8. Cultures-no growth to date Cytology-reactive mesothelial cells, no malignancy.  01/09/15 ANA, CRP, RF, CCP, double-stranded DNA, Smith ab, SSA, SSB, coccidiosis antibody all negative Sedimentation rate 53.  Past Medical History  Diagnosis Date  . Hypertension   . Lung nodule     CT of chest 2007-left loewr lobar mass- evaluated by pulmonary- patient refused further work up  . Tobacco abuse   . Vertigo   . Abdominal pain, unspecified site 08/17/2012  . Other and unspecified hyperlipidemia 08/17/2012  . Loss of weight 01/19/2013  . Allergic state 06/22/2013  . Allergy   . Osteoporosis   . Syncope     fall  . Increased urinary frequency 01/02/2014  . Glaucoma 07/06/2014  . COPD (chronic obstructive pulmonary disease) ([place])   . Atrial fibrillation with rapid ventricular response ([place])   . Arthritis   . Systolic CHF ([place])     EF 45-50% 09/2014    Current outpatient prescriptions:  .  aspirin 81 MG chewable tablet, Chew 1 tablet (81 mg total) by mouth daily., Disp: 30 tablet, Rfl: 11 .  calcium-vitamin D (OSCAL WITH D) 500-200 MG-UNIT per tablet, Take 1 tablet by mouth daily with breakfast., Disp: , Rfl:  .  cetirizine (ZYRTEC) 10 MG tablet, Take 10 mg by mouth every morning., Disp: , Rfl:  .  diltiazem (TIAZAC) 240 MG 24 hr capsule, TAKE 1 CAPSULE (240 MG TOTAL) BY MOUTH DAILY., Disp: 30 capsule, Rfl: 5 .  fish oil-omega-3 fatty acids 1000 MG capsule, Take 2 g by mouth daily at 12 noon. , Disp: , Rfl:  .  flecainide (TAMBOCOR) 50 MG tablet, Take 1 tablet (50 mg  total) by mouth every 12 (twelve) hours., Disp: 60 tablet, Rfl: 5 .  furosemide (LASIX) 20 MG tablet, Take 1 tablet (20 mg total) by mouth daily., Disp: 30 tablet, Rfl: 6 .  metoprolol succinate (TOPROL-XL) 25 MG 24 hr tablet, TAKE 1 TABLET (25 MG TOTAL) BY MOUTH DAILY., Disp: 90 tablet, Rfl: 1 .  Multiple Vitamin  (MULTIVITAMIN) tablet, Take 1 tablet by mouth daily at 12 noon. , Disp: , Rfl:  .  potassium chloride (K-DUR,KLOR-CON) 10 MEQ tablet, Take 1 tablet (10 mEq total) by mouth daily., Disp: 30 tablet, Rfl: 6 .  Saline (SODIUM CHLORIDE) 0.65 % SOLN, Place 1 spray into the nose as needed for congestion. 2 sprays each nostril once daily as needed, Disp: , Rfl:  .  Wheat Dextrin (BENEFIBER PO), Take 1 Applicatorful by mouth daily., Disp: , Rfl:   Review of Systems Denies any cough, sputum production, dyspnea, wheezing. Denies any chest pain, palpitations. Denies any fevers, chills, loss of weight, loss of appetite, malaise, fatigue. Denies any nausea, vomiting, diarrhea, constipation. All other review of systems negative.    Objective:   Physical Exam Blood pressure 132/68, pulse 62, weight 104 lb 9.6 oz (47.446 kg), SpO2 94 %.  Gen.: Pleasant elderly female. No apparent distress Neuro: No gross focal deficits. Neck: No JVD, lymphadenopathy, thyromegaly. RS: Bibasilar crackles, no wheeze, CVS: S1-S2 heard, no murmurs rubs gallops. Abdomen: Soft, positive bowel sounds. Extremities: Trace edema.    Assessment & Plan:  Rt Pleural effusion.  She's had a recent hospitalization with right lymphocytic predominant exudative effusion of unclear etiology. Lymphocytic effusions are most commonly due to malignancy or TB. She has a long history of bilateral parenchymal nodules the last CT scan shows that they're slightly larger. However the pleural fluid cytology is negative and she is not interested in further workup, biopsy. She does not have any risk factors for TB. Autoimmune workup, cultures are negative.   The daughter reports that she fell and hit the right side of her chest about a month before the effusion was discovered. I wonder if the effusion is due to pulmonary contusion , inflammation. It is possible that her effusion is secondary to decompensated congestive heart failure but it is unusual to  have a unilateral exudative effusion.  I have advised her to continue using the Lasix. She is weighing herself daily and her ideal weight appears to be between 100-105 pounds. I'll also get a chest x-ray today to see if the effusion has re accumulated.  Plan: - Repeat CXR today to revaluate the effusion. - Continue using lasix.  Marshell Garfinkel MD [facility] Pager [phone] If no answer or after 3pm call: [phone] 01/27/2015, 3:23 PM

## 2015-03-05 ENCOUNTER — Ambulatory Visit (INDEPENDENT_AMBULATORY_CARE_PROVIDER_SITE_OTHER): Payer: Medicare Other | Admitting: Family Medicine

## 2015-03-05 ENCOUNTER — Ambulatory Visit (HOSPITAL_BASED_OUTPATIENT_CLINIC_OR_DEPARTMENT_OTHER)
Admission: RE | Admit: 2015-03-05 | Discharge: 2015-03-05 | Disposition: A | Discharge status: Home / Self Care | Payer: Medicare Other | Source: Ambulatory Visit | Attending: Family Medicine | Admitting: Family Medicine

## 2015-03-05 ENCOUNTER — Encounter: Payer: Self-pay | Admitting: Family Medicine

## 2015-03-05 VITALS — BP 132/82 | HR 82 | Temp 97.8°F | Ht 61.0 in | Wt 105.4 lb

## 2015-03-05 DIAGNOSIS — R918 Other nonspecific abnormal finding of lung field: Secondary | ICD-10-CM | POA: Diagnosis not present

## 2015-03-05 DIAGNOSIS — R700 Elevated erythrocyte sedimentation rate: Secondary | ICD-10-CM

## 2015-03-05 DIAGNOSIS — I4891 Unspecified atrial fibrillation: Secondary | ICD-10-CM | POA: Diagnosis not present

## 2015-03-05 DIAGNOSIS — J90 Pleural effusion, not elsewhere classified: Secondary | ICD-10-CM

## 2015-03-05 DIAGNOSIS — I5041 Acute combined systolic (congestive) and diastolic (congestive) heart failure: Secondary | ICD-10-CM | POA: Diagnosis not present

## 2015-03-05 DIAGNOSIS — E43 Unspecified severe protein-calorie malnutrition: Secondary | ICD-10-CM

## 2015-03-05 DIAGNOSIS — Z87891 Personal history of nicotine dependence: Secondary | ICD-10-CM | POA: Insufficient documentation

## 2015-03-05 DIAGNOSIS — E871 Hypo-osmolality and hyponatremia: Secondary | ICD-10-CM

## 2015-03-05 DIAGNOSIS — I10 Essential (primary) hypertension: Secondary | ICD-10-CM

## 2015-03-05 DIAGNOSIS — R109 Unspecified abdominal pain: Secondary | ICD-10-CM

## 2015-03-05 DIAGNOSIS — E46 Unspecified protein-calorie malnutrition: Secondary | ICD-10-CM

## 2015-03-05 DIAGNOSIS — R5382 Chronic fatigue, unspecified: Secondary | ICD-10-CM

## 2015-03-05 DIAGNOSIS — I5022 Chronic systolic (congestive) heart failure: Secondary | ICD-10-CM

## 2015-03-05 NOTE — Progress Notes (Signed)
Pre visit review using our clinic review tool, if applicable. No additional management support is needed unless otherwise documented below in the visit note. 

## 2015-03-05 NOTE — Patient Instructions (Signed)
Diet and Congestive Heart Failure Congestive heart failure (CHF) occurs when the heart does not pump as well as it once did. Many diseases lead to CHF, such as high blood pressure and diabetes.  The heart does not have to work as hard when you make some changes in your diet. If you eat too much salt or drink too much fluid, your body's water content may increase and make your heart work harder. This can worsen your CHF. The following diet will help decrease some of your symptoms.  Please consider letting us refer you to a nutritionist for diet education and guidance. It is sometimes very surprising how much salt can be hidden in packaged foods and at restaurants.  Reduce the Salt in Your Diet Even if you crave salt you can learn to like foods that are lower in salt. Your taste buds will change soon, and you will not miss the salt. Removing salt can bring out flavors that may have been hidden by the salt. Reduce the salt content in your diet by trying the following suggestions:  1. Choose plenty of fresh fruits and vegetables. They contain only small amounts of salt. Choose foods that are low in salt, such as fresh meats, poultry, fish, dry and fresh legumes, eggs, milk and yogurt. Plain rice, pasta and oatmeal are good low-sodium choices. However, the sodium content can increase if salt or other high-sodium ingredients are added during their preparation.  2. Season with herbs, spices, herbed vinegar and fruit juices. Avoid herb or spice mixtures that contain salt or sodium. Use lemon juice or fresh ground pepper to accent natural flavors. Try orange or pineapple juice as a base for meat marinades.   3.Read food labels before you buy packaged foods. Check the nutrition facts on the label for sodium content per serving. Find out the number of servings in the package. How does the sodium in each serving compare to the total sodium you can eat each day? Try to pick packaged foods with a sodium content  less than 350 milligrams for each serving. It is also useful to check the list of ingredients. If salt or sodium is listed in the first five ingredients, it is too high in sodium.  When Checking Labels: Use the nutrition information included on packaged foods. Be sure to notice the number of servings per container. Here are tips for using this information.  1. Nutrient List - The list covers nutrients most important to your health.  2.% Daily Value - This number shows how foods meet recommended nutrient intake levels for a 2,000 calorie reference diet. Try to eat no more than 100 percent of total fat, cholesterol and sodium.  3. Daily Values Footnote - Some food labels list daily values for 2,000 and 2,500 calorie daily diets.  4. Calories Per Gram Footnote - Some labels give the approximate number of calories in a gram of fat, carbohydrate and protein.  5.Sodium Content - Always check the sodium content. Look for foods with a sodium content less than 350 milligrams for each serving.  When Cooking or Preparing Food: 1.Shake the habit. Remove the salt shaker from the kitchen counter and table. A 1/8 teaspoon "salt shake" adds more than 250 milligrams of sodium to your dish.  2.Be creative. Instead of adding salt, spark up the flavor with herbs and spices, garlic, onions and citrus juices.  3.Be a low-salt cook. In most recipes, you can cut back on salt by 50 percent or even eliminate it  altogether. You can bake, broil, grill, roast, poach, steam or microwave foods without salt. Skip the urge to add salt to cooking water for pasta, rice, cereal and vegetables. It is an easy way to cut back on sodium.  4.Be careful with condiments. High-sodium condiments include various flavored salts, lemon pepper, garlic salt, onion salt, meat tenderizers, flavor enhancers, bouillon cubes, catsup, mustard, steak sauce and soy sauce.  5.Stay away from hidden salt. Canned and processed foods, such as gravies,  instant cereal, packaged noodles and potato mixes, olives, pickles, soups and vegetables are high in salt. Choose the frozen item instead; or better yet, choose fresh foods when you can. Cheeses, cured meats (such as bacon, bologna, hot dogs and sausages), fast foods and frozen foods also may contain a lot of sodium.  When Eating Out: A low-sodium diet does not need to spoil the pleasure of a restaurant meal. However, you will have to be careful when ordering. Here are some tips for meals away from home:  1.Move the salt shaker to another table. Ask for a lemon wedge or bring your own herb blend to enhance the food's flavor.  2.Recognize menu terms that may indicate a high sodium content: pickled, au jus, soy sauce or in broth.  3.Select raw vegetables or fresh fruit rather than salty snacks.  4.Go easy on condiments such as mustard, catsup, pickles and tartar sauce. Choose lettuce, onions and tomatoes. Remember that bacon and cheeses are high in sodium.  5.Request that the cook prepare foods without adding salt or MSG. Or ask for sauces and salad dressings on the side since they are often high in sodium. For a salad, use a twist of lemon, a splash of vinegar or a light drizzle of dressing

## 2015-03-06 ENCOUNTER — Other Ambulatory Visit: Payer: Self-pay | Admitting: Family Medicine

## 2015-03-06 DIAGNOSIS — J90 Pleural effusion, not elsewhere classified: Secondary | ICD-10-CM

## 2015-03-06 LAB — COMPREHENSIVE METABOLIC PANEL
ALT: 11 U/L (ref 0–35)
AST: 19 U/L (ref 0–37)
Albumin: 4.1 g/dL (ref 3.5–5.2)
Alkaline Phosphatase: 63 U/L (ref 39–117)
BUN: 14 mg/dL (ref 6–23)
CO2: 28 mEq/L (ref 19–32)
Calcium: 10 mg/dL (ref 8.4–10.5)
Chloride: 101 mEq/L (ref 96–112)
Creatinine, Ser: 0.74 mg/dL (ref 0.40–1.20)
GFR: 78.51 mL/min (ref >60.00)
Glucose, Bld: 107 mg/dL — ABNORMAL HIGH (ref 70–99)
Potassium: 5 mEq/L (ref 3.5–5.1)
Sodium: 137 mEq/L (ref 135–145)
Total Bilirubin: 0.5 mg/dL (ref 0.2–1.2)
Total Protein: 7.6 g/dL (ref 6.0–8.3)

## 2015-03-06 LAB — URINALYSIS
Bilirubin Urine: NEGATIVE
Ketones, ur: NEGATIVE
Leukocytes, UA: NEGATIVE
Nitrite: NEGATIVE
Specific Gravity, Urine: 1.01 (ref 1.000–1.030)
Total Protein, Urine: NEGATIVE
Urine Glucose: NEGATIVE
Urobilinogen, UA: 0.2 (ref 0.0–1.0)
pH: 6.5 (ref 5.0–8.0)

## 2015-03-06 LAB — CBC
HCT: 38.3 % (ref 36.0–46.0)
Hemoglobin: 12.8 g/dL (ref 12.0–15.0)
MCHC: 33.5 g/dL (ref 30.0–36.0)
MCV: 89.2 fl (ref 78.0–100.0)
Platelets: 269 10*3/uL (ref 150.0–400.0)
RBC: 4.29 Mil/uL (ref 3.87–5.11)
RDW: 14.8 % (ref 11.5–15.5)
WBC: 7.8 10*3/uL (ref 4.0–10.5)

## 2015-03-06 LAB — URINE CULTURE: Colony Count: 15000

## 2015-03-06 LAB — SEDIMENTATION RATE: Sed Rate: 44 mm/hr — ABNORMAL HIGH (ref 0–22)

## 2015-03-06 LAB — TSH: TSH: 3.6 u[IU]/mL (ref 0.35–4.50)

## 2015-03-08 NOTE — Assessment & Plan Note (Signed)
Well controlled, no changes to meds. Encouraged heart healthy diet such as the DASH diet and exercise as tolerated.  °

## 2015-03-08 NOTE — Assessment & Plan Note (Signed)
Encouraged to minimize sodium intake and offered nutrition referral but patient declines for now. Will call if changes her mind

## 2015-03-08 NOTE — Assessment & Plan Note (Signed)
Rate controlled 

## 2015-03-08 NOTE — Assessment & Plan Note (Signed)
Has improved her protein intake some. wieght has stabilized some. Encouraged the same

## 2015-03-08 NOTE — Progress Notes (Signed)
Subjective:    Patient ID: Beth [name], female    DOB: May 30, 1923, 81 y.o.   MRN: [id number]  Chief Complaint  Patient presents with  . Follow-up    HPI Patient is in today for follow-up. Accompanied by family. Overall feels fairly well. No hospitalizations since her last visit. No chest pain but does occasionally note some tachycardia and palpitations. Has shortness of breath with exertion but it is not worsening. She has increased the protein intake slightly. No new or acute concerns. Denies CP/HA/congestion/fevers/GI or GU c/o. Taking meds as prescribed  Past Medical History  Diagnosis Date  . Hypertension   . Lung nodule     CT of chest 2007-left loewr lobar mass- evaluated by pulmonary- patient refused further work up  . Tobacco abuse   . Vertigo   . Abdominal pain, unspecified site 08/17/2012  . Other and unspecified hyperlipidemia 08/17/2012  . Loss of weight 01/19/2013  . Allergic state 06/22/2013  . Allergy   . Osteoporosis   . Syncope     fall  . Increased urinary frequency 01/02/2014  . Glaucoma 07/06/2014  . COPD (chronic obstructive pulmonary disease) ([place])   . Atrial fibrillation with rapid ventricular response ([place])   . Arthritis   . Systolic CHF ([place])     EF 45-50% 09/2014    Past Surgical History  Procedure Laterality Date  . Cholecystectomy    . Cesarean section    . Hernia repair    . Abdominal hysterectomy    . Ep implantable device N/A 09/26/2014    Procedure: Pacemaker Implant;  Surgeon: Evans Lance, MD;  Location: [facility];  Service: Cardiovascular;  Laterality: N/A;  . Appendectomy    . Hemorrhoid surgery    . Thoracentesis Right 01/07/15    Exudative Effusion Lymphocyte predominant    Family History  Problem Relation Age of Onset  . Heart disease Mother   . Hypertension Mother   . Diabetes Mother   . Diabetes    . Cancer      lung- in distant releatives  . Cancer Brother   . Lupus Brother   . Stroke Mother   .  Cholelithiasis Father   . Nephrolithiasis Father     Social History   Social History  . Marital Status: Widowed    Spouse Name: N/A  . Number of Children: N/A  . Years of Education: N/A   Occupational History  . Not on file.   Social History Main Topics  . Smoking status: Former Smoker -- 0.10 packs/day for 60 years    Types: Cigarettes  . Smokeless tobacco: Never Used     Comment: patient has stopped smoking on September 25, 2014  . Alcohol Use: No  . Drug Use: No  . Sexual Activity: No   Other Topics Concern  . Not on file   Social History Narrative   [facility]:   Patient is originally from [place]. She is lived in [place],, [place], [place], [place], and [place]. She moved to [place] in 2006. 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 No pet exposure. Remote bird exposure. No mold exposure.    Outpatient Prescriptions Prior to Visit  Medication Sig Dispense Refill  . aspirin 81 MG chewable tablet Chew 1 tablet (81 mg total) by mouth daily. 30 tablet 11  . calcium-vitamin D (OSCAL WITH D) 500-200 MG-UNIT per tablet Take 1 tablet by mouth daily with  breakfast.    . cetirizine (ZYRTEC) 10 MG tablet Take 10 mg by mouth every morning.    . diltiazem (TIAZAC) 240 MG 24 hr capsule TAKE 1 CAPSULE (240 MG TOTAL) BY MOUTH DAILY. 30 capsule 5  . fish oil-omega-3 fatty acids 1000 MG capsule Take 2 g by mouth daily at 12 noon.     . flecainide (TAMBOCOR) 50 MG tablet Take 1 tablet (50 mg total) by mouth every 12 (twelve) hours. 60 tablet 5  . furosemide (LASIX) 20 MG tablet Take 1 tablet (20 mg total) by mouth daily. 30 tablet 6  . metoprolol succinate (TOPROL-XL) 25 MG 24 hr tablet TAKE 1 TABLET (25 MG TOTAL) BY MOUTH DAILY. 90 tablet 1  . Multiple Vitamin (MULTIVITAMIN) tablet Take 1 tablet by mouth daily at 12 noon.     . potassium chloride (K-DUR,KLOR-CON) 10 MEQ tablet Take 1 tablet (10 mEq total) by mouth  daily. 30 tablet 6  . Saline (SODIUM CHLORIDE) 0.65 % SOLN Place 1 spray into the nose as needed for congestion. 2 sprays each nostril once daily as needed    . Wheat Dextrin (BENEFIBER PO) Take 1 Applicatorful by mouth daily.     No facility-administered medications prior to visit.    Allergies  Allergen Reactions  . Penicillins Shortness Of Breath, Diarrhea and Other (See Comments)    Stomach cramping  . Ciprofloxacin Other (See Comments)    myalgias  . Fexofenadine Other (See Comments)    unknown    Review of Systems  Constitutional: Positive for malaise/fatigue. Negative for fever.  HENT: Negative for congestion.   Eyes: Negative for discharge.  Respiratory: Positive for shortness of breath.   Cardiovascular: Negative for chest pain, palpitations and leg swelling.  Gastrointestinal: Negative for nausea and abdominal pain.  Genitourinary: Negative for dysuria.  Musculoskeletal: Negative for falls.  Skin: Negative for rash.  Neurological: Negative for loss of consciousness and headaches.  Endo/Heme/Allergies: Negative for environmental allergies.  Psychiatric/Behavioral: Negative for depression. The patient is not nervous/anxious.        Objective:    Physical Exam  Constitutional: She is oriented to person, place, and time. She appears well-developed and well-nourished. No distress.  frail  HENT:  Head: Normocephalic and atraumatic.  Nose: Nose normal.  Eyes: Right eye exhibits no discharge. Left eye exhibits no discharge.  Neck: Normal range of motion. Neck supple.  Cardiovascular: Normal rate.   irregular  Pulmonary/Chest: Effort normal and breath sounds normal.  Abdominal: Soft. Bowel sounds are normal. There is no tenderness.  Musculoskeletal: She exhibits no edema.  Neurological: She is alert and oriented to person, place, and time.  Skin: Skin is warm and dry.  Psychiatric: She has a normal mood and affect.  Nursing note and vitals reviewed.   BP 132/82  mmHg  Pulse 82  Temp(Src) 97.8 F (36.6 C) (Oral)  Ht '5\' 1"'  (1.549 m)  Wt 105 lb 6 oz (47.798 kg)  BMI 19.92 kg/m2  SpO2 95% Wt Readings from Last 3 Encounters:  03/05/15 105 lb 6 oz (47.798 kg)  01/27/15 104 lb 9.6 oz (47.446 kg)  01/15/15 103 lb 2 oz (46.777 kg)     Lab Results  Component Value Date   WBC 7.8 03/05/2015   HGB 12.8 03/05/2015   HCT 38.3 03/05/2015   PLT 269.0 03/05/2015   GLUCOSE 107* 03/05/2015   CHOL 195 08/28/2014   TRIG 56.0 08/28/2014   HDL 66.90 08/28/2014   LDLDIRECT 127.9 10/05/2011  LDLCALC 117* 08/28/2014   ALT 11 03/05/2015   AST 19 03/05/2015   NA 137 03/05/2015   K 5.0 03/05/2015   CL 101 03/05/2015   CREATININE 0.74 03/05/2015   BUN 14 03/05/2015   CO2 28 03/05/2015   TSH 3.60 03/05/2015   INR 1.27 09/27/2014    Lab Results  Component Value Date   TSH 3.60 03/05/2015   Lab Results  Component Value Date   WBC 7.8 03/05/2015   HGB 12.8 03/05/2015   HCT 38.3 03/05/2015   MCV 89.2 03/05/2015   PLT 269.0 03/05/2015   Lab Results  Component Value Date   NA 137 03/05/2015   K 5.0 03/05/2015   CO2 28 03/05/2015   GLUCOSE 107* 03/05/2015   BUN 14 03/05/2015   CREATININE 0.74 03/05/2015   BILITOT 0.5 03/05/2015   ALKPHOS 63 03/05/2015   AST 19 03/05/2015   ALT 11 03/05/2015   PROT 7.6 03/05/2015   ALBUMIN 4.1 03/05/2015   CALCIUM 10.0 03/05/2015   ANIONGAP 9 01/09/2015   GFR 78.51 03/05/2015   Lab Results  Component Value Date   CHOL 195 08/28/2014   Lab Results  Component Value Date   HDL 66.90 08/28/2014   Lab Results  Component Value Date   LDLCALC 117* 08/28/2014   Lab Results  Component Value Date   TRIG 56.0 08/28/2014   Lab Results  Component Value Date   CHOLHDL 3 08/28/2014   No results found for: HGBA1C     Assessment & Plan:   Problem List Items Addressed This Visit    Atrial fibrillation ([place]) - Primary    Rate controlled      Relevant Orders   TSH (Completed)   CBC (Completed)    Comprehensive metabolic panel (Completed)   Sed Rate (ESR) (Completed)   CHF (congestive heart failure) ([place])   Relevant Orders   TSH (Completed)   CBC (Completed)   Comprehensive metabolic panel (Completed)   Sed Rate (ESR) (Completed)   Chronic systolic heart failure ([facility])    Encouraged to minimize sodium intake and offered nutrition referral but patient declines for now. Will call if changes her mind      Essential hypertension    Well controlled, no changes to meds. Encouraged heart healthy diet such as the DASH diet and exercise as tolerated.       Fatigue   Protein-calorie malnutrition, severe ([place])    Has improved her protein intake some. wieght has stabilized some. Encouraged the same       Other Visit Diagnoses    Protein deficiency ([place])        Relevant Orders    TSH (Completed)    CBC (Completed)    Comprehensive metabolic panel (Completed)    Sed Rate (ESR) (Completed)    Elevated sed rate        Relevant Orders    TSH (Completed)    CBC (Completed)    Comprehensive metabolic panel (Completed)    Sed Rate (ESR) (Completed)    Urinalysis (Completed)    Urine culture (Completed)    Pleural effusion        Relevant Orders    DG Chest 2 View (Completed)    Hyponatremia        Relevant Orders    Urinalysis (Completed)    Urine culture (Completed)    Abdominal pain, unspecified abdominal location        Relevant Orders    Urinalysis (Completed)    Urine  culture (Completed)       I am having Ms. Norbeck maintain her sodium chloride, multivitamin, fish oil-omega-3 fatty acids, cetirizine, aspirin, calcium-vitamin D, Wheat Dextrin (BENEFIBER PO), metoprolol succinate, flecainide, diltiazem, furosemide, and potassium chloride.  No orders of the defined types were placed in this encounter.     Penni Homans, MD

## 2015-03-09 ENCOUNTER — Encounter: Payer: Self-pay | Admitting: Family Medicine

## 2015-03-10 ENCOUNTER — Other Ambulatory Visit: Payer: Self-pay | Admitting: Family Medicine

## 2015-03-18 DIAGNOSIS — H40033 Anatomical narrow angle, bilateral: Secondary | ICD-10-CM | POA: Diagnosis not present

## 2015-03-18 DIAGNOSIS — H01003 Unspecified blepharitis right eye, unspecified eyelid: Secondary | ICD-10-CM | POA: Diagnosis not present

## 2015-03-18 DIAGNOSIS — H16143 Punctate keratitis, bilateral: Secondary | ICD-10-CM | POA: Diagnosis not present

## 2015-03-18 DIAGNOSIS — H25013 Cortical age-related cataract, bilateral: Secondary | ICD-10-CM | POA: Diagnosis not present

## 2015-03-18 DIAGNOSIS — H2513 Age-related nuclear cataract, bilateral: Secondary | ICD-10-CM | POA: Diagnosis not present

## 2015-03-24 ENCOUNTER — Ambulatory Visit (INDEPENDENT_AMBULATORY_CARE_PROVIDER_SITE_OTHER): Payer: Medicare Other | Admitting: *Deleted

## 2015-03-24 DIAGNOSIS — I495 Sick sinus syndrome: Secondary | ICD-10-CM | POA: Diagnosis not present

## 2015-03-25 NOTE — Progress Notes (Signed)
Remote pacemaker transmission.   

## 2015-03-26 ENCOUNTER — Encounter: Payer: Self-pay | Admitting: Cardiology

## 2015-03-26 LAB — CUP PACEART REMOTE DEVICE CHECK
Battery Remaining Longevity: 153 mo
Brady Statistic AP VP Percent: 0 %
Brady Statistic AP VS Percent: 82 %
Brady Statistic AS VP Percent: 0 %
Brady Statistic AS VS Percent: 18 %
Date Time Interrogation Session: 20161225190129
Implantable Lead Implant Date: 20160701
Implantable Lead Location: 753860
Implantable Lead Model: 5076
Lead Channel Impedance Value: 533 Ohm
Lead Channel Pacing Threshold Amplitude: 0.75 V
Lead Channel Pacing Threshold Pulse Width: 0.4 ms
Lead Channel Pacing Threshold Pulse Width: 0.4 ms
Lead Channel Sensing Intrinsic Amplitude: 8 mV
Lead Channel Setting Sensing Sensitivity: 4 mV
MDC IDC LEAD IMPLANT DT: 20160701
MDC IDC LEAD LOCATION: 753859
MDC IDC MSMT BATTERY IMPEDANCE: 100 Ohm
MDC IDC MSMT BATTERY VOLTAGE: 2.79 V
MDC IDC MSMT LEADCHNL RA IMPEDANCE VALUE: 488 Ohm
MDC IDC MSMT LEADCHNL RA PACING THRESHOLD AMPLITUDE: 0.625 V
MDC IDC SET LEADCHNL RA PACING AMPLITUDE: 1.5 V
MDC IDC SET LEADCHNL RV PACING AMPLITUDE: 2.5 V
MDC IDC SET LEADCHNL RV PACING PULSEWIDTH: 0.4 ms

## 2015-04-06 ENCOUNTER — Other Ambulatory Visit: Payer: Self-pay | Admitting: Family Medicine

## 2015-04-10 ENCOUNTER — Encounter: Payer: Self-pay | Admitting: Pulmonary Disease

## 2015-04-10 ENCOUNTER — Ambulatory Visit (INDEPENDENT_AMBULATORY_CARE_PROVIDER_SITE_OTHER): Payer: Medicare Other | Admitting: Pulmonary Disease

## 2015-04-10 VITALS — BP 122/60 | HR 63 | Ht 61.0 in | Wt 109.0 lb

## 2015-04-10 DIAGNOSIS — R911 Solitary pulmonary nodule: Secondary | ICD-10-CM | POA: Diagnosis not present

## 2015-04-10 NOTE — Progress Notes (Signed)
Subjective:    Patient ID: Beth [name], female    DOB: 1924/01/28, 82 y.o.   MRN: [id number]  PROBLEM LIST: CHF Right pleural effusion- Exudative lymphocyte predominant. Pulmonary nodules  HPI Beth [name] is here for follow-up of pleural effusion. She has a known history of pulmonary nodules dating back to 2007 but has declined further workup or biopsy. She also has history of atrial fibrillation, pacemaker placement. She was admitted on 01/06/15 with lower extremity edema, fatigue, dyspnea. This was thought to be secondary to heart failure. She was also noted to have a unilateral moderate right pleural effusion. Thoracentesis showed an exudative lymphocyte predominant fluid. >1Lt removed. All cultures and cytology and autoimmune workup has been negative so far.  Since discharge he reports feeling well with no shortness of breath, chest pain, palpitations. She's been taking her Lasix 20 mg daily. She does not note any lower extremity edema. She's been measuring her weights daily and they have been constant between 100-105 pounds.  Interim history: She was seen by her primary care physician in December when swas complaining of increasing fatigue. She does not have any symptoms related to her breathing. She denies any cough, increased dyspnea, wheezing, sputum production, fevers, chills. A chest x-ray done at that time is read as increased moderate right pleural effusion.  DATA: CXR 01/07/15 No pneumothorax following thoracentesis with removal of most of the effusion from the right side. Small residual effusion remains on the right. Multiple nodular lesions remain, stable. No new opacity. Underlying emphysema. Pacemaker leads attached to right heart.  CT chest 01/06/15 1. Numerous progressive partially calcified lung nodules. Differential diagnostic possibilities include treated as well as untreated various metastatic diseases. TB, histoplasmosis, and pulmonary amyloidosis are  other considerations, as would be silicosis or other occupational lung diseases. There is no calcified adenopathy as would be expected with TB, histoplasmosis, or occupational exposure. 2. Right pleural effusion. 3. Acute to subacute T 11 compression deformity, with mild canal Narrowing.  Echo 01/09/15 LVEF 40-45% with basal and mid inferior and inferolateral hypokinesis. Moderate LVH. Grade 1 diastolic dysfunction. PASP 47 mm.  Pleural fluid 01/07/15 WBC 2032, 78% lymphs, LDH 133, total protein 5, pH 8. Cultures-no growth to date Cytology-reactive mesothelial cells, no malignancy.  01/09/15 ANA, CRP, RF, CCP, double-stranded DNA, Smith ab, SSA, SSB, coccidiosis antibody all negative Sedimentation rate 53.  Past Medical History  Diagnosis Date  . Hypertension   . Lung nodule     CT of chest 2007-left loewr lobar mass- evaluated by pulmonary- patient refused further work up  . Tobacco abuse   . Vertigo   . Abdominal pain, unspecified site 08/17/2012  . Other and unspecified hyperlipidemia 08/17/2012  . Loss of weight 01/19/2013  . Allergic state 06/22/2013  . Allergy   . Osteoporosis   . Syncope     fall  . Increased urinary frequency 01/02/2014  . Glaucoma 07/06/2014  . COPD (chronic obstructive pulmonary disease) ([place])   . Atrial fibrillation with rapid ventricular response ([place])   . Arthritis   . Systolic CHF ([place])     EF 45-50% 09/2014    Current outpatient prescriptions:  .  aspirin 81 MG chewable tablet, Chew 1 tablet (81 mg total) by mouth daily., Disp: 30 tablet, Rfl: 11 .  calcium-vitamin D (OSCAL WITH D) 500-200 MG-UNIT per tablet, Take 1 tablet by mouth daily with breakfast., Disp: , Rfl:  .  cetirizine (ZYRTEC) 10 MG tablet, Take 10 mg by mouth every morning., Disp: ,  Rfl:  .  diltiazem (TIAZAC) 240 MG 24 hr capsule, TAKE 1 CAPSULE (240 MG TOTAL) BY MOUTH DAILY., Disp: 30 capsule, Rfl: 5 .  fish oil-omega-3 fatty acids 1000 MG capsule, Take 2 g by mouth daily at  12 noon. , Disp: , Rfl:  .  flecainide (TAMBOCOR) 50 MG tablet, Take 1 tablet (50 mg total) by mouth every 12 (twelve) hours., Disp: 60 tablet, Rfl: 5 .  furosemide (LASIX) 20 MG tablet, TAKE 1 TABLET (20 MG TOTAL) BY MOUTH DAILY AS NEEDED FOR EDEMA., Disp: 20 tablet, Rfl: 1 .  metoprolol succinate (TOPROL-XL) 25 MG 24 hr tablet, TAKE 1 TABLET (25 MG TOTAL) BY MOUTH DAILY., Disp: 90 tablet, Rfl: 1 .  Multiple Vitamin (MULTIVITAMIN) tablet, Take 1 tablet by mouth daily at 12 noon. , Disp: , Rfl:  .  potassium chloride (K-DUR,KLOR-CON) 10 MEQ tablet, Take 1 tablet (10 mEq total) by mouth daily., Disp: 30 tablet, Rfl: 6 .  Saline (SODIUM CHLORIDE) 0.65 % SOLN, Place 1 spray into the nose as needed for congestion. 2 sprays each nostril once daily as needed, Disp: , Rfl:  .  Wheat Dextrin (BENEFIBER PO), Take 1 Applicatorful by mouth daily., Disp: , Rfl:   Review of Systems Denies any cough, sputum production, dyspnea, wheezing. Denies any chest pain, palpitations. Denies any fevers, chills, loss of weight, loss of appetite, malaise, fatigue. Denies any nausea, vomiting, diarrhea, constipation. All other review of systems negative.    Objective:   Physical Exam Blood pressure 122/60, pulse 63, height 5\' 1"  (1.549 m), weight 109 lb (49.442 kg), SpO2 94 %.  Gen.: Pleasant elderly female. No apparent distress Neuro: No gross focal deficits. Neck: No JVD, lymphadenopathy, thyromegaly. RS: Bibasilar crackles, no wheeze, CVS: S1-S2 heard, no murmurs rubs gallops. Abdomen: Soft, positive bowel sounds. Extremities: Trace edema.    Assessment & Plan:  Rt Pleural effusion.  She's had a recent hospitalization with right lymphocytic predominant exudative effusion of unclear etiology. Lymphocytic effusions are most commonly due to malignancy or TB. She has a long history of bilateral parenchymal nodules the last CT scan shows that they're slightly larger. However the pleural fluid cytology is negative  and she is not interested in further workup, biopsy. She does not have any risk factors for TB. Autoimmune workup, cultures are negative.    The daughter reports that she fell and hit the right side of her chest about a month before the effusion was discovered. I wonder if the effusion is due to pulmonary contusion , inflammation. It is possible that her effusion is secondary to decompensated congestive heart failure but it is unusual to have a unilateral exudative effusion.  I reviewed her repeat x-ray in December 16. Contrary to the radiologist reading I do not believe that her effusion is much larger than before. It is still minimal and is not amenable to thoracentesis. She does not have any increased respiratory symptoms. I will continue to observe her with a repeat chest x-ray later this year. She'll need close attention to her diuresis. I have advised her to continue using the Lasix. She is weighing herself daily and her ideal weight appears to be between 100-105 pounds.   Plan: - Continue to monitor effusion. No indication for repeat thoracentesis - Continue using lasix.  Marshell Garfinkel MD [facility] Pager [phone] If no answer or after 3pm call: [phone] 04/10/2015, 2:26 PM

## 2015-04-27 ENCOUNTER — Telehealth: Payer: Self-pay | Admitting: Cardiology

## 2015-04-27 NOTE — Telephone Encounter (Signed)
Informed EC (daughter) that pt's remote transmission was received. Lead measurements were stable and no episodes were recorded.   I encouraged EC to have patient f/u with her PCP if discomfort continues. EC voiced understanding.

## 2015-04-27 NOTE — Telephone Encounter (Signed)
Pt daughter called and stated that pt had felt some pain on her left side shoulder / and back area. Informed pt daughter that transmission was received and that someone would look at it and call her back. She verbalized understanding.

## 2015-04-27 NOTE — Telephone Encounter (Signed)
LMTCB//sss 

## 2015-04-27 NOTE — Telephone Encounter (Signed)
F/u ° ° ° °Pt's daughter returning your call °

## 2015-06-11 ENCOUNTER — Ambulatory Visit (INDEPENDENT_AMBULATORY_CARE_PROVIDER_SITE_OTHER): Payer: Medicare Other | Admitting: Family Medicine

## 2015-06-11 ENCOUNTER — Encounter: Payer: Self-pay | Admitting: Family Medicine

## 2015-06-11 VITALS — HR 89 | Temp 97.6°F | Ht 61.0 in | Wt 107.5 lb

## 2015-06-11 DIAGNOSIS — E785 Hyperlipidemia, unspecified: Secondary | ICD-10-CM

## 2015-06-11 DIAGNOSIS — Z23 Encounter for immunization: Secondary | ICD-10-CM

## 2015-06-11 DIAGNOSIS — I10 Essential (primary) hypertension: Secondary | ICD-10-CM | POA: Diagnosis not present

## 2015-06-11 DIAGNOSIS — R109 Unspecified abdominal pain: Secondary | ICD-10-CM

## 2015-06-11 DIAGNOSIS — J90 Pleural effusion, not elsewhere classified: Secondary | ICD-10-CM

## 2015-06-11 DIAGNOSIS — I509 Heart failure, unspecified: Secondary | ICD-10-CM | POA: Diagnosis not present

## 2015-06-11 MED ORDER — POTASSIUM CHLORIDE CRYS ER 10 MEQ PO TBCR: 10.0000 meq | EXTENDED_RELEASE_TABLET | Freq: Every day | ORAL | Status: DC

## 2015-06-11 MED ORDER — FLECAINIDE ACETATE 50 MG PO TABS: 50.0000 mg | ORAL_TABLET | Freq: Two times a day (BID) | ORAL | Status: DC

## 2015-06-11 MED ORDER — FUROSEMIDE 20 MG PO TABS: ORAL_TABLET | ORAL | Status: DC

## 2015-06-11 MED ORDER — DILTIAZEM HCL ER BEADS 240 MG PO CP24: ORAL_CAPSULE | ORAL | Status: DC

## 2015-06-11 MED ORDER — METOPROLOL SUCCINATE ER 25 MG PO TB24: ORAL_TABLET | ORAL | Status: DC

## 2015-06-11 NOTE — Assessment & Plan Note (Signed)
Well controlled, no changes to meds. Encouraged heart healthy diet such as the DASH diet and exercise as tolerated.  °

## 2015-06-11 NOTE — Assessment & Plan Note (Signed)
Minimize sodium and report worsening symptoms

## 2015-06-11 NOTE — Progress Notes (Signed)
Subjective:    Patient ID: Beth [name], female    DOB: July 18, 1923, 82 y.o.   MRN: [id number]  Chief Complaint  Patient presents with  . Follow-up    HPI Patient is in today for follow up with concerns with bloating in stomach, patient states she has been having gas and has been ongoing for the past year  Patient states she does not get hungry but she forces herself and once she starts eating she is able to eat. Patient having some soreness around the pacemaker.  Hurts the muscles around the arm of the pace maker associated with the arm and underarm. Denies CP/palp/SOB/HA/congestion/fevers/GI or GU c/o. Taking meds as prescribed   Past Medical History  Diagnosis Date  . Hypertension   . Lung nodule     CT of chest 2007-left loewr lobar mass- evaluated by pulmonary- patient refused further work up  . Tobacco abuse   . Vertigo   . Abdominal pain, unspecified site 08/17/2012  . Other and unspecified hyperlipidemia 08/17/2012  . Loss of weight 01/19/2013  . Allergic state 06/22/2013  . Allergy   . Osteoporosis   . Syncope     fall  . Increased urinary frequency 01/02/2014  . Glaucoma 07/06/2014  . COPD (chronic obstructive pulmonary disease) ([place])   . Atrial fibrillation with rapid ventricular response ([place])   . Arthritis   . Systolic CHF ([place])     EF 45-50% 09/2014    Past Surgical History  Procedure Laterality Date  . Cholecystectomy    . Cesarean section    . Hernia repair    . Abdominal hysterectomy    . Ep implantable device N/A 09/26/2014    Procedure: Pacemaker Implant;  Surgeon: Evans Lance, MD;  Location: [facility];  Service: Cardiovascular;  Laterality: N/A;  . Appendectomy    . Hemorrhoid surgery    . Thoracentesis Right 01/07/15    Exudative Effusion Lymphocyte predominant    Family History  Problem Relation Age of Onset  . Heart disease Mother   . Hypertension Mother   . Diabetes Mother   . Diabetes    . Cancer      lung- in distant  releatives  . Cancer Brother   . Lupus Brother   . Stroke Mother   . Cholelithiasis Father   . Nephrolithiasis Father     Social History   Social History  . Marital Status: Widowed    Spouse Name: N/A  . Number of Children: N/A  . Years of Education: N/A   Occupational History  . Not on file.   Social History Main Topics  . Smoking status: Former Smoker -- 0.10 packs/day for 60 years    Types: Cigarettes  . Smokeless tobacco: Never Used     Comment: patient has stopped smoking on September 25, 2014  . Alcohol Use: No  . Drug Use: No  . Sexual Activity: No   Other Topics Concern  . Not on file   Social History Narrative   [facility]:   Patient is originally from [place]. She is lived in [place],, [place], [place], [place], and [place]. She moved to [place] in 2006. 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 No pet exposure. Remote bird exposure. No mold exposure.    Outpatient Prescriptions Prior to Visit  Medication Sig Dispense Refill  . aspirin 81 MG chewable tablet Chew 1 tablet (81 mg total) by mouth  daily. 30 tablet 11  . calcium-vitamin D (OSCAL WITH D) 500-200 MG-UNIT per tablet Take 1 tablet by mouth daily with breakfast.    . cetirizine (ZYRTEC) 10 MG tablet Take 10 mg by mouth every morning.    . fish oil-omega-3 fatty acids 1000 MG capsule Take 2 g by mouth daily at 12 noon.     . Multiple Vitamin (MULTIVITAMIN) tablet Take 1 tablet by mouth daily at 12 noon.     . Saline (SODIUM CHLORIDE) 0.65 % SOLN Place 1 spray into the nose as needed for congestion. 2 sprays each nostril once daily as needed    . Wheat Dextrin (BENEFIBER PO) Take 1 Applicatorful by mouth daily.    Marland Kitchen diltiazem (TIAZAC) 240 MG 24 hr capsule TAKE 1 CAPSULE (240 MG TOTAL) BY MOUTH DAILY. 30 capsule 5  . flecainide (TAMBOCOR) 50 MG tablet Take 1 tablet (50 mg total) by mouth every 12 (twelve) hours. 60 tablet 5  . furosemide  (LASIX) 20 MG tablet TAKE 1 TABLET (20 MG TOTAL) BY MOUTH DAILY AS NEEDED FOR EDEMA. 20 tablet 1  . metoprolol succinate (TOPROL-XL) 25 MG 24 hr tablet TAKE 1 TABLET (25 MG TOTAL) BY MOUTH DAILY. 90 tablet 1  . potassium chloride (K-DUR,KLOR-CON) 10 MEQ tablet Take 1 tablet (10 mEq total) by mouth daily. 30 tablet 6   No facility-administered medications prior to visit.    Allergies  Allergen Reactions  . Penicillins Shortness Of Breath, Diarrhea and Other (See Comments)    Stomach cramping  . Ciprofloxacin Other (See Comments)    myalgias  . Fexofenadine Other (See Comments)    unknown    Review of Systems  Constitutional: Positive for malaise/fatigue. Negative for fever.  HENT: Negative for congestion.   Eyes: Negative for blurred vision.  Respiratory: Positive for shortness of breath.   Cardiovascular: Negative for chest pain, palpitations and leg swelling.  Gastrointestinal: Negative for nausea, abdominal pain and blood in stool.  Genitourinary: Negative for dysuria and frequency.  Musculoskeletal: Negative for falls.  Skin: Negative for rash.  Neurological: Negative for dizziness, loss of consciousness and headaches.  Endo/Heme/Allergies: Negative for environmental allergies.  Psychiatric/Behavioral: Negative for depression. The patient is nervous/anxious.        Objective:    Physical Exam  Constitutional: She is oriented to person, place, and time. She appears well-developed and well-nourished. No distress.  HENT:  Head: Normocephalic and atraumatic.  Eyes: Conjunctivae are normal.  Neck: Neck supple. No thyromegaly present.  Cardiovascular: Normal rate, regular rhythm and normal heart sounds.   No murmur heard. Pulmonary/Chest: Effort normal and breath sounds normal. No respiratory distress.  Abdominal: Soft. Bowel sounds are normal. She exhibits no distension and no mass. There is no tenderness.  Musculoskeletal: She exhibits no edema.  Lymphadenopathy:    She  has no cervical adenopathy.  Neurological: She is alert and oriented to person, place, and time.  Skin: Skin is warm and dry.  Psychiatric: She has a normal mood and affect. Her behavior is normal.    Pulse 89  Temp(Src) 97.6 F (36.4 C) (Oral)  Ht 5\' 1"  (1.549 m)  Wt 107 lb 8 oz (48.762 kg)  BMI 20.32 kg/m2  SpO2 95% Wt Readings from Last 3 Encounters:  06/11/15 107 lb 8 oz (48.762 kg)  04/10/15 109 lb (49.442 kg)  03/05/15 105 lb 6 oz (47.798 kg)     Lab Results  Component Value Date   WBC 7.8 03/05/2015   HGB 12.8  03/05/2015   HCT 38.3 03/05/2015   PLT 269.0 03/05/2015   GLUCOSE 107* 03/05/2015   CHOL 195 08/28/2014   TRIG 56.0 08/28/2014   HDL 66.90 08/28/2014   LDLDIRECT 127.9 10/05/2011   LDLCALC 117* 08/28/2014   ALT 11 03/05/2015   AST 19 03/05/2015   NA 137 03/05/2015   K 5.0 03/05/2015   CL 101 03/05/2015   CREATININE 0.74 03/05/2015   BUN 14 03/05/2015   CO2 28 03/05/2015   TSH 3.60 03/05/2015   INR 1.27 09/27/2014    Lab Results  Component Value Date   TSH 3.60 03/05/2015   Lab Results  Component Value Date   WBC 7.8 03/05/2015   HGB 12.8 03/05/2015   HCT 38.3 03/05/2015   MCV 89.2 03/05/2015   PLT 269.0 03/05/2015   Lab Results  Component Value Date   NA 137 03/05/2015   K 5.0 03/05/2015   CO2 28 03/05/2015   GLUCOSE 107* 03/05/2015   BUN 14 03/05/2015   CREATININE 0.74 03/05/2015   BILITOT 0.5 03/05/2015   ALKPHOS 63 03/05/2015   AST 19 03/05/2015   ALT 11 03/05/2015   PROT 7.6 03/05/2015   ALBUMIN 4.1 03/05/2015   CALCIUM 10.0 03/05/2015   ANIONGAP 9 01/09/2015   GFR 78.51 03/05/2015   Lab Results  Component Value Date   CHOL 195 08/28/2014   Lab Results  Component Value Date   HDL 66.90 08/28/2014   Lab Results  Component Value Date   LDLCALC 117* 08/28/2014   Lab Results  Component Value Date   TRIG 56.0 08/28/2014   Lab Results  Component Value Date   CHOLHDL 3 08/28/2014   No results found for: HGBA1C      Assessment & Plan:   Problem List Items Addressed This Visit    Abdominal pain    More bloating than pain encouraged now probiotics. If no improvement call for abdominal ultrasound to be ordered      CHF (congestive heart failure) (HCC)    Minimize sodium and report worsening symptoms      Relevant Medications   potassium chloride (K-DUR,KLOR-CON) 10 MEQ tablet   diltiazem (TIAZAC) 240 MG 24 hr capsule   metoprolol succinate (TOPROL-XL) 25 MG 24 hr tablet   furosemide (LASIX) 20 MG tablet   flecainide (TAMBOCOR) 50 MG tablet   Essential hypertension - Primary    Well controlled, no changes to meds. Encouraged heart healthy diet such as the DASH diet and exercise as tolerated.       Relevant Medications   potassium chloride (K-DUR,KLOR-CON) 10 MEQ tablet   diltiazem (TIAZAC) 240 MG 24 hr capsule   metoprolol succinate (TOPROL-XL) 25 MG 24 hr tablet   furosemide (LASIX) 20 MG tablet   flecainide (TAMBOCOR) 50 MG tablet   Hyperlipidemia    Encouraged heart healthy diet, increase exercise, avoid trans fats, consider a krill oil cap daily      Relevant Medications   diltiazem (TIAZAC) 240 MG 24 hr capsule   metoprolol succinate (TOPROL-XL) 25 MG 24 hr tablet   furosemide (LASIX) 20 MG tablet   flecainide (TAMBOCOR) 50 MG tablet   Pleural effusion    Improved and lung exam is clear      Relevant Medications   potassium chloride (K-DUR,KLOR-CON) 10 MEQ tablet    Other Visit Diagnoses    Need for vaccination with 13-polyvalent pneumococcal conjugate vaccine        Relevant Orders    Pneumococcal conjugate vaccine 13-valent  IM (Completed)       I am having Ms. Fellows maintain her sodium chloride, multivitamin, fish oil-omega-3 fatty acids, cetirizine, aspirin, calcium-vitamin D, Wheat Dextrin (BENEFIBER PO), potassium chloride, diltiazem, metoprolol succinate, furosemide, and flecainide.  Meds ordered this encounter  Medications  . potassium chloride  (K-DUR,KLOR-CON) 10 MEQ tablet    Sig: Take 1 tablet (10 mEq total) by mouth daily.    Dispense:  30 tablet    Refill:  6  . diltiazem (TIAZAC) 240 MG 24 hr capsule    Sig: TAKE 1 CAPSULE (240 MG TOTAL) BY MOUTH DAILY.    Dispense:  30 capsule    Refill:  6    D/C PREVIOUS SCRIPTS FOR THIS MEDICATION  . metoprolol succinate (TOPROL-XL) 25 MG 24 hr tablet    Sig: TAKE 1 TABLET (25 MG TOTAL) BY MOUTH DAILY.    Dispense:  90 tablet    Refill:  1  . furosemide (LASIX) 20 MG tablet    Sig: TAKE 1 TABLET (20 MG TOTAL) BY MOUTH DAILY AS NEEDED FOR EDEMA.    Dispense:  20 tablet    Refill:  5  . flecainide (TAMBOCOR) 50 MG tablet    Sig: Take 1 tablet (50 mg total) by mouth every 12 (twelve) hours.    Dispense:  60 tablet    Refill:  5     Penni Homans, MD

## 2015-06-11 NOTE — Assessment & Plan Note (Signed)
Improved and lung exam is clear

## 2015-06-11 NOTE — Progress Notes (Signed)
Pre visit review using our clinic review tool, if applicable. No additional management support is needed unless otherwise documented below in the visit note. 

## 2015-06-11 NOTE — Assessment & Plan Note (Signed)
More bloating than pain encouraged now probiotics. If no improvement call for abdominal ultrasound to be ordered

## 2015-06-11 NOTE — Assessment & Plan Note (Signed)
Encouraged heart healthy diet, increase exercise, avoid trans fats, consider a krill oil cap daily 

## 2015-06-11 NOTE — Patient Instructions (Addendum)
NOW Probiotic Vitamin. Available at [employer].  Heart Failure Heart failure means your heart has trouble pumping blood. This makes it hard for your body to work well. Heart failure is usually a long-term (chronic) condition. You must take good care of yourself and follow your doctor's treatment plan. HOME CARE  Take your heart medicine as told by your doctor.  Do not stop taking medicine unless your doctor tells you to.  Do not skip any dose of medicine.  Refill your medicines before they run out.  Take other medicines only as told by your doctor or pharmacist.  Stay active if told by your doctor. The elderly and people with severe heart failure should talk with a doctor about physical activity.  Eat heart-healthy foods. Choose foods that are without trans fat and are low in saturated fat, cholesterol, and salt (sodium). This includes fresh or frozen fruits and vegetables, fish, lean meats, fat-free or low-fat dairy foods, whole grains, and high-fiber foods. Lentils and dried peas and beans (legumes) are also good choices.  Limit salt if told by your doctor.  Cook in a healthy way. Roast, grill, broil, bake, poach, steam, or stir-fry foods.  Limit fluids as told by your doctor.  Weigh yourself every morning. Do this after you pee (urinate) and before you eat breakfast. Write down your weight to give to your doctor.  Take your blood pressure and write it down if your doctor tells you to.  Ask your doctor how to check your pulse. Check your pulse as told.  Lose weight if told by your doctor.  Stop smoking or chewing tobacco. Do not use gum or patches that help you quit without your doctor's approval.  Schedule and go to doctor visits as told.  Nonpregnant women should have no more than 1 drink a day. Men should have no more than 2 drinks a day. Talk to your doctor about drinking alcohol.  Stop illegal drug use.  Stay current with shots (immunizations).  Manage your  health conditions as told by your doctor.  Learn to manage your stress.  Rest when you are tired.  If it is really hot outside:  Avoid intense activities.  Use air conditioning or fans, or get in a cooler place.  Avoid caffeine and alcohol.  Wear loose-fitting, lightweight, and light-colored clothing.  If it is really cold outside:  Avoid intense activities.  Layer your clothing.  Wear mittens or gloves, a hat, and a scarf when going outside.  Avoid alcohol.  Learn about heart failure and get support as needed.  Get help to maintain or improve your quality of life and your ability to care for yourself as needed. GET HELP IF:   You gain weight quickly.  You are more short of breath than usual.  You cannot do your normal activities.  You tire easily.  You cough more than normal, especially with activity.  You have any or more puffiness (swelling) in areas such as your hands, feet, ankles, or belly (abdomen).  You cannot sleep because it is hard to breathe.  You feel like your heart is beating fast (palpitations).  You get dizzy or light-headed when you stand up. GET HELP RIGHT AWAY IF:   You have trouble breathing.  There is a change in mental status, such as becoming less alert or not being able to focus.  You have chest pain or discomfort.  You faint. MAKE SURE YOU:   Understand these instructions.  Will watch your condition.  Will get help right away if you are not doing well or get worse.   This information is not intended to replace advice given to you by your health care provider. Make sure you discuss any questions you have with your health care provider.   Document Released: 12/22/2007 Document Revised: 04/04/2014 Document Reviewed: 04/30/2012 Elsevier Interactive Patient Education [employer].

## 2015-06-20 DIAGNOSIS — I442 Atrioventricular block, complete: Secondary | ICD-10-CM | POA: Diagnosis not present

## 2015-06-20 DIAGNOSIS — Z950 Presence of cardiac pacemaker: Secondary | ICD-10-CM

## 2015-06-23 ENCOUNTER — Telehealth: Payer: Self-pay | Admitting: Cardiology

## 2015-06-23 ENCOUNTER — Ambulatory Visit (INDEPENDENT_AMBULATORY_CARE_PROVIDER_SITE_OTHER): Payer: Medicare Other | Admitting: *Deleted

## 2015-06-23 DIAGNOSIS — Z950 Presence of cardiac pacemaker: Secondary | ICD-10-CM

## 2015-06-23 DIAGNOSIS — I442 Atrioventricular block, complete: Secondary | ICD-10-CM

## 2015-06-23 NOTE — Telephone Encounter (Signed)
Spoke with pt and reminded pt of remote transmission that is due today. Pt verbalized understanding.   

## 2015-06-23 NOTE — Progress Notes (Signed)
Remote pacemaker transmission.   

## 2015-06-25 ENCOUNTER — Other Ambulatory Visit: Payer: Self-pay | Admitting: Family Medicine

## 2015-07-03 LAB — CUP PACEART REMOTE DEVICE CHECK
Brady Statistic AP VP Percent: 0 %
Brady Statistic AS VP Percent: 0 %
Date Time Interrogation Session: 20170325213352
Implantable Lead Implant Date: 20160701
Implantable Lead Location: 753860
Implantable Lead Model: 5076
Implantable Lead Model: 5076
Lead Channel Pacing Threshold Amplitude: 0.625 V
Lead Channel Pacing Threshold Amplitude: 0.625 V
Lead Channel Pacing Threshold Pulse Width: 0.4 ms
MDC IDC LEAD IMPLANT DT: 20160701
MDC IDC LEAD LOCATION: 753859
MDC IDC MSMT BATTERY IMPEDANCE: 100 Ohm
MDC IDC MSMT BATTERY REMAINING LONGEVITY: 153 mo
MDC IDC MSMT BATTERY VOLTAGE: 2.79 V
MDC IDC MSMT LEADCHNL RA IMPEDANCE VALUE: 510 Ohm
MDC IDC MSMT LEADCHNL RV IMPEDANCE VALUE: 557 Ohm
MDC IDC MSMT LEADCHNL RV PACING THRESHOLD PULSEWIDTH: 0.4 ms
MDC IDC MSMT LEADCHNL RV SENSING INTR AMPL: 8 mV
MDC IDC SET LEADCHNL RA PACING AMPLITUDE: 1.5 V
MDC IDC SET LEADCHNL RV PACING AMPLITUDE: 2.5 V
MDC IDC SET LEADCHNL RV PACING PULSEWIDTH: 0.4 ms
MDC IDC SET LEADCHNL RV SENSING SENSITIVITY: 4 mV
MDC IDC STAT BRADY AP VS PERCENT: 89 %
MDC IDC STAT BRADY AS VS PERCENT: 11 %

## 2015-07-07 ENCOUNTER — Encounter: Payer: Self-pay | Admitting: Cardiology

## 2015-07-09 ENCOUNTER — Ambulatory Visit: Payer: Self-pay | Admitting: Pulmonary Disease

## 2015-07-21 ENCOUNTER — Encounter: Payer: Self-pay | Admitting: Cardiology

## 2015-07-29 ENCOUNTER — Ambulatory Visit (INDEPENDENT_AMBULATORY_CARE_PROVIDER_SITE_OTHER): Payer: Medicare Other | Admitting: Pulmonary Disease

## 2015-07-29 ENCOUNTER — Encounter: Payer: Self-pay | Admitting: Pulmonary Disease

## 2015-07-29 ENCOUNTER — Ambulatory Visit (INDEPENDENT_AMBULATORY_CARE_PROVIDER_SITE_OTHER)
Admission: RE | Admit: 2015-07-29 | Discharge: 2015-07-29 | Disposition: A | Discharge status: Home / Self Care | Payer: Medicare Other | Source: Ambulatory Visit | Attending: Pulmonary Disease | Admitting: Pulmonary Disease

## 2015-07-29 VITALS — BP 116/72 | HR 70 | Ht 61.0 in | Wt 109.0 lb

## 2015-07-29 DIAGNOSIS — R911 Solitary pulmonary nodule: Secondary | ICD-10-CM | POA: Diagnosis not present

## 2015-07-29 DIAGNOSIS — J90 Pleural effusion, not elsewhere classified: Secondary | ICD-10-CM

## 2015-07-29 DIAGNOSIS — I517 Cardiomegaly: Secondary | ICD-10-CM | POA: Diagnosis not present

## 2015-07-29 NOTE — Patient Instructions (Signed)
We will follow up in 6 months with a repeat chest x-ray. Return to clinic on the day of chest x-ray.

## 2015-07-29 NOTE — Progress Notes (Signed)
Subjective:    Patient ID: Beth [name], female    DOB: Apr 25, 1923, 82 y.o.   MRN: [id number]  PROBLEM LIST: CHF Right pleural effusion- Exudative lymphocyte predominant. Pulmonary nodules  HPI Beth [name] is here for follow-up of pleural effusion. She has a known history of pulmonary nodules dating back to 2007 but has declined further workup or biopsy. She also has history of atrial fibrillation, pacemaker placement. She was admitted on 01/06/15 with lower extremity edema, fatigue, dyspnea. This was thought to be secondary to heart failure. She was also noted to have a unilateral moderate right pleural effusion. Thoracentesis showed an exudative lymphocyte predominant fluid. >1Lt removed. All cultures and cytology and autoimmune workup has been negative so far.  Since discharge he reports feeling well with no shortness of breath, chest pain, palpitations. She's been taking her Lasix daily. She does not note any lower extremity edema. She's been measuring her weights daily and they have been constant between 100-105 pounds.  DATA: CXR 01/07/15 No pneumothorax following thoracentesis with removal of most of the effusion from the right side. Small residual effusion remains on the right. Multiple nodular lesions remain, stable. No new opacity. Underlying emphysema. Pacemaker leads attached to right heart.  CXR 07/29/15 Continued improvement of right effusion. Images reviewed.  CT chest 01/06/15 1. Numerous progressive partially calcified lung nodules. Differential diagnostic possibilities include treated as well as untreated various metastatic diseases. TB, histoplasmosis, and pulmonary amyloidosis are other considerations, as would be silicosis or other occupational lung diseases. There is no calcified adenopathy as would be expected with TB, histoplasmosis, or occupational exposure. 2. Right pleural effusion. 3. Acute to subacute T 11 compression deformity, with mild  canal Narrowing.  Echo 01/09/15 LVEF 40-45% with basal and mid inferior and inferolateral hypokinesis. Moderate LVH. Grade 1 diastolic dysfunction. PASP 47 mm.  Pleural fluid 01/07/15 WBC 2032, 78% lymphs, LDH 133, total protein 5, pH 8. Cultures-no growth to date Cytology-reactive mesothelial cells, no malignancy.  01/09/15 ANA, CRP, RF, CCP, double-stranded DNA, Smith ab, SSA, SSB, coccidiosis antibody all negative Sedimentation rate 53.  Past Medical History  Diagnosis Date  . Hypertension   . Lung nodule     CT of chest 2007-left loewr lobar mass- evaluated by pulmonary- patient refused further work up  . Tobacco abuse   . Vertigo   . Abdominal pain, unspecified site 08/17/2012  . Other and unspecified hyperlipidemia 08/17/2012  . Loss of weight 01/19/2013  . Allergic state 06/22/2013  . Allergy   . Osteoporosis   . Syncope     fall  . Increased urinary frequency 01/02/2014  . Glaucoma 07/06/2014  . COPD (chronic obstructive pulmonary disease) ([place])   . Atrial fibrillation with rapid ventricular response ([place])   . Arthritis   . Systolic CHF ([place])     EF 45-50% 09/2014    Current outpatient prescriptions:  .  aspirin 81 MG chewable tablet, Chew 1 tablet (81 mg total) by mouth daily., Disp: 30 tablet, Rfl: 11 .  calcium-vitamin D (OSCAL WITH D) 500-200 MG-UNIT per tablet, Take 1 tablet by mouth daily with breakfast., Disp: , Rfl:  .  cetirizine (ZYRTEC) 10 MG tablet, Take 10 mg by mouth every morning., Disp: , Rfl:  .  diltiazem (TIAZAC) 240 MG 24 hr capsule, TAKE 1 CAPSULE (240 MG TOTAL) BY MOUTH DAILY., Disp: 30 capsule, Rfl: 6 .  fish oil-omega-3 fatty acids 1000 MG capsule, Take 2 g by mouth daily at 12 noon. , Disp: ,  Rfl:  .  flecainide (TAMBOCOR) 50 MG tablet, TAKE 1 TABLET (50 MG TOTAL) BY MOUTH EVERY 12 (TWELVE) HOURS., Disp: 60 tablet, Rfl: 5 .  furosemide (LASIX) 20 MG tablet, TAKE 1 TABLET (20 MG TOTAL) BY MOUTH DAILY AS NEEDED FOR EDEMA., Disp: 20 tablet, Rfl:  5 .  metoprolol succinate (TOPROL-XL) 25 MG 24 hr tablet, TAKE 1 TABLET (25 MG TOTAL) BY MOUTH DAILY., Disp: 90 tablet, Rfl: 1 .  Multiple Vitamin (MULTIVITAMIN) tablet, Take 1 tablet by mouth daily at 12 noon. , Disp: , Rfl:  .  potassium chloride (K-DUR,KLOR-CON) 10 MEQ tablet, Take 1 tablet (10 mEq total) by mouth daily., Disp: 30 tablet, Rfl: 6 .  Probiotic Product (PROBIOTIC ADVANCED) CAPS, Take 1 capsule by mouth daily., Disp: , Rfl:  .  Saline (SODIUM CHLORIDE) 0.65 % SOLN, Place 1 spray into the nose as needed for congestion. 2 sprays each nostril once daily as needed, Disp: , Rfl:  .  Wheat Dextrin (BENEFIBER PO), Take 1 Applicatorful by mouth daily., Disp: , Rfl:   Review of Systems Denies any cough, sputum production, dyspnea, wheezing. Denies any chest pain, palpitations. Denies any fevers, chills, loss of weight, loss of appetite, malaise, fatigue. Denies any nausea, vomiting, diarrhea, constipation. All other review of systems negative.    Objective:   Physical Exam Blood pressure 122/60, pulse 63, height 5\' 1"  (1.549 m), weight 109 lb (49.442 kg), SpO2 94 %. Gen.: Pleasant elderly female. No apparent distress Neuro: No gross focal deficits. Neck: No JVD, lymphadenopathy, thyromegaly. RS: Bibasilar crackles, no wheeze, CVS: S1-S2 heard, no murmurs rubs gallops. Abdomen: Soft, positive bowel sounds. Extremities: Trace edema.    Assessment & Plan:  Rt Pleural effusion, pulmonary nodules s/p hospitalization with right lymphocytic predominant exudative effusion of unclear etiology. Lymphocytic effusions are most commonly due to malignancy or TB. She has a long history of bilateral parenchymal nodules the last CT scan shows that they're slightly larger. However the pleural fluid cytology is negative and she is not interested in further workup, biopsy. She does not have any risk factors for TB. Autoimmune workup, cultures are negative.    The daughter reports that she fell and  hit the right side of her chest about a month before the effusion was discovered. I believe the effusion is due to pulmonary contusion , inflammation. It is possible that her effusion is secondary to decompensated congestive heart failure but it is unusual to have a unilateral exudative effusion.  Chest x-ray is reviewed with her today. It shows continued improvement of her effusion. I will have her come back in 6 months with a repeat x-ray. If this is unremarkable as well then she will not need to follow up with [place].  Plan: - Continue to monitor effusion. No indication for repeat thoracentesis  Marshell Garfinkel MD [facility] Pager [phone] If no answer or after 3pm call: [phone] 07/29/2015, 3:49 PM

## 2015-08-31 DIAGNOSIS — H40033 Anatomical narrow angle, bilateral: Secondary | ICD-10-CM | POA: Diagnosis not present

## 2015-08-31 DIAGNOSIS — H04123 Dry eye syndrome of bilateral lacrimal glands: Secondary | ICD-10-CM | POA: Diagnosis not present

## 2015-08-31 DIAGNOSIS — H2513 Age-related nuclear cataract, bilateral: Secondary | ICD-10-CM | POA: Diagnosis not present

## 2015-08-31 DIAGNOSIS — H25013 Cortical age-related cataract, bilateral: Secondary | ICD-10-CM | POA: Diagnosis not present

## 2015-09-15 ENCOUNTER — Ambulatory Visit (INDEPENDENT_AMBULATORY_CARE_PROVIDER_SITE_OTHER): Payer: Medicare Other | Admitting: Internal Medicine

## 2015-09-15 ENCOUNTER — Encounter: Payer: Self-pay | Admitting: Internal Medicine

## 2015-09-15 VITALS — BP 152/64 | HR 72 | Ht 61.0 in | Wt 109.0 lb

## 2015-09-15 DIAGNOSIS — I442 Atrioventricular block, complete: Secondary | ICD-10-CM

## 2015-09-15 DIAGNOSIS — I495 Sick sinus syndrome: Secondary | ICD-10-CM | POA: Diagnosis not present

## 2015-09-15 DIAGNOSIS — Z950 Presence of cardiac pacemaker: Secondary | ICD-10-CM

## 2015-09-15 LAB — CUP PACEART INCLINIC DEVICE CHECK
Battery Impedance: 111 Ohm
Battery Remaining Longevity: 148 mo
Brady Statistic AP VP Percent: 0 %
Brady Statistic AS VS Percent: 11 %
Date Time Interrogation Session: 20170620163235
Implantable Lead Location: 753860
Lead Channel Impedance Value: 518 Ohm
Lead Channel Pacing Threshold Amplitude: 0.625 V
Lead Channel Pacing Threshold Amplitude: 0.75 V
Lead Channel Pacing Threshold Pulse Width: 0.4 ms
Lead Channel Pacing Threshold Pulse Width: 0.4 ms
Lead Channel Pacing Threshold Pulse Width: 0.4 ms
Lead Channel Sensing Intrinsic Amplitude: 1.4 mV
Lead Channel Setting Pacing Pulse Width: 0.4 ms
Lead Channel Setting Sensing Sensitivity: 4 mV
MDC IDC LEAD IMPLANT DT: 20160701
MDC IDC LEAD IMPLANT DT: 20160701
MDC IDC LEAD LOCATION: 753859
MDC IDC MSMT BATTERY VOLTAGE: 2.79 V
MDC IDC MSMT LEADCHNL RA IMPEDANCE VALUE: 481 Ohm
MDC IDC MSMT LEADCHNL RV PACING THRESHOLD AMPLITUDE: 0.75 V
MDC IDC MSMT LEADCHNL RV PACING THRESHOLD AMPLITUDE: 0.75 V
MDC IDC MSMT LEADCHNL RV PACING THRESHOLD PULSEWIDTH: 0.4 ms
MDC IDC MSMT LEADCHNL RV SENSING INTR AMPL: 8 mV
MDC IDC SET LEADCHNL RA PACING AMPLITUDE: 1.5 V
MDC IDC SET LEADCHNL RV PACING AMPLITUDE: 2.5 V
MDC IDC STAT BRADY AP VS PERCENT: 89 %
MDC IDC STAT BRADY AS VP PERCENT: 0 %

## 2015-09-15 NOTE — Patient Instructions (Signed)
Medication Instructions:  Your physician recommends that you continue on your current medications as directed. Please refer to the Current Medication list given to you today.   Labwork: None ordered   Testing/Procedures: None ordered   Follow-Up: Your physician wants you to follow-up in: 12 months with Dr Taylor You will receive a reminder letter in the mail two months in advance. If you don't receive a letter, please call our office to schedule the follow-up appointment.  Remote monitoring is used to monitor your Pacemaker from home. This monitoring reduces the number of office visits required to check your device to one time per year. It allows us to keep an eye on the functioning of your device to ensure it is working properly. You are scheduled for a device check from home on 12/15/15. You may send your transmission at any time that day. If you have a wireless device, the transmission will be sent automatically. After your physician reviews your transmission, you will receive a postcard with your next transmission date.    Any Other Special Instructions Will Be Listed Below (If Applicable).     If you need a refill on your cardiac medications before your next appointment, please call your pharmacy.   

## 2015-09-15 NOTE — Progress Notes (Signed)
HPI Mrs. Gendreau returns today for followup. She is a pleasant elderly woman with symptomatic bradycardia, s/p PPM insertion. She c/o tingling over her PM insertion site. She was placed on losartan for her HTN but could not tolerate the medication. No syncope. She is fairly sedentary. She has had no recurrent syncope since her PPM insertion. Allergies  Allergen Reactions  . Penicillins Shortness Of Breath, Diarrhea and Other (See Comments)    Stomach cramping  . Ciprofloxacin Other (See Comments)    myalgias  . Fexofenadine Other (See Comments)    unknown     Current Outpatient Prescriptions  Medication Sig Dispense Refill  . aspirin 81 MG chewable tablet Chew 81 mg by mouth 3 (three) times a week.    . calcium-vitamin D (OSCAL WITH D) 500-200 MG-UNIT per tablet Take 1 tablet by mouth 3 (three) times a week.     . cetirizine (ZYRTEC) 10 MG tablet Take 10 mg by mouth every morning.    . diltiazem (TIAZAC) 240 MG 24 hr capsule TAKE 1 CAPSULE (240 MG TOTAL) BY MOUTH DAILY. 30 capsule 6  . fish oil-omega-3 fatty acids 1000 MG capsule Take 2 g by mouth daily at 12 noon.     . flecainide (TAMBOCOR) 50 MG tablet TAKE 1 TABLET (50 MG TOTAL) BY MOUTH EVERY 12 (TWELVE) HOURS. 60 tablet 5  . furosemide (LASIX) 20 MG tablet TAKE 1 TABLET (20 MG TOTAL) BY MOUTH DAILY AS NEEDED FOR EDEMA. 20 tablet 5  . metoprolol succinate (TOPROL-XL) 25 MG 24 hr tablet TAKE 1 TABLET (25 MG TOTAL) BY MOUTH DAILY. 90 tablet 1  . Multiple Vitamin (MULTIVITAMIN) tablet Take 1 tablet by mouth daily at 12 noon.     . potassium chloride (K-DUR,KLOR-CON) 10 MEQ tablet Take 10 mEq by mouth daily as needed (for when you take Lasix).    . Probiotic Product (PROBIOTIC ADVANCED) CAPS Take 1 capsule by mouth daily.    . Saline (SODIUM CHLORIDE) 0.65 % SOLN Place 1 spray into the nose as needed for congestion. 2 sprays each nostril once daily as needed    . Wheat Dextrin (BENEFIBER PO) Take 1 capsule by mouth daily.        No current facility-administered medications for this visit.     Past Medical History  Diagnosis Date  . Hypertension   . Lung nodule     CT of chest 2007-left loewr lobar mass- evaluated by pulmonary- patient refused further work up  . Tobacco abuse   . Vertigo   . Abdominal pain, unspecified site 08/17/2012  . Other and unspecified hyperlipidemia 08/17/2012  . Loss of weight 01/19/2013  . Allergic state 06/22/2013  . Allergy   . Osteoporosis   . Syncope     fall  . Increased urinary frequency 01/02/2014  . Glaucoma 07/06/2014  . COPD (chronic obstructive pulmonary disease) ([place])   . Atrial fibrillation with rapid ventricular response ([place])   . Arthritis   . Systolic CHF ([place])     EF 45-50% 09/2014    ROS:   All systems reviewed and negative except as noted in the HPI.   Past Surgical History  Procedure Laterality Date  . Cholecystectomy    . Cesarean section    . Hernia repair    . Abdominal hysterectomy    . Ep implantable device N/A 09/26/2014    Procedure: Pacemaker Implant;  Surgeon: Evans Lance, MD;  Location: [facility];  Service: Cardiovascular;  Laterality: N/A;  . Appendectomy    . Hemorrhoid surgery    . Thoracentesis Right 01/07/15    Exudative Effusion Lymphocyte predominant     Family History  Problem Relation Age of Onset  . Heart disease Mother   . Hypertension Mother   . Diabetes Mother   . Diabetes    . Cancer      lung- in distant releatives  . Cancer Brother   . Lupus Brother   . Stroke Mother   . Cholelithiasis Father   . Nephrolithiasis Father      Social History   Social History  . Marital Status: Widowed    Spouse Name: N/A  . Number of Children: N/A  . Years of Education: N/A   Occupational History  . Not on file.   Social History Main Topics  . Smoking status: Former Smoker -- 0.10 packs/day for 60 years    Types: Cigarettes  . Smokeless tobacco: Never Used     Comment: patient has stopped smoking on September 25, 2014  . Alcohol Use: No  . Drug Use: No  . Sexual Activity: No   Other Topics Concern  . Not on file   Social History Narrative   [facility]:   Patient is originally from [place]. She is lived in [place],, [place], [place], [place], and [place]. She moved to [place] in 2006. 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 No pet exposure. Remote bird exposure. No mold exposure.     BP 152/64 mmHg  Pulse 72  Ht 5\' 1"  (1.549 m)  Wt 109 lb (49.442 kg)  BMI 20.61 kg/m2  Physical Exam:  Well appearing elderly woman, NAD HEENT: Unremarkable Neck:  6 cm JVD, no thyromegally Lymphatics:  No adenopathy Back:  No CVA tenderness Lungs:  Clear with no wheezes HEART:  Regular rate rhythm, no murmurs, no rubs, no clicks Abd:  soft, positive bowel sounds, no organomegally, no rebound, no guarding Ext:  2 plus pulses, no edema, no cyanosis, no clubbing Skin:  No rashes no nodules Neuro:  CN II through XII intact, motor grossly intact  EKG - nsr with ventricular pacing  DEVICE  Normal device function.  See PaceArt for details.   Assess/Plan: 1. Sinus node dysfunction - she is now asymptomatic with no recurrent syncope 2. PAF - she is maintaining NSR. She has had bleeding on anti-coagulation and this was stopped 3. PPM - her device is working normally. Will recheck in several months.  Mikle Bosworth.D.

## 2015-10-13 ENCOUNTER — Ambulatory Visit: Payer: Medicare Other | Admitting: Family Medicine

## 2015-10-14 ENCOUNTER — Ambulatory Visit (INDEPENDENT_AMBULATORY_CARE_PROVIDER_SITE_OTHER): Payer: Medicare Other | Admitting: Family Medicine

## 2015-10-14 ENCOUNTER — Encounter: Payer: Self-pay | Admitting: Family Medicine

## 2015-10-14 VITALS — BP 110/60 | HR 83 | Temp 98.2°F | Ht 61.0 in | Wt 109.2 lb

## 2015-10-14 DIAGNOSIS — R300 Dysuria: Secondary | ICD-10-CM | POA: Diagnosis not present

## 2015-10-14 DIAGNOSIS — R21 Rash and other nonspecific skin eruption: Secondary | ICD-10-CM

## 2015-10-14 DIAGNOSIS — R140 Abdominal distension (gaseous): Secondary | ICD-10-CM

## 2015-10-14 LAB — POC URINALSYSI DIPSTICK (AUTOMATED)
Bilirubin, UA: NEGATIVE
Blood, UA: NEGATIVE
GLUCOSE UA: NEGATIVE
Ketones, UA: NEGATIVE
LEUKOCYTES UA: NEGATIVE
NITRITE UA: NEGATIVE
PROTEIN UA: NEGATIVE
Spec Grav, UA: 1.015
UROBILINOGEN UA: NEGATIVE
pH, UA: 6

## 2015-10-14 MED ORDER — TRIAMCINOLONE ACETONIDE 0.1 % EX CREA: 1.0000 "application " | TOPICAL_CREAM | Freq: Two times a day (BID) | CUTANEOUS | Status: DC

## 2015-10-14 NOTE — Progress Notes (Signed)
[place] at [facility] [address], [place], [place] [address] [phone]  Date:  10/14/2015   Name:  Beth [name]   DOB:  07/15/1923   MRN:  [id number]  PCP:  Penni Homans, MD    Chief Complaint: Follow-up   History of Present Illness:  Beth [name] is a 82 y.o. very pleasant female patient who presents with the following:  Elderly lady with history of CHF/ a fib, pacemaker for symptomatic bradycardia Seen by Dr. Lovena Le last month.  She is no longer on anticoagulation due to bleeding She was most recently in SR- she does not have any cardiac symptoms or complaints such as CP or palpitations at this time.    She has noted dysuria and a feeling that she is not emptying her bladder that just started this morning.  She felt like it was harder to pass her urine than is normal for her  She takes lasix just as needed for weight change or ankle swelling.  She does have a pleural effusion but this had decreased in size at her last pulmonology visit.  They plan to re-xray her chest in about 6 months to make sure that it continues to improve  She also feels that she has a lot of GI tract gas.  She feels that her lower abd is bloated and she notes a "spot like that" in her left lower quadrant that is "like a balloon."  She has had issues with feeling bloated for about one year.  Per her report she is s/p complete hysterectomy.  She wonders if there is any medication she could use OTC for excess gas  She has a normal appetite and does not belly pain.  However she complains that her stomach is not flat like it used to be and that she is not able to "suck in my gut" any longer  BP Readings from Last 3 Encounters:  10/14/15 110/56  09/15/15 152/64  07/29/15 116/72   Wt Readings from Last 3 Encounters:  10/14/15 109 lb 3.2 oz (49.533 kg)  09/15/15 109 lb (49.442 kg)  07/29/15 109 lb (49.442 kg)     Patient Active Problem List    Diagnosis Date Noted  . Pleural effusion 06/11/2015  . Atrial fibrillation ([place]) 01/06/2015  . Pulmonary nodule 01/06/2015  . CHF (congestive heart failure) ([place]) 01/06/2015  . CHF exacerbation ([place]) 01/05/2015  . Pacemaker 12/23/2014  . Chest pain 11/17/2014  . Chronic systolic heart failure ([place]) 09/28/2014  . Midline low back pain without sciatica   . Sinus brady-tachy syndrome ([place]) 09/26/2014  . Solitary pulmonary nodule 09/26/2014  . Pulmonary HTN ([place]) 09/26/2014  . Glaucoma 07/06/2014  . Depression 04/20/2014  . Increased urinary frequency 01/02/2014  . Atypical chest pain 09/03/2013  . Constipation 09/03/2013  . Weakness 07/13/2013  . Protein-calorie malnutrition, severe ([place]) 06/30/2013  . Syncope 06/28/2013  . Allergic state 06/22/2013  . Facial skin lesion 06/22/2013  . Loss of weight 01/19/2013  . Abdominal pain 08/17/2012  . Hyperlipidemia 08/17/2012  . Aortic insufficiency 09/22/2011  . Dystrophic nail 05/14/2011  . Neck pain 03/08/2011  . TMJ syndrome 03/08/2011  . Fatigue 02/11/2011  . Alopecia 10/16/2010  . Osteoporosis 07/08/2008  . LUNG NODULE 06/03/2008  . Vitamin D deficiency 10/30/2007  . MICROSCOPIC HEMATURIA 02/20/2007  . Essential hypertension 02/14/2007    Past Medical History  Diagnosis Date  . Hypertension   . Lung nodule  CT of chest 2007-left loewr lobar mass- evaluated by pulmonary- patient refused further work up  . Tobacco abuse   . Vertigo   . Abdominal pain, unspecified site 08/17/2012  . Other and unspecified hyperlipidemia 08/17/2012  . Loss of weight 01/19/2013  . Allergic state 06/22/2013  . Allergy   . Osteoporosis   . Syncope     fall  . Increased urinary frequency 01/02/2014  . Glaucoma 07/06/2014  . COPD (chronic obstructive pulmonary disease) ([place])   . Atrial fibrillation with rapid ventricular response ([place])   . Arthritis   . Systolic CHF ([place])     EF 45-50% 09/2014    Past Surgical History  Procedure  Laterality Date  . Cholecystectomy    . Cesarean section    . Hernia repair    . Abdominal hysterectomy    . Ep implantable device N/A 09/26/2014    Procedure: Pacemaker Implant;  Surgeon: Evans Lance, MD;  Location: [facility];  Service: Cardiovascular;  Laterality: N/A;  . Appendectomy    . Hemorrhoid surgery    . Thoracentesis Right 01/07/15    Exudative Effusion Lymphocyte predominant    Social History  Substance Use Topics  . Smoking status: Former Smoker -- 0.10 packs/day for 60 years    Types: Cigarettes  . Smokeless tobacco: Never Used     Comment: patient has stopped smoking on September 25, 2014  . Alcohol Use: No    Family History  Problem Relation Age of Onset  . Heart disease Mother   . Hypertension Mother   . Diabetes Mother   . Diabetes    . Cancer      lung- in distant releatives  . Cancer Brother   . Lupus Brother   . Stroke Mother   . Cholelithiasis Father   . Nephrolithiasis Father     Allergies  Allergen Reactions  . Penicillins Shortness Of Breath, Diarrhea and Other (See Comments)    Stomach cramping  . Ciprofloxacin Other (See Comments)    myalgias  . Fexofenadine Other (See Comments)    unknown    Medication list has been reviewed and updated.  Current Outpatient Prescriptions on File Prior to Visit  Medication Sig Dispense Refill  . aspirin 81 MG chewable tablet Chew 81 mg by mouth 3 (three) times a week.    . calcium-vitamin D (OSCAL WITH D) 500-200 MG-UNIT per tablet Take 1 tablet by mouth 3 (three) times a week.     . cetirizine (ZYRTEC) 10 MG tablet Take 10 mg by mouth every morning.    . diltiazem (TIAZAC) 240 MG 24 hr capsule TAKE 1 CAPSULE (240 MG TOTAL) BY MOUTH DAILY. 30 capsule 6  . fish oil-omega-3 fatty acids 1000 MG capsule Take 2 g by mouth daily at 12 noon.     . flecainide (TAMBOCOR) 50 MG tablet TAKE 1 TABLET (50 MG TOTAL) BY MOUTH EVERY 12 (TWELVE) HOURS. 60 tablet 5  . furosemide (LASIX) 20 MG tablet TAKE 1  TABLET (20 MG TOTAL) BY MOUTH DAILY AS NEEDED FOR EDEMA. 20 tablet 5  . metoprolol succinate (TOPROL-XL) 25 MG 24 hr tablet TAKE 1 TABLET (25 MG TOTAL) BY MOUTH DAILY. 90 tablet 1  . Multiple Vitamin (MULTIVITAMIN) tablet Take 1 tablet by mouth daily at 12 noon.     . potassium chloride (K-DUR,KLOR-CON) 10 MEQ tablet Take 10 mEq by mouth daily as needed (for when you take Lasix).    . Probiotic Product (PROBIOTIC ADVANCED)  CAPS Take 1 capsule by mouth daily.    . Saline (SODIUM CHLORIDE) 0.65 % SOLN Place 1 spray into the nose as needed for congestion. 2 sprays each nostril once daily as needed    . Wheat Dextrin (BENEFIBER PO) Take 1 capsule by mouth daily.      No current facility-administered medications on file prior to visit.    Review of Systems:  As per HPI- otherwise negative.   Physical Examination: Filed Vitals:   10/14/15 1501 10/14/15 1503  BP: 156/50 110/56  Pulse: 83   Temp: 98.2 F (36.8 C)    Filed Vitals:   10/14/15 1501  Height: 5\' 1"  (1.549 m)  Weight: 109 lb 3.2 oz (49.533 kg)   Body mass index is 20.64 kg/(m^2). Ideal Body Weight: Weight in (lb) to have BMI = 25: 132  GEN: WDWN, NAD, Non-toxic, A & O x 3, well appearing elderly lady HEENT: Atraumatic, Normocephalic. Neck supple. No masses, No LAD. Ears and Nose: No external deformity. CV: RRR, No M/G/R. No JVD. No thrill. No extra heart sounds. PULM: CTA B, no wheezes, crackles, rhonchi. No retractions. No resp. distress. No accessory muscle use. ABD: S, NT, ND, +BS. No rebound. No HSM.  She notes "pressure" with deep palpation of her lower abdomen but not pain.  No masses or other abnl noted  EXTR: No c/c/e NEURO Normal gait.  PSYCH: Normally interactive. Conversant. Not depressed or anxious appearing.  Calm demeanor.  Also she notes a scaly area on her left shin- she has used moisturizers but they have not seemed to help.    Results for orders placed or performed in visit on 10/14/15  POCT Urinalysis  Dipstick (Automated)  Result Value Ref Range   Color, UA Yellow    Clarity, UA Clear    Glucose, UA Negative    Bilirubin, UA Negative    Ketones, UA Negative    Spec Grav, UA 1.015    Blood, UA Negative    pH, UA 6.0    Protein, UA Negative    Urobilinogen, UA negative    Nitrite, UA negative    Leukocytes, UA Negative Negative   Pt drank water, then went to restroom and voided over 200 ml, she noted that she had no pain or difficulty in emptying her bladder  Assessment and Plan: Dysuria - Plan: POCT Urinalysis Dipstick (Automated), Urine culture  Rash and nonspecific skin eruption - Plan: triamcinolone cream (KENALOG) 0.1 %  Abdominal bloating  Here today with dysuria which now seems to be resolved.  Still will check a urine culture given her age Scaly rash on shin- likely a non specific spongiotic derm.  Will try triamcinolone abd bloating of long duration.  Will check with her PCP to make sure that her ovaries were removed. Asked her to try OTC simethicone as needed for bloating and see if this is helpful  Signed Lamar Blinks, MD

## 2015-10-14 NOTE — Progress Notes (Signed)
Pre visit review using our clinic review tool, if applicable. No additional management support is needed unless otherwise documented below in the visit note. 

## 2015-10-14 NOTE — Patient Instructions (Addendum)
Please let me know if you have any other urinary symptoms.   Your urine appears clean but we will do a culture to make sure it is negative for any infection  If you like, OTC simethicone may be helpful for you to take prior to meals.  Take 80mg  before you eat your meals and at bedtime as needed   Try the steroid cream on your leg twice a day

## 2015-10-15 LAB — URINE CULTURE: Organism ID, Bacteria: 10000

## 2015-11-10 ENCOUNTER — Ambulatory Visit: Payer: Medicare Other | Admitting: Family Medicine

## 2015-11-17 ENCOUNTER — Encounter: Payer: Self-pay | Admitting: Family Medicine

## 2015-11-24 ENCOUNTER — Other Ambulatory Visit: Payer: Self-pay | Admitting: Family Medicine

## 2015-12-01 ENCOUNTER — Other Ambulatory Visit: Payer: Self-pay | Admitting: Family Medicine

## 2015-12-01 MED ORDER — DILTIAZEM HCL ER BEADS 240 MG PO CP24: ORAL_CAPSULE | ORAL | 0 refills | Status: DC

## 2015-12-03 ENCOUNTER — Ambulatory Visit: Payer: Self-pay | Admitting: Family Medicine

## 2015-12-15 ENCOUNTER — Telehealth: Payer: Self-pay | Admitting: Cardiology

## 2015-12-15 ENCOUNTER — Ambulatory Visit (INDEPENDENT_AMBULATORY_CARE_PROVIDER_SITE_OTHER): Payer: Medicare Other | Admitting: *Deleted

## 2015-12-15 DIAGNOSIS — I442 Atrioventricular block, complete: Secondary | ICD-10-CM | POA: Diagnosis not present

## 2015-12-15 DIAGNOSIS — I495 Sick sinus syndrome: Secondary | ICD-10-CM

## 2015-12-15 NOTE — Telephone Encounter (Signed)
Spoke with pt and reminded pt of remote transmission that is due today. Pt verbalized understanding.   

## 2015-12-15 NOTE — Progress Notes (Signed)
Remote pacemaker transmission.   

## 2015-12-16 ENCOUNTER — Encounter: Payer: Self-pay | Admitting: Cardiology

## 2015-12-21 ENCOUNTER — Telehealth: Payer: Self-pay | Admitting: Family Medicine

## 2015-12-21 NOTE — Telephone Encounter (Signed)
Returned patients phone call to cx and reschedule an appointment. Lvm for patient to call the office back

## 2015-12-25 ENCOUNTER — Ambulatory Visit: Payer: Self-pay | Admitting: Family Medicine

## 2015-12-31 ENCOUNTER — Other Ambulatory Visit: Payer: Self-pay | Admitting: Family Medicine

## 2016-01-01 ENCOUNTER — Encounter: Payer: Self-pay | Admitting: Cardiology

## 2016-01-04 LAB — CUP PACEART REMOTE DEVICE CHECK
Battery Remaining Longevity: 149 mo
Brady Statistic AP VP Percent: 0 %
Brady Statistic AP VS Percent: 91 %
Brady Statistic AS VS Percent: 9 %
Date Time Interrogation Session: 20170916151211
Implantable Lead Implant Date: 20160701
Implantable Lead Location: 753859
Lead Channel Impedance Value: 501 Ohm
Lead Channel Pacing Threshold Pulse Width: 0.4 ms
Lead Channel Pacing Threshold Pulse Width: 0.4 ms
Lead Channel Setting Pacing Amplitude: 2.5 V
Lead Channel Setting Sensing Sensitivity: 4 mV
MDC IDC LEAD IMPLANT DT: 20160701
MDC IDC LEAD LOCATION: 753860
MDC IDC MSMT BATTERY IMPEDANCE: 111 Ohm
MDC IDC MSMT BATTERY VOLTAGE: 2.79 V
MDC IDC MSMT LEADCHNL RA PACING THRESHOLD AMPLITUDE: 0.75 V
MDC IDC MSMT LEADCHNL RV IMPEDANCE VALUE: 533 Ohm
MDC IDC MSMT LEADCHNL RV PACING THRESHOLD AMPLITUDE: 0.75 V
MDC IDC MSMT LEADCHNL RV SENSING INTR AMPL: 8 mV
MDC IDC SET LEADCHNL RA PACING AMPLITUDE: 1.5 V
MDC IDC SET LEADCHNL RV PACING PULSEWIDTH: 0.4 ms
MDC IDC STAT BRADY AS VP PERCENT: 0 %

## 2016-01-18 ENCOUNTER — Telehealth: Payer: Self-pay | Admitting: Family Medicine

## 2016-01-18 NOTE — Telephone Encounter (Signed)
Relation to [id number] Call back number: Pharmacy:  Reason for call:  Patient requesting refill furosemide (LASIX) 20 MG tablet

## 2016-01-19 MED ORDER — FUROSEMIDE 20 MG PO TABS: ORAL_TABLET | ORAL | 5 refills | Status: DC

## 2016-01-19 NOTE — Telephone Encounter (Signed)
Sent in and pt informed.

## 2016-01-28 ENCOUNTER — Other Ambulatory Visit: Payer: Self-pay | Admitting: Family Medicine

## 2016-01-28 ENCOUNTER — Ambulatory Visit: Payer: Self-pay | Admitting: Family Medicine

## 2016-02-01 ENCOUNTER — Ambulatory Visit (INDEPENDENT_AMBULATORY_CARE_PROVIDER_SITE_OTHER): Payer: Medicare Other | Admitting: Family Medicine

## 2016-02-01 ENCOUNTER — Encounter: Payer: Self-pay | Admitting: Family Medicine

## 2016-02-01 VITALS — BP 142/62 | HR 67 | Temp 98.0°F | Ht 61.0 in | Wt 109.0 lb

## 2016-02-01 DIAGNOSIS — I10 Essential (primary) hypertension: Secondary | ICD-10-CM | POA: Diagnosis not present

## 2016-02-01 DIAGNOSIS — R739 Hyperglycemia, unspecified: Secondary | ICD-10-CM | POA: Insufficient documentation

## 2016-02-01 DIAGNOSIS — E782 Mixed hyperlipidemia: Secondary | ICD-10-CM

## 2016-02-01 DIAGNOSIS — E559 Vitamin D deficiency, unspecified: Secondary | ICD-10-CM

## 2016-02-01 DIAGNOSIS — G8929 Other chronic pain: Secondary | ICD-10-CM

## 2016-02-01 DIAGNOSIS — M810 Age-related osteoporosis without current pathological fracture: Secondary | ICD-10-CM

## 2016-02-01 DIAGNOSIS — M545 Low back pain: Secondary | ICD-10-CM

## 2016-02-01 DIAGNOSIS — I4891 Unspecified atrial fibrillation: Secondary | ICD-10-CM

## 2016-02-01 DIAGNOSIS — K5900 Constipation, unspecified: Secondary | ICD-10-CM

## 2016-02-01 HISTORY — DX: Hyperglycemia, unspecified: R73.9

## 2016-02-01 LAB — CBC
HCT: 39.1 % (ref 36.0–46.0)
HEMOGLOBIN: 13.2 g/dL (ref 12.0–15.0)
MCHC: 33.8 g/dL (ref 30.0–36.0)
MCV: 88.5 fl (ref 78.0–100.0)
PLATELETS: 302 10*3/uL (ref 150.0–400.0)
RBC: 4.42 Mil/uL (ref 3.87–5.11)
RDW: 14.2 % (ref 11.5–15.5)
WBC: 8.2 10*3/uL (ref 4.0–10.5)

## 2016-02-01 LAB — LIPID PANEL
Cholesterol: 191 mg/dL (ref 0–200)
HDL: 76.8 mg/dL (ref >39.00)
LDL Cholesterol: 102 mg/dL — ABNORMAL HIGH (ref 0–99)
NONHDL: 114.63
Total CHOL/HDL Ratio: 2
Triglycerides: 64 mg/dL (ref 0.0–149.0)
VLDL: 12.8 mg/dL (ref 0.0–40.0)

## 2016-02-01 LAB — VITAMIN D 25 HYDROXY (VIT D DEFICIENCY, FRACTURES): VITD: 25.96 ng/mL — ABNORMAL LOW (ref 30.00–100.00)

## 2016-02-01 LAB — COMPREHENSIVE METABOLIC PANEL
ALK PHOS: 74 U/L (ref 39–117)
ALT: 13 U/L (ref 0–35)
AST: 20 U/L (ref 0–37)
Albumin: 4.3 g/dL (ref 3.5–5.2)
BILIRUBIN TOTAL: 0.5 mg/dL (ref 0.2–1.2)
BUN: 14 mg/dL (ref 6–23)
CO2: 29 mEq/L (ref 19–32)
Calcium: 10.2 mg/dL (ref 8.4–10.5)
Chloride: 103 mEq/L (ref 96–112)
Creatinine, Ser: 0.69 mg/dL (ref 0.40–1.20)
GFR: 84.93 mL/min (ref >60.00)
GLUCOSE: 97 mg/dL (ref 70–99)
Potassium: 4.5 mEq/L (ref 3.5–5.1)
SODIUM: 139 meq/L (ref 135–145)
TOTAL PROTEIN: 7.8 g/dL (ref 6.0–8.3)

## 2016-02-01 LAB — HEMOGLOBIN A1C: HEMOGLOBIN A1C: 5.7 % (ref 4.6–6.5)

## 2016-02-01 LAB — TSH: TSH: 3.11 u[IU]/mL (ref 0.35–4.50)

## 2016-02-01 MED ORDER — POTASSIUM CHLORIDE CRYS ER 10 MEQ PO TBCR: 10.0000 meq | EXTENDED_RELEASE_TABLET | Freq: Every day | ORAL | 5 refills | Status: DC | PRN

## 2016-02-01 MED ORDER — DILTIAZEM HCL ER BEADS 240 MG PO CP24: ORAL_CAPSULE | ORAL | 1 refills | Status: DC

## 2016-02-01 MED ORDER — METOPROLOL SUCCINATE ER 25 MG PO TB24: ORAL_TABLET | ORAL | 1 refills | Status: DC

## 2016-02-01 NOTE — Patient Instructions (Signed)
Ginger for stomach  Hypertension Hypertension, commonly called high blood pressure, is when the force of blood pumping through your arteries is too strong. Your arteries are the blood vessels that carry blood from your heart throughout your body. A blood pressure reading consists of a higher number over a lower number, such as 110/72. The higher number (systolic) is the pressure inside your arteries when your heart pumps. The lower number (diastolic) is the pressure inside your arteries when your heart relaxes. Ideally you want your blood pressure below 120/80. Hypertension forces your heart to work harder to pump blood. Your arteries may become narrow or stiff. Having untreated or uncontrolled hypertension can cause heart attack, stroke, kidney disease, and other problems. RISK FACTORS Some risk factors for high blood pressure are controllable. Others are not.  Risk factors you cannot control include:   Race. You may be at higher risk if you are African American.  Age. Risk increases with age.  Gender. Men are at higher risk than women before age 13 years. After age 6, women are at higher risk than men. Risk factors you can control include:  Not getting enough exercise or physical activity.  Being overweight.  Getting too much fat, sugar, calories, or salt in your diet.  Drinking too much alcohol. SIGNS AND SYMPTOMS Hypertension does not usually cause signs or symptoms. Extremely high blood pressure (hypertensive crisis) may cause headache, anxiety, shortness of breath, and nosebleed. DIAGNOSIS To check if you have hypertension, your health care provider will measure your blood pressure while you are seated, with your arm held at the level of your heart. It should be measured at least twice using the same arm. Certain conditions can cause a difference in blood pressure between your right and left arms. A blood pressure reading that is higher than normal on one occasion does not mean that  you need treatment. If it is not clear whether you have high blood pressure, you may be asked to return on a different day to have your blood pressure checked again. Or, you may be asked to monitor your blood pressure at home for 1 or more weeks. TREATMENT Treating high blood pressure includes making lifestyle changes and possibly taking medicine. Living a healthy lifestyle can help lower high blood pressure. You may need to change some of your habits. Lifestyle changes may include:  Following the DASH diet. This diet is high in fruits, vegetables, and whole grains. It is low in salt, red meat, and added sugars.  Keep your sodium intake below 2,300 mg per day.  Getting at least 30-45 minutes of aerobic exercise at least 4 times per week.  Losing weight if necessary.  Not smoking.  Limiting alcoholic beverages.  Learning ways to reduce stress. Your health care provider may prescribe medicine if lifestyle changes are not enough to get your blood pressure under control, and if one of the following is true:  You are 35-84 years of age and your systolic blood pressure is above 140.  You are 23 years of age or older, and your systolic blood pressure is above 150.  Your diastolic blood pressure is above 90.  You have diabetes, and your systolic blood pressure is over [id number] or your diastolic blood pressure is over 90.  You have kidney disease and your blood pressure is above 140/90.  You have heart disease and your blood pressure is above 140/90. Your personal target blood pressure may vary depending on your medical conditions, your age, and other  factors. HOME CARE INSTRUCTIONS  Have your blood pressure rechecked as directed by your health care provider.   Take medicines only as directed by your health care provider. Follow the directions carefully. Blood pressure medicines must be taken as prescribed. The medicine does not work as well when you skip doses. Skipping doses also puts you at  risk for problems.  Do not smoke.   Monitor your blood pressure at home as directed by your health care provider. SEEK MEDICAL CARE IF:   You think you are having a reaction to medicines taken.  You have recurrent headaches or feel dizzy.  You have swelling in your ankles.  You have trouble with your vision. SEEK IMMEDIATE MEDICAL CARE IF:  You develop a severe headache or confusion.  You have unusual weakness, numbness, or feel faint.  You have severe chest or abdominal pain.  You vomit repeatedly.  You have trouble breathing. MAKE SURE YOU:   Understand these instructions.  Will watch your condition.  Will get help right away if you are not doing well or get worse.   This information is not intended to replace advice given to you by your health care provider. Make sure you discuss any questions you have with your health care provider.   Document Released: 03/14/2005 Document Revised: 07/29/2014 Document Reviewed: 01/04/2013 Elsevier Interactive Patient Education [employer].

## 2016-02-01 NOTE — Assessment & Plan Note (Signed)
Encouraged to get adequate exercise, calcium and vitamin d intake 

## 2016-02-01 NOTE — Progress Notes (Signed)
Pre visit review using our clinic review tool, if applicable. No additional management support is needed unless otherwise documented below in the visit note. 

## 2016-02-02 ENCOUNTER — Other Ambulatory Visit: Payer: Self-pay | Admitting: Family Medicine

## 2016-02-02 MED ORDER — VITAMIN D (ERGOCALCIFEROL) 1.25 MG (50000 UNIT) PO CAPS: 50000.0000 [IU] | ORAL_CAPSULE | ORAL | 4 refills | Status: DC

## 2016-02-07 NOTE — Progress Notes (Signed)
Patient ID: Beth [name], female   DOB: 1923-08-18, 82 y.o.   MRN: [id number]   Subjective:    Patient ID: Beth [name], female    DOB: 1923/12/02, 82 y.o.   MRN: [id number]  Chief Complaint  Patient presents with  . Follow-up    HPI Patient is in today for follow up. She feels well today but has noted some recent flares in low back pain. No falls or trauma. No new incontinence or radicular symptoms. No recent hospitalizations or acute illness. She endorses some anhedonia but no suicidal ideation. Denies CP/palp/SOB/HA/congestion/fevers/GI or GU c/o. Taking meds as prescribed  Past Medical History:  Diagnosis Date  . Abdominal pain, unspecified site 08/17/2012  . Allergic state 06/22/2013  . Allergy   . Arthritis   . Atrial fibrillation with rapid ventricular response ([place])   . COPD (chronic obstructive pulmonary disease) ([place])   . Glaucoma 07/06/2014  . Hyperglycemia 02/01/2016  . Hypertension   . Increased urinary frequency 01/02/2014  . Loss of weight 01/19/2013  . Lung nodule    CT of chest 2007-left loewr lobar mass- evaluated by pulmonary- patient refused further work up  . Osteoporosis   . Other and unspecified hyperlipidemia 08/17/2012  . Syncope    fall  . Systolic CHF ([place])    EF [id number] 09/2014  . Tobacco abuse   . Vertigo     Past Surgical History:  Procedure Laterality Date  . ABDOMINAL HYSTERECTOMY    . APPENDECTOMY    . CESAREAN SECTION    . CHOLECYSTECTOMY    . EP IMPLANTABLE DEVICE N/A 09/26/2014   Procedure: Pacemaker Implant;  Surgeon: Evans Lance, MD;  Location: [facility];  Service: Cardiovascular;  Laterality: N/A;  . HEMORRHOID SURGERY    . HERNIA REPAIR    . THORACENTESIS Right 01/07/15   Exudative Effusion Lymphocyte predominant    Family History  Problem Relation Age of Onset  . Heart disease Mother   . Hypertension Mother   . Diabetes Mother   . Diabetes    . Cancer      lung- in distant releatives  . Cancer Brother   .  Lupus Brother   . Stroke Mother   . Cholelithiasis Father   . Nephrolithiasis Father     Social History   Social History  . Marital status: Widowed    Spouse name: N/A  . Number of children: N/A  . Years of education: N/A   Occupational History  . Not on file.   Social History Main Topics  . Smoking status: Former Smoker    Packs/day: 0.10    Years: 60.00    Types: Cigarettes  . Smokeless tobacco: Never Used     Comment: patient has stopped smoking on September 25, 2014  . Alcohol use No  . Drug use: No  . Sexual activity: No   Other Topics Concern  . Not on file   Social History Narrative   [facility]:   Patient is originally from [place]. She is lived in [place],, [place], [place], [place], and [place]. She moved to [place] in 2006. 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 No pet exposure. Remote bird exposure. No mold exposure.    Outpatient Medications Prior to Visit  Medication Sig Dispense Refill  . aspirin 81 MG chewable tablet Chew 81 mg by mouth 3 (three) times a week.    . calcium-vitamin D (OSCAL WITH  D) 500-200 MG-UNIT per tablet Take 1 tablet by mouth 3 (three) times a week.     . cetirizine (ZYRTEC) 10 MG tablet Take 10 mg by mouth every morning.    . fish oil-omega-3 fatty acids 1000 MG capsule Take 2 g by mouth daily at 12 noon.     . flecainide (TAMBOCOR) 50 MG tablet TAKE 1 TABLET BY MOUTH EVERY 12 HOURS 60 tablet 5  . furosemide (LASIX) 20 MG tablet TAKE 1 TABLET (20 MG TOTAL) BY MOUTH DAILY AS NEEDED FOR EDEMA. 20 tablet 5  . Multiple Vitamin (MULTIVITAMIN) tablet Take 1 tablet by mouth daily at 12 noon.     . Probiotic Product (PROBIOTIC ADVANCED) CAPS Take 1 capsule by mouth daily.    . Saline (SODIUM CHLORIDE) 0.65 % SOLN Place 1 spray into the nose as needed for congestion. 2 sprays each nostril once daily as needed    . triamcinolone cream (KENALOG) 0.1 % Apply 1 application  topically 2 (two) times daily. Use as needed for rash on leg 30 g 1  . Wheat Dextrin (BENEFIBER PO) Take 1 capsule by mouth daily.     Marland Kitchen diltiazem (CARDIZEM CD) 240 MG 24 hr capsule TAKE ONE CAPSULE BY MOUTH EVERY DAY 30 capsule 6  . diltiazem (TIAZAC) 240 MG 24 hr capsule TAKE 1 CAPSULE (240 MG TOTAL) BY MOUTH DAILY. 90 capsule 0  . flecainide (TAMBOCOR) 50 MG tablet TAKE 1 TABLET (50 MG TOTAL) BY MOUTH EVERY 12 (TWELVE) HOURS. 60 tablet 5  . metoprolol succinate (TOPROL-XL) 25 MG 24 hr tablet TAKE 1 TABLET (25 MG TOTAL) BY MOUTH DAILY. 90 tablet 1  . potassium chloride (K-DUR,KLOR-CON) 10 MEQ tablet Take 10 mEq by mouth daily as needed (for when you take Lasix).     No facility-administered medications prior to visit.     Allergies  Allergen Reactions  . Penicillins Shortness Of Breath, Diarrhea and Other (See Comments)    Stomach cramping  . Ciprofloxacin Other (See Comments)    myalgias  . Fexofenadine Other (See Comments)    unknown    Review of Systems  Constitutional: Positive for malaise/fatigue. Negative for fever.  HENT: Negative for congestion.   Eyes: Negative for blurred vision.  Respiratory: Negative for shortness of breath.   Cardiovascular: Negative for chest pain, palpitations and leg swelling.  Gastrointestinal: Negative for abdominal pain, blood in stool and nausea.  Genitourinary: Negative for dysuria and frequency.  Musculoskeletal: Positive for back pain. Negative for falls.  Skin: Negative for rash.  Neurological: Negative for dizziness, loss of consciousness and headaches.  Endo/Heme/Allergies: Negative for environmental allergies.  Psychiatric/Behavioral: Positive for depression. The patient is not nervous/anxious.        Objective:    Physical Exam  Constitutional: She is oriented to person, place, and time. She appears well-developed and well-nourished. No distress.  HENT:  Head: Normocephalic and atraumatic.  Nose: Nose normal.  Eyes: Right eye  exhibits no discharge. Left eye exhibits no discharge.  Neck: Normal range of motion. Neck supple.  Cardiovascular: Normal rate.   Pulmonary/Chest: Effort normal and breath sounds normal.  Abdominal: Soft. Bowel sounds are normal. There is no tenderness.  Musculoskeletal: She exhibits no edema.  Neurological: She is alert and oriented to person, place, and time.  Skin: Skin is warm and dry.  Psychiatric: She has a normal mood and affect.  Nursing note and vitals reviewed.   BP (!) 142/62 (BP Location: Left Arm, Patient Position: Sitting, Cuff Size:  Normal)   Pulse 67   Temp 98 F (36.7 C) (Oral)   Ht 5\' 1"  (1.549 m)   Wt 109 lb (49.4 kg)   SpO2 98%   BMI 20.60 kg/m  Wt Readings from Last 3 Encounters:  02/01/16 109 lb (49.4 kg)  10/14/15 109 lb 3.2 oz (49.5 kg)  09/15/15 109 lb (49.4 kg)     Lab Results  Component Value Date   WBC 8.2 02/01/2016   HGB 13.2 02/01/2016   HCT 39.1 02/01/2016   PLT 302.0 02/01/2016   GLUCOSE 97 02/01/2016   CHOL 191 02/01/2016   TRIG 64.0 02/01/2016   HDL 76.80 02/01/2016   LDLDIRECT 127.9 10/05/2011   LDLCALC 102 (H) 02/01/2016   ALT 13 02/01/2016   AST 20 02/01/2016   NA 139 02/01/2016   K 4.5 02/01/2016   CL 103 02/01/2016   CREATININE 0.69 02/01/2016   BUN 14 02/01/2016   CO2 29 02/01/2016   TSH 3.11 02/01/2016   INR 1.27 09/27/2014   HGBA1C 5.7 02/01/2016    Lab Results  Component Value Date   TSH 3.11 02/01/2016   Lab Results  Component Value Date   WBC 8.2 02/01/2016   HGB 13.2 02/01/2016   HCT 39.1 02/01/2016   MCV 88.5 02/01/2016   PLT 302.0 02/01/2016   Lab Results  Component Value Date   NA 139 02/01/2016   K 4.5 02/01/2016   CO2 29 02/01/2016   GLUCOSE 97 02/01/2016   BUN 14 02/01/2016   CREATININE 0.69 02/01/2016   BILITOT 0.5 02/01/2016   ALKPHOS 74 02/01/2016   AST 20 02/01/2016   ALT 13 02/01/2016   PROT 7.8 02/01/2016   ALBUMIN 4.3 02/01/2016   CALCIUM 10.2 02/01/2016   ANIONGAP 9 01/09/2015    GFR 84.93 02/01/2016   Lab Results  Component Value Date   CHOL 191 02/01/2016   Lab Results  Component Value Date   HDL 76.80 02/01/2016   Lab Results  Component Value Date   LDLCALC 102 (H) 02/01/2016   Lab Results  Component Value Date   TRIG 64.0 02/01/2016   Lab Results  Component Value Date   CHOLHDL 2 02/01/2016   Lab Results  Component Value Date   HGBA1C 5.7 02/01/2016       Assessment & Plan:   Problem List Items Addressed This Visit    Vitamin D deficiency - Primary    Encouraged to get adequate exercise, calcium and vitamin d intake      Relevant Orders   VITAMIN D 25 Hydroxy (Vit-D Deficiency, Fractures) (Completed)   Essential hypertension   Relevant Medications   metoprolol succinate (TOPROL-XL) 25 MG 24 hr tablet   diltiazem (TIAZAC) 240 MG 24 hr capsule   Other Relevant Orders   CBC (Completed)   TSH (Completed)   Comprehensive metabolic panel (Completed)   Osteoporosis    Encouraged to get adequate exercise, calcium and vitamin d intake      Relevant Orders   VITAMIN D 25 Hydroxy (Vit-D Deficiency, Fractures) (Completed)   Hyperlipidemia    Encouraged heart healthy diet, increase exercise, avoid trans fats, consider a krill oil cap daily      Relevant Medications   metoprolol succinate (TOPROL-XL) 25 MG 24 hr tablet   diltiazem (TIAZAC) 240 MG 24 hr capsule   Other Relevant Orders   Lipid panel (Completed)   Constipation   Back pain    Encouraged moist heat and gentle stretching as tolerated. May try  NSAIDs and prescription meds as directed and report if symptoms worsen or seek immediate care. Try Lidocaine patches.      Atrial fibrillation ([place])    Doing well on Flecainide.       Relevant Medications   metoprolol succinate (TOPROL-XL) 25 MG 24 hr tablet   diltiazem (TIAZAC) 240 MG 24 hr capsule   Hyperglycemia    hgba1c acceptable, minimize simple carbs. Increase exercise as tolerated.       Relevant Orders    Hemoglobin A1c (Completed)      I have changed Ms. Banet's potassium chloride. I am also having her maintain her sodium chloride, multivitamin, fish oil-omega-3 fatty acids, cetirizine, calcium-vitamin D, Wheat Dextrin (BENEFIBER PO), PROBIOTIC ADVANCED, aspirin, triamcinolone cream, flecainide, furosemide, metoprolol succinate, and diltiazem.  Meds ordered this encounter  Medications  . metoprolol succinate (TOPROL-XL) 25 MG 24 hr tablet    Sig: TAKE 1 TABLET (25 MG TOTAL) BY MOUTH DAILY.    Dispense:  90 tablet    Refill:  1  . potassium chloride (K-DUR,KLOR-CON) 10 MEQ tablet    Sig: Take 1 tablet (10 mEq total) by mouth daily as needed (for when you take Lasix).    Dispense:  30 tablet    Refill:  5  . diltiazem (TIAZAC) 240 MG 24 hr capsule    Sig: TAKE 1 CAPSULE (240 MG TOTAL) BY MOUTH DAILY.    Dispense:  90 capsule    Refill:  1     Tunisia Landgrebe, MD

## 2016-02-07 NOTE — Assessment & Plan Note (Signed)
Encouraged heart healthy diet, increase exercise, avoid trans fats, consider a krill oil cap daily 

## 2016-02-07 NOTE — Assessment & Plan Note (Signed)
Doing well on Flecainide.

## 2016-02-07 NOTE — Assessment & Plan Note (Signed)
Encouraged moist heat and gentle stretching as tolerated. May try NSAIDs and prescription meds as directed and report if symptoms worsen or seek immediate care. Try Lidocaine patches.

## 2016-02-07 NOTE — Assessment & Plan Note (Signed)
hgba1c acceptable, minimize simple carbs. Increase exercise as tolerated.  

## 2016-02-07 NOTE — Assessment & Plan Note (Signed)
Encouraged to get adequate exercise, calcium and vitamin d intake 

## 2016-02-26 DIAGNOSIS — E876 Hypokalemia: Secondary | ICD-10-CM

## 2016-02-26 DIAGNOSIS — Z720 Tobacco use: Secondary | ICD-10-CM

## 2016-02-26 HISTORY — DX: Tobacco use: Z72.0

## 2016-02-26 HISTORY — DX: Hypokalemia: E87.6

## 2016-03-01 DIAGNOSIS — H40053 Ocular hypertension, bilateral: Secondary | ICD-10-CM | POA: Diagnosis not present

## 2016-03-01 DIAGNOSIS — H40033 Anatomical narrow angle, bilateral: Secondary | ICD-10-CM | POA: Diagnosis not present

## 2016-03-01 DIAGNOSIS — H40013 Open angle with borderline findings, low risk, bilateral: Secondary | ICD-10-CM | POA: Diagnosis not present

## 2016-03-09 ENCOUNTER — Emergency Department (HOSPITAL_COMMUNITY): Payer: Medicare Other

## 2016-03-09 ENCOUNTER — Encounter (HOSPITAL_COMMUNITY): Payer: Self-pay | Admitting: Adult Health

## 2016-03-09 ENCOUNTER — Inpatient Hospital Stay (HOSPITAL_COMMUNITY)
Admission: EM | Admit: 2016-03-09 | Discharge: 2016-03-13 | DRG: 292 | Disposition: A | Discharge status: Skilled Nursing Facility | Payer: Medicare Other | Attending: Internal Medicine | Admitting: Internal Medicine

## 2016-03-09 DIAGNOSIS — Z87891 Personal history of nicotine dependence: Secondary | ICD-10-CM | POA: Diagnosis not present

## 2016-03-09 DIAGNOSIS — I4891 Unspecified atrial fibrillation: Secondary | ICD-10-CM | POA: Diagnosis not present

## 2016-03-09 DIAGNOSIS — Z823 Family history of stroke: Secondary | ICD-10-CM

## 2016-03-09 DIAGNOSIS — R069 Unspecified abnormalities of breathing: Secondary | ICD-10-CM | POA: Diagnosis not present

## 2016-03-09 DIAGNOSIS — I110 Hypertensive heart disease with heart failure: Secondary | ICD-10-CM | POA: Diagnosis not present

## 2016-03-09 DIAGNOSIS — Z9049 Acquired absence of other specified parts of digestive tract: Secondary | ICD-10-CM

## 2016-03-09 DIAGNOSIS — I5043 Acute on chronic combined systolic (congestive) and diastolic (congestive) heart failure: Secondary | ICD-10-CM | POA: Diagnosis not present

## 2016-03-09 DIAGNOSIS — R918 Other nonspecific abnormal finding of lung field: Secondary | ICD-10-CM | POA: Diagnosis not present

## 2016-03-09 DIAGNOSIS — Z950 Presence of cardiac pacemaker: Secondary | ICD-10-CM

## 2016-03-09 DIAGNOSIS — Z7982 Long term (current) use of aspirin: Secondary | ICD-10-CM

## 2016-03-09 DIAGNOSIS — I10 Essential (primary) hypertension: Secondary | ICD-10-CM | POA: Diagnosis present

## 2016-03-09 DIAGNOSIS — F329 Major depressive disorder, single episode, unspecified: Secondary | ICD-10-CM | POA: Diagnosis present

## 2016-03-09 DIAGNOSIS — S93402A Sprain of unspecified ligament of left ankle, initial encounter: Secondary | ICD-10-CM | POA: Diagnosis present

## 2016-03-09 DIAGNOSIS — T501X5A Adverse effect of loop [high-ceiling] diuretics, initial encounter: Secondary | ICD-10-CM | POA: Diagnosis not present

## 2016-03-09 DIAGNOSIS — Z6820 Body mass index (BMI) 20.0-20.9, adult: Secondary | ICD-10-CM

## 2016-03-09 DIAGNOSIS — Z90710 Acquired absence of both cervix and uterus: Secondary | ICD-10-CM

## 2016-03-09 DIAGNOSIS — Z880 Allergy status to penicillin: Secondary | ICD-10-CM

## 2016-03-09 DIAGNOSIS — S99922A Unspecified injury of left foot, initial encounter: Secondary | ICD-10-CM | POA: Diagnosis not present

## 2016-03-09 DIAGNOSIS — Z66 Do not resuscitate: Secondary | ICD-10-CM | POA: Diagnosis not present

## 2016-03-09 DIAGNOSIS — J441 Chronic obstructive pulmonary disease with (acute) exacerbation: Secondary | ICD-10-CM | POA: Diagnosis present

## 2016-03-09 DIAGNOSIS — H409 Unspecified glaucoma: Secondary | ICD-10-CM | POA: Diagnosis present

## 2016-03-09 DIAGNOSIS — R296 Repeated falls: Secondary | ICD-10-CM | POA: Diagnosis present

## 2016-03-09 DIAGNOSIS — Z833 Family history of diabetes mellitus: Secondary | ICD-10-CM

## 2016-03-09 DIAGNOSIS — Z881 Allergy status to other antibiotic agents status: Secondary | ICD-10-CM

## 2016-03-09 DIAGNOSIS — R0602 Shortness of breath: Secondary | ICD-10-CM | POA: Diagnosis not present

## 2016-03-09 DIAGNOSIS — I272 Pulmonary hypertension, unspecified: Secondary | ICD-10-CM | POA: Diagnosis not present

## 2016-03-09 DIAGNOSIS — Z8249 Family history of ischemic heart disease and other diseases of the circulatory system: Secondary | ICD-10-CM

## 2016-03-09 DIAGNOSIS — E876 Hypokalemia: Secondary | ICD-10-CM | POA: Diagnosis not present

## 2016-03-09 DIAGNOSIS — Z888 Allergy status to other drugs, medicaments and biological substances status: Secondary | ICD-10-CM

## 2016-03-09 DIAGNOSIS — R0600 Dyspnea, unspecified: Secondary | ICD-10-CM | POA: Diagnosis present

## 2016-03-09 DIAGNOSIS — W1830XA Fall on same level, unspecified, initial encounter: Secondary | ICD-10-CM | POA: Diagnosis present

## 2016-03-09 DIAGNOSIS — E785 Hyperlipidemia, unspecified: Secondary | ICD-10-CM | POA: Diagnosis present

## 2016-03-09 DIAGNOSIS — Z79899 Other long term (current) drug therapy: Secondary | ICD-10-CM

## 2016-03-09 DIAGNOSIS — R0609 Other forms of dyspnea: Secondary | ICD-10-CM

## 2016-03-09 LAB — BASIC METABOLIC PANEL
Anion gap: 10 (ref 5–15)
BUN: 15 mg/dL (ref 6–20)
CO2: 24 mmol/L (ref 22–32)
Calcium: 9.4 mg/dL (ref 8.9–10.3)
Chloride: 102 mmol/L (ref 101–111)
Creatinine, Ser: 0.74 mg/dL (ref 0.44–1.00)
GFR calc Af Amer: >60 mL/min (ref >60)
GFR calc non Af Amer: >60 mL/min (ref >60)
Glucose, Bld: 126 mg/dL — ABNORMAL HIGH (ref 65–99)
Potassium: 4.1 mmol/L (ref 3.5–5.1)
Sodium: 136 mmol/L (ref 135–145)

## 2016-03-09 LAB — CBC
HCT: 34.9 % — ABNORMAL LOW (ref 36.0–46.0)
Hemoglobin: 11.9 g/dL — ABNORMAL LOW (ref 12.0–15.0)
MCH: 30.2 pg (ref 26.0–34.0)
MCHC: 34.1 g/dL (ref 30.0–36.0)
MCV: 88.6 fL (ref 78.0–100.0)
Platelets: 260 10*3/uL (ref 150–400)
RBC: 3.94 MIL/uL (ref 3.87–5.11)
RDW: 13.4 % (ref 11.5–15.5)
WBC: 9.2 10*3/uL (ref 4.0–10.5)

## 2016-03-09 LAB — I-STAT TROPONIN, ED: Troponin i, poc: 0.01 ng/mL (ref 0.00–0.08)

## 2016-03-09 MED ORDER — IOPAMIDOL (ISOVUE-300) INJECTION 61%
INTRAVENOUS | Status: AC
  Administered 2016-03-09: 75 mL
  Filled 2016-03-09: qty 75

## 2016-03-09 MED ORDER — FUROSEMIDE 10 MG/ML IJ SOLN
20.0000 mg | Freq: Once | INTRAMUSCULAR | Status: AC
  Administered 2016-03-09: 20 mg via INTRAVENOUS
  Filled 2016-03-09: qty 2

## 2016-03-09 MED ORDER — PREDNISONE 20 MG PO TABS
40.0000 mg | ORAL_TABLET | Freq: Once | ORAL | Status: AC
  Administered 2016-03-09: 40 mg via ORAL
  Filled 2016-03-09: qty 2

## 2016-03-09 MED ORDER — IPRATROPIUM-ALBUTEROL 0.5-2.5 (3) MG/3ML IN SOLN
3.0000 mL | Freq: Once | RESPIRATORY_TRACT | Status: AC
  Administered 2016-03-09: 3 mL via RESPIRATORY_TRACT
  Filled 2016-03-09: qty 3

## 2016-03-09 NOTE — ED Notes (Signed)
Patient transported to CT 

## 2016-03-09 NOTE — ED Provider Notes (Signed)
[facility] DEPT Provider Note   CSN: [id number] Arrival date & time: 03/09/16  1941  By signing my name below, I, Reola Mosher, attest that this documentation has been prepared under the direction and in the presence of Virgel Manifold, MD. Electronically Signed: Reola Mosher, ED Scribe. 03/09/16. 9:08 PM.  History   Chief Complaint Chief Complaint  Patient presents with  . Shortness of Breath   The history is provided by the patient, a relative and the EMS personnel. No language interpreter was used.    HPI Comments: Beth [name] is a 82 y.o. female BIB EMS, with a PMHx of COPD, CHF, pacemaker placement, and pulmonary HTN, who presents to the Emergency Department complaining of gradually improving, persistent shortness of breath onset approximately one week ago. She reports associated dry cough, fatigue, mild right-sided abdominal pain, and abdominal distension secondary to her shortness of breath. Her daughter additionally notes that she has been increasingly wheezing over the past day as well. Pt has a h/o pleural effusion and notes that her symptoms today are similar to this. Her shortness of breath is exacerbated with laying supine and with mild exertion.  Pt is also prescribed a diuretic to use PRN at home, and she last took this medication approximately 6 days ago. Baseline weight around 107 pounds. 108.2 yesterday. She did not weigh herself today because her L foot was hurting. She denies chest pain, leg swelling, or any other associated symptoms.    Pt additionally notes that she sustained a ground-level mechanical fall yesterday. During this, she states that she fell backwards onto her left side with her left leg beneath her, sustaining immediate pain to the left foot. No LOC or other reported injuries. She states that ambulation and weight bearing will exacerbated her foot pain. She has been minimally ambulatory since this fall secondary to pain.   Past Medical  History:  Diagnosis Date  . Abdominal pain, unspecified site 08/17/2012  . Allergic state 06/22/2013  . Allergy   . Arthritis   . Atrial fibrillation with rapid ventricular response ([place])   . COPD (chronic obstructive pulmonary disease) ([place])   . Glaucoma 07/06/2014  . Hyperglycemia 02/01/2016  . Hypertension   . Increased urinary frequency 01/02/2014  . Loss of weight 01/19/2013  . Lung nodule    CT of chest 2007-left loewr lobar mass- evaluated by pulmonary- patient refused further work up  . Osteoporosis   . Other and unspecified hyperlipidemia 08/17/2012  . Syncope    fall  . Systolic CHF ([place])    EF [id number] 09/2014  . Tobacco abuse   . Vertigo    Patient Active Problem List   Diagnosis Date Noted  . Hyperglycemia 02/01/2016  . Pleural effusion 06/11/2015  . Atrial fibrillation ([place]) 01/06/2015  . Pulmonary nodule 01/06/2015  . CHF (congestive heart failure) ([place]) 01/06/2015  . CHF exacerbation ([place]) 01/05/2015  . Pacemaker 12/23/2014  . Chest pain 11/17/2014  . Chronic systolic heart failure ([place]) 09/28/2014  . Back pain   . Sinus brady-tachy syndrome ([place]) 09/26/2014  . Solitary pulmonary nodule 09/26/2014  . Pulmonary HTN 09/26/2014  . Glaucoma 07/06/2014  . Depression 04/20/2014  . Increased urinary frequency 01/02/2014  . Atypical chest pain 09/03/2013  . Constipation 09/03/2013  . Weakness 07/13/2013  . Protein-calorie malnutrition, severe ([place]) 06/30/2013  . Syncope 06/28/2013  . Allergic state 06/22/2013  . Facial skin lesion 06/22/2013  . Loss of weight 01/19/2013  . Abdominal pain 08/17/2012  . Hyperlipidemia  08/17/2012  . Aortic insufficiency 09/22/2011  . Dystrophic nail 05/14/2011  . Neck pain 03/08/2011  . TMJ syndrome 03/08/2011  . Fatigue 02/11/2011  . Alopecia 10/16/2010  . Osteoporosis 07/08/2008  . LUNG NODULE 06/03/2008  . Vitamin D deficiency 10/30/2007  . MICROSCOPIC HEMATURIA 02/20/2007  . Essential hypertension 02/14/2007   Past  Surgical History:  Procedure Laterality Date  . ABDOMINAL HYSTERECTOMY    . APPENDECTOMY    . CESAREAN SECTION    . CHOLECYSTECTOMY    . EP IMPLANTABLE DEVICE N/A 09/26/2014   Procedure: Pacemaker Implant;  Surgeon: Evans Lance, MD;  Location: [facility];  Service: Cardiovascular;  Laterality: N/A;  . HEMORRHOID SURGERY    . HERNIA REPAIR    . THORACENTESIS Right 01/07/15   Exudative Effusion Lymphocyte predominant   OB History    No data available     Home Medications    Prior to Admission medications   Medication Sig Start Date End Date Taking? Authorizing Provider  aspirin 81 MG chewable tablet Chew 81 mg by mouth daily as needed (TAKES OCCASIONALLY).    Yes Historical Provider, MD  cetirizine (ZYRTEC) 10 MG tablet Take 10 mg by mouth every morning.   Yes Historical Provider, MD  diltiazem (TIAZAC) 240 MG 24 hr capsule TAKE 1 CAPSULE (240 MG TOTAL) BY MOUTH DAILY. 02/01/16  Yes Mosie Lukes, MD  fish oil-omega-3 fatty acids 1000 MG capsule Take 1 g by mouth daily at 12 noon.    Yes Historical Provider, MD  flecainide (TAMBOCOR) 50 MG tablet TAKE 1 TABLET BY MOUTH EVERY 12 HOURS 12/31/15  Yes Mosie Lukes, MD  furosemide (LASIX) 20 MG tablet TAKE 1 TABLET (20 MG TOTAL) BY MOUTH DAILY AS NEEDED FOR EDEMA. 01/19/16  Yes Mosie Lukes, MD  metoprolol succinate (TOPROL-XL) 25 MG 24 hr tablet TAKE 1 TABLET (25 MG TOTAL) BY MOUTH DAILY. 02/01/16  Yes Mosie Lukes, MD  Multiple Vitamin (MULTIVITAMIN) tablet Take 1 tablet by mouth daily at 12 noon.    Yes Historical Provider, MD  potassium chloride (K-DUR,KLOR-CON) 10 MEQ tablet Take 1 tablet (10 mEq total) by mouth daily as needed (for when you take Lasix). 02/01/16  Yes Mosie Lukes, MD  Probiotic Product (PROBIOTIC ADVANCED) CAPS Take 2 capsules by mouth daily.    Yes Historical Provider, MD   Family History Family History  Problem Relation Age of Onset  . Heart disease Mother   . Hypertension Mother   . Diabetes  Mother   . Diabetes    . Cancer      lung- in distant releatives  . Cancer Brother   . Lupus Brother   . Stroke Mother   . Cholelithiasis Father   . Nephrolithiasis Father    Social History Social History  Substance Use Topics  . Smoking status: Former Smoker    Packs/day: 0.10    Years: 60.00    Types: Cigarettes  . Smokeless tobacco: Never Used     Comment: patient has stopped smoking on September 25, 2014  . Alcohol use No   Allergies   Penicillins; Ciprofloxacin; and Fexofenadine  Review of Systems Review of Systems  Constitutional: Positive for fatigue. Negative for unexpected weight change.  Respiratory: Positive for cough, shortness of breath and wheezing.   Cardiovascular: Negative for chest pain and leg swelling.  Gastrointestinal: Positive for abdominal distention and abdominal pain (right sided).  Musculoskeletal: Positive for arthralgias (left foot) and myalgias.  Neurological: Negative  for syncope.  All other systems reviewed and are negative.  Physical Exam Updated Vital Signs BP 167/70 (BP Location: Right Arm)   Pulse 75   Temp 97.9 F (36.6 C) (Oral)   Resp 22   Ht 5\' 1"  (1.549 m)   Wt 108 lb 2 oz (49 kg)   SpO2 (S) 91% Comment: 99 on 2Liters  BMI 20.43 kg/m   Physical Exam  Constitutional: She appears well-developed and well-nourished.  HENT:  Head: Normocephalic.  Right Ear: External ear normal.  Left Ear: External ear normal.  Nose: Nose normal.  Mouth/Throat: Oropharynx is clear and moist.  Eyes: Conjunctivae are normal. Right eye exhibits no discharge. Left eye exhibits no discharge.  Neck: Normal range of motion.  Cardiovascular: Normal rate, regular rhythm and normal heart sounds.   No murmur heard. Pulmonary/Chest: Effort normal. No respiratory distress. She has no wheezes.  Crackles are noted in the bilateral bases.   Abdominal: Soft. She exhibits no distension. There is no tenderness. There is no rebound and no guarding.    Musculoskeletal: She exhibits edema and tenderness.  Mild swelling, ecchymosis, and tenderness to the left foot and ankle.   Neurological: She is alert. No cranial nerve deficit. Coordination normal.  Skin: Skin is warm and dry. No rash noted. No erythema.  Psychiatric: She has a normal mood and affect. Her behavior is normal.  Nursing note and vitals reviewed.  ED Treatments / Results  DIAGNOSTIC STUDIES: Oxygen Saturation is 99% on 2LO2NC, normal by my interpretation.   COORDINATION OF CARE: 9:08 PM-Discussed next steps with pt. Pt verbalized understanding and is agreeable with the plan.   Labs (all labs ordered are listed, but only abnormal results are displayed) Labs Reviewed  BASIC METABOLIC PANEL - Abnormal; Notable for the following:       Result Value   Glucose, Bld 126 (*)    All other components within normal limits  CBC - Abnormal; Notable for the following:    Hemoglobin 11.9 (*)    HCT 34.9 (*)    All other components within normal limits  BRAIN NATRIURETIC PEPTIDE  I-STAT TROPOININ, ED   EKG  EKG Interpretation  Date/Time:  Wednesday March 09 2016 19:58:35 EST Ventricular Rate:  71 PR Interval:    QRS Duration: 158 QT Interval:  469 QTC Calculation: 521 R Axis:   -77 Text Interpretation:  AV sequential or dual chamber electronic pacemaker Confirmed by Wilson Singer  MD, Eola Waldrep [phone]) on 03/09/2016 8:27:42 PM      Radiology Dg Chest 2 View  Result Date: 03/09/2016 CLINICAL DATA:  Shortness of breath EXAM: CHEST  2 VIEW COMPARISON:  Chest radiograph 07/29/2015 FINDINGS: Multiple pulmonary nodules are again seen, primarily within the left mid lung. The lungs are hyperinflated with diffusely increased pulmonary markings suggestive of COPD. Left chest wall pacemaker leads are in radiographically appropriate position. No pleural effusion or pneumothorax. No focal airspace consolidation or pulmonary edema. IMPRESSION: 1. COPD and multiple pulmonary nodules,  unchanged compared to 07/29/2015. The patient's most recent CT was 01/06/2015. A follow-up CT examination should be considered, as the appearance is concerning for metastatic disease. 2. No acute cardiopulmonary disease. Electronically Signed   By: Ulyses Jarred M.D.   On: 03/09/2016 22:02   Ct Chest W Contrast  Result Date: 03/10/2016 CLINICAL DATA:  82 y/o  F; multiple pulmonary nodules. EXAM: CT CHEST WITH CONTRAST TECHNIQUE: Multidetector CT imaging of the chest was performed during intravenous contrast administration. CONTRAST:  75 cc ISOVUE-300  IOPAMIDOL (ISOVUE-300) INJECTION 61% COMPARISON:  01/06/2015 CT chest.  03/09/2016 chest radiograph. FINDINGS: Cardiovascular: Normal caliber thoracic aorta. Extensive aortic atherosclerosis with both calcific and fibrofatty atherosclerosis. Normal caliber main pulmonary artery. Normal heart size. No pericardial effusion. Two lead pacemaker. Moderate coronary artery calcification. Mediastinum/Nodes: No enlarged mediastinal, hilar, or axillary lymph nodes. Thyroid gland, trachea, and esophagus demonstrate no significant findings. Lungs/Pleura: Trace right pleural effusion decreased from prior CT. There are numerous pulmonary nodules throughout the lungs (> than 20) which demonstrate course calcification throughout all lobes although more pronounced in the upper lung zones. In comparison with the prior CT of the chest some may be slightly enlarged for example in the superior segment of the left lower lobe (series 7, image 34) where the nodule measures 18 x 15 mm, previously 14 x 15 mm, however, this may also be due to differences in slice selection. Improved aeration within the long along the right lower mediastinum. Improved aeration of the right posterior lower lobe due to near complete resolution of pleural effusion. No definite new pulmonary nodule is identified. There is a background of centrilobular emphysema and septal thickening in the lung apices. Upper  Abdomen: Liver hypoattenuation with geographic areas of increased density at the peripheries probably representing steatosis and areas of focal sparing. Fluid attenuating partially visualized lesion within the gallbladder fossa of uncertain significance. Musculoskeletal: Stable T11 compression deformity. Vertebral body heights are otherwise preserved. No acute osseous abnormality is identified. IMPRESSION: 1. Numerous calcified pulmonary nodules are stable to minimally increased in size in comparison with the prior CT of the chest. Small differences in measurements may be due to differences in slice selection. No definite new pulmonary nodule. No mediastinal adenopathy. Findings may represent sequelae of granulomatous disease such as tuberculosis, histoplasmosis, or sarcoidosis. There is a upper lung predominance which may also be seen with occupational exposure. Metastatic disease is less likely given relative stability. 2. Diminished right pleural effusion with trace residual and improved aeration of the right lung. 3. Stable chronic T11 compression deformity. 4. Aortic and coronary artery atherosclerosis. 5. Partially visualized fluid attenuating structure within the gallbladder fossa. Given history of cholecystectomy this is of uncertain significance. Consider abdominal ultrasound for further characterization as clinically indicated. Electronically Signed   By: Kristine Garbe M.D.   On: 03/10/2016 00:03   Dg Foot Complete Left  Result Date: 03/09/2016 CLINICAL DATA:  Initial evaluation for acute trauma, fall. Edema about left foot EXAM: LEFT FOOT - COMPLETE 3+ VIEW COMPARISON:  None. FINDINGS: Severe osteopenia, somewhat limiting evaluation for possible subtle acute nondisplaced fractures. No acute fracture or dislocation. Joint spaces maintained. No appreciable soft tissue swelling identified. Mild vascular occasions noted posterior to the ankle. IMPRESSION: 1. No acute osseous abnormality about  the left foot. 2. Severe osteopenia. Electronically Signed   By: Jeannine Boga M.D.   On: 03/09/2016 22:00    Procedures Procedures   Medications Ordered in ED Medications - No data to display  Initial Impression / Assessment and Plan / ED Course  I have reviewed the triage vital signs and the nursing notes.  Pertinent labs & imaging results that were available during my care of the patient were reviewed by me and considered in my medical decision making (see chart for details).  Clinical Course    89yF with dyspnea. New oxygen requirement. COPD exacerbation? Some wheezing on exam. Possibly some component of HF. Doesn't seem floridly volume overloaded but she reports her weight is up a couple pounds from her  baseline around 107. She doesn't look distressed at rest but gets pretty with pretty minimal activity such as getting up on the bed pain.   Final Clinical Impressions(s) / ED Diagnoses   Final diagnoses:  COPD exacerbation ([place])  Other form of dyspnea   New Prescriptions New Prescriptions   No medications on file   I personally preformed the services scribed in my presence. The recorded information has been reviewed is accurate. Virgel Manifold, MD.        Virgel Manifold, MD 03/10/16 [phone]

## 2016-03-09 NOTE — ED Triage Notes (Signed)
Presents with one week of SOB, weakness and feeling shaky-endorses fall yesterday on tailbone and injujry to left foot. Left foot edematous. Pt states, "My breathing is crazy and I don't know what to do aboput that". She endorses fatigue with inspiration and difficulty getting air in. Alert and oriented. Clear upper respiration on right, with rales on left and diminished in left lower lobe.  Pt ius paced-RA oxygen 91%. SOB with short phrases when speaking.

## 2016-03-10 ENCOUNTER — Observation Stay (HOSPITAL_COMMUNITY): Payer: Medicare Other

## 2016-03-10 ENCOUNTER — Encounter (HOSPITAL_COMMUNITY): Payer: Self-pay

## 2016-03-10 DIAGNOSIS — Z7982 Long term (current) use of aspirin: Secondary | ICD-10-CM | POA: Diagnosis not present

## 2016-03-10 DIAGNOSIS — I509 Heart failure, unspecified: Secondary | ICD-10-CM | POA: Diagnosis not present

## 2016-03-10 DIAGNOSIS — R918 Other nonspecific abnormal finding of lung field: Secondary | ICD-10-CM

## 2016-03-10 DIAGNOSIS — Z950 Presence of cardiac pacemaker: Secondary | ICD-10-CM | POA: Diagnosis not present

## 2016-03-10 DIAGNOSIS — Z833 Family history of diabetes mellitus: Secondary | ICD-10-CM | POA: Diagnosis not present

## 2016-03-10 DIAGNOSIS — R41841 Cognitive communication deficit: Secondary | ICD-10-CM | POA: Diagnosis not present

## 2016-03-10 DIAGNOSIS — M6281 Muscle weakness (generalized): Secondary | ICD-10-CM | POA: Diagnosis not present

## 2016-03-10 DIAGNOSIS — S93402D Sprain of unspecified ligament of left ankle, subsequent encounter: Secondary | ICD-10-CM | POA: Diagnosis not present

## 2016-03-10 DIAGNOSIS — I272 Pulmonary hypertension, unspecified: Secondary | ICD-10-CM

## 2016-03-10 DIAGNOSIS — T501X5A Adverse effect of loop [high-ceiling] diuretics, initial encounter: Secondary | ICD-10-CM | POA: Diagnosis not present

## 2016-03-10 DIAGNOSIS — I5043 Acute on chronic combined systolic (congestive) and diastolic (congestive) heart failure: Secondary | ICD-10-CM | POA: Diagnosis not present

## 2016-03-10 DIAGNOSIS — W1830XA Fall on same level, unspecified, initial encounter: Secondary | ICD-10-CM | POA: Diagnosis present

## 2016-03-10 DIAGNOSIS — I4891 Unspecified atrial fibrillation: Secondary | ICD-10-CM | POA: Diagnosis present

## 2016-03-10 DIAGNOSIS — Z90710 Acquired absence of both cervix and uterus: Secondary | ICD-10-CM | POA: Diagnosis not present

## 2016-03-10 DIAGNOSIS — Z8249 Family history of ischemic heart disease and other diseases of the circulatory system: Secondary | ICD-10-CM | POA: Diagnosis not present

## 2016-03-10 DIAGNOSIS — R0602 Shortness of breath: Secondary | ICD-10-CM | POA: Diagnosis not present

## 2016-03-10 DIAGNOSIS — J441 Chronic obstructive pulmonary disease with (acute) exacerbation: Secondary | ICD-10-CM | POA: Diagnosis present

## 2016-03-10 DIAGNOSIS — R1312 Dysphagia, oropharyngeal phase: Secondary | ICD-10-CM | POA: Diagnosis not present

## 2016-03-10 DIAGNOSIS — H409 Unspecified glaucoma: Secondary | ICD-10-CM | POA: Diagnosis present

## 2016-03-10 DIAGNOSIS — R0600 Dyspnea, unspecified: Secondary | ICD-10-CM | POA: Diagnosis not present

## 2016-03-10 DIAGNOSIS — I110 Hypertensive heart disease with heart failure: Secondary | ICD-10-CM | POA: Diagnosis present

## 2016-03-10 DIAGNOSIS — E876 Hypokalemia: Secondary | ICD-10-CM | POA: Diagnosis not present

## 2016-03-10 DIAGNOSIS — R296 Repeated falls: Secondary | ICD-10-CM | POA: Diagnosis not present

## 2016-03-10 DIAGNOSIS — M6289 Other specified disorders of muscle: Secondary | ICD-10-CM | POA: Diagnosis not present

## 2016-03-10 DIAGNOSIS — Z79899 Other long term (current) drug therapy: Secondary | ICD-10-CM | POA: Diagnosis not present

## 2016-03-10 DIAGNOSIS — E785 Hyperlipidemia, unspecified: Secondary | ICD-10-CM | POA: Diagnosis present

## 2016-03-10 DIAGNOSIS — Z6820 Body mass index (BMI) 20.0-20.9, adult: Secondary | ICD-10-CM | POA: Diagnosis not present

## 2016-03-10 DIAGNOSIS — Z87891 Personal history of nicotine dependence: Secondary | ICD-10-CM | POA: Diagnosis not present

## 2016-03-10 DIAGNOSIS — F329 Major depressive disorder, single episode, unspecified: Secondary | ICD-10-CM | POA: Diagnosis present

## 2016-03-10 DIAGNOSIS — I10 Essential (primary) hypertension: Secondary | ICD-10-CM | POA: Diagnosis not present

## 2016-03-10 DIAGNOSIS — S93402A Sprain of unspecified ligament of left ankle, initial encounter: Secondary | ICD-10-CM | POA: Diagnosis not present

## 2016-03-10 DIAGNOSIS — R2689 Other abnormalities of gait and mobility: Secondary | ICD-10-CM | POA: Diagnosis not present

## 2016-03-10 DIAGNOSIS — Z881 Allergy status to other antibiotic agents status: Secondary | ICD-10-CM | POA: Diagnosis not present

## 2016-03-10 DIAGNOSIS — Z823 Family history of stroke: Secondary | ICD-10-CM | POA: Diagnosis not present

## 2016-03-10 DIAGNOSIS — Z66 Do not resuscitate: Secondary | ICD-10-CM | POA: Diagnosis present

## 2016-03-10 DIAGNOSIS — Z9049 Acquired absence of other specified parts of digestive tract: Secondary | ICD-10-CM | POA: Diagnosis not present

## 2016-03-10 LAB — ECHOCARDIOGRAM COMPLETE
CHL CUP DOP CALC LVOT VTI: 15.6 cm
CHL CUP MV DEC (S): 130
CHL CUP RV SYS PRESS: 67 mmHg
EERAT: 20.05
EWDT: 130 ms
FS: 31 % (ref 28–44)
HEIGHTINCHES: 61 in
IV/PV OW: 1.2
LA ID, A-P, ES: 30 mm
LA diam index: 2.06 cm/m2
LA vol A4C: 33.1 ml
LA vol index: 23.3 mL/m2
LA vol: 33.8 mL
LDCA: 2.27 cm2
LEFT ATRIUM END SYS DIAM: 30 mm
LV E/e' medial: 20.05
LV E/e'average: 20.05
LV PW d: 10 mm — AB (ref 0.6–1.1)
LV TDI E'LATERAL: 3.7
LV TDI E'MEDIAL: 4.9
LVELAT: 3.7 cm/s
LVOT SV: 35 mL
LVOT peak grad rest: 4 mmHg
LVOTD: 17 mm
LVOTPV: 101 cm/s
Lateral S' vel: 9.14 cm/s
MV Peak grad: 2 mmHg
MV pk E vel: 74.2 m/s
P 1/2 time: 295 ms
Reg peak vel: 361 cm/s
TAPSE: 15.7 mm
TR max vel: 361 cm/s
Weight: 1730 oz

## 2016-03-10 LAB — BRAIN NATRIURETIC PEPTIDE: B Natriuretic Peptide: 280.8 pg/mL — ABNORMAL HIGH (ref 0.0–100.0)

## 2016-03-10 MED ORDER — SODIUM CHLORIDE 0.9% FLUSH
3.0000 mL | Freq: Two times a day (BID) | INTRAVENOUS | Status: DC
  Administered 2016-03-10 – 2016-03-13 (×7): 3 mL via INTRAVENOUS

## 2016-03-10 MED ORDER — ENOXAPARIN SODIUM 40 MG/0.4ML ~~LOC~~ SOLN
40.0000 mg | Freq: Every day | SUBCUTANEOUS | Status: DC
  Administered 2016-03-10 – 2016-03-13 (×4): 40 mg via SUBCUTANEOUS
  Filled 2016-03-10 (×4): qty 0.4

## 2016-03-10 MED ORDER — FUROSEMIDE 10 MG/ML IJ SOLN: 20.0000 mg | Freq: Once | INTRAMUSCULAR | Status: DC

## 2016-03-10 MED ORDER — FUROSEMIDE 10 MG/ML IJ SOLN
20.0000 mg | Freq: Every day | INTRAMUSCULAR | Status: DC
  Administered 2016-03-10 – 2016-03-12 (×3): 20 mg via INTRAVENOUS
  Filled 2016-03-10 (×3): qty 2

## 2016-03-10 MED ORDER — ADULT MULTIVITAMIN W/MINERALS CH
1.0000 | ORAL_TABLET | Freq: Every day | ORAL | Status: DC
  Administered 2016-03-10 – 2016-03-12 (×3): 1 via ORAL
  Filled 2016-03-10 (×4): qty 1

## 2016-03-10 MED ORDER — DILTIAZEM HCL ER COATED BEADS 240 MG PO CP24
240.0000 mg | ORAL_CAPSULE | Freq: Every day | ORAL | Status: DC
  Administered 2016-03-10 – 2016-03-13 (×4): 240 mg via ORAL
  Filled 2016-03-10 (×4): qty 1

## 2016-03-10 MED ORDER — SODIUM CHLORIDE 0.9 % IV SOLN: 250.0000 mL | INTRAVENOUS | Status: DC | PRN

## 2016-03-10 MED ORDER — ASPIRIN 81 MG PO CHEW: 81.0000 mg | CHEWABLE_TABLET | Freq: Every day | ORAL | Status: DC | PRN

## 2016-03-10 MED ORDER — ONDANSETRON HCL 4 MG/2ML IJ SOLN
4.0000 mg | Freq: Four times a day (QID) | INTRAMUSCULAR | Status: DC | PRN
Start: 2016-03-10 — End: 2016-03-13

## 2016-03-10 MED ORDER — METOPROLOL SUCCINATE ER 25 MG PO TB24
25.0000 mg | ORAL_TABLET | Freq: Every day | ORAL | Status: DC
  Administered 2016-03-10 – 2016-03-13 (×4): 25 mg via ORAL
  Filled 2016-03-10 (×4): qty 1

## 2016-03-10 MED ORDER — ALBUTEROL SULFATE (2.5 MG/3ML) 0.083% IN NEBU
2.5000 mg | INHALATION_SOLUTION | Freq: Once | RESPIRATORY_TRACT | Status: AC
  Administered 2016-03-10: 2.5 mg via RESPIRATORY_TRACT
  Filled 2016-03-10: qty 3

## 2016-03-10 MED ORDER — FUROSEMIDE 10 MG/ML IJ SOLN: 40.0000 mg | Freq: Every day | INTRAMUSCULAR | Status: DC

## 2016-03-10 MED ORDER — SODIUM CHLORIDE 0.9% FLUSH
3.0000 mL | INTRAVENOUS | Status: DC | PRN
  Administered 2016-03-11: 3 mL via INTRAVENOUS
  Filled 2016-03-10: qty 3

## 2016-03-10 MED ORDER — LORATADINE 10 MG PO TABS
10.0000 mg | ORAL_TABLET | Freq: Every day | ORAL | Status: DC
Start: 2016-03-10 — End: 2016-03-13
  Administered 2016-03-10 – 2016-03-13 (×4): 10 mg via ORAL
  Filled 2016-03-10 (×4): qty 1

## 2016-03-10 MED ORDER — ACETAMINOPHEN 325 MG PO TABS: 650.0000 mg | ORAL_TABLET | ORAL | Status: DC | PRN

## 2016-03-10 MED ORDER — FLECAINIDE ACETATE 50 MG PO TABS
50.0000 mg | ORAL_TABLET | Freq: Two times a day (BID) | ORAL | Status: DC
  Administered 2016-03-10 – 2016-03-13 (×8): 50 mg via ORAL
  Filled 2016-03-10 (×8): qty 1

## 2016-03-10 NOTE — H&P (Signed)
History and Physical    Beth [name] [id number] DOB: 1924-03-14 DOA: 03/09/2016   PCP: Penni Homans, MD Chief Complaint:  Chief Complaint  Patient presents with  . Shortness of Breath    HPI: Beth [name] is a 82 y.o. female with medical history significant of A.Fib, CHF with EF 40-45%, pacer, COPD, Lung nodules that are being just followed (and appear not to be doing very much despite being around for at least a year, maybe even 10 years from the records).  Patient presents to the ED with c/o SOB.  Patients symptoms are worse with exertion or laying down flat.  Symptoms have been ongoing for the past 2-3 days.  Last took diuretic that she takes PRN at home some 6 days ago.  Currently 2 lbs above baseline.  She also has had a fall yesterday, and L ankle pain since that time, worse with weight bearing.  Minimally ambulatory since fall.  ED Course: CT chest shows trace R pleural effusion, lung nodules not doing a whole lot since October of last year.  X ray of ankle is negative.  Review of Systems: As per HPI otherwise 10 point review of systems negative.    Past Medical History:  Diagnosis Date  . Abdominal pain, unspecified site 08/17/2012  . Allergic state 06/22/2013  . Allergy   . Arthritis   . Atrial fibrillation with rapid ventricular response ([place])   . COPD (chronic obstructive pulmonary disease) ([place])   . Glaucoma 07/06/2014  . Hyperglycemia 02/01/2016  . Hypertension   . Increased urinary frequency 01/02/2014  . Loss of weight 01/19/2013  . Lung nodule    CT of chest 2007-left loewr lobar mass- evaluated by pulmonary- patient refused further work up  . Osteoporosis   . Other and unspecified hyperlipidemia 08/17/2012  . Syncope    fall  . Systolic CHF ([place])    EF [id number] 09/2014  . Tobacco abuse   . Vertigo     Past Surgical History:  Procedure Laterality Date  . ABDOMINAL HYSTERECTOMY    . APPENDECTOMY    . CESAREAN SECTION    . CHOLECYSTECTOMY    .  EP IMPLANTABLE DEVICE N/A 09/26/2014   Procedure: Pacemaker Implant;  Surgeon: Evans Lance, MD;  Location: [facility];  Service: Cardiovascular;  Laterality: N/A;  . HEMORRHOID SURGERY    . HERNIA REPAIR    . THORACENTESIS Right 01/07/15   Exudative Effusion Lymphocyte predominant     reports that she has quit smoking. Her smoking use included Cigarettes. She has a 6.00 pack-year smoking history. She has never used smokeless tobacco. She reports that she does not drink alcohol or use drugs.  Allergies  Allergen Reactions  . Penicillins Shortness Of Breath, Diarrhea and Other (See Comments)    Stomach cramping  . Ciprofloxacin Other (See Comments)    myalgias  . Fexofenadine Other (See Comments)    unknown    Family History  Problem Relation Age of Onset  . Heart disease Mother   . Hypertension Mother   . Diabetes Mother   . Diabetes    . Cancer      lung- in distant releatives  . Cancer Brother   . Lupus Brother   . Stroke Mother   . Cholelithiasis Father   . Nephrolithiasis Father       Prior to Admission medications   Medication Sig Start Date End Date Taking? Authorizing Provider  aspirin 81 MG chewable tablet Chew 81  mg by mouth daily as needed (TAKES OCCASIONALLY).    Yes Historical Provider, MD  cetirizine (ZYRTEC) 10 MG tablet Take 10 mg by mouth every morning.   Yes Historical Provider, MD  diltiazem (TIAZAC) 240 MG 24 hr capsule TAKE 1 CAPSULE (240 MG TOTAL) BY MOUTH DAILY. 02/01/16  Yes Mosie Lukes, MD  fish oil-omega-3 fatty acids 1000 MG capsule Take 1 g by mouth daily at 12 noon.    Yes Historical Provider, MD  flecainide (TAMBOCOR) 50 MG tablet TAKE 1 TABLET BY MOUTH EVERY 12 HOURS 12/31/15  Yes Mosie Lukes, MD  furosemide (LASIX) 20 MG tablet TAKE 1 TABLET (20 MG TOTAL) BY MOUTH DAILY AS NEEDED FOR EDEMA. 01/19/16  Yes Mosie Lukes, MD  metoprolol succinate (TOPROL-XL) 25 MG 24 hr tablet TAKE 1 TABLET (25 MG TOTAL) BY MOUTH DAILY. 02/01/16  Yes  Mosie Lukes, MD  Multiple Vitamin (MULTIVITAMIN) tablet Take 1 tablet by mouth daily at 12 noon.    Yes Historical Provider, MD  potassium chloride (K-DUR,KLOR-CON) 10 MEQ tablet Take 1 tablet (10 mEq total) by mouth daily as needed (for when you take Lasix). 02/01/16  Yes Mosie Lukes, MD  Probiotic Product (PROBIOTIC ADVANCED) CAPS Take 2 capsules by mouth daily.    Yes Historical Provider, MD    Physical Exam: Vitals:   03/09/16 2003 03/09/16 2030 03/09/16 2115 03/10/16 0100  BP: 167/70 172/76 171/57 155/67  Pulse: 75 63 65 71  Resp: 22 25 22 19   Temp: 97.9 F (36.6 C)     TempSrc: Oral     SpO2: (S) 91% 99% 98% 96%  Weight: 49 kg (108 lb 2 oz)     Height: 5\' 1"  (1.549 m)         Constitutional: NAD, calm, comfortable Eyes: PERRL, lids and conjunctivae normal ENMT: Mucous membranes are moist. Posterior pharynx clear of any exudate or lesions.Normal dentition.  Neck: normal, supple, no masses, no thyromegaly Respiratory: clear to auscultation bilaterally, no wheezing, no crackles. Normal respiratory effort. No accessory muscle use.  Cardiovascular: Regular rate and rhythm, no murmurs / rubs / gallops. No extremity edema. 2+ pedal pulses. No carotid bruits.  Abdomen: no tenderness, no masses palpated. No hepatosplenomegaly. Bowel sounds positive.  Musculoskeletal: L ankle edema, tenderness just inferior to lateral malleolus, but no bony tenderness. Skin: no rashes, lesions, ulcers. No induration Neurologic: CN 2-12 grossly intact. Sensation intact, DTR normal. Strength 5/5 in all 4.  Psychiatric: Normal judgment and insight. Alert and oriented x 3. Normal mood.    Labs on Admission: I have personally reviewed following labs and imaging studies  CBC:  Recent Labs Lab 03/09/16 2043  WBC 9.2  HGB 11.9*  HCT 34.9*  MCV 88.6  PLT 123456   Basic Metabolic Panel:  Recent Labs Lab 03/09/16 2043  NA 136  K 4.1  CL 102  CO2 24  GLUCOSE 126*  BUN 15  CREATININE 0.74    CALCIUM 9.4   GFR: Estimated Creatinine Clearance: 35.3 mL/min (by C-G formula based on SCr of 0.74 mg/dL). Liver Function Tests: No results for input(s): AST, ALT, ALKPHOS, BILITOT, PROT, ALBUMIN in the last 168 hours. No results for input(s): LIPASE, AMYLASE in the last 168 hours. No results for input(s): AMMONIA in the last 168 hours. Coagulation Profile: No results for input(s): INR, PROTIME in the last 168 hours. Cardiac Enzymes: No results for input(s): CKTOTAL, CKMB, CKMBINDEX, TROPONINI in the last 168 hours. BNP (last 3 results) No  results for input(s): PROBNP in the last 8760 hours. HbA1C: No results for input(s): HGBA1C in the last 72 hours. CBG: No results for input(s): GLUCAP in the last 168 hours. Lipid Profile: No results for input(s): CHOL, HDL, LDLCALC, TRIG, CHOLHDL, LDLDIRECT in the last 72 hours. Thyroid Function Tests: No results for input(s): TSH, T4TOTAL, FREET4, T3FREE, THYROIDAB in the last 72 hours. Anemia Panel: No results for input(s): VITAMINB12, FOLATE, FERRITIN, TIBC, IRON, RETICCTPCT in the last 72 hours. Urine analysis:    Component Value Date/Time   COLORURINE YELLOW 03/05/2015 1549   APPEARANCEUR CLEAR 03/05/2015 1549   LABSPEC 1.010 03/05/2015 1549   PHURINE 6.5 03/05/2015 1549   GLUCOSEU NEGATIVE 03/05/2015 1549   HGBUR TRACE-INTACT (A) 03/05/2015 1549   HGBUR moderate 12/22/2009 1451   BILIRUBINUR Negative 10/14/2015 1535   KETONESUR NEGATIVE 03/05/2015 1549   PROTEINUR Negative 10/14/2015 1535   PROTEINUR NEGATIVE 01/05/2015 1815   UROBILINOGEN negative 10/14/2015 1535   UROBILINOGEN 0.2 03/05/2015 1549   NITRITE negative 10/14/2015 1535   NITRITE NEGATIVE 03/05/2015 1549   LEUKOCYTESUR Negative 10/14/2015 1535   Sepsis Labs: @LABRCNTIP (procalcitonin:4,lacticidven:4) )No results found for this or any previous visit (from the past 240 hour(s)).   Radiological Exams on Admission: Dg Chest 2 View  Result Date:  03/09/2016 CLINICAL DATA:  Shortness of breath EXAM: CHEST  2 VIEW COMPARISON:  Chest radiograph 07/29/2015 FINDINGS: Multiple pulmonary nodules are again seen, primarily within the left mid lung. The lungs are hyperinflated with diffusely increased pulmonary markings suggestive of COPD. Left chest wall pacemaker leads are in radiographically appropriate position. No pleural effusion or pneumothorax. No focal airspace consolidation or pulmonary edema. IMPRESSION: 1. COPD and multiple pulmonary nodules, unchanged compared to 07/29/2015. The patient's most recent CT was 01/06/2015. A follow-up CT examination should be considered, as the appearance is concerning for metastatic disease. 2. No acute cardiopulmonary disease. Electronically Signed   By: Ulyses Jarred M.D.   On: 03/09/2016 22:02   Ct Chest W Contrast  Result Date: 03/10/2016 CLINICAL DATA:  82 y/o  F; multiple pulmonary nodules. EXAM: CT CHEST WITH CONTRAST TECHNIQUE: Multidetector CT imaging of the chest was performed during intravenous contrast administration. CONTRAST:  75 cc ISOVUE-300 IOPAMIDOL (ISOVUE-300) INJECTION 61% COMPARISON:  01/06/2015 CT chest.  03/09/2016 chest radiograph. FINDINGS: Cardiovascular: Normal caliber thoracic aorta. Extensive aortic atherosclerosis with both calcific and fibrofatty atherosclerosis. Normal caliber main pulmonary artery. Normal heart size. No pericardial effusion. Two lead pacemaker. Moderate coronary artery calcification. Mediastinum/Nodes: No enlarged mediastinal, hilar, or axillary lymph nodes. Thyroid gland, trachea, and esophagus demonstrate no significant findings. Lungs/Pleura: Trace right pleural effusion decreased from prior CT. There are numerous pulmonary nodules throughout the lungs (> than 20) which demonstrate course calcification throughout all lobes although more pronounced in the upper lung zones. In comparison with the prior CT of the chest some may be slightly enlarged for example in the  superior segment of the left lower lobe (series 7, image 34) where the nodule measures 18 x 15 mm, previously 14 x 15 mm, however, this may also be due to differences in slice selection. Improved aeration within the long along the right lower mediastinum. Improved aeration of the right posterior lower lobe due to near complete resolution of pleural effusion. No definite new pulmonary nodule is identified. There is a background of centrilobular emphysema and septal thickening in the lung apices. Upper Abdomen: Liver hypoattenuation with geographic areas of increased density at the peripheries probably representing steatosis and areas of focal sparing.  Fluid attenuating partially visualized lesion within the gallbladder fossa of uncertain significance. Musculoskeletal: Stable T11 compression deformity. Vertebral body heights are otherwise preserved. No acute osseous abnormality is identified. IMPRESSION: 1. Numerous calcified pulmonary nodules are stable to minimally increased in size in comparison with the prior CT of the chest. Small differences in measurements may be due to differences in slice selection. No definite new pulmonary nodule. No mediastinal adenopathy. Findings may represent sequelae of granulomatous disease such as tuberculosis, histoplasmosis, or sarcoidosis. There is a upper lung predominance which may also be seen with occupational exposure. Metastatic disease is less likely given relative stability. 2. Diminished right pleural effusion with trace residual and improved aeration of the right lung. 3. Stable chronic T11 compression deformity. 4. Aortic and coronary artery atherosclerosis. 5. Partially visualized fluid attenuating structure within the gallbladder fossa. Given history of cholecystectomy this is of uncertain significance. Consider abdominal ultrasound for further characterization as clinically indicated. Electronically Signed   By: Kristine Garbe M.D.   On: 03/10/2016 00:03    Dg Foot Complete Left  Result Date: 03/09/2016 CLINICAL DATA:  Initial evaluation for acute trauma, fall. Edema about left foot EXAM: LEFT FOOT - COMPLETE 3+ VIEW COMPARISON:  None. FINDINGS: Severe osteopenia, somewhat limiting evaluation for possible subtle acute nondisplaced fractures. No acute fracture or dislocation. Joint spaces maintained. No appreciable soft tissue swelling identified. Mild vascular occasions noted posterior to the ankle. IMPRESSION: 1. No acute osseous abnormality about the left foot. 2. Severe osteopenia. Electronically Signed   By: Jeannine Boga M.D.   On: 03/09/2016 22:00    EKG: Independently reviewed.  Assessment/Plan Principal Problem:   Acute on chronic combined systolic and diastolic CHF (congestive heart failure) (HCC) Active Problems:   Essential hypertension   Pulmonary HTN   Multiple pulmonary nodules   Left ankle sprain    1. Acute on chronic combined CHF - 1. CHF pathway 2. Got 20 of lasix in ED 3. Was going to give an extra 20 and go to 40 daily, but patient with already >1L output in ED less than an hour in 4. So will just do 20 daily with next dose later this morning 5. 2d echo ordered 2. Pulmonary HTN - re-evaluate on 2d echo later this morning, pulm art pressure 47 peak in Oct 2016 3. HTN - continue home meds 4. Left ankle sprain - 1. PT/OT to eval and see if she is able to go home with this (currently lives alone). 5. Multiple pulm nodules - Not really changed a whole lot in the past 14 months (therefore presumably not cancer), followed by Dr. Vaughan Browner who also dosent seem too impressed.  Actually I see pulm nodules in the records as far back as 2007.   DVT prophylaxis: Lovenox Code Status: DNR - confirmed with daughter, and patient emphatically says "NO!" When asked if she would want resuscitation. Family Communication: Daughter at bedside Consults called: None Admission status: Admit to obs   Etta Quill DO [facility] Pager [phone] from 7PM-7AM  If 7AM-7PM, please contact the day physician for the patient [email or web address] Password [email or web address]  03/10/2016, 1:16 AM

## 2016-03-10 NOTE — Evaluation (Signed)
Occupational Therapy Evaluation Patient Details Name: Beth [name] MRN: [id number] DOB: 1923/08/31 Today's Date: 03/10/2016    History of Present Illness  82 y.o. female with medical history significant of A.Fib, CHF with EF 40-45%, pacer, COPD, Lung nodules that are being just followed (and appear not to be doing very much despite being around for at least a year, maybe even 10 years from the records).  Patient presents to the ED with c/o SOB. Dx of acute on chronic CHF, fall, L ankle sprain.    Clinical Impression   Pt reports she was independent with ADL PTA. Currently pt min assist for functional mobility, supervision for ADL in sitting. Pt c/o SOB following activity, SpO2=86% on RA, applied 2L O2/[place] with rise in SpO2 to 95% (RN aware). Pt noted to have increased edema in L toes and calf area; removed ace bandage and RN notified. Recommending SNF for follow up to maximize independence and safety with ADL and functional mobility prior to return home alone. Pt would benefit from continued skilled OT to address established goals.    Follow Up Recommendations  SNF;Supervision/Assistance - 24 hour    Equipment Recommendations  Other (comment) (TBD at next venue)    Recommendations for Other Services       Precautions / Restrictions Precautions Precautions: Fall Restrictions Weight Bearing Restrictions: No      Mobility Bed Mobility Overal bed mobility: Modified Independent             General bed mobility comments: HOB elevated, increased time.  Transfers Overall transfer level: Needs assistance Equipment used: Rolling walker (2 wheeled) Transfers: Sit to/from Stand Sit to Stand: Min assist         General transfer comment: Steadying assist. Cues for hand placement    Balance Overall balance assessment: Needs assistance;History of Falls Sitting-balance support: Feet supported;No upper extremity supported Sitting balance-Leahy Scale: Good     Standing  balance support: Bilateral upper extremity supported Standing balance-Leahy Scale: Poor Standing balance comment: RW for support                            ADL Overall ADL's : Needs assistance/impaired Eating/Feeding: Set up;Sitting   Grooming: Set up;Supervision/safety;Sitting   Upper Body Bathing: Set up;Supervision/ safety;Sitting   Lower Body Bathing: Minimal assistance;Sit to/from stand Lower Body Bathing Details (indicate cue type and reason): assist for standing balance Upper Body Dressing : Set up;Supervision/safety;Sitting   Lower Body Dressing: Supervision/safety;Bed level Lower Body Dressing Details (indicate cue type and reason): to don L sock Toilet Transfer: Minimal assistance;Ambulation;RW Toilet Transfer Details (indicate cue type and reason): Simulated by sit to stand from EOB with functional mobility.         Functional mobility during ADLs: Minimal assistance;Rolling walker General ADL Comments: Pt reports she feels like she "cant breath" after functional mobility; SpO2=86% on RA; applied 2L O2/[place]; SpO2 up to 95%. Pt noted to have increased edema in L foot and calf; removed ACE bandage (RN aware).     Vision     Perception     Praxis      Pertinent Vitals/Pain Pain Assessment: Faces Faces Pain Scale: Hurts little more Pain Location: L ankle Pain Descriptors / Indicators: Sore;Grimacing;Discomfort Pain Intervention(s): Monitored during session     Hand Dominance     Extremity/Trunk Assessment Upper Extremity Assessment Upper Extremity Assessment: Overall WFL for tasks assessed   Lower Extremity Assessment Lower Extremity Assessment: Defer to PT  evaluation   Cervical / Trunk Assessment Cervical / Trunk Assessment: Normal   Communication Communication Communication: HOH   Cognition Arousal/Alertness: Awake/alert Behavior During Therapy: WFL for tasks assessed/performed Overall Cognitive Status: No family/caregiver present to  determine baseline cognitive functioning                 General Comments: Pt frequently repeating herself.   General Comments       Exercises       Shoulder Instructions      Home Living Family/patient expects to be discharged to:: Private residence Living Arrangements: Alone Available Help at Discharge: Family;Available PRN/intermittently Type of Home: Apartment Home Access: Stairs to enter Entrance Stairs-Number of Steps: 13 Entrance Stairs-Rails: Right;Left;Can reach both Home Layout: One level     Bathroom Shower/Tub: Tub/shower unit Shower/tub characteristics: Curtain Biochemist, clinical: Standard     Home Equipment: Environmental consultant - 2 wheels;Walker - 4 wheels;Cane - single point;Grab bars - tub/shower          Prior Functioning/Environment Level of Independence: Independent with assistive device(s)        Comments: daughter drives her to appointments, uses RW for going out, no AD in apartment        OT Problem List: Decreased activity tolerance;Impaired balance (sitting and/or standing);Decreased cognition;Decreased safety awareness;Decreased knowledge of use of DME or AE;Decreased knowledge of precautions;Pain;Increased edema   OT Treatment/Interventions: Self-care/ADL training;DME and/or AE instruction;Energy conservation;Therapeutic activities;Patient/family education;Balance training;Cognitive remediation/compensation    OT Goals(Current goals can be found in the care plan section) Acute Rehab OT Goals Patient Stated Goal: return home OT Goal Formulation: With patient Time For Goal Achievement: 03/24/16 Potential to Achieve Goals: Good ADL Goals Pt Will Perform Grooming: with supervision;standing Pt Will Perform Upper Body Bathing: with supervision;standing Pt Will Perform Lower Body Bathing: with supervision Pt Will Transfer to Toilet: with supervision;ambulating;regular height toilet Pt Will Perform Toileting - Clothing Manipulation and hygiene: with  supervision;sit to/from stand Pt Will Perform Tub/Shower Transfer: Tub transfer;with supervision;ambulating;rolling walker  OT Frequency: Min 2X/week   Barriers to D/C: Decreased caregiver support  pt lives alone       Co-evaluation              End of Session Equipment Utilized During Treatment: Gait belt;Rolling walker;Oxygen Nurse Communication: Mobility status;Other (comment) (applied supplemental O2, removed L ankle ace wrap)  Activity Tolerance: Patient tolerated treatment well Patient left: in bed;with call bell/phone within reach;with bed alarm set   Time: [id number] OT Time Calculation (min): 22 min Charges:  OT General Charges $OT Visit: 1 Procedure OT Evaluation $OT Eval Moderate Complexity: 1 Procedure G-Codes: OT G-codes **NOT FOR INPATIENT CLASS** Functional Assessment Tool Used: Clinical judgement Functional Limitation: Self care Self Care Current Status [id number]): At least 1 percent but less than 20 percent impaired, limited or restricted Self Care Goal Status [id number]): At least 1 percent but less than 20 percent impaired, limited or restricted   Binnie Kand M.S., OTR/L Pager: [phone]  03/10/2016, 4:38 PM

## 2016-03-10 NOTE — Care Management Note (Addendum)
Case Management Note  Patient Details  Name: Beth [name] MRN: [id number] Date of Birth: October 03, 1923  Subjective/Objective:   CHF, recent falls, Pulmonary HTN                 Action/Plan: Discharge Planning: NCM spoke to pt and offered choice for [facility]. States she lives at home alone, gave permission to speak to Tomasita Crumble # [phone]. Pt has RW, cane, beside commode, and Rollator. Contacted pt's dtr and she prefers SNF -rehab. Made her aware that PT recommended HH PT. Explained pt was observation and would need 3 night qualifying stay. Dtr states she understand, but she fears pt will fall again at home. Will continue to follow for dc needs.   PCP Mosie Lukes MD  Expected Discharge Date:               Expected Discharge Plan:  [place]  In-House Referral:  Clinical Social Work  Discharge planning Services  CM Consult  Post Acute Care Choice:  Home Health Choice offered to:  Patient  DME Arranged:  N/A DME Agency:  NA  HH Arranged:  PT [place] Agency:  [place]  Status of Service:  In process, will continue to follow  If discussed at Long Length of Stay Meetings, dates discussed:    Additional Comments:  Erenest Rasher, RN 03/10/2016, 2:07 PM

## 2016-03-10 NOTE — Progress Notes (Signed)
New Admission Note:   Arrival Method: From ED via stretcher Mental Orientation: AOX4 Telemetry: Box 2w34 Assessment: Completed Skin: Intact IV: L AC(Saline Locked) Pain: denies pain Tubes: None Safety Measures: Safety Fall Prevention Plan has been discussed  Admission:  2 [place] Orientation: Patient has been orientated to the room, unit and staff.  Family: Daughter at bedside  Orders to be reviewed and implemented. Will continue to monitor the patient. Call light has been placed within reach and bed alarm has been activated. Admitting MD notified of pt arrival to floor.    Isac Caddy, RN

## 2016-03-10 NOTE — Evaluation (Addendum)
Physical Therapy Evaluation Patient Details Name: Beth [name] MRN: [id number] DOB: [birth year] Today's Date: 03/10/2016   History of Present Illness   82 y.o. female with medical history significant of A.Fib, CHF with EF 40-45%, pacer, COPD, Lung nodules that are being just followed (and appear not to be doing very much despite being around for at least a year, maybe even 10 years from the records).  Patient presents to the ED with c/o SOB. Dx of acute on chronic CHF, fall, L ankle sprain.   Clinical Impression  Pt admitted with above diagnosis. Pt currently with functional limitations due to the deficits listed below (see PT Problem List). Pt ambulated 25' with RW, distance limited by L ankle pain. She lives alone, declines SNF, stated her daughter can check in on her. She feels she can manage at home. PT recommending SNF as pt lives alone and had a fall. Pt will benefit from skilled PT to increase their independence and safety with mobility to allow discharge to the venue listed below.       Follow Up Recommendations SNF (HHPT if pt declines SNF)    Equipment Recommendations  None recommended by PT    Recommendations for Other Services       Precautions / Restrictions Precautions Precautions: Fall Precaution Comments: pt denies other falls in this past 1 year Restrictions Weight Bearing Restrictions: No      Mobility  Bed Mobility Overal bed mobility: Independent                Transfers Overall transfer level: Needs assistance Equipment used: Rolling walker (2 wheeled) Transfers: Sit to/from Stand Sit to Stand: Min assist         General transfer comment: min A to rise  Ambulation/Gait Ambulation/Gait assistance: Supervision Ambulation Distance (Feet): 25 Feet Assistive device: Rolling walker (2 wheeled) Gait Pattern/deviations: WFL(Within Functional Limits)   Gait velocity interpretation: at or above normal speed for age/gender General Gait Details:  5/10 L ankle pain with weight bearing, no LOB  Stairs            Wheelchair Mobility    Modified Rankin (Stroke Patients Only)       Balance Overall balance assessment: History of Falls                                           Pertinent Vitals/Pain Pain Assessment: 0-10 Pain Score: 5  Pain Location: L ankle with walking, 0/10 at rest Pain Descriptors / Indicators: Tender Pain Intervention(s): Limited activity within patient's tolerance;Monitored during session    Home Living Family/patient expects to be discharged to:: Private residence Living Arrangements: Alone Available Help at Discharge: Family;Available PRN/intermittently Type of Home: Apartment Home Access: Stairs to enter Entrance Stairs-Rails: Right;Left;Can reach both Entrance Stairs-Number of Steps: 13 Home Layout: One level Home Equipment: Walker - 2 wheels;Walker - 4 wheels;Cane - single point Additional Comments: daughter can check in on pt    Prior Function Level of Independence: Independent with assistive device(s)         Comments: daughter drives her to appointments, uses RW for going out, no AD in apartment     Hand Dominance        Extremity/Trunk Assessment   Upper Extremity Assessment Upper Extremity Assessment: Overall WFL for tasks assessed    Lower Extremity Assessment Lower Extremity Assessment: LLE deficits/detail LLE Deficits / Details:  ankle strength NT 2* pain,  PF/DF AROM WFL    Cervical / Trunk Assessment Cervical / Trunk Assessment: Normal  Communication   Communication: HOH  Cognition Arousal/Alertness: Awake/alert Behavior During Therapy: WFL for tasks assessed/performed Overall Cognitive Status: Within Functional Limits for tasks assessed                      General Comments      Exercises     Assessment/Plan    PT Assessment Patient needs continued PT services  PT Problem List Decreased activity tolerance;Decreased  mobility;Pain          PT Treatment Interventions Gait training;Stair training;Functional mobility training;Therapeutic exercise;Therapeutic activities;Patient/family education    PT Goals (Current goals can be found in the Care Plan section)  Acute Rehab PT Goals Patient Stated Goal: to return home PT Goal Formulation: With patient Time For Goal Achievement: 03/24/16 Potential to Achieve Goals: Good    Frequency Min 3X/week   Barriers to discharge        Co-evaluation               End of Session Equipment Utilized During Treatment: Gait belt Activity Tolerance: Patient tolerated treatment well Patient left: in chair;with call bell/phone within reach;with chair alarm set Nurse Communication: Mobility status    Functional Assessment Tool Used: clinical judgement Functional Limitation: Mobility: Walking and moving around Mobility: Walking and Moving Around Current Status [phone]): At least 20 percent but less than 40 percent impaired, limited or restricted Mobility: Walking and Moving Around Goal Status [phone]): At least 1 percent but less than 20 percent impaired, limited or restricted    Time: [id number] PT Time Calculation (min) (ACUTE ONLY): 28 min   Charges:   PT Evaluation $PT Eval Low Complexity: 1 Procedure PT Treatments $Gait Training: 8-22 mins   PT G Codes:   PT G-Codes **NOT FOR INPATIENT CLASS** Functional Assessment Tool Used: clinical judgement Functional Limitation: Mobility: Walking and moving around Mobility: Walking and Moving Around Current Status [id number]): At least 20 percent but less than 40 percent impaired, limited or restricted Mobility: Walking and Moving Around Goal Status [phone]): At least 1 percent but less than 20 percent impaired, limited or restricted    Philomena Doheny 03/10/2016, 12:36 PM [phone]

## 2016-03-10 NOTE — Progress Notes (Signed)
Patient seen and examined this morning, admitted overnight by Dr. Alcario Drought, H&P reviewed and agree with A/P  In brief, this is a pleasant 82 yo F with history of A.Fib, CHF with EF 40-45%, pacer, COPD, Lung nodules (stable), admitted 12/14 with dyspnea, orthopnea, with 2 lb weight gain, with acute on chronic combined CHF   Acute on chronic combined CHF - continue Lasix iv for today, she is improving   Recent falls - PT evaluation pending,  - left ankle without acute fractures  Pulmonary HTN - 2d echo pending  HTN - continue home medications  Pulmonary nodules - stable, outpatient follow up  Tobacco abuse, in remission - quit 2 years ago   Jeslynn Hollander M. Cruzita Lederer, MD [facility] [phone]

## 2016-03-10 NOTE — Progress Notes (Signed)
  Echocardiogram 2D Echocardiogram has been performed.  Beth [name] 03/10/2016, 2:54 PM

## 2016-03-10 NOTE — Care Management Obs Status (Signed)
[place] NOTIFICATION   Patient Details  Name: Beth [name] MRN: [id number] Date of Birth: 03/11/1924   Medicare Observation Status Notification Given:  Yes    Erenest Rasher, RN 03/10/2016, 1:42 PM

## 2016-03-10 NOTE — ED Notes (Signed)
Attempted report 

## 2016-03-10 NOTE — Progress Notes (Signed)
OT Cancellation Note  Patient Details Name: Beth [name] MRN: [id number] DOB: 08-17-1923   Cancelled Treatment:    Reason Eval/Treat Not Completed: Patient at procedure or test/ unavailable. Will follow up for OT eval as time allows.   Binnie Kand M.S., OTR/L Pager: [phone]  03/10/2016, 2:22 PM

## 2016-03-11 LAB — BASIC METABOLIC PANEL
ANION GAP: 6 (ref 5–15)
BUN: 9 mg/dL (ref 6–20)
CALCIUM: 8.6 mg/dL — AB (ref 8.9–10.3)
CO2: 26 mmol/L (ref 22–32)
Chloride: 102 mmol/L (ref 101–111)
Creatinine, Ser: 0.69 mg/dL (ref 0.44–1.00)
GLUCOSE: 130 mg/dL — AB (ref 65–99)
Potassium: 3.1 mmol/L — ABNORMAL LOW (ref 3.5–5.1)
Sodium: 134 mmol/L — ABNORMAL LOW (ref 135–145)

## 2016-03-11 MED ORDER — LEVALBUTEROL HCL 0.63 MG/3ML IN NEBU: 0.6300 mg | INHALATION_SOLUTION | Freq: Four times a day (QID) | RESPIRATORY_TRACT | Status: DC

## 2016-03-11 MED ORDER — LEVALBUTEROL HCL 0.63 MG/3ML IN NEBU: 0.6300 mg | INHALATION_SOLUTION | Freq: Four times a day (QID) | RESPIRATORY_TRACT | Status: DC | PRN

## 2016-03-11 MED ORDER — POTASSIUM CHLORIDE CRYS ER 20 MEQ PO TBCR
40.0000 meq | EXTENDED_RELEASE_TABLET | Freq: Once | ORAL | Status: AC
  Administered 2016-03-11: 40 meq via ORAL
  Filled 2016-03-11: qty 2

## 2016-03-11 NOTE — Clinical Social Work Placement (Signed)
   CLINICAL SOCIAL WORK PLACEMENT  NOTE  Date:  03/11/2016  Patient Details  Name: Beth [name] MRN: [id number] Date of Birth: December 22, 1923  Clinical Social Work is seeking post-discharge placement for this patient at the [place] level of care (*CSW will initial, date and re-position this form in  chart as items are completed):  Yes   Patient/family provided with [place] Work Department's list of facilities offering this level of care within the geographic area requested by the patient (or if unable, by the patient's family).  Yes   Patient/family informed of their freedom to choose among providers that offer the needed level of care, that participate in Medicare, Medicaid or managed care program needed by the patient, have an available bed and are willing to accept the patient.  Yes   Patient/family informed of [facility]'s ownership interest in [facility] and [facility], as well as of the fact that they are under no obligation to receive care at these facilities.  PASRR submitted to EDS on       PASRR number received on       Existing PASRR number confirmed on 03/11/16     FL2 transmitted to all facilities in geographic area requested by pt/family on       FL2 transmitted to all facilities within larger geographic area on       Patient informed that his/her managed care company has contracts with or will negotiate with certain facilities, including the following:            Patient/family informed of bed offers received.  Patient chooses bed at       Physician recommends and patient chooses bed at      Patient to be transferred to   on  .  Patient to be transferred to facility by       Patient family notified on   of transfer.  Name of family member notified:        PHYSICIAN Please sign FL2     Additional Comment:    _______________________________________________ Wende Neighbors, LCSW 03/11/2016, 3:00 PM

## 2016-03-11 NOTE — Clinical Social Work Note (Signed)
Clinical Social Work Assessment  Patient Details  Name: ADELISE BUSWELL MRN: [id number] Date of Birth: [birth year]  Date of referral:  03/11/16               Reason for consult:  Discharge Planning                Permission sought to share information with:  Family Supports Permission granted to share information::  Yes, Verbal Permission Granted  Name::     Irene Pap  Agency::     Relationship::  daughter  Contact Information:  [phone]  Housing/Transportation Living arrangements for the past 2 months:  [place] of Information:  Patient Patient Interpreter Needed:  None Criminal Activity/Legal Involvement Pertinent to Current Situation/Hospitalization:  No - Comment as needed Significant Relationships:  Adult Children Lives with:  Self Do you feel safe going back to the place where you live?  Yes Need for family participation in patient care:  No (Coment)  Care giving concerns:  No family/friends at bedside. patient stated that it is just her and her daughter   Facilities manager / plan:  [place] representative met patient at bedside to offer support and discuss patient needs at discharge. Patient states she is agreeable to go to SNF placement and prefer [facility] as her first choice. Patient stated  She has attended [facility] twice in the past and like the facility. CSW to complete necessary paperwork and initiate SNF placement. CSW to follow up with patient once beds are ready. CSW remains available for support and to facilitate patient discharge needs once medically stable.  Employment status:  Retired Forensic scientist:  Medicare PT Recommendations:  [place] / Referral to community resources:  [place]  Patient/Family's Response to care:  Patient verbalized appreciation and understanding for CSW role and involvement in care. Patient agreeable with current discharge plan to SNF following  discharge.  Patient/Family's Understanding of and Emotional Response to Diagnosis, Current Treatment, and Prognosis:  Patient with good understanding of current medical state and limitations around most recent hospitalization. Patient is agreeable with SNF placement in hopes of transitioning home.  Emotional Assessment Appearance:  Appears younger than stated age Attitude/Demeanor/Rapport:  Other (attitude/demeanor approprite ) Affect (typically observed):  Pleasant Orientation:  Oriented to Self, Oriented to Place, Oriented to  Time, Oriented to Situation Alcohol / Substance use:  Not Applicable Psych involvement (Current and /or in the community):  No (Comment)  Discharge Needs  Concerns to be addressed:  No discharge needs identified Readmission within the last 30 days:  No Current discharge risk:  None Barriers to Discharge:  No Barriers Identified   Rhea Pink, MSW,  [place]

## 2016-03-11 NOTE — NC FL2 (Signed)
[place] MEDICAID FL2 LEVEL OF CARE SCREENING TOOL     IDENTIFICATION  Patient Name: Beth [name] Birthdate: 11/22/1923 Sex: female Admission Date (Current Location): 03/09/2016  [facility] and [place] Number:  Herbalist and Address:  [facility] [place] [address], [place], [place] [address]      Provider Number: [id number]  Attending Physician Name and Address:  Caren Griffins, MD  Relative Name and Phone Number:  Irene Pap, [phone]    Current Level of Care: Hospital Recommended Level of Care: [place] Prior Approval Number:    Date Approved/Denied:   PASRR Number: [id number]  Discharge Plan: SNF    Current Diagnoses: Patient Active Problem List   Diagnosis Date Noted  . Left ankle sprain 03/10/2016  . Dyspnea 03/10/2016  . Hyperglycemia 02/01/2016  . Pleural effusion 06/11/2015  . Acute on chronic combined systolic and diastolic CHF (congestive heart failure) ([place]) 01/06/2015  . Atrial fibrillation ([place]) 01/06/2015  . Multiple pulmonary nodules 01/06/2015  . CHF (congestive heart failure) ([place]) 01/06/2015  . CHF exacerbation ([place]) 01/05/2015  . Pacemaker 12/23/2014  . Chest pain 11/17/2014  . Chronic systolic heart failure ([place]) 09/28/2014  . Back pain   . Sinus brady-tachy syndrome ([place]) 09/26/2014  . Solitary pulmonary nodule 09/26/2014  . Pulmonary HTN 09/26/2014  . Glaucoma 07/06/2014  . Depression 04/20/2014  . Increased urinary frequency 01/02/2014  . Atypical chest pain 09/03/2013  . Constipation 09/03/2013  . Weakness 07/13/2013  . Protein-calorie malnutrition, severe ([place]) 06/30/2013  . Syncope 06/28/2013  . Allergic state 06/22/2013  . Facial skin lesion 06/22/2013  . Loss of weight 01/19/2013  . Abdominal pain 08/17/2012  . Hyperlipidemia 08/17/2012  . Aortic insufficiency 09/22/2011  . Dystrophic nail 05/14/2011  . Neck pain 03/08/2011  . TMJ syndrome 03/08/2011  . Fatigue  02/11/2011  . Alopecia 10/16/2010  . Osteoporosis 07/08/2008  . LUNG NODULE 06/03/2008  . Vitamin D deficiency 10/30/2007  . MICROSCOPIC HEMATURIA 02/20/2007  . Essential hypertension 02/14/2007    Orientation RESPIRATION BLADDER Height & Weight     Self, Time, Situation, Place  Normal Continent Weight: 107 lb 11.2 oz (48.9 kg) Height:  5\' 1"  (154.9 cm)  BEHAVIORAL SYMPTOMS/MOOD NEUROLOGICAL BOWEL NUTRITION STATUS      Continent    AMBULATORY STATUS COMMUNICATION OF NEEDS Skin   Limited Assist Verbally Normal                       Personal Care Assistance Level of Assistance  Bathing, Feeding, Dressing Bathing Assistance: Limited assistance Feeding assistance: Limited assistance Dressing Assistance: Limited assistance     Functional Limitations Info             SPECIAL CARE FACTORS FREQUENCY  PT (By licensed PT), OT (By licensed OT)     PT Frequency: 5x week OT Frequency: 5x week            Contractures Contractures Info: Not present    Additional Factors Info  Code Status, Allergies Code Status Info: DNR Allergies Info: PENICILLINS, CIPROFLOXACIN, FEXOFENADINE           Current Medications (03/11/2016):  This is the current hospital active medication list Current Facility-Administered Medications  Medication Dose Route Frequency Provider Last Rate Last Dose  . 0.9 %  sodium chloride infusion  250 mL Intravenous PRN Etta Quill, DO      . acetaminophen (TYLENOL) tablet 650 mg  650 mg Oral  Q4H PRN Etta Quill, DO      . aspirin chewable tablet 81 mg  81 mg Oral Daily PRN Etta Quill, DO      . diltiazem (CARDIZEM CD) 24 hr capsule 240 mg  240 mg Oral Daily Etta Quill, DO   240 mg at 03/11/16 1016  . enoxaparin (LOVENOX) injection 40 mg  40 mg Subcutaneous Daily Etta Quill, DO   40 mg at 03/11/16 1017  . flecainide (TAMBOCOR) tablet 50 mg  50 mg Oral Q12H Etta Quill, DO   50 mg at 03/11/16 1016  . furosemide (LASIX)  injection 20 mg  20 mg Intravenous Daily Etta Quill, DO   20 mg at 03/11/16 1017  . levalbuterol (XOPENEX) nebulizer solution 0.63 mg  0.63 mg Nebulization Q6H PRN Caren Griffins, MD      . loratadine (CLARITIN) tablet 10 mg  10 mg Oral Daily Etta Quill, DO   10 mg at 03/11/16 1016  . metoprolol succinate (TOPROL-XL) 24 hr tablet 25 mg  25 mg Oral Daily Etta Quill, DO   25 mg at 03/11/16 1016  . multivitamin with minerals tablet 1 tablet  1 tablet Oral Q1200 Etta Quill, DO   1 tablet at 03/11/16 1300  . ondansetron (ZOFRAN) injection 4 mg  4 mg Intravenous Q6H PRN Etta Quill, DO      . sodium chloride flush (NS) 0.9 % injection 3 mL  3 mL Intravenous Q12H Etta Quill, DO   3 mL at 03/11/16 1000  . sodium chloride flush (NS) 0.9 % injection 3 mL  3 mL Intravenous PRN Etta Quill, DO   3 mL at 03/11/16 1022     Discharge Medications: Please see discharge summary for a list of discharge medications.  Relevant Imaging Results:  Relevant Lab Results:   Additional Information SS#[id number]  Wende Neighbors, LCSW

## 2016-03-11 NOTE — Progress Notes (Signed)
PROGRESS NOTE  Beth [name] [id number] DOB: [birth year] DOA: 03/09/2016 PCP: Penni Homans, MD   LOS: 1 day   Brief Narrative: 82 yo F with history of A.Fib, CHF with EF 40-45%, pacer, COPD, Lung nodules (stable), admitted 12/14 with dyspnea, orthopnea, with 2 lb weight gain, with acute on chronic combined CHF  Assessment & Plan: Principal Problem:   Acute on chronic combined systolic and diastolic CHF (congestive heart failure) (HCC) Active Problems:   Essential hypertension   Pulmonary HTN   Multiple pulmonary nodules   Left ankle sprain   Dyspnea   Acute on chronic combined CHF - continue Lasix iv for today, she is improving  Recent falls - PT evaluation recommending SNF, d/w SW - left ankle without acute fractures  Pulmonary HTN - 2d echo as below  HTN - continue home medications  Pulmonary nodules - stable, outpatient follow up  Tobacco abuse, in remission - quit 2 years ago  Hypokalemia - due to Lasix, replete. Recheck BMP in am    DVT prophylaxis: Lovenox Code Status: DNR Family Communication: no family bedside Disposition Plan: SNF 1-2 days  Consultants:   None   Procedures:   2D echo Study Conclusions - Left ventricle: LVEF is approximately 50 to 55% with apical hypokinesis. The cavity size was normal. Wall thickness was increased in a pattern of mild LVH. Doppler parameters are consistent with abnormal left ventricular relaxation (grade 1 diastolic dysfunction). - Aortic valve: There was mild regurgitation. - Mitral valve: Calcified annulus. Mildly thickened leaflets . There was mild regurgitation. - Pulmonary arteries: PA peak pressure: 67 mm Hg (S).  Antimicrobials:  None    Subjective: - no chest pain, shortness of breath, no abdominal pain, nausea or vomiting.   Objective: Vitals:   03/10/16 1243 03/10/16 2030 03/11/16 0147 03/11/16 0556  BP: (!) 136/48 (!) 169/50 (!) 141/51 (!) 139/57  Pulse: 72 77  66  Resp: 19 18   18   Temp:  98.1 F (36.7 C)  98.2 F (36.8 C)  TempSrc:  Oral  Oral  SpO2: 95% 93%  93%  Weight:  49.9 kg (109 lb 14.4 oz)  48.9 kg (107 lb 11.2 oz)  Height:        Intake/Output Summary (Last 24 hours) at 03/11/16 1251 Last data filed at 03/10/16 2211  Gross per 24 hour  Intake                0 ml  Output              300 ml  Net             -300 ml   Filed Weights   03/09/16 2003 03/10/16 2030 03/11/16 0556  Weight: 49 kg (108 lb 2 oz) 49.9 kg (109 lb 14.4 oz) 48.9 kg (107 lb 11.2 oz)    Examination: Constitutional: NAD Vitals:   03/10/16 1243 03/10/16 2030 03/11/16 0147 03/11/16 0556  BP: (!) 136/48 (!) 169/50 (!) 141/51 (!) 139/57  Pulse: 72 77  66  Resp: 19 18  18   Temp:  98.1 F (36.7 C)  98.2 F (36.8 C)  TempSrc:  Oral  Oral  SpO2: 95% 93%  93%  Weight:  49.9 kg (109 lb 14.4 oz)  48.9 kg (107 lb 11.2 oz)  Height:       Respiratory: clear to auscultation bilaterally, no wheezing, no crackles. Normal respiratory effort. No accessory muscle use.  Cardiovascular: Regular rate and rhythm, no murmurs / rubs /  gallops. No LE edema. 2+ pedal pulses. Abdomen: no tenderness. Bowel sounds positive.  Musculoskeletal: no clubbing / cyanosis.  Neurologic: CN 2-12 grossly intact. Strength 5/5 in all 4.  Psychiatric: Normal judgment and insight. Alert and oriented x 3. Normal mood.    Data Reviewed: I have personally reviewed following labs and imaging studies  CBC:  Recent Labs Lab 03/09/16 2043  WBC 9.2  HGB 11.9*  HCT 34.9*  MCV 88.6  PLT 123456   Basic Metabolic Panel:  Recent Labs Lab 03/09/16 2043 03/11/16 0213  NA 136 134*  K 4.1 3.1*  CL 102 102  CO2 24 26  GLUCOSE 126* 130*  BUN 15 9  CREATININE 0.74 0.69  CALCIUM 9.4 8.6*   GFR: Estimated Creatinine Clearance: 35.3 mL/min (by C-G formula based on SCr of 0.69 mg/dL). Liver Function Tests: No results for input(s): AST, ALT, ALKPHOS, BILITOT, PROT, ALBUMIN in the last 168 hours. No results for  input(s): LIPASE, AMYLASE in the last 168 hours. No results for input(s): AMMONIA in the last 168 hours. Coagulation Profile: No results for input(s): INR, PROTIME in the last 168 hours. Cardiac Enzymes: No results for input(s): CKTOTAL, CKMB, CKMBINDEX, TROPONINI in the last 168 hours. BNP (last 3 results) No results for input(s): PROBNP in the last 8760 hours. HbA1C: No results for input(s): HGBA1C in the last 72 hours. CBG: No results for input(s): GLUCAP in the last 168 hours. Lipid Profile: No results for input(s): CHOL, HDL, LDLCALC, TRIG, CHOLHDL, LDLDIRECT in the last 72 hours. Thyroid Function Tests: No results for input(s): TSH, T4TOTAL, FREET4, T3FREE, THYROIDAB in the last 72 hours. Anemia Panel: No results for input(s): VITAMINB12, FOLATE, FERRITIN, TIBC, IRON, RETICCTPCT in the last 72 hours. Urine analysis:    Component Value Date/Time   COLORURINE YELLOW 03/05/2015 1549   APPEARANCEUR CLEAR 03/05/2015 1549   LABSPEC 1.010 03/05/2015 1549   PHURINE 6.5 03/05/2015 1549   GLUCOSEU NEGATIVE 03/05/2015 1549   HGBUR TRACE-INTACT (A) 03/05/2015 1549   HGBUR moderate 12/22/2009 1451   BILIRUBINUR Negative 10/14/2015 1535   KETONESUR NEGATIVE 03/05/2015 1549   PROTEINUR Negative 10/14/2015 1535   PROTEINUR NEGATIVE 01/05/2015 1815   UROBILINOGEN negative 10/14/2015 1535   UROBILINOGEN 0.2 03/05/2015 1549   NITRITE negative 10/14/2015 1535   NITRITE NEGATIVE 03/05/2015 1549   LEUKOCYTESUR Negative 10/14/2015 1535   Sepsis Labs: Invalid input(s): PROCALCITONIN, LACTICIDVEN  No results found for this or any previous visit (from the past 240 hour(s)).    Radiology Studies: Dg Chest 2 View  Result Date: 03/09/2016 CLINICAL DATA:  Shortness of breath EXAM: CHEST  2 VIEW COMPARISON:  Chest radiograph 07/29/2015 FINDINGS: Multiple pulmonary nodules are again seen, primarily within the left mid lung. The lungs are hyperinflated with diffusely increased pulmonary markings  suggestive of COPD. Left chest wall pacemaker leads are in radiographically appropriate position. No pleural effusion or pneumothorax. No focal airspace consolidation or pulmonary edema. IMPRESSION: 1. COPD and multiple pulmonary nodules, unchanged compared to 07/29/2015. The patient's most recent CT was 01/06/2015. A follow-up CT examination should be considered, as the appearance is concerning for metastatic disease. 2. No acute cardiopulmonary disease. Electronically Signed   By: Ulyses Jarred M.D.   On: 03/09/2016 22:02   Ct Chest W Contrast  Result Date: 03/10/2016 CLINICAL DATA:  82 y/o  F; multiple pulmonary nodules. EXAM: CT CHEST WITH CONTRAST TECHNIQUE: Multidetector CT imaging of the chest was performed during intravenous contrast administration. CONTRAST:  75 cc ISOVUE-300 IOPAMIDOL (ISOVUE-300) INJECTION  61% COMPARISON:  01/06/2015 CT chest.  03/09/2016 chest radiograph. FINDINGS: Cardiovascular: Normal caliber thoracic aorta. Extensive aortic atherosclerosis with both calcific and fibrofatty atherosclerosis. Normal caliber main pulmonary artery. Normal heart size. No pericardial effusion. Two lead pacemaker. Moderate coronary artery calcification. Mediastinum/Nodes: No enlarged mediastinal, hilar, or axillary lymph nodes. Thyroid gland, trachea, and esophagus demonstrate no significant findings. Lungs/Pleura: Trace right pleural effusion decreased from prior CT. There are numerous pulmonary nodules throughout the lungs (> than 20) which demonstrate course calcification throughout all lobes although more pronounced in the upper lung zones. In comparison with the prior CT of the chest some may be slightly enlarged for example in the superior segment of the left lower lobe (series 7, image 34) where the nodule measures 18 x 15 mm, previously 14 x 15 mm, however, this may also be due to differences in slice selection. Improved aeration within the long along the right lower mediastinum. Improved  aeration of the right posterior lower lobe due to near complete resolution of pleural effusion. No definite new pulmonary nodule is identified. There is a background of centrilobular emphysema and septal thickening in the lung apices. Upper Abdomen: Liver hypoattenuation with geographic areas of increased density at the peripheries probably representing steatosis and areas of focal sparing. Fluid attenuating partially visualized lesion within the gallbladder fossa of uncertain significance. Musculoskeletal: Stable T11 compression deformity. Vertebral body heights are otherwise preserved. No acute osseous abnormality is identified. IMPRESSION: 1. Numerous calcified pulmonary nodules are stable to minimally increased in size in comparison with the prior CT of the chest. Small differences in measurements may be due to differences in slice selection. No definite new pulmonary nodule. No mediastinal adenopathy. Findings may represent sequelae of granulomatous disease such as tuberculosis, histoplasmosis, or sarcoidosis. There is a upper lung predominance which may also be seen with occupational exposure. Metastatic disease is less likely given relative stability. 2. Diminished right pleural effusion with trace residual and improved aeration of the right lung. 3. Stable chronic T11 compression deformity. 4. Aortic and coronary artery atherosclerosis. 5. Partially visualized fluid attenuating structure within the gallbladder fossa. Given history of cholecystectomy this is of uncertain significance. Consider abdominal ultrasound for further characterization as clinically indicated. Electronically Signed   By: Kristine Garbe M.D.   On: 03/10/2016 00:03   Dg Foot Complete Left  Result Date: 03/09/2016 CLINICAL DATA:  Initial evaluation for acute trauma, fall. Edema about left foot EXAM: LEFT FOOT - COMPLETE 3+ VIEW COMPARISON:  None. FINDINGS: Severe osteopenia, somewhat limiting evaluation for possible subtle  acute nondisplaced fractures. No acute fracture or dislocation. Joint spaces maintained. No appreciable soft tissue swelling identified. Mild vascular occasions noted posterior to the ankle. IMPRESSION: 1. No acute osseous abnormality about the left foot. 2. Severe osteopenia. Electronically Signed   By: Jeannine Boga M.D.   On: 03/09/2016 22:00     Scheduled Meds: . diltiazem  240 mg Oral Daily  . enoxaparin (LOVENOX) injection  40 mg Subcutaneous Daily  . flecainide  50 mg Oral Q12H  . furosemide  20 mg Intravenous Daily  . loratadine  10 mg Oral Daily  . metoprolol succinate  25 mg Oral Daily  . multivitamin with minerals  1 tablet Oral Q1200  . sodium chloride flush  3 mL Intravenous Q12H   Continuous Infusions:   Marzetta Board, MD, PhD [facility] Pager [phone]  If 7PM-7AM, please contact night-coverage [email or web address] Password [email or web address] 03/11/2016, 12:51 PM   '

## 2016-03-11 NOTE — Care Management Note (Addendum)
Case Management Note  Patient Details  Name: Beth [name] MRN: [id number] Date of Birth: Dec 23, 1923  Subjective/Objective:     CM following for progression and d/c planning.                Action/Plan: 03/11/2016 Noted that pt met INPT criteria on 03/10/2016, therefore may qualify for SNF placement for short term rehab. Will followup up with unit CSW.  11am Spoke with pt re INPT status and she is pleased that she may qualify for short term SNF for rehab.  Called pt daughterIrene Pap @ [phone] per pt request as they were both very concerned that she may not be able to get the rehab that she was requesting. Spoke with Ms Anthony Sar and she also is pleased that SNF rehab may be an option. This pt has had short term rehab in the past at [facility] and would love to return to that facility.  Spoke with pt RN and with CSW, Caryl Pina who will followup with pt and facilitate d/c to SNF if appropriate.   Expected Discharge Date:     03/14/2016             Expected Discharge Plan:  [place]  In-House Referral:  Clinical Social Work  Discharge planning Services  CM Consult  Post Acute Care Choice:  Home Health Choice offered to:  Patient  DME Arranged:  N/A DME Agency:  NA  HH Arranged:  NA HH Agency:  [place], [place]  Status of Service:  In process, will continue to follow  If discussed at Long Length of Stay Meetings, dates discussed:    Additional Comments:  Adron Bene, RN 03/11/2016, 10:15 AM

## 2016-03-11 NOTE — Progress Notes (Signed)
CSW spoke with Ivin Booty from [facility]. Ivin Booty gave CSW a verbal acceptance of the patient and stated to contact Florentina Jenny tomorrow 03/12/16 prior to discharge to finish discharge plan.  Rhea Pink, MSW,  [place]

## 2016-03-11 NOTE — Progress Notes (Signed)
Orthopedic Tech Progress Note Patient Details:  Beth [name] 1923/04/27 [id number]  Ortho Devices Type of Ortho Device: CAM walker Ortho Device/Splint Location: LLE Ortho Device/Splint Interventions: Ordered, Application   Braulio Bosch 03/11/2016, 5:24 PM

## 2016-03-11 NOTE — Progress Notes (Signed)
Physical Therapy Treatment Patient Details Name: Beth [name] MRN: [id number] DOB: Jun 28, 1923 Today's Date: 03/11/2016    History of Present Illness  82 y.o. female with medical history significant of A.Fib, CHF with EF 40-45%, pacer, COPD, Lung nodules that are being just followed (and appear not to be doing very much despite being around for at least a year, maybe even 10 years from the records).  Patient presents to the ED with c/o SOB. Dx of acute on chronic CHF, fall, L ankle sprain.     PT Comments    Increased distance with ambulation today. No edema noted L ankle, she is tender to palpation at arch of foot. Performed A/AROM L ankle, she has full ROM. SaO2 92-94% on RA with ambulation, HR 84.   Follow Up Recommendations  SNF;Supervision for mobility/OOB (if pt declines SNF, then HHPT recommended)     Equipment Recommendations  None recommended by PT    Recommendations for Other Services       Precautions / Restrictions Precautions Precautions: Fall Precaution Comments: pt denies other falls in this past 1 year Restrictions Weight Bearing Restrictions: No    Mobility  Bed Mobility Overal bed mobility: Independent             General bed mobility comments: HOB elevated  Transfers Overall transfer level: Needs assistance Equipment used: Rolling walker (2 wheeled) Transfers: Sit to/from Stand Sit to Stand: Min guard         General transfer comment: min/guard safety  Ambulation/Gait Ambulation/Gait assistance: Supervision Ambulation Distance (Feet): 42 Feet Assistive device: Rolling walker (2 wheeled) Gait Pattern/deviations: Step-through pattern;Trunk flexed     General Gait Details: 5/10 L ankle pain with weight bearing, no LOB, SaO2 92-94% on RA walking, HR 84, 1/4 dyspnea, pt stated it felt difficult to breathe while walking but minimal dyspnea observed   Stairs            Wheelchair Mobility    Modified Rankin (Stroke Patients  Only)       Balance Overall balance assessment: Needs assistance;History of Falls Sitting-balance support: Feet supported;No upper extremity supported Sitting balance-Leahy Scale: Good     Standing balance support: Bilateral upper extremity supported Standing balance-Leahy Scale: Poor Standing balance comment: RW for support                    Cognition Arousal/Alertness: Awake/alert Behavior During Therapy: WFL for tasks assessed/performed Overall Cognitive Status: Within Functional Limits for tasks assessed                      Exercises General Exercises - Lower Extremity Ankle Circles/Pumps: AAROM;Left;10 reps;AROM (ankle pumps x 10, ankle circles x 10)    General Comments        Pertinent Vitals/Pain Pain Score: 5  Pain Location: arch of L foot Pain Descriptors / Indicators: Tender Pain Intervention(s): Limited activity within patient's tolerance;Monitored during session    Home Living                      Prior Function            PT Goals (current goals can now be found in the care plan section) Acute Rehab PT Goals Patient Stated Goal: to return home PT Goal Formulation: With patient Time For Goal Achievement: 03/24/16 Potential to Achieve Goals: Good Progress towards PT goals: Progressing toward goals    Frequency    Min 3X/week  PT Plan      Co-evaluation             End of Session Equipment Utilized During Treatment: Gait belt Activity Tolerance: Patient tolerated treatment well Patient left: in chair;with call bell/phone within reach;with chair alarm set     Time: [phone] PT Time Calculation (min) (ACUTE ONLY): 26 min  Charges:  $Gait Training: 8-22 mins $Therapeutic Exercise: 8-22 mins                    G Codes:      Philomena Doheny 03/11/2016, 9:37 AM [phone]

## 2016-03-12 LAB — BASIC METABOLIC PANEL
Anion gap: 8 (ref 5–15)
BUN: 11 mg/dL (ref 6–20)
CALCIUM: 8.9 mg/dL (ref 8.9–10.3)
CO2: 27 mmol/L (ref 22–32)
CREATININE: 0.7 mg/dL (ref 0.44–1.00)
Chloride: 103 mmol/L (ref 101–111)
GFR calc non Af Amer: >60 mL/min (ref >60)
GLUCOSE: 94 mg/dL (ref 65–99)
Potassium: 3.7 mmol/L (ref 3.5–5.1)
Sodium: 138 mmol/L (ref 135–145)

## 2016-03-12 NOTE — Progress Notes (Signed)
CSW notified by MD that pt will DC tomorrow. CSW contacted facility,[facility], bed will be available tomorrow per [place]. CSW will coordinate safe DC for pt to transfer on 12/17.  Nydia Ytuarte B. Joline Maxcy Clinical Social Work Dept Weekend Social Worker [phone] 11:48 AM

## 2016-03-12 NOTE — Progress Notes (Signed)
PROGRESS NOTE  Beth [name] [id number] DOB: 1924/02/09 DOA: 03/09/2016 PCP: Penni Homans, MD   LOS: 2 days   Brief Narrative: 82 yo F with history of A.Fib, CHF with EF 40-45%, pacer, COPD, Lung nodules (stable), admitted 12/14 with dyspnea, orthopnea, with 2 lb weight gain, with acute on chronic combined CHF  Assessment & Plan: Principal Problem:   Acute on chronic combined systolic and diastolic CHF (congestive heart failure) (HCC) Active Problems:   Essential hypertension   Pulmonary HTN   Multiple pulmonary nodules   Left ankle sprain   Dyspnea   Acute on chronic combined CHF - continue Lasix iv for today, she is improving, anticipate d/c tomorrow - clinically better  Recent falls - PT evaluation recommending SNF, d/w SW, can go Sunday  - left ankle without acute fractures  Pulmonary HTN - 2d echo as below  HTN - continue home medications  Pulmonary nodules - stable, outpatient follow up  Tobacco abuse, in remission - quit 2 years ago  Hypokalemia - due to Lasix, replete. Recheck BMP in am    DVT prophylaxis: Lovenox Code Status: DNR Family Communication: no family bedside Disposition Plan: SNF 1 day  Consultants:   None   Procedures:   2D echo Study Conclusions - Left ventricle: LVEF is approximately 50 to 55% with apical hypokinesis. The cavity size was normal. Wall thickness was increased in a pattern of mild LVH. Doppler parameters are consistent with abnormal left ventricular relaxation (grade 1 diastolic dysfunction). - Aortic valve: There was mild regurgitation. - Mitral valve: Calcified annulus. Mildly thickened leaflets . There was mild regurgitation. - Pulmonary arteries: PA peak pressure: 67 mm Hg (S).  Antimicrobials:  None    Subjective: - generally upset about everything. Complains of mild dyspnea, improved  Objective: Vitals:   03/11/16 1948 03/12/16 0553 03/12/16 1009 03/12/16 1010  BP: 98/84 (!) 125/47 (!)  139/55   Pulse: 70 64  76  Resp: 18 20    Temp: 98.4 F (36.9 C) 98 F (36.7 C)    TempSrc: Oral Oral    SpO2: 92% 92%    Weight:  48.4 kg (106 lb 9.6 oz)    Height:        Intake/Output Summary (Last 24 hours) at 03/12/16 1340 Last data filed at 03/12/16 1009  Gross per 24 hour  Intake              123 ml  Output                1 ml  Net              12 2 ml   Filed Weights   03/10/16 2030 03/11/16 0556 03/12/16 0553  Weight: 49.9 kg (109 lb 14.4 oz) 48.9 kg (107 lb 11.2 oz) 48.4 kg (106 lb 9.6 oz)    Examination: Constitutional: NAD Vitals:   03/11/16 1948 03/12/16 0553 03/12/16 1009 03/12/16 1010  BP: 98/84 (!) 125/47 (!) 139/55   Pulse: 70 64  76  Resp: 18 20    Temp: 98.4 F (36.9 C) 98 F (36.7 C)    TempSrc: Oral Oral    SpO2: 92% 92%    Weight:  48.4 kg (106 lb 9.6 oz)    Height:       Respiratory: clear to auscultation bilaterally, no wheezing, no crackles. Normal respiratory effort. No accessory muscle use.  Cardiovascular: Regular rate and rhythm, no murmurs / rubs / gallops. No LE edema. 2+ pedal  pulses. Abdomen: no tenderness. Bowel sounds positive.  Musculoskeletal: no clubbing / cyanosis.    Data Reviewed: I have personally reviewed following labs and imaging studies  CBC:  Recent Labs Lab 03/09/16 2043  WBC 9.2  HGB 11.9*  HCT 34.9*  MCV 88.6  PLT 123456   Basic Metabolic Panel:  Recent Labs Lab 03/09/16 2043 03/11/16 0213 03/12/16 0332  NA 136 134* 138  K 4.1 3.1* 3.7  CL 102 102 103  CO2 24 26 27   GLUCOSE 126* 130* 94  BUN 15 9 11   CREATININE 0.74 0.69 0.70  CALCIUM 9.4 8.6* 8.9   GFR: Estimated Creatinine Clearance: 35.3 mL/min (by C-G formula based on SCr of 0.7 mg/dL). Liver Function Tests: No results for input(s): AST, ALT, ALKPHOS, BILITOT, PROT, ALBUMIN in the last 168 hours. No results for input(s): LIPASE, AMYLASE in the last 168 hours. No results for input(s): AMMONIA in the last 168 hours. Coagulation Profile: No  results for input(s): INR, PROTIME in the last 168 hours. Cardiac Enzymes: No results for input(s): CKTOTAL, CKMB, CKMBINDEX, TROPONINI in the last 168 hours. BNP (last 3 results) No results for input(s): PROBNP in the last 8760 hours. HbA1C: No results for input(s): HGBA1C in the last 72 hours. CBG: No results for input(s): GLUCAP in the last 168 hours. Lipid Profile: No results for input(s): CHOL, HDL, LDLCALC, TRIG, CHOLHDL, LDLDIRECT in the last 72 hours. Thyroid Function Tests: No results for input(s): TSH, T4TOTAL, FREET4, T3FREE, THYROIDAB in the last 72 hours. Anemia Panel: No results for input(s): VITAMINB12, FOLATE, FERRITIN, TIBC, IRON, RETICCTPCT in the last 72 hours. Urine analysis:    Component Value Date/Time   COLORURINE YELLOW 03/05/2015 1549   APPEARANCEUR CLEAR 03/05/2015 1549   LABSPEC 1.010 03/05/2015 1549   PHURINE 6.5 03/05/2015 1549   GLUCOSEU NEGATIVE 03/05/2015 1549   HGBUR TRACE-INTACT (A) 03/05/2015 1549   HGBUR moderate 12/22/2009 1451   BILIRUBINUR Negative 10/14/2015 1535   KETONESUR NEGATIVE 03/05/2015 1549   PROTEINUR Negative 10/14/2015 1535   PROTEINUR NEGATIVE 01/05/2015 1815   UROBILINOGEN negative 10/14/2015 1535   UROBILINOGEN 0.2 03/05/2015 1549   NITRITE negative 10/14/2015 1535   NITRITE NEGATIVE 03/05/2015 1549   LEUKOCYTESUR Negative 10/14/2015 1535   Sepsis Labs: Invalid input(s): PROCALCITONIN, LACTICIDVEN  No results found for this or any previous visit (from the past 240 hour(s)).    Radiology Studies: No results found.   Scheduled Meds: . diltiazem  240 mg Oral Daily  . enoxaparin (LOVENOX) injection  40 mg Subcutaneous Daily  . flecainide  50 mg Oral Q12H  . furosemide  20 mg Intravenous Daily  . loratadine  10 mg Oral Daily  . metoprolol succinate  25 mg Oral Daily  . multivitamin with minerals  1 tablet Oral Q1200  . sodium chloride flush  3 mL Intravenous Q12H   Continuous Infusions:   Marzetta Board, MD,  PhD [facility] Pager [phone]  If 7PM-7AM, please contact night-coverage [email or web address] Password [email or web address] 03/12/2016, 1:40 PM   '

## 2016-03-13 DIAGNOSIS — M25572 Pain in left ankle and joints of left foot: Secondary | ICD-10-CM | POA: Diagnosis not present

## 2016-03-13 DIAGNOSIS — R5381 Other malaise: Secondary | ICD-10-CM | POA: Diagnosis not present

## 2016-03-13 DIAGNOSIS — R1312 Dysphagia, oropharyngeal phase: Secondary | ICD-10-CM | POA: Diagnosis not present

## 2016-03-13 DIAGNOSIS — I5043 Acute on chronic combined systolic (congestive) and diastolic (congestive) heart failure: Secondary | ICD-10-CM | POA: Diagnosis not present

## 2016-03-13 DIAGNOSIS — R911 Solitary pulmonary nodule: Secondary | ICD-10-CM | POA: Diagnosis not present

## 2016-03-13 DIAGNOSIS — D649 Anemia, unspecified: Secondary | ICD-10-CM | POA: Diagnosis not present

## 2016-03-13 DIAGNOSIS — I495 Sick sinus syndrome: Secondary | ICD-10-CM | POA: Diagnosis not present

## 2016-03-13 DIAGNOSIS — I4891 Unspecified atrial fibrillation: Secondary | ICD-10-CM | POA: Diagnosis not present

## 2016-03-13 DIAGNOSIS — E871 Hypo-osmolality and hyponatremia: Secondary | ICD-10-CM | POA: Diagnosis not present

## 2016-03-13 DIAGNOSIS — R2689 Other abnormalities of gait and mobility: Secondary | ICD-10-CM | POA: Diagnosis not present

## 2016-03-13 DIAGNOSIS — S93402D Sprain of unspecified ligament of left ankle, subsequent encounter: Secondary | ICD-10-CM | POA: Diagnosis not present

## 2016-03-13 DIAGNOSIS — Z950 Presence of cardiac pacemaker: Secondary | ICD-10-CM | POA: Diagnosis not present

## 2016-03-13 DIAGNOSIS — H9193 Unspecified hearing loss, bilateral: Secondary | ICD-10-CM | POA: Diagnosis not present

## 2016-03-13 DIAGNOSIS — J309 Allergic rhinitis, unspecified: Secondary | ICD-10-CM | POA: Diagnosis not present

## 2016-03-13 DIAGNOSIS — I272 Pulmonary hypertension, unspecified: Secondary | ICD-10-CM | POA: Diagnosis not present

## 2016-03-13 DIAGNOSIS — I10 Essential (primary) hypertension: Secondary | ICD-10-CM | POA: Diagnosis not present

## 2016-03-13 DIAGNOSIS — D638 Anemia in other chronic diseases classified elsewhere: Secondary | ICD-10-CM | POA: Diagnosis not present

## 2016-03-13 DIAGNOSIS — R296 Repeated falls: Secondary | ICD-10-CM | POA: Diagnosis not present

## 2016-03-13 DIAGNOSIS — M6281 Muscle weakness (generalized): Secondary | ICD-10-CM | POA: Diagnosis not present

## 2016-03-13 DIAGNOSIS — K219 Gastro-esophageal reflux disease without esophagitis: Secondary | ICD-10-CM | POA: Diagnosis not present

## 2016-03-13 DIAGNOSIS — S93402S Sprain of unspecified ligament of left ankle, sequela: Secondary | ICD-10-CM | POA: Diagnosis not present

## 2016-03-13 DIAGNOSIS — S93492A Sprain of other ligament of left ankle, initial encounter: Secondary | ICD-10-CM | POA: Diagnosis not present

## 2016-03-13 DIAGNOSIS — R0600 Dyspnea, unspecified: Secondary | ICD-10-CM | POA: Diagnosis not present

## 2016-03-13 DIAGNOSIS — I5042 Chronic combined systolic (congestive) and diastolic (congestive) heart failure: Secondary | ICD-10-CM | POA: Diagnosis not present

## 2016-03-13 DIAGNOSIS — R0602 Shortness of breath: Secondary | ICD-10-CM | POA: Diagnosis not present

## 2016-03-13 DIAGNOSIS — R918 Other nonspecific abnormal finding of lung field: Secondary | ICD-10-CM | POA: Diagnosis not present

## 2016-03-13 DIAGNOSIS — J3089 Other allergic rhinitis: Secondary | ICD-10-CM | POA: Diagnosis not present

## 2016-03-13 DIAGNOSIS — M6289 Other specified disorders of muscle: Secondary | ICD-10-CM | POA: Diagnosis not present

## 2016-03-13 DIAGNOSIS — R41841 Cognitive communication deficit: Secondary | ICD-10-CM | POA: Diagnosis not present

## 2016-03-13 LAB — BASIC METABOLIC PANEL
Anion gap: 8 (ref 5–15)
BUN: 14 mg/dL (ref 6–20)
CALCIUM: 8.9 mg/dL (ref 8.9–10.3)
CO2: 25 mmol/L (ref 22–32)
CREATININE: 0.67 mg/dL (ref 0.44–1.00)
Chloride: 101 mmol/L (ref 101–111)
GFR calc Af Amer: >60 mL/min (ref >60)
GFR calc non Af Amer: >60 mL/min (ref >60)
GLUCOSE: 87 mg/dL (ref 65–99)
Potassium: 3.7 mmol/L (ref 3.5–5.1)
Sodium: 134 mmol/L — ABNORMAL LOW (ref 135–145)

## 2016-03-13 NOTE — Progress Notes (Signed)
03/13/2016 12:56 PM Attempted to call report to [facility], was hung up on.  Will try again when time allows. [place]

## 2016-03-13 NOTE — Progress Notes (Addendum)
Pt ready for DC. CSW notified [facility] [place], bed ready. CSW updated nurse and set up transport with [facility], all forms and rpt# placed on pt's chart. DC Summary/Transfr Rpt faxed to facility. Dtr updated. Pt has no other needs at this time.   CSW is signing off.  Rydge Texidor B. Joline Maxcy Clinical Social Work Dept Weekend Social Worker [phone] 12:16 PM

## 2016-03-13 NOTE — Progress Notes (Signed)
03/13/2016 12:55 PM Pt discharged to [facility] via [place]. [place]

## 2016-03-13 NOTE — Discharge Summary (Signed)
Physician Discharge Summary  Beth [name] DOB: 07/09/1923 DOA: 03/09/2016  PCP: Penni Homans, MD  Admit date: 03/09/2016 Discharge date: 03/13/2016  Admitted From: home Disposition:  SNF  Recommendations for Outpatient Follow-up:  1. Follow up with PCP in 1-2 weeks   Discharge Condition: stable CODE STATUS: DNR Diet recommendation: low salt, heart healthy  HPI: per Dr. Bernerd Pho is a 82 y.o. female with medical history significant of A.Fib, CHF with EF 40-45%, pacer, COPD, Lung nodules that are being just followed (and appear not to be doing very much despite being around for at least a year, maybe even 10 years from the records).  Patient presents to the ED with c/o SOB.  Patients symptoms are worse with exertion or laying down flat.  Symptoms have been ongoing for the past 2-3 days.  Last took diuretic that she takes PRN at home some 6 days ago.  Currently 2 lbs above baseline. She also has had a fall yesterday, and L ankle pain since that time, worse with weight bearing.  Minimally ambulatory since fall. ED Course: CT chest shows trace R pleural effusion, lung nodules not doing a whole lot since October of last year.  X ray of ankle is negative.  Hospital Course: Discharge Diagnoses:  Principal Problem:   Acute on chronic combined systolic and diastolic CHF (congestive heart failure) (HCC) Active Problems:   Essential hypertension   Pulmonary HTN   Multiple pulmonary nodules   Left ankle sprain   Dyspnea  Acute on chronic combined CHF - patient diuresed with IV Lasix with good UOP, weight has improved from 109 to 105 and appears euvolemic on discharge. Continue Lasix prn, please weigh patient on a regular basis Recent falls - PT evaluation recommending SNF. Left ankle without acute fractures Pulmonary HTN - 2d echo as below, Lasix prn HTN - continue home medications Pulmonary nodules - stable, outpatient follow up Tobacco abuse, in  remission - quit 2 years ago Hypokalemia - due to Lasix, within normal limits on discharge   Discharge Instructions   Allergies as of 03/13/2016      Reactions   Penicillins Shortness Of Breath, Diarrhea, Other (See Comments)   Stomach cramping   Ciprofloxacin Other (See Comments)   myalgias   Fexofenadine Other (See Comments)   unknown      Medication List    TAKE these medications   aspirin 81 MG chewable tablet Chew 81 mg by mouth daily as needed (TAKES OCCASIONALLY).   cetirizine 10 MG tablet Commonly known as:  ZYRTEC Take 10 mg by mouth every morning.   diltiazem 240 MG 24 hr capsule Commonly known as:  TIAZAC TAKE 1 CAPSULE (240 MG TOTAL) BY MOUTH DAILY.   fish oil-omega-3 fatty acids 1000 MG capsule Take 1 g by mouth daily at 12 noon.   flecainide 50 MG tablet Commonly known as:  TAMBOCOR TAKE 1 TABLET BY MOUTH EVERY 12 HOURS   furosemide 20 MG tablet Commonly known as:  LASIX TAKE 1 TABLET (20 MG TOTAL) BY MOUTH DAILY AS NEEDED FOR EDEMA.   metoprolol succinate 25 MG 24 hr tablet Commonly known as:  TOPROL-XL TAKE 1 TABLET (25 MG TOTAL) BY MOUTH DAILY.   multivitamin tablet Take 1 tablet by mouth daily at 12 noon.   potassium chloride 10 MEQ tablet Commonly known as:  K-DUR,KLOR-CON Take 1 tablet (10 mEq total) by mouth daily as needed (for when you take Lasix).   PROBIOTIC ADVANCED Caps Take  2 capsules by mouth daily.      Follow-up Information    Penni Homans, MD. Schedule an appointment as soon as possible for a visit in 1 week(s).   Specialty:  Family Medicine Contact information: [address] [place] [address] [phone]          Allergies  Allergen Reactions  . Penicillins Shortness Of Breath, Diarrhea and Other (See Comments)    Stomach cramping  . Ciprofloxacin Other (See Comments)    myalgias  . Fexofenadine Other (See Comments)    unknown    Consultations:  None   Procedures/Studies:  2D  echo  Study Conclusions - Left ventricle: LVEF is approximately 50 to 55% with apical hypokinesis. The cavity size was normal. Wall thickness was increased in a pattern of mild LVH. Doppler parameters are consistent with abnormal left ventricular relaxation (grade 1 diastolic dysfunction). - Aortic valve: There was mild regurgitation. - Mitral valve: Calcified annulus. Mildly thickened leaflets. There was mild regurgitation. - Pulmonary arteries: PA peak pressure: 67 mm Hg (S).  Dg Chest 2 View  Result Date: 03/09/2016 CLINICAL DATA:  Shortness of breath EXAM: CHEST  2 VIEW COMPARISON:  Chest radiograph 07/29/2015 FINDINGS: Multiple pulmonary nodules are again seen, primarily within the left mid lung. The lungs are hyperinflated with diffusely increased pulmonary markings suggestive of COPD. Left chest wall pacemaker leads are in radiographically appropriate position. No pleural effusion or pneumothorax. No focal airspace consolidation or pulmonary edema. IMPRESSION: 1. COPD and multiple pulmonary nodules, unchanged compared to 07/29/2015. The patient's most recent CT was 01/06/2015. A follow-up CT examination should be considered, as the appearance is concerning for metastatic disease. 2. No acute cardiopulmonary disease. Electronically Signed   By: Ulyses Jarred M.D.   On: 03/09/2016 22:02   Ct Chest W Contrast  Result Date: 03/10/2016 CLINICAL DATA:  82 y/o  F; multiple pulmonary nodules. EXAM: CT CHEST WITH CONTRAST TECHNIQUE: Multidetector CT imaging of the chest was performed during intravenous contrast administration. CONTRAST:  75 cc ISOVUE-300 IOPAMIDOL (ISOVUE-300) INJECTION 61% COMPARISON:  01/06/2015 CT chest.  03/09/2016 chest radiograph. FINDINGS: Cardiovascular: Normal caliber thoracic aorta. Extensive aortic atherosclerosis with both calcific and fibrofatty atherosclerosis. Normal caliber main pulmonary artery. Normal heart size. No pericardial effusion. Two lead pacemaker. Moderate  coronary artery calcification. Mediastinum/Nodes: No enlarged mediastinal, hilar, or axillary lymph nodes. Thyroid gland, trachea, and esophagus demonstrate no significant findings. Lungs/Pleura: Trace right pleural effusion decreased from prior CT. There are numerous pulmonary nodules throughout the lungs (> than 20) which demonstrate course calcification throughout all lobes although more pronounced in the upper lung zones. In comparison with the prior CT of the chest some may be slightly enlarged for example in the superior segment of the left lower lobe (series 7, image 34) where the nodule measures 18 x 15 mm, previously 14 x 15 mm, however, this may also be due to differences in slice selection. Improved aeration within the long along the right lower mediastinum. Improved aeration of the right posterior lower lobe due to near complete resolution of pleural effusion. No definite new pulmonary nodule is identified. There is a background of centrilobular emphysema and septal thickening in the lung apices. Upper Abdomen: Liver hypoattenuation with geographic areas of increased density at the peripheries probably representing steatosis and areas of focal sparing. Fluid attenuating partially visualized lesion within the gallbladder fossa of uncertain significance. Musculoskeletal: Stable T11 compression deformity. Vertebral body heights are otherwise preserved. No acute osseous abnormality is  identified. IMPRESSION: 1. Numerous calcified pulmonary nodules are stable to minimally increased in size in comparison with the prior CT of the chest. Small differences in measurements may be due to differences in slice selection. No definite new pulmonary nodule. No mediastinal adenopathy. Findings may represent sequelae of granulomatous disease such as tuberculosis, histoplasmosis, or sarcoidosis. There is a upper lung predominance which may also be seen with occupational exposure. Metastatic disease is less likely given  relative stability. 2. Diminished right pleural effusion with trace residual and improved aeration of the right lung. 3. Stable chronic T11 compression deformity. 4. Aortic and coronary artery atherosclerosis. 5. Partially visualized fluid attenuating structure within the gallbladder fossa. Given history of cholecystectomy this is of uncertain significance. Consider abdominal ultrasound for further characterization as clinically indicated. Electronically Signed   By: Kristine Garbe M.D.   On: 03/10/2016 00:03   Dg Foot Complete Left  Result Date: 03/09/2016 CLINICAL DATA:  Initial evaluation for acute trauma, fall. Edema about left foot EXAM: LEFT FOOT - COMPLETE 3+ VIEW COMPARISON:  None. FINDINGS: Severe osteopenia, somewhat limiting evaluation for possible subtle acute nondisplaced fractures. No acute fracture or dislocation. Joint spaces maintained. No appreciable soft tissue swelling identified. Mild vascular occasions noted posterior to the ankle. IMPRESSION: 1. No acute osseous abnormality about the left foot. 2. Severe osteopenia. Electronically Signed   By: Jeannine Boga M.D.   On: 03/09/2016 22:00      Subjective: - no chest pain, shortness of breath, no abdominal pain, nausea or vomiting.   Discharge Exam: Vitals:   03/12/16 2029 03/13/16 0444  BP: (!) 149/49 (!) 128/54  Pulse: 72   Resp: 18 17  Temp: 97.8 F (36.6 C) 97.5 F (36.4 C)   Vitals:   03/12/16 1010 03/12/16 1546 03/12/16 2029 03/13/16 0444  BP:  (!) 128/48 (!) 149/49 (!) 128/54  Pulse: 76 60 72   Resp:  18 18 17   Temp:  97.8 F (36.6 C) 97.8 F (36.6 C) 97.5 F (36.4 C)  TempSrc:  Oral Oral Oral  SpO2:  95% 93% 90%  Weight:    48 kg (105 lb 12.8 oz)  Height:        General: Pt is alert, awake, not in acute distress Cardiovascular: RRR, S1/S2 +, no rubs, no gallops Respiratory: CTA bilaterally, no wheezing, no rhonchi Abdominal: Soft, NT, ND, bowel sounds + Extremities: no edema, no  cyanosis    The results of significant diagnostics from this hospitalization (including imaging, microbiology, ancillary and laboratory) are listed below for reference.     Microbiology: No results found for this or any previous visit (from the past 240 hour(s)).   Labs: BNP (last 3 results)  Recent Labs  03/09/16 0030  BNP [id number]*   Basic Metabolic Panel:  Recent Labs Lab 03/09/16 2043 03/11/16 0213 03/12/16 0332 03/13/16 0215  NA 136 134* 138 134*  K 4.1 3.1* 3.7 3.7  CL 102 102 103 101  CO2 24 26 27 25   GLUCOSE 126* 130* 94 87  BUN 15 9 11 14   CREATININE 0.74 0.69 0.70 0.67  CALCIUM 9.4 8.6* 8.9 8.9   Liver Function Tests: No results for input(s): AST, ALT, ALKPHOS, BILITOT, PROT, ALBUMIN in the last 168 hours. No results for input(s): LIPASE, AMYLASE in the last 168 hours. No results for input(s): AMMONIA in the last 168 hours. CBC:  Recent Labs Lab 03/09/16 2043  WBC 9.2  HGB 11.9*  HCT 34.9*  MCV 88.6  PLT 260  Cardiac Enzymes: No results for input(s): CKTOTAL, CKMB, CKMBINDEX, TROPONINI in the last 168 hours. BNP: Invalid input(s): POCBNP CBG: No results for input(s): GLUCAP in the last 168 hours. D-Dimer No results for input(s): DDIMER in the last 72 hours. Hgb A1c No results for input(s): HGBA1C in the last 72 hours. Lipid Profile No results for input(s): CHOL, HDL, LDLCALC, TRIG, CHOLHDL, LDLDIRECT in the last 72 hours. Thyroid function studies No results for input(s): TSH, T4TOTAL, T3FREE, THYROIDAB in the last 72 hours.  Invalid input(s): FREET3 Anemia work up No results for input(s): VITAMINB12, FOLATE, FERRITIN, TIBC, IRON, RETICCTPCT in the last 72 hours. Urinalysis    Component Value Date/Time   COLORURINE YELLOW 03/05/2015 1549   APPEARANCEUR CLEAR 03/05/2015 1549   LABSPEC 1.010 03/05/2015 1549   PHURINE 6.5 03/05/2015 1549   GLUCOSEU NEGATIVE 03/05/2015 1549   HGBUR TRACE-INTACT (A) 03/05/2015 1549   HGBUR moderate  12/22/2009 1451   BILIRUBINUR Negative 10/14/2015 1535   KETONESUR NEGATIVE 03/05/2015 1549   PROTEINUR Negative 10/14/2015 1535   PROTEINUR NEGATIVE 01/05/2015 1815   UROBILINOGEN negative 10/14/2015 1535   UROBILINOGEN 0.2 03/05/2015 1549   NITRITE negative 10/14/2015 1535   NITRITE NEGATIVE 03/05/2015 1549   LEUKOCYTESUR Negative 10/14/2015 1535   Sepsis Labs Invalid input(s): PROCALCITONIN,  WBC,  LACTICIDVEN Microbiology No results found for this or any previous visit (from the past 240 hour(s)).   Time coordinating discharge: Over 30 minutes  SIGNED:  Marzetta Board, MD  [facility] 03/13/2016, 9:33 AM Pager [phone]  If 7PM-7AM, please contact night-coverage [email or web address] Password [email or web address]

## 2016-03-14 ENCOUNTER — Non-Acute Institutional Stay (SKILLED_NURSING_FACILITY): Payer: Medicare Other | Admitting: Adult Health

## 2016-03-14 ENCOUNTER — Encounter: Payer: Self-pay | Admitting: Adult Health

## 2016-03-14 ENCOUNTER — Other Ambulatory Visit: Payer: Self-pay | Admitting: Family Medicine

## 2016-03-14 ENCOUNTER — Telehealth: Payer: Self-pay | Admitting: Family Medicine

## 2016-03-14 DIAGNOSIS — I10 Essential (primary) hypertension: Secondary | ICD-10-CM | POA: Diagnosis not present

## 2016-03-14 DIAGNOSIS — I5043 Acute on chronic combined systolic (congestive) and diastolic (congestive) heart failure: Secondary | ICD-10-CM | POA: Diagnosis not present

## 2016-03-14 DIAGNOSIS — I4891 Unspecified atrial fibrillation: Secondary | ICD-10-CM | POA: Diagnosis not present

## 2016-03-14 DIAGNOSIS — E871 Hypo-osmolality and hyponatremia: Secondary | ICD-10-CM

## 2016-03-14 DIAGNOSIS — J309 Allergic rhinitis, unspecified: Secondary | ICD-10-CM

## 2016-03-14 DIAGNOSIS — H9193 Unspecified hearing loss, bilateral: Secondary | ICD-10-CM

## 2016-03-14 DIAGNOSIS — D649 Anemia, unspecified: Secondary | ICD-10-CM

## 2016-03-14 DIAGNOSIS — S93402S Sprain of unspecified ligament of left ankle, sequela: Secondary | ICD-10-CM | POA: Diagnosis not present

## 2016-03-14 DIAGNOSIS — R5381 Other malaise: Secondary | ICD-10-CM | POA: Diagnosis not present

## 2016-03-14 NOTE — Progress Notes (Signed)
DATE:  03/14/2016   MRN:  [id number]  BIRTHDAY: 05-17-1923  Facility:  [facility]ursing Home Location:  [place] Room Number: 208-P  LEVEL OF CARE:  SNF (31)  Contact Information    Name Relation Home Work Mobile   [place] Daughter [phone]     [place] Reek Relative [phone]         Code Status History    Date Active Date Inactive Code Status Order ID Comments User Context   03/10/2016  1:15 AM 03/13/2016  3:52 PM DNR [id number]  Etta Quill, DO ED   01/06/2015  1:37 AM 01/09/2015 10:10 PM Partial Code [id number]  Rise Patience, MD Inpatient   11/17/2014  6:17 AM 11/18/2014  8:29 PM DNR [id number]  Theressa Millard, MD ED   11/17/2014  6:08 AM 11/17/2014  6:17 AM Full Code [id number]  Theressa Millard, MD ED   09/26/2014  8:02 PM 09/30/2014  5:31 PM DNR [id number]  Ritta Slot, NP Inpatient   09/26/2014  5:23 PM 09/26/2014  8:02 PM Full Code [id number]  Evans Lance, MD Inpatient   09/25/2014  5:23 PM 09/26/2014  5:23 PM DNR [id number]  Samella Parr, NP ED   07/13/2013  7:52 PM 09/25/2014  5:23 PM Full Code [id number]  Hennie Duos, MD Outpatient   06/28/2013  9:36 PM 07/02/2013  6:43 PM Full Code [id number]  Etta Quill, DO Inpatient    Questions for Most Recent Historical Code Status (Order [id number])    Question Answer Comment   In the event of cardiac or respiratory ARREST Do not call a "code blue"    In the event of cardiac or respiratory ARREST Do not perform Intubation, CPR, defibrillation or ACLS    In the event of cardiac or respiratory ARREST Use medication by any route, position, wound care, and other measures to relive pain and suffering. May use oxygen, suction and manual treatment of airway obstruction as needed for comfort.    Comments Confirmed with patient and daughter         Advance Directive Documentation   Flowsheet Row Most Recent Value  Type of Advance Directive  Out of facility DNR (pink MOST or yellow form)    Pre-existing out of facility DNR order (yellow form or pink MOST form)  No data  "MOST" Form in Place?  No data       Chief Complaint  Patient presents with  . Hospitalization Follow-up    HISTORY OF PRESENT ILLNESS:  This is a 82 year old female admitted to [facility] on 03/13/16 following an admission at [facility] 12/13-12/17/17 for complaints of SOB and fall with minimal ambulation subsequent to fall.  CXR showed trace right pleural effusion and unchanged lung nodules from October 2016.  Ankle x-ray was negative. She was diuresed with IV Lasix.    She is admitted for a short-term rehabilitation    PAST MEDICAL HISTORY:  Past Medical History:  Diagnosis Date  . Abdominal pain, unspecified site 08/17/2012  . Allergic state 06/22/2013  . Allergy   . Arthritis   . Atrial fibrillation with rapid ventricular response ([place])   . COPD (chronic obstructive pulmonary disease) ([place])   . Glaucoma 07/06/2014  . Hyperglycemia 02/01/2016  . Hypertension   . Increased urinary frequency 01/02/2014  . Loss of weight 01/19/2013  . Lung nodule    CT of chest 2007-left loewr lobar mass- evaluated by pulmonary- patient refused further  work up  . Osteoporosis   . Other and unspecified hyperlipidemia 08/17/2012  . Syncope    fall  . Systolic CHF ([place])    EF [id number] 09/2014  . Tobacco abuse   . Vertigo      CURRENT MEDICATIONS: Reviewed  Patient's Medications  New Prescriptions   No medications on file  Previous Medications   ASPIRIN 81 MG CHEWABLE TABLET    Chew 81 mg by mouth daily as needed.    CETIRIZINE (ZYRTEC) 10 MG TABLET    Take 10 mg by mouth every morning.   DILTIAZEM (TIAZAC) 240 MG 24 HR CAPSULE    TAKE 1 CAPSULE (240 MG TOTAL) BY MOUTH DAILY.   FISH OIL-OMEGA-3 FATTY ACIDS 1000 MG CAPSULE    Take 1 g by mouth daily at 12 noon.    FLECAINIDE (TAMBOCOR) 50 MG TABLET    TAKE 1 TABLET BY MOUTH EVERY 12 HOURS   FUROSEMIDE (LASIX) 20 MG TABLET    TAKE 1 TABLET (20 MG  TOTAL) BY MOUTH DAILY AS NEEDED FOR EDEMA.   METOPROLOL SUCCINATE (TOPROL-XL) 25 MG 24 HR TABLET    TAKE 1 TABLET (25 MG TOTAL) BY MOUTH DAILY.   METOPROLOL SUCCINATE (TOPROL-XL) 25 MG 24 HR TABLET    TAKE 1 TABLET BY MOUTH EVERY DAY   MULTIPLE VITAMIN (MULTIVITAMIN) TABLET    Take 1 tablet by mouth daily at 12 noon.    POTASSIUM CHLORIDE (K-DUR,KLOR-CON) 10 MEQ TABLET    Take 1 tablet (10 mEq total) by mouth daily as needed (for when you take Lasix).   PROBIOTIC PRODUCT (PROBIOTIC ADVANCED) CAPS    Take 2 capsules by mouth daily. Florastor 250 mg  Modified Medications   No medications on file  Discontinued Medications   No medications on file     Allergies  Allergen Reactions  . Penicillins Shortness Of Breath, Diarrhea and Other (See Comments)    Stomach cramping  . Ciprofloxacin Other (See Comments)    myalgias  . Fexofenadine Other (See Comments)    unknown     REVIEW OF SYSTEMS:  GENERAL: no change in appetite, no fatigue, no weight changes, no fever, chills or weakness EYES: Denies change in vision, dry eyes, eye pain, itching or discharge EARS: Denies change in hearing, ringing in ears, or earache NOSE: Denies nasal congestion or epistaxis MOUTH and THROAT: Denies oral discomfort, gingival pain or bleeding, pain from teeth or hoarseness   RESPIRATORY: no cough, SOB, DOE, wheezing, hemoptysis CARDIAC: no chest pain, edema or palpitations GI: no abdominal pain, diarrhea, constipation, heart burn, nausea or vomiting GU: Denies dysuria, frequency, hematuria, incontinence, or discharge PSYCHIATRIC: Denies feeling of depression or anxiety. No report of hallucinations, insomnia, paranoia, or agitation    PHYSICAL EXAMINATION  GENERAL APPEARANCE: Well nourished. In no acute distress. Normal body habitus SKIN:  Skin is warm and dry.  HEAD: Normal in size and contour. No evidence of trauma EYES: Lids open and close normally. No blepharitis, entropion or ectropion. PERRL.  Conjunctivae are clear and sclerae are white. Lenses are without opacity EARS: Pinnae are normal. Hard of hearing even with bilateral hearing aides MOUTH and THROAT: Lips are without lesions. Oral mucosa is moist and without lesions. Tongue is normal in shape, size, and color and without lesions NECK: supple, trachea midline, no neck masses, no thyroid tenderness, no thyromegaly LYMPHATICS: no LAN in the neck, no supraclavicular LAN RESPIRATORY: breathing is even & unlabored, BS CTAB CARDIAC: RRR, no murmur,no extra heart sounds,  no edema, left chest pacemaker GI: abdomen soft, normal BS, no masses, no tenderness, no hepatomegaly, no splenomegaly EXTREMITIES:  Able to move 4 extremities, left CAM boot PSYCHIATRIC: Alert and oriented X 3. Affect and behavior are appropriate  LABS/RADIOLOGY: Labs reviewed: Basic Metabolic Panel:  Recent Labs  03/11/16 0213 03/12/16 0332 03/13/16 0215  NA 134* 138 134*  K 3.1* 3.7 3.7  CL 102 103 101  CO2 26 27 25   GLUCOSE 130* 94 87  BUN 9 11 14   CREATININE 0.69 0.70 0.67  CALCIUM 8.6* 8.9 8.9   Liver Function Tests:  Recent Labs  02/01/16 1234  AST 20  ALT 13  ALKPHOS 74  BILITOT 0.5  PROT 7.8  ALBUMIN 4.3    CBC:  Recent Labs  02/01/16 1234 03/09/16 2043  WBC 8.2 9.2  HGB 13.2 11.9*  HCT 39.1 34.9*  MCV 88.5 88.6  PLT 302.0 260   Lipid Panel:  Recent Labs  02/01/16 1234  HDL 76.80    Dg Chest 2 View  Result Date: 03/09/2016 CLINICAL DATA:  Shortness of breath EXAM: CHEST  2 VIEW COMPARISON:  Chest radiograph 07/29/2015 FINDINGS: Multiple pulmonary nodules are again seen, primarily within the left mid lung. The lungs are hyperinflated with diffusely increased pulmonary markings suggestive of COPD. Left chest wall pacemaker leads are in radiographically appropriate position. No pleural effusion or pneumothorax. No focal airspace consolidation or pulmonary edema. IMPRESSION: 1. COPD and multiple pulmonary nodules,  unchanged compared to 07/29/2015. The patient's most recent CT was 01/06/2015. A follow-up CT examination should be considered, as the appearance is concerning for metastatic disease. 2. No acute cardiopulmonary disease. Electronically Signed   By: Ulyses Jarred M.D.   On: 03/09/2016 22:02   Ct Chest W Contrast  Result Date: 03/10/2016 CLINICAL DATA:  82 y/o  F; multiple pulmonary nodules. EXAM: CT CHEST WITH CONTRAST TECHNIQUE: Multidetector CT imaging of the chest was performed during intravenous contrast administration. CONTRAST:  75 cc ISOVUE-300 IOPAMIDOL (ISOVUE-300) INJECTION 61% COMPARISON:  01/06/2015 CT chest.  03/09/2016 chest radiograph. FINDINGS: Cardiovascular: Normal caliber thoracic aorta. Extensive aortic atherosclerosis with both calcific and fibrofatty atherosclerosis. Normal caliber main pulmonary artery. Normal heart size. No pericardial effusion. Two lead pacemaker. Moderate coronary artery calcification. Mediastinum/Nodes: No enlarged mediastinal, hilar, or axillary lymph nodes. Thyroid gland, trachea, and esophagus demonstrate no significant findings. Lungs/Pleura: Trace right pleural effusion decreased from prior CT. There are numerous pulmonary nodules throughout the lungs (> than 20) which demonstrate course calcification throughout all lobes although more pronounced in the upper lung zones. In comparison with the prior CT of the chest some may be slightly enlarged for example in the superior segment of the left lower lobe (series 7, image 34) where the nodule measures 18 x 15 mm, previously 14 x 15 mm, however, this may also be due to differences in slice selection. Improved aeration within the long along the right lower mediastinum. Improved aeration of the right posterior lower lobe due to near complete resolution of pleural effusion. No definite new pulmonary nodule is identified. There is a background of centrilobular emphysema and septal thickening in the lung apices. Upper  Abdomen: Liver hypoattenuation with geographic areas of increased density at the peripheries probably representing steatosis and areas of focal sparing. Fluid attenuating partially visualized lesion within the gallbladder fossa of uncertain significance. Musculoskeletal: Stable T11 compression deformity. Vertebral body heights are otherwise preserved. No acute osseous abnormality is identified. IMPRESSION: 1. Numerous calcified pulmonary nodules are stable to minimally  increased in size in comparison with the prior CT of the chest. Small differences in measurements may be due to differences in slice selection. No definite new pulmonary nodule. No mediastinal adenopathy. Findings may represent sequelae of granulomatous disease such as tuberculosis, histoplasmosis, or sarcoidosis. There is a upper lung predominance which may also be seen with occupational exposure. Metastatic disease is less likely given relative stability. 2. Diminished right pleural effusion with trace residual and improved aeration of the right lung. 3. Stable chronic T11 compression deformity. 4. Aortic and coronary artery atherosclerosis. 5. Partially visualized fluid attenuating structure within the gallbladder fossa. Given history of cholecystectomy this is of uncertain significance. Consider abdominal ultrasound for further characterization as clinically indicated. Electronically Signed   By: Kristine Garbe M.D.   On: 03/10/2016 00:03   Dg Foot Complete Left  Result Date: 03/09/2016 CLINICAL DATA:  Initial evaluation for acute trauma, fall. Edema about left foot EXAM: LEFT FOOT - COMPLETE 3+ VIEW COMPARISON:  None. FINDINGS: Severe osteopenia, somewhat limiting evaluation for possible subtle acute nondisplaced fractures. No acute fracture or dislocation. Joint spaces maintained. No appreciable soft tissue swelling identified. Mild vascular occasions noted posterior to the ankle. IMPRESSION: 1. No acute osseous abnormality about  the left foot. 2. Severe osteopenia. Electronically Signed   By: Jeannine Boga M.D.   On: 03/09/2016 22:00    ASSESSMENT/PLAN:  Physical deconditioning -  for rehabilitation, PT and OT, for therapeutic strengthening exercises; fall precautions  Left ankle sprain -  S/P fall with x-ray of left ankle negative for fracture; continue cam boot  Acute on chronic combined systolic and diastolic CHF - no SOB; she was diuresed with IV Lasix; continue Lasix 20 mg 1 tab by mouth daily when necessary and KCl when necessary when Lasix is given and metoprolol succinate 25 mg 24 HR 1 tab by mouth daily   Allergic rhinitis - continue Zyrtec 10 mg 1 tab PO Q AM  Hard of hearing - encourage to use bilateral hearing aides daily  and encourage staff to talk facing the patient  Anemia -  Will re-check CBC in 1 week Lab Results  Component Value Date   HGB 11.9 (L) 03/09/2016   Hyponatremia - NA 134 ; check BMP in 1 week  Hypertension - continue Tiazac 240 mg 24 hour 1 capsule by mouth every 12 noon and metoprolol succinate 24-hour 25 mg 1 tab by mouth daily  Atrial fibrillation - rate controlled; continue Tiazac 240 mg 24 hour 1 capsule by mouth every 12 noon, metoprolol succinate 25 mg 24 hour 1 tab by mouth daily and Tambocor 50 mg 1 tab by mouth every 12 hours     Goals of care:  Short-term rehabilitation   Monina C. Christmas - NP  [employer] [phone]

## 2016-03-14 NOTE — Telephone Encounter (Signed)
Loranee with [facility] called to inform Dr. Charlett Blake that the patient has been discharged from the hospital to an assisted living for short term rehab. Patient will be needing a transition of care and follow up when discharged from assisted living. Please advise.   [place] Phone: [phone]

## 2016-03-14 NOTE — Consult Note (Signed)
            [facility] CM Primary Care Navigator  03/14/2016  Beth [name] Aug 17, 1923 [id number]   Went to see patient at bedside to identify possible discharge needs but nursing staff states that patient had been discharged.  Patient was discharged to skilled nursing facility ([place] [facility]) for short term rehabilitation.  Primary care provider's office called(Kittie)to notify of patient's discharge to [facility] andneed for post SNF follow-up and transition of care. Made aware to refer patient to [facility] care management for care coordination needs (COPD, HF) if deemed appropriate.  For additional questions please contact:  Edwena Felty A. Dvid Pendry, BSN, RN-BC [facility] PRIMARY CARE Navigator Cell: [phone]

## 2016-03-15 ENCOUNTER — Encounter: Payer: Self-pay | Admitting: Internal Medicine

## 2016-03-15 ENCOUNTER — Telehealth: Payer: Self-pay | Admitting: Cardiology

## 2016-03-15 ENCOUNTER — Ambulatory Visit (INDEPENDENT_AMBULATORY_CARE_PROVIDER_SITE_OTHER): Payer: Medicare Other | Admitting: *Deleted

## 2016-03-15 ENCOUNTER — Non-Acute Institutional Stay (SKILLED_NURSING_FACILITY): Payer: Medicare Other | Admitting: Internal Medicine

## 2016-03-15 DIAGNOSIS — R5381 Other malaise: Secondary | ICD-10-CM

## 2016-03-15 DIAGNOSIS — E871 Hypo-osmolality and hyponatremia: Secondary | ICD-10-CM

## 2016-03-15 DIAGNOSIS — R911 Solitary pulmonary nodule: Secondary | ICD-10-CM

## 2016-03-15 DIAGNOSIS — I10 Essential (primary) hypertension: Secondary | ICD-10-CM

## 2016-03-15 DIAGNOSIS — K219 Gastro-esophageal reflux disease without esophagitis: Secondary | ICD-10-CM

## 2016-03-15 DIAGNOSIS — I495 Sick sinus syndrome: Secondary | ICD-10-CM | POA: Diagnosis not present

## 2016-03-15 DIAGNOSIS — Z950 Presence of cardiac pacemaker: Secondary | ICD-10-CM

## 2016-03-15 DIAGNOSIS — I272 Pulmonary hypertension, unspecified: Secondary | ICD-10-CM | POA: Diagnosis not present

## 2016-03-15 DIAGNOSIS — M25572 Pain in left ankle and joints of left foot: Secondary | ICD-10-CM | POA: Diagnosis not present

## 2016-03-15 DIAGNOSIS — D638 Anemia in other chronic diseases classified elsewhere: Secondary | ICD-10-CM | POA: Diagnosis not present

## 2016-03-15 DIAGNOSIS — I442 Atrioventricular block, complete: Secondary | ICD-10-CM

## 2016-03-15 DIAGNOSIS — I5042 Chronic combined systolic (congestive) and diastolic (congestive) heart failure: Secondary | ICD-10-CM

## 2016-03-15 DIAGNOSIS — R1312 Dysphagia, oropharyngeal phase: Secondary | ICD-10-CM | POA: Diagnosis not present

## 2016-03-15 NOTE — Progress Notes (Signed)
LOCATION: [facility]  PCP: Penni Homans, MD   Code Status: DNR  Goals of care: Advanced Directive information Advanced Directives 03/14/2016  Does Patient Have a Medical Advance Directive? Yes  Type of Advance Directive Out of facility DNR (pink MOST or yellow form)  Does patient want to make changes to medical advance directive? No - Patient declined  Copy of [place] in Chart? No - copy requested  Would patient like information on creating a medical advance directive? -  Pre-existing out of facility DNR order (yellow form or pink MOST form) -       Extended Emergency Contact Information Primary Emergency Contact: Fluke,Janice Address: [address]          [place] [address] [place] Phone: [phone] Relation: Daughter Secondary Emergency Contact: Fulton Reek Address: [address]          [place], [place] [address] [place] Phone: [phone] Relation: Relative   Allergies  Allergen Reactions  . Penicillins Shortness Of Breath, Diarrhea and Other (See Comments)    Stomach cramping  . Ciprofloxacin Other (See Comments)    myalgias  . Fexofenadine Other (See Comments)    unknown    Chief Complaint  Patient presents with  . New Admit To SNF    New Admisison Visit     HPI:  Patient is a 82 y.o. female seen today for short term rehabilitation post hospital admission from 03/09/2016-03/13/2016 with dyspnea. She was found to be in acute on chronic congestive heart failure. She also had a fall prior to admission and was complaining of left ankle pain on admission. She responded well to IV Lasix and was euvolemic at the time of discharge. She had x-ray of the left ankle that was negative for acute fracture. She was seen by physical therapy team and skilled nursing facility was recommended. She has medical history of atrial fibrillation, chronic combined systolic and diastolic heart failure,  COPD, status post pacemaker among others. She is seen in her room today.  Review of Systems:  Constitutional: Negative for fever, chills, diaphoresis.  HENT: Negative for headache, congestion, nasal discharge, sore throat, difficulty swallowing.  Positive for hearing loss. Eyes: Negative for blurred vision, double vision and discharge.  Respiratory: Negative for cough, shortness of breath and wheezing. She did not use oxygen at home. Cardiovascular: Negative for chest pain, palpitations, leg swelling.  Gastrointestinal: Negative for heartburn, nausea, vomiting, abdominal pain. Last bowel movement was yesterday.  Genitourinary: Negative for dysuria.  Musculoskeletal: Negative for back pain, fall in the facility.  Skin: Negative for itching, rash.  Neurological: Negative for dizziness. Psychiatric/Behavioral: Negative for depression.   Past Medical History:  Diagnosis Date  . Abdominal pain, unspecified site 08/17/2012  . Acute on chronic combined systolic and diastolic heart failure ([facility])   . Allergic state 06/22/2013  . Allergy   . Arthritis   . Atrial fibrillation with rapid ventricular response ([facility])   . COPD (chronic obstructive pulmonary disease) ([facility])   . Dyspnea   . Glaucoma 07/06/2014  . Hyperglycemia 02/01/2016  . Hypertension   . Hypokalemia 02/2016   Due to Lasix  . Increased urinary frequency 01/02/2014  . Left ankle sprain   . Loss of weight 01/19/2013  . Lung nodule    CT of chest 2007-left loewr lobar mass- evaluated by pulmonary- patient refused further work up  . Osteoporosis   . Other and unspecified hyperlipidemia 08/17/2012  . Pulmonary HTN   .  Syncope    fall  . Systolic CHF ([facility])    EF [id number] 09/2014  . Tobacco abuse 02/2016   Quit 2 years ago  . Vertigo    Past Surgical History:  Procedure Laterality Date  . ABDOMINAL HYSTERECTOMY    . APPENDECTOMY    . CESAREAN SECTION    . CHOLECYSTECTOMY    . EP IMPLANTABLE DEVICE N/A 09/26/2014   Procedure:  Pacemaker Implant;  Surgeon: Evans Lance, MD;  Location: [facility];  Service: Cardiovascular;  Laterality: N/A;  . HEMORRHOID SURGERY    . HERNIA REPAIR    . THORACENTESIS Right 01/07/15   Exudative Effusion Lymphocyte predominant   Social History:   reports that she has quit smoking. Her smoking use included Cigarettes. She has a 6.00 pack-year smoking history. She has never used smokeless tobacco. She reports that she does not drink alcohol or use drugs.  Family History  Problem Relation Age of Onset  . Heart disease Mother   . Hypertension Mother   . Diabetes Mother   . Stroke Mother   . Cancer Brother   . Diabetes    . Cancer      lung- in distant releatives  . Lupus Brother   . Cholelithiasis Father   . Nephrolithiasis Father     Medications: Allergies as of 03/15/2016      Reactions   Penicillins Shortness Of Breath, Diarrhea, Other (See Comments)   Stomach cramping   Ciprofloxacin Other (See Comments)   myalgias   Fexofenadine Other (See Comments)   unknown      Medication List       Accurate as of 03/15/16 11:59 AM. Always use your most recent med list.          aspirin 81 MG chewable tablet Chew 81 mg by mouth daily.   cetirizine 10 MG tablet Commonly known as:  ZYRTEC Take 10 mg by mouth every morning.   diltiazem 240 MG 24 hr capsule Commonly known as:  TIAZAC TAKE 1 CAPSULE (240 MG TOTAL) BY MOUTH DAILY.   fish oil-omega-3 fatty acids 1000 MG capsule Take 1 g by mouth daily at 12 noon.   flecainide 50 MG tablet Commonly known as:  TAMBOCOR TAKE 1 TABLET BY MOUTH EVERY 12 HOURS   furosemide 20 MG tablet Commonly known as:  LASIX TAKE 1 TABLET (20 MG TOTAL) BY MOUTH DAILY AS NEEDED FOR EDEMA.   metoprolol succinate 25 MG 24 hr tablet Commonly known as:  TOPROL-XL TAKE 1 TABLET BY MOUTH EVERY DAY   multivitamin tablet Take 1 tablet by mouth daily at 12 noon.   omeprazole 20 MG capsule Commonly known as:  PRILOSEC Take 20  mg by mouth daily.   potassium chloride 10 MEQ tablet Commonly known as:  K-DUR,KLOR-CON Take 1 tablet (10 mEq total) by mouth daily as needed (for when you take Lasix).   PROBIOTIC ADVANCED Caps Take 2 capsules by mouth daily. Florastor 250 mg       Immunizations: Immunization History  Administered Date(s) Administered  . Pneumococcal Conjugate-13 06/11/2015  . Pneumococcal Polysaccharide-23 05/19/2008  . Td 10/07/2003     Physical Exam: Vitals:   03/15/16 1154  BP: (!) 143/65  Pulse: 61  Resp: 18  Temp: (!) 96 F (35.6 C)  TempSrc: Oral  SpO2: 99%  Weight: 105 lb 12.8 oz (48 kg)  Height: 5\' 1"  (1.549 m)   Body mass index is 19.99 kg/m.  General- elderly female, frail and  thin built, in no acute distress Head- normocephalic, atraumatic Nose- no maxillary or frontal sinus tenderness, no nasal discharge Throat- moist mucus membrane  Eyes- PERRLA, EOMI, no pallor, no icterus, no discharge Neck- no cervical lymphadenopathy Cardiovascular- normal s1,s2, no murmur, no leg edema Respiratory- decreased air movement to the lung bases, no wheeze, no rhonchi, no crackles, no use of accessory muscles Abdomen- bowel sounds present, soft, non tender, no guarding or rigidity Musculoskeletal- able to move all 4 extremities, generalized weakness, kyphosis present Neurological- alert and oriented to person, place and time Skin- warm and dry, pacemaker lead to left upper chest wall Psychiatry- normal mood and affect    Labs reviewed: Basic Metabolic Panel:  Recent Labs  03/11/16 0213 03/12/16 0332 03/13/16 0215  NA 134* 138 134*  K 3.1* 3.7 3.7  CL 102 103 101  CO2 26 27 25   GLUCOSE 130* 94 87  BUN 9 11 14   CREATININE 0.69 0.70 0.67  CALCIUM 8.6* 8.9 8.9   Liver Function Tests:  Recent Labs  02/01/16 1234  AST 20  ALT 13  ALKPHOS 74  BILITOT 0.5  PROT 7.8  ALBUMIN 4.3   No results for input(s): LIPASE, AMYLASE in the last 8760 hours. No results for  input(s): AMMONIA in the last 8760 hours. CBC:  Recent Labs  02/01/16 1234 03/09/16 2043  WBC 8.2 9.2  HGB 13.2 11.9*  HCT 39.1 34.9*  MCV 88.5 88.6  PLT 302.0 260   Cardiac Enzymes: No results for input(s): CKTOTAL, CKMB, CKMBINDEX, TROPONINI in the last 8760 hours. BNP: Invalid input(s): POCBNP CBG: No results for input(s): GLUCAP in the last 8760 hours.  Radiological Exams: Dg Chest 2 View  Result Date: 03/09/2016 CLINICAL DATA:  Shortness of breath EXAM: CHEST  2 VIEW COMPARISON:  Chest radiograph 07/29/2015 FINDINGS: Multiple pulmonary nodules are again seen, primarily within the left mid lung. The lungs are hyperinflated with diffusely increased pulmonary markings suggestive of COPD. Left chest wall pacemaker leads are in radiographically appropriate position. No pleural effusion or pneumothorax. No focal airspace consolidation or pulmonary edema. IMPRESSION: 1. COPD and multiple pulmonary nodules, unchanged compared to 07/29/2015. The patient's most recent CT was 01/06/2015. A follow-up CT examination should be considered, as the appearance is concerning for metastatic disease. 2. No acute cardiopulmonary disease. Electronically Signed   By: Ulyses Jarred M.D.   On: 03/09/2016 22:02   Ct Chest W Contrast  Result Date: 03/10/2016 CLINICAL DATA:  82 y/o  F; multiple pulmonary nodules. EXAM: CT CHEST WITH CONTRAST TECHNIQUE: Multidetector CT imaging of the chest was performed during intravenous contrast administration. CONTRAST:  75 cc ISOVUE-300 IOPAMIDOL (ISOVUE-300) INJECTION 61% COMPARISON:  01/06/2015 CT chest.  03/09/2016 chest radiograph. FINDINGS: Cardiovascular: Normal caliber thoracic aorta. Extensive aortic atherosclerosis with both calcific and fibrofatty atherosclerosis. Normal caliber main pulmonary artery. Normal heart size. No pericardial effusion. Two lead pacemaker. Moderate coronary artery calcification. Mediastinum/Nodes: No enlarged mediastinal, hilar, or  axillary lymph nodes. Thyroid gland, trachea, and esophagus demonstrate no significant findings. Lungs/Pleura: Trace right pleural effusion decreased from prior CT. There are numerous pulmonary nodules throughout the lungs (> than 20) which demonstrate course calcification throughout all lobes although more pronounced in the upper lung zones. In comparison with the prior CT of the chest some may be slightly enlarged for example in the superior segment of the left lower lobe (series 7, image 34) where the nodule measures 18 x 15 mm, previously 14 x 15 mm, however, this may also be due  to differences in slice selection. Improved aeration within the long along the right lower mediastinum. Improved aeration of the right posterior lower lobe due to near complete resolution of pleural effusion. No definite new pulmonary nodule is identified. There is a background of centrilobular emphysema and septal thickening in the lung apices. Upper Abdomen: Liver hypoattenuation with geographic areas of increased density at the peripheries probably representing steatosis and areas of focal sparing. Fluid attenuating partially visualized lesion within the gallbladder fossa of uncertain significance. Musculoskeletal: Stable T11 compression deformity. Vertebral body heights are otherwise preserved. No acute osseous abnormality is identified. IMPRESSION: 1. Numerous calcified pulmonary nodules are stable to minimally increased in size in comparison with the prior CT of the chest. Small differences in measurements may be due to differences in slice selection. No definite new pulmonary nodule. No mediastinal adenopathy. Findings may represent sequelae of granulomatous disease such as tuberculosis, histoplasmosis, or sarcoidosis. There is a upper lung predominance which may also be seen with occupational exposure. Metastatic disease is less likely given relative stability. 2. Diminished right pleural effusion with trace residual and  improved aeration of the right lung. 3. Stable chronic T11 compression deformity. 4. Aortic and coronary artery atherosclerosis. 5. Partially visualized fluid attenuating structure within the gallbladder fossa. Given history of cholecystectomy this is of uncertain significance. Consider abdominal ultrasound for further characterization as clinically indicated. Electronically Signed   By: Kristine Garbe M.D.   On: 03/10/2016 00:03   Dg Foot Complete Left  Result Date: 03/09/2016 CLINICAL DATA:  Initial evaluation for acute trauma, fall. Edema about left foot EXAM: LEFT FOOT - COMPLETE 3+ VIEW COMPARISON:  None. FINDINGS: Severe osteopenia, somewhat limiting evaluation for possible subtle acute nondisplaced fractures. No acute fracture or dislocation. Joint spaces maintained. No appreciable soft tissue swelling identified. Mild vascular occasions noted posterior to the ankle. IMPRESSION: 1. No acute osseous abnormality about the left foot. 2. Severe osteopenia. Electronically Signed   By: Jeannine Boga M.D.   On: 03/09/2016 22:00    Assessment/Plan  Physical deconditioning With generalized weakness. Will have patient first with physical therapy and occupational therapy team to help regain strength and balance.  Chronic congestive heart failure Has combined congestive heart failure. She is status post IV diuretics in the hospital. Continue Lasix 20 mg daily as needed for edema. Monitor weights 3 days a week. Continue metoprolol succinate 25 mg daily. Breathing currently stable. Wean her off oxygen as tolerated.  Left ankle pain Stable at present. The have her work with physical therapy and occupational therapy on her balance. Patient is a fall risk.  Anemia of chronic disease Monitor cbc  Pulmonary hypertension Reviewed her echocardiogram. Continue her Lasix on a needed basis  Gastroesophageal reflux disease Continue Prilosec 20 mg daily  Hyponatremia Monitor  BMP  Hypertension Stable blood pressure reading on review. Monitor blood pressure reading in the facility. Continue her metoprolol.  Sinus bradycardia tachycardia syndrome She is status post pacemaker placement. Continue her metoprolol and diltiazem current regimen. Continue flecainide.  Lung nodule Follow-up as outpatient.  Oropharyngeal dysphagia Aspiration precautions. Speech therapist to follow.    Goals of care: short term rehabilitation   Labs/tests ordered: CBC, BMP in 1 week  Family/ staff Communication: reviewed care plan with patient and nursing supervisor    Blanchie Serve, MD Internal Medicine [place], [place] [address] Cell Phone (Monday-Friday 8 am - 5 pm): [phone] On Call: [phone] and follow prompts after 5  pm and on weekends Office Phone: [phone] Office Fax: [phone]

## 2016-03-15 NOTE — Telephone Encounter (Signed)
LMOVM reminding pt to send remote transmission.   

## 2016-03-15 NOTE — Progress Notes (Signed)
Opened in error

## 2016-03-16 ENCOUNTER — Encounter: Payer: Self-pay | Admitting: Cardiology

## 2016-03-16 NOTE — Progress Notes (Signed)
Remote pacemaker transmission.   

## 2016-03-18 LAB — CUP PACEART REMOTE DEVICE CHECK
Battery Impedance: 111 Ohm
Battery Voltage: 2.79 V
Brady Statistic AP VS Percent: 91 %
Implantable Lead Location: 753859
Implantable Lead Model: 5076
Implantable Lead Model: 5076
Lead Channel Pacing Threshold Pulse Width: 0.4 ms
Lead Channel Setting Pacing Amplitude: 2.5 V
Lead Channel Setting Pacing Pulse Width: 0.4 ms
MDC IDC LEAD IMPLANT DT: 20160701
MDC IDC LEAD IMPLANT DT: 20160701
MDC IDC LEAD LOCATION: 753860
MDC IDC MSMT BATTERY REMAINING LONGEVITY: 147 mo
MDC IDC MSMT LEADCHNL RA IMPEDANCE VALUE: 460 Ohm
MDC IDC MSMT LEADCHNL RA PACING THRESHOLD AMPLITUDE: 0.5 V
MDC IDC MSMT LEADCHNL RV IMPEDANCE VALUE: 498 Ohm
MDC IDC MSMT LEADCHNL RV PACING THRESHOLD AMPLITUDE: 0.625 V
MDC IDC MSMT LEADCHNL RV PACING THRESHOLD PULSEWIDTH: 0.4 ms
MDC IDC PG IMPLANT DT: 20160701
MDC IDC SESS DTM: 20171219221915
MDC IDC SET LEADCHNL RA PACING AMPLITUDE: 1.5 V
MDC IDC SET LEADCHNL RV SENSING SENSITIVITY: 4 mV
MDC IDC STAT BRADY AP VP PERCENT: 0 %
MDC IDC STAT BRADY AS VP PERCENT: 0 %
MDC IDC STAT BRADY AS VS PERCENT: 9 %

## 2016-03-21 LAB — BASIC METABOLIC PANEL
BUN: 9 mg/dL (ref 4–21)
CREATININE: 0.6 mg/dL (ref 0.5–1.1)
GLUCOSE: 109 mg/dL
POTASSIUM: 4.1 mmol/L (ref 3.4–5.3)
SODIUM: 139 mmol/L (ref 137–147)

## 2016-03-21 LAB — CBC AND DIFFERENTIAL
HCT: 39 % (ref 36–46)
HEMOGLOBIN: 12.6 g/dL (ref 12.0–16.0)
Platelets: 356 10*3/uL (ref 150–399)
WBC: 7.1 10^3/mL

## 2016-04-01 ENCOUNTER — Encounter: Payer: Self-pay | Admitting: Cardiology

## 2016-04-04 ENCOUNTER — Non-Acute Institutional Stay (SKILLED_NURSING_FACILITY): Payer: Medicare Other | Admitting: Adult Health

## 2016-04-04 ENCOUNTER — Encounter: Payer: Self-pay | Admitting: Adult Health

## 2016-04-04 DIAGNOSIS — S93402S Sprain of unspecified ligament of left ankle, sequela: Secondary | ICD-10-CM | POA: Diagnosis not present

## 2016-04-04 DIAGNOSIS — I4891 Unspecified atrial fibrillation: Secondary | ICD-10-CM | POA: Diagnosis not present

## 2016-04-04 DIAGNOSIS — S93492A Sprain of other ligament of left ankle, initial encounter: Secondary | ICD-10-CM | POA: Diagnosis not present

## 2016-04-04 DIAGNOSIS — J3089 Other allergic rhinitis: Secondary | ICD-10-CM | POA: Diagnosis not present

## 2016-04-04 DIAGNOSIS — I10 Essential (primary) hypertension: Secondary | ICD-10-CM | POA: Diagnosis not present

## 2016-04-04 DIAGNOSIS — K219 Gastro-esophageal reflux disease without esophagitis: Secondary | ICD-10-CM

## 2016-04-04 DIAGNOSIS — I5042 Chronic combined systolic (congestive) and diastolic (congestive) heart failure: Secondary | ICD-10-CM

## 2016-04-04 DIAGNOSIS — R5381 Other malaise: Secondary | ICD-10-CM | POA: Diagnosis not present

## 2016-04-04 DIAGNOSIS — J309 Allergic rhinitis, unspecified: Secondary | ICD-10-CM

## 2016-04-04 NOTE — Progress Notes (Signed)
DATE:  04/04/2016   MRN:  [id number]  BIRTHDAY: Oct 22, 1923  Facility:  [facility] Location:  [place]:  SNF (31)  Contact Information    Name Relation Home Work Mobile   [place] Daughter [phone]     [place] Reek Relative [phone]         Code Status History    Date Active Date Inactive Code Status Order ID Comments User Context   03/10/2016  1:15 AM 03/13/2016  3:52 PM DNR [id number]  Etta Quill, DO ED   01/06/2015  1:37 AM 01/09/2015 10:10 PM Partial Code [id number]  Rise Patience, MD Inpatient   11/17/2014  6:17 AM 11/18/2014  8:29 PM DNR [id number]  Theressa Millard, MD ED   11/17/2014  6:08 AM 11/17/2014  6:17 AM Full Code [id number]  Theressa Millard, MD ED   09/26/2014  8:02 PM 09/30/2014  5:31 PM DNR [id number]  Ritta Slot, NP Inpatient   09/26/2014  5:23 PM 09/26/2014  8:02 PM Full Code [id number]  Evans Lance, MD Inpatient   09/25/2014  5:23 PM 09/26/2014  5:23 PM DNR [id number]  Samella Parr, NP ED   07/13/2013  7:52 PM 09/25/2014  5:23 PM Full Code [id number]  Hennie Duos, MD Outpatient   06/28/2013  9:36 PM 07/02/2013  6:43 PM Full Code [id number]  Etta Quill, DO Inpatient    Questions for Most Recent Historical Code Status (Order [id number])    Question Answer Comment   In the event of cardiac or respiratory ARREST Do not call a "code blue"    In the event of cardiac or respiratory ARREST Do not perform Intubation, CPR, defibrillation or ACLS    In the event of cardiac or respiratory ARREST Use medication by any route, position, wound care, and other measures to relive pain and suffering. May use oxygen, suction and manual treatment of airway obstruction as needed for comfort.    Comments Confirmed with patient and daughter         Advance Directive Documentation   Flowsheet Row Most Recent Value  Type of Advance Directive  Out of facility DNR (pink MOST or yellow form)  Pre-existing out of facility DNR  order (yellow form or pink MOST form)  No data  "MOST" Form in Place?  No data       Chief Complaint  Patient presents with  . Discharge Note    HISTORY OF PRESENT ILLNESS:  This is a 83-YO female who is for discharge home with Home health PT, OT, CNA and Nursing services.   She has been admitted to [facility] on 03/13/16 from [facility] admission dates 03/09/16 thru 03/13/16 for complaints of SOB and fall with minimal ambulation subsequent to fall.  CXR showed trace right pleural effusion and unchanged lung nodules from October 2016.  Ankle x-ray was negative. She was diuresed with IV Lasix.    Patient was admitted to this facility for short-term rehabilitation after the patient's recent hospitalization.  Patient has completed SNF rehabilitation and therapy has cleared the patient for discharge.   PAST MEDICAL HISTORY:  Past Medical History:  Diagnosis Date  . Abdominal pain, unspecified site 08/17/2012  . Acute on chronic combined systolic and diastolic heart failure ([place])   . Allergic state 06/22/2013  . Allergy   . Arthritis   . Atrial fibrillation with rapid ventricular response ([place])   . COPD (chronic  obstructive pulmonary disease) ([place])   . Dyspnea   . Glaucoma 07/06/2014  . Hyperglycemia 02/01/2016  . Hypertension   . Hypokalemia 02/2016   Due to Lasix  . Increased urinary frequency 01/02/2014  . Left ankle sprain   . Loss of weight 01/19/2013  . Lung nodule    CT of chest 2007-left loewr lobar mass- evaluated by pulmonary- patient refused further work up  . Osteoporosis   . Other and unspecified hyperlipidemia 08/17/2012  . Pulmonary HTN   . Syncope    fall  . Systolic CHF ([place])    EF [id number] 09/2014  . Tobacco abuse 02/2016   Quit 2 years ago  . Vertigo      CURRENT MEDICATIONS: Reviewed  Patient's Medications  New Prescriptions   No medications on file  Previous Medications   ASPIRIN 81 MG CHEWABLE TABLET    Chew 81 mg by mouth daily.     CETIRIZINE (ZYRTEC) 10 MG TABLET    Take 10 mg by mouth every morning.   DILTIAZEM (TIAZAC) 240 MG 24 HR CAPSULE    TAKE 1 CAPSULE (240 MG TOTAL) BY MOUTH DAILY.   FISH OIL-OMEGA-3 FATTY ACIDS 1000 MG CAPSULE    Take 1 g by mouth daily at 12 noon.    FLECAINIDE (TAMBOCOR) 50 MG TABLET    TAKE 1 TABLET BY MOUTH EVERY 12 HOURS   FUROSEMIDE (LASIX) 20 MG TABLET    TAKE 1 TABLET (20 MG TOTAL) BY MOUTH DAILY AS NEEDED FOR EDEMA.   METOPROLOL SUCCINATE (TOPROL-XL) 25 MG 24 HR TABLET    TAKE 1 TABLET BY MOUTH EVERY DAY   MULTIPLE VITAMIN (MULTIVITAMIN) TABLET    Take 1 tablet by mouth daily at 12 noon.    NUTRITIONAL SUPPLEMENT LIQD    Take 120 mLs by mouth daily. [employer]   OMEPRAZOLE (PRILOSEC) 20 MG CAPSULE    Take 20 mg by mouth daily.    POTASSIUM CHLORIDE (K-DUR,KLOR-CON) 10 MEQ TABLET    Take 1 tablet (10 mEq total) by mouth daily as needed (for when you take Lasix).   PROBIOTIC PRODUCT (PROBIOTIC ADVANCED) CAPS    Take 2 capsules by mouth daily. Florastor 250 mg  Modified Medications   No medications on file  Discontinued Medications   No medications on file     Allergies  Allergen Reactions  . Penicillins Shortness Of Breath, Diarrhea and Other (See Comments)    Stomach cramping  . Ciprofloxacin Other (See Comments)    myalgias  . Fexofenadine Other (See Comments)    unknown     REVIEW OF SYSTEMS:  GENERAL: no change in appetite, no fatigue, no weight changes, no fever, chills or weakness EYES: Denies change in vision, dry eyes, eye pain, itching or discharge EARS: Denies ringing in ears, or earache NOSE: Denies nasal congestion or epistaxis MOUTH and THROAT: Denies oral discomfort, gingival pain or bleeding, pain from teeth or hoarseness   RESPIRATORY: no cough, SOB, DOE, wheezing, hemoptysis CARDIAC: no chest pain, edema or palpitations GI: no abdominal pain, diarrhea, constipation, heart burn, nausea or vomiting GU: Denies dysuria, frequency, hematuria, incontinence, or  discharge PSYCHIATRIC: Denies feeling of depression or anxiety. No report of hallucinations, insomnia, paranoia, or agitation   PHYSICAL EXAMINATION  GENERAL APPEARANCE: Well nourished. In no acute distress.  SKIN:  Skin is warm and dry.  HEAD: Normal in size and contour. No evidence of trauma EYES: Lids open and close normally. No blepharitis, entropion or ectropion. PERRL. Conjunctivae are clear  and sclerae are white. Lenses are without opacity EARS: Pinnae are normal. Hard of hearing even with bilateral hearing aids MOUTH and THROAT: Lips are without lesions. Oral mucosa is moist and without lesions. Tongue is normal in shape, size, and color and without lesions NECK: supple, trachea midline, no neck masses, no thyroid tenderness, no thyromegaly LYMPHATICS: no LAN in the neck, no supraclavicular LAN RESPIRATORY: breathing is even & unlabored, BS CTAB CARDIAC: RRR, no murmur,no extra heart sounds, no edema, left chest pacemaker GI: abdomen soft, normal BS, no masses, no tenderness, no hepatomegaly, no splenomegaly EXTREMITIES:  Able to move X 4 extremities, left CAM boot PSYCHIATRIC: Alert to self and time, disoriented to place. Affect and behavior are appropriate   LABS/RADIOLOGY: Labs reviewed: Basic Metabolic Panel:  Recent Labs  03/11/16 0213 03/12/16 0332 03/13/16 0215 03/21/16  NA 134* 138 134* 139  K 3.1* 3.7 3.7 4.1  CL 102 103 101  --   CO2 26 27 25   --   GLUCOSE 130* 94 87  --   BUN 9 11 14 9   CREATININE 0.69 0.70 0.67 0.6  CALCIUM 8.6* 8.9 8.9  --    Liver Function Tests:  Recent Labs  02/01/16 1234  AST 20  ALT 13  ALKPHOS 74  BILITOT 0.5  PROT 7.8  ALBUMIN 4.3   CBC:  Recent Labs  02/01/16 1234 03/09/16 2043 03/21/16  WBC 8.2 9.2 7.1  HGB 13.2 11.9* 12.6  HCT 39.1 34.9* 39  MCV 88.5 88.6  --   PLT 302.0 260 356   Lipid Panel:  Recent Labs  02/01/16 1234  HDL 76.80    Dg Chest 2 View  Result Date: 03/09/2016 CLINICAL DATA:   Shortness of breath EXAM: CHEST  2 VIEW COMPARISON:  Chest radiograph 07/29/2015 FINDINGS: Multiple pulmonary nodules are again seen, primarily within the left mid lung. The lungs are hyperinflated with diffusely increased pulmonary markings suggestive of COPD. Left chest wall pacemaker leads are in radiographically appropriate position. No pleural effusion or pneumothorax. No focal airspace consolidation or pulmonary edema. IMPRESSION: 1. COPD and multiple pulmonary nodules, unchanged compared to 07/29/2015. The patient's most recent CT was 01/06/2015. A follow-up CT examination should be considered, as the appearance is concerning for metastatic disease. 2. No acute cardiopulmonary disease. Electronically Signed   By: Ulyses Jarred M.D.   On: 03/09/2016 22:02   Ct Chest W Contrast  Result Date: 03/10/2016 CLINICAL DATA:  83 y/o  F; multiple pulmonary nodules. EXAM: CT CHEST WITH CONTRAST TECHNIQUE: Multidetector CT imaging of the chest was performed during intravenous contrast administration. CONTRAST:  75 cc ISOVUE-300 IOPAMIDOL (ISOVUE-300) INJECTION 61% COMPARISON:  01/06/2015 CT chest.  03/09/2016 chest radiograph. FINDINGS: Cardiovascular: Normal caliber thoracic aorta. Extensive aortic atherosclerosis with both calcific and fibrofatty atherosclerosis. Normal caliber main pulmonary artery. Normal heart size. No pericardial effusion. Two lead pacemaker. Moderate coronary artery calcification. Mediastinum/Nodes: No enlarged mediastinal, hilar, or axillary lymph nodes. Thyroid gland, trachea, and esophagus demonstrate no significant findings. Lungs/Pleura: Trace right pleural effusion decreased from prior CT. There are numerous pulmonary nodules throughout the lungs (> than 20) which demonstrate course calcification throughout all lobes although more pronounced in the upper lung zones. In comparison with the prior CT of the chest some may be slightly enlarged for example in the superior segment of the left  lower lobe (series 7, image 34) where the nodule measures 18 x 15 mm, previously 14 x 15 mm, however, this may also be due to differences  in slice selection. Improved aeration within the long along the right lower mediastinum. Improved aeration of the right posterior lower lobe due to near complete resolution of pleural effusion. No definite new pulmonary nodule is identified. There is a background of centrilobular emphysema and septal thickening in the lung apices. Upper Abdomen: Liver hypoattenuation with geographic areas of increased density at the peripheries probably representing steatosis and areas of focal sparing. Fluid attenuating partially visualized lesion within the gallbladder fossa of uncertain significance. Musculoskeletal: Stable T11 compression deformity. Vertebral body heights are otherwise preserved. No acute osseous abnormality is identified. IMPRESSION: 1. Numerous calcified pulmonary nodules are stable to minimally increased in size in comparison with the prior CT of the chest. Small differences in measurements may be due to differences in slice selection. No definite new pulmonary nodule. No mediastinal adenopathy. Findings may represent sequelae of granulomatous disease such as tuberculosis, histoplasmosis, or sarcoidosis. There is a upper lung predominance which may also be seen with occupational exposure. Metastatic disease is less likely given relative stability. 2. Diminished right pleural effusion with trace residual and improved aeration of the right lung. 3. Stable chronic T11 compression deformity. 4. Aortic and coronary artery atherosclerosis. 5. Partially visualized fluid attenuating structure within the gallbladder fossa. Given history of cholecystectomy this is of uncertain significance. Consider abdominal ultrasound for further characterization as clinically indicated. Electronically Signed   By: Kristine Garbe M.D.   On: 03/10/2016 00:03   Dg Foot Complete  Left  Result Date: 03/09/2016 CLINICAL DATA:  Initial evaluation for acute trauma, fall. Edema about left foot EXAM: LEFT FOOT - COMPLETE 3+ VIEW COMPARISON:  None. FINDINGS: Severe osteopenia, somewhat limiting evaluation for possible subtle acute nondisplaced fractures. No acute fracture or dislocation. Joint spaces maintained. No appreciable soft tissue swelling identified. Mild vascular occasions noted posterior to the ankle. IMPRESSION: 1. No acute osseous abnormality about the left foot. 2. Severe osteopenia. Electronically Signed   By: Jeannine Boga M.D.   On: 03/09/2016 22:00    ASSESSMENT/PLAN:  1. Physical deconditioning - for home health PT and OT, for therapeutic strengthening exercises; fall precautions   2. Sprain of left ankle, unspecified ligament, sequela - for home health PT and OT, for therapeutic strengthening exercises;  cam boot on the foot was discontinued, WBAT LLE, follow-up with orthopedics   3. Chronic combined systolic and diastolic congestive heart failure (HCC) - no SOB; continue Lasix when necessary and KCl ER when necessary   4. Atrial fibrillation, unspecified type (HCC) - rate controlled; continue aspirin 81 mg 1 tab by mouth daily, diltiazem 24-hour ER 240 mg 1 capsule by mouth daily, flecainide 50 mg 1 tab by mouth every 12 hours and metoprolol succinate ER 25 mg 1 tab by mouth daily   5. Allergic rhinitis, unspecified seasonality, unspecified trigger - continue Zyrtec 10 mg 1 tab by mouth daily   6. Essential hypertension - well controlled; continue diltiazem 24-hour ER 240 mg 1 capsule by mouth daily and metoprolol succinate ER 25 mg 1 tab by mouth daily   7. Gastroesophageal reflux disease without esophagitis - stable; continue Prilosec 20 mg 1 tab by mouth daily     I have filled out patient's discharge paperwork and written prescriptions.  Patient will receive home health PT, OT, Nursing and CNA.  DME provided:  None  Total discharge  time: Greater than 30 minutes Greater than 50% was spent in counseling and coordination of care.  Discharge time involved coordination of the discharge process with social  worker, nursing staff and therapy department. Medical justification for home health services verified.   Bernece Gall C. [place] - NP    [employer] [phone]

## 2016-04-07 ENCOUNTER — Telehealth: Payer: Self-pay | Admitting: Family Medicine

## 2016-04-07 DIAGNOSIS — I5043 Acute on chronic combined systolic (congestive) and diastolic (congestive) heart failure: Secondary | ICD-10-CM | POA: Diagnosis not present

## 2016-04-07 DIAGNOSIS — I272 Pulmonary hypertension, unspecified: Secondary | ICD-10-CM | POA: Diagnosis not present

## 2016-04-07 DIAGNOSIS — I110 Hypertensive heart disease with heart failure: Secondary | ICD-10-CM | POA: Diagnosis not present

## 2016-04-07 DIAGNOSIS — S93402D Sprain of unspecified ligament of left ankle, subsequent encounter: Secondary | ICD-10-CM | POA: Diagnosis not present

## 2016-04-07 DIAGNOSIS — M1990 Unspecified osteoarthritis, unspecified site: Secondary | ICD-10-CM | POA: Diagnosis not present

## 2016-04-07 DIAGNOSIS — J449 Chronic obstructive pulmonary disease, unspecified: Secondary | ICD-10-CM | POA: Diagnosis not present

## 2016-04-07 NOTE — Telephone Encounter (Signed)
Caller name: Nicki Reaper Relationship to patient: [place] Can be reached: [phone] Pharmacy:  Reason for call: [facility] request verbal orders to see patient 1 time this week, and then 2 times a week for 4 weeks, and 1 time a week for 5 weeks for a total of 10 weeks. Will focus on Medication Management and disease process training.

## 2016-04-08 DIAGNOSIS — I272 Pulmonary hypertension, unspecified: Secondary | ICD-10-CM | POA: Diagnosis not present

## 2016-04-08 DIAGNOSIS — I5043 Acute on chronic combined systolic (congestive) and diastolic (congestive) heart failure: Secondary | ICD-10-CM | POA: Diagnosis not present

## 2016-04-08 DIAGNOSIS — S93402D Sprain of unspecified ligament of left ankle, subsequent encounter: Secondary | ICD-10-CM | POA: Diagnosis not present

## 2016-04-08 DIAGNOSIS — J449 Chronic obstructive pulmonary disease, unspecified: Secondary | ICD-10-CM | POA: Diagnosis not present

## 2016-04-08 DIAGNOSIS — I110 Hypertensive heart disease with heart failure: Secondary | ICD-10-CM | POA: Diagnosis not present

## 2016-04-08 DIAGNOSIS — M1990 Unspecified osteoarthritis, unspecified site: Secondary | ICD-10-CM | POA: Diagnosis not present

## 2016-04-11 ENCOUNTER — Telehealth: Payer: Self-pay | Admitting: Family Medicine

## 2016-04-11 DIAGNOSIS — J449 Chronic obstructive pulmonary disease, unspecified: Secondary | ICD-10-CM | POA: Diagnosis not present

## 2016-04-11 DIAGNOSIS — S93402D Sprain of unspecified ligament of left ankle, subsequent encounter: Secondary | ICD-10-CM | POA: Diagnosis not present

## 2016-04-11 DIAGNOSIS — I110 Hypertensive heart disease with heart failure: Secondary | ICD-10-CM | POA: Diagnosis not present

## 2016-04-11 DIAGNOSIS — I272 Pulmonary hypertension, unspecified: Secondary | ICD-10-CM | POA: Diagnosis not present

## 2016-04-11 DIAGNOSIS — M1990 Unspecified osteoarthritis, unspecified site: Secondary | ICD-10-CM | POA: Diagnosis not present

## 2016-04-11 DIAGNOSIS — I5043 Acute on chronic combined systolic (congestive) and diastolic (congestive) heart failure: Secondary | ICD-10-CM | POA: Diagnosis not present

## 2016-04-11 NOTE — Telephone Encounter (Signed)
Caller name: Randall Hiss  Relation to pt: PT from [facility] back number: [phone]    Reason for call:  Requesting verbal orders for PT 2x 6 weeks

## 2016-04-11 NOTE — Telephone Encounter (Signed)
OK to give VO as requested 

## 2016-04-11 NOTE — Telephone Encounter (Signed)
Ok to give VO for PT as requested

## 2016-04-12 DIAGNOSIS — S93402D Sprain of unspecified ligament of left ankle, subsequent encounter: Secondary | ICD-10-CM | POA: Diagnosis not present

## 2016-04-12 DIAGNOSIS — J449 Chronic obstructive pulmonary disease, unspecified: Secondary | ICD-10-CM | POA: Diagnosis not present

## 2016-04-12 DIAGNOSIS — M1990 Unspecified osteoarthritis, unspecified site: Secondary | ICD-10-CM | POA: Diagnosis not present

## 2016-04-12 DIAGNOSIS — I272 Pulmonary hypertension, unspecified: Secondary | ICD-10-CM | POA: Diagnosis not present

## 2016-04-12 DIAGNOSIS — I110 Hypertensive heart disease with heart failure: Secondary | ICD-10-CM | POA: Diagnosis not present

## 2016-04-12 DIAGNOSIS — I5043 Acute on chronic combined systolic (congestive) and diastolic (congestive) heart failure: Secondary | ICD-10-CM | POA: Diagnosis not present

## 2016-04-12 NOTE — Telephone Encounter (Signed)
Called [facility] left detailed message of PCP ok. 

## 2016-04-12 NOTE — Telephone Encounter (Signed)
Called left detailed message of verbal PCP ok 

## 2016-04-14 DIAGNOSIS — I272 Pulmonary hypertension, unspecified: Secondary | ICD-10-CM | POA: Diagnosis not present

## 2016-04-14 DIAGNOSIS — I5043 Acute on chronic combined systolic (congestive) and diastolic (congestive) heart failure: Secondary | ICD-10-CM | POA: Diagnosis not present

## 2016-04-14 DIAGNOSIS — M1990 Unspecified osteoarthritis, unspecified site: Secondary | ICD-10-CM | POA: Diagnosis not present

## 2016-04-14 DIAGNOSIS — J449 Chronic obstructive pulmonary disease, unspecified: Secondary | ICD-10-CM | POA: Diagnosis not present

## 2016-04-14 DIAGNOSIS — I110 Hypertensive heart disease with heart failure: Secondary | ICD-10-CM | POA: Diagnosis not present

## 2016-04-14 DIAGNOSIS — S93402D Sprain of unspecified ligament of left ankle, subsequent encounter: Secondary | ICD-10-CM | POA: Diagnosis not present

## 2016-04-15 DIAGNOSIS — M1990 Unspecified osteoarthritis, unspecified site: Secondary | ICD-10-CM | POA: Diagnosis not present

## 2016-04-15 DIAGNOSIS — J449 Chronic obstructive pulmonary disease, unspecified: Secondary | ICD-10-CM | POA: Diagnosis not present

## 2016-04-15 DIAGNOSIS — S93402D Sprain of unspecified ligament of left ankle, subsequent encounter: Secondary | ICD-10-CM | POA: Diagnosis not present

## 2016-04-15 DIAGNOSIS — I272 Pulmonary hypertension, unspecified: Secondary | ICD-10-CM | POA: Diagnosis not present

## 2016-04-15 DIAGNOSIS — I110 Hypertensive heart disease with heart failure: Secondary | ICD-10-CM | POA: Diagnosis not present

## 2016-04-15 DIAGNOSIS — I5043 Acute on chronic combined systolic (congestive) and diastolic (congestive) heart failure: Secondary | ICD-10-CM | POA: Diagnosis not present

## 2016-04-19 DIAGNOSIS — I5043 Acute on chronic combined systolic (congestive) and diastolic (congestive) heart failure: Secondary | ICD-10-CM | POA: Diagnosis not present

## 2016-04-19 DIAGNOSIS — I272 Pulmonary hypertension, unspecified: Secondary | ICD-10-CM | POA: Diagnosis not present

## 2016-04-19 DIAGNOSIS — M1990 Unspecified osteoarthritis, unspecified site: Secondary | ICD-10-CM | POA: Diagnosis not present

## 2016-04-19 DIAGNOSIS — S93402D Sprain of unspecified ligament of left ankle, subsequent encounter: Secondary | ICD-10-CM | POA: Diagnosis not present

## 2016-04-19 DIAGNOSIS — I110 Hypertensive heart disease with heart failure: Secondary | ICD-10-CM | POA: Diagnosis not present

## 2016-04-19 DIAGNOSIS — J449 Chronic obstructive pulmonary disease, unspecified: Secondary | ICD-10-CM | POA: Diagnosis not present

## 2016-04-21 DIAGNOSIS — I110 Hypertensive heart disease with heart failure: Secondary | ICD-10-CM | POA: Diagnosis not present

## 2016-04-21 DIAGNOSIS — M1990 Unspecified osteoarthritis, unspecified site: Secondary | ICD-10-CM | POA: Diagnosis not present

## 2016-04-21 DIAGNOSIS — S93402D Sprain of unspecified ligament of left ankle, subsequent encounter: Secondary | ICD-10-CM | POA: Diagnosis not present

## 2016-04-21 DIAGNOSIS — I5043 Acute on chronic combined systolic (congestive) and diastolic (congestive) heart failure: Secondary | ICD-10-CM | POA: Diagnosis not present

## 2016-04-21 DIAGNOSIS — I272 Pulmonary hypertension, unspecified: Secondary | ICD-10-CM | POA: Diagnosis not present

## 2016-04-21 DIAGNOSIS — J449 Chronic obstructive pulmonary disease, unspecified: Secondary | ICD-10-CM | POA: Diagnosis not present

## 2016-04-25 DIAGNOSIS — I5043 Acute on chronic combined systolic (congestive) and diastolic (congestive) heart failure: Secondary | ICD-10-CM | POA: Diagnosis not present

## 2016-04-25 DIAGNOSIS — I272 Pulmonary hypertension, unspecified: Secondary | ICD-10-CM | POA: Diagnosis not present

## 2016-04-25 DIAGNOSIS — I110 Hypertensive heart disease with heart failure: Secondary | ICD-10-CM | POA: Diagnosis not present

## 2016-04-25 DIAGNOSIS — S93402D Sprain of unspecified ligament of left ankle, subsequent encounter: Secondary | ICD-10-CM | POA: Diagnosis not present

## 2016-04-25 DIAGNOSIS — J449 Chronic obstructive pulmonary disease, unspecified: Secondary | ICD-10-CM | POA: Diagnosis not present

## 2016-04-25 DIAGNOSIS — M1990 Unspecified osteoarthritis, unspecified site: Secondary | ICD-10-CM | POA: Diagnosis not present

## 2016-04-26 DIAGNOSIS — M1990 Unspecified osteoarthritis, unspecified site: Secondary | ICD-10-CM | POA: Diagnosis not present

## 2016-04-26 DIAGNOSIS — J449 Chronic obstructive pulmonary disease, unspecified: Secondary | ICD-10-CM | POA: Diagnosis not present

## 2016-04-26 DIAGNOSIS — S93402D Sprain of unspecified ligament of left ankle, subsequent encounter: Secondary | ICD-10-CM | POA: Diagnosis not present

## 2016-04-26 DIAGNOSIS — I110 Hypertensive heart disease with heart failure: Secondary | ICD-10-CM | POA: Diagnosis not present

## 2016-04-26 DIAGNOSIS — I272 Pulmonary hypertension, unspecified: Secondary | ICD-10-CM | POA: Diagnosis not present

## 2016-04-26 DIAGNOSIS — I5043 Acute on chronic combined systolic (congestive) and diastolic (congestive) heart failure: Secondary | ICD-10-CM | POA: Diagnosis not present

## 2016-04-27 DIAGNOSIS — I272 Pulmonary hypertension, unspecified: Secondary | ICD-10-CM | POA: Diagnosis not present

## 2016-04-27 DIAGNOSIS — S93402D Sprain of unspecified ligament of left ankle, subsequent encounter: Secondary | ICD-10-CM | POA: Diagnosis not present

## 2016-04-27 DIAGNOSIS — I110 Hypertensive heart disease with heart failure: Secondary | ICD-10-CM | POA: Diagnosis not present

## 2016-04-27 DIAGNOSIS — J449 Chronic obstructive pulmonary disease, unspecified: Secondary | ICD-10-CM | POA: Diagnosis not present

## 2016-04-27 DIAGNOSIS — M1990 Unspecified osteoarthritis, unspecified site: Secondary | ICD-10-CM | POA: Diagnosis not present

## 2016-04-27 DIAGNOSIS — I5043 Acute on chronic combined systolic (congestive) and diastolic (congestive) heart failure: Secondary | ICD-10-CM | POA: Diagnosis not present

## 2016-04-29 DIAGNOSIS — I5043 Acute on chronic combined systolic (congestive) and diastolic (congestive) heart failure: Secondary | ICD-10-CM | POA: Diagnosis not present

## 2016-04-29 DIAGNOSIS — M1990 Unspecified osteoarthritis, unspecified site: Secondary | ICD-10-CM | POA: Diagnosis not present

## 2016-04-29 DIAGNOSIS — J449 Chronic obstructive pulmonary disease, unspecified: Secondary | ICD-10-CM | POA: Diagnosis not present

## 2016-04-29 DIAGNOSIS — I272 Pulmonary hypertension, unspecified: Secondary | ICD-10-CM | POA: Diagnosis not present

## 2016-04-29 DIAGNOSIS — I110 Hypertensive heart disease with heart failure: Secondary | ICD-10-CM | POA: Diagnosis not present

## 2016-04-29 DIAGNOSIS — S93402D Sprain of unspecified ligament of left ankle, subsequent encounter: Secondary | ICD-10-CM | POA: Diagnosis not present

## 2016-05-02 DIAGNOSIS — J449 Chronic obstructive pulmonary disease, unspecified: Secondary | ICD-10-CM | POA: Diagnosis not present

## 2016-05-02 DIAGNOSIS — I5043 Acute on chronic combined systolic (congestive) and diastolic (congestive) heart failure: Secondary | ICD-10-CM | POA: Diagnosis not present

## 2016-05-02 DIAGNOSIS — M1990 Unspecified osteoarthritis, unspecified site: Secondary | ICD-10-CM | POA: Diagnosis not present

## 2016-05-02 DIAGNOSIS — I110 Hypertensive heart disease with heart failure: Secondary | ICD-10-CM | POA: Diagnosis not present

## 2016-05-02 DIAGNOSIS — S93402D Sprain of unspecified ligament of left ankle, subsequent encounter: Secondary | ICD-10-CM | POA: Diagnosis not present

## 2016-05-02 DIAGNOSIS — I272 Pulmonary hypertension, unspecified: Secondary | ICD-10-CM | POA: Diagnosis not present

## 2016-05-03 DIAGNOSIS — S93402D Sprain of unspecified ligament of left ankle, subsequent encounter: Secondary | ICD-10-CM | POA: Diagnosis not present

## 2016-05-03 DIAGNOSIS — I272 Pulmonary hypertension, unspecified: Secondary | ICD-10-CM | POA: Diagnosis not present

## 2016-05-03 DIAGNOSIS — I5043 Acute on chronic combined systolic (congestive) and diastolic (congestive) heart failure: Secondary | ICD-10-CM | POA: Diagnosis not present

## 2016-05-03 DIAGNOSIS — J449 Chronic obstructive pulmonary disease, unspecified: Secondary | ICD-10-CM | POA: Diagnosis not present

## 2016-05-03 DIAGNOSIS — M1990 Unspecified osteoarthritis, unspecified site: Secondary | ICD-10-CM | POA: Diagnosis not present

## 2016-05-03 DIAGNOSIS — I110 Hypertensive heart disease with heart failure: Secondary | ICD-10-CM | POA: Diagnosis not present

## 2016-05-04 DIAGNOSIS — S93402D Sprain of unspecified ligament of left ankle, subsequent encounter: Secondary | ICD-10-CM | POA: Diagnosis not present

## 2016-05-04 DIAGNOSIS — J449 Chronic obstructive pulmonary disease, unspecified: Secondary | ICD-10-CM | POA: Diagnosis not present

## 2016-05-04 DIAGNOSIS — M1990 Unspecified osteoarthritis, unspecified site: Secondary | ICD-10-CM | POA: Diagnosis not present

## 2016-05-04 DIAGNOSIS — I5043 Acute on chronic combined systolic (congestive) and diastolic (congestive) heart failure: Secondary | ICD-10-CM | POA: Diagnosis not present

## 2016-05-04 DIAGNOSIS — I110 Hypertensive heart disease with heart failure: Secondary | ICD-10-CM | POA: Diagnosis not present

## 2016-05-04 DIAGNOSIS — I272 Pulmonary hypertension, unspecified: Secondary | ICD-10-CM | POA: Diagnosis not present

## 2016-05-05 DIAGNOSIS — J449 Chronic obstructive pulmonary disease, unspecified: Secondary | ICD-10-CM | POA: Diagnosis not present

## 2016-05-05 DIAGNOSIS — I5043 Acute on chronic combined systolic (congestive) and diastolic (congestive) heart failure: Secondary | ICD-10-CM | POA: Diagnosis not present

## 2016-05-05 DIAGNOSIS — M1990 Unspecified osteoarthritis, unspecified site: Secondary | ICD-10-CM | POA: Diagnosis not present

## 2016-05-05 DIAGNOSIS — I272 Pulmonary hypertension, unspecified: Secondary | ICD-10-CM | POA: Diagnosis not present

## 2016-05-05 DIAGNOSIS — S93402D Sprain of unspecified ligament of left ankle, subsequent encounter: Secondary | ICD-10-CM | POA: Diagnosis not present

## 2016-05-05 DIAGNOSIS — I110 Hypertensive heart disease with heart failure: Secondary | ICD-10-CM | POA: Diagnosis not present

## 2016-05-09 DIAGNOSIS — J449 Chronic obstructive pulmonary disease, unspecified: Secondary | ICD-10-CM | POA: Diagnosis not present

## 2016-05-09 DIAGNOSIS — I110 Hypertensive heart disease with heart failure: Secondary | ICD-10-CM | POA: Diagnosis not present

## 2016-05-09 DIAGNOSIS — I5043 Acute on chronic combined systolic (congestive) and diastolic (congestive) heart failure: Secondary | ICD-10-CM | POA: Diagnosis not present

## 2016-05-09 DIAGNOSIS — M1990 Unspecified osteoarthritis, unspecified site: Secondary | ICD-10-CM | POA: Diagnosis not present

## 2016-05-09 DIAGNOSIS — S93402D Sprain of unspecified ligament of left ankle, subsequent encounter: Secondary | ICD-10-CM | POA: Diagnosis not present

## 2016-05-09 DIAGNOSIS — I272 Pulmonary hypertension, unspecified: Secondary | ICD-10-CM | POA: Diagnosis not present

## 2016-05-11 DIAGNOSIS — I272 Pulmonary hypertension, unspecified: Secondary | ICD-10-CM | POA: Diagnosis not present

## 2016-05-11 DIAGNOSIS — M1990 Unspecified osteoarthritis, unspecified site: Secondary | ICD-10-CM | POA: Diagnosis not present

## 2016-05-11 DIAGNOSIS — I5043 Acute on chronic combined systolic (congestive) and diastolic (congestive) heart failure: Secondary | ICD-10-CM | POA: Diagnosis not present

## 2016-05-11 DIAGNOSIS — J449 Chronic obstructive pulmonary disease, unspecified: Secondary | ICD-10-CM | POA: Diagnosis not present

## 2016-05-11 DIAGNOSIS — I110 Hypertensive heart disease with heart failure: Secondary | ICD-10-CM | POA: Diagnosis not present

## 2016-05-11 DIAGNOSIS — S93402D Sprain of unspecified ligament of left ankle, subsequent encounter: Secondary | ICD-10-CM | POA: Diagnosis not present

## 2016-05-16 DIAGNOSIS — I5043 Acute on chronic combined systolic (congestive) and diastolic (congestive) heart failure: Secondary | ICD-10-CM | POA: Diagnosis not present

## 2016-05-16 DIAGNOSIS — I272 Pulmonary hypertension, unspecified: Secondary | ICD-10-CM | POA: Diagnosis not present

## 2016-05-16 DIAGNOSIS — S93402D Sprain of unspecified ligament of left ankle, subsequent encounter: Secondary | ICD-10-CM | POA: Diagnosis not present

## 2016-05-16 DIAGNOSIS — I110 Hypertensive heart disease with heart failure: Secondary | ICD-10-CM | POA: Diagnosis not present

## 2016-05-16 DIAGNOSIS — J449 Chronic obstructive pulmonary disease, unspecified: Secondary | ICD-10-CM | POA: Diagnosis not present

## 2016-05-16 DIAGNOSIS — M1990 Unspecified osteoarthritis, unspecified site: Secondary | ICD-10-CM | POA: Diagnosis not present

## 2016-05-18 DIAGNOSIS — M1990 Unspecified osteoarthritis, unspecified site: Secondary | ICD-10-CM | POA: Diagnosis not present

## 2016-05-18 DIAGNOSIS — S93402D Sprain of unspecified ligament of left ankle, subsequent encounter: Secondary | ICD-10-CM | POA: Diagnosis not present

## 2016-05-18 DIAGNOSIS — I110 Hypertensive heart disease with heart failure: Secondary | ICD-10-CM | POA: Diagnosis not present

## 2016-05-18 DIAGNOSIS — I272 Pulmonary hypertension, unspecified: Secondary | ICD-10-CM | POA: Diagnosis not present

## 2016-05-18 DIAGNOSIS — J449 Chronic obstructive pulmonary disease, unspecified: Secondary | ICD-10-CM | POA: Diagnosis not present

## 2016-05-18 DIAGNOSIS — I5043 Acute on chronic combined systolic (congestive) and diastolic (congestive) heart failure: Secondary | ICD-10-CM | POA: Diagnosis not present

## 2016-05-23 ENCOUNTER — Telehealth: Payer: Self-pay | Admitting: Family Medicine

## 2016-05-23 DIAGNOSIS — I110 Hypertensive heart disease with heart failure: Secondary | ICD-10-CM | POA: Diagnosis not present

## 2016-05-23 DIAGNOSIS — I5043 Acute on chronic combined systolic (congestive) and diastolic (congestive) heart failure: Secondary | ICD-10-CM | POA: Diagnosis not present

## 2016-05-23 DIAGNOSIS — J449 Chronic obstructive pulmonary disease, unspecified: Secondary | ICD-10-CM | POA: Diagnosis not present

## 2016-05-23 DIAGNOSIS — I272 Pulmonary hypertension, unspecified: Secondary | ICD-10-CM | POA: Diagnosis not present

## 2016-05-23 DIAGNOSIS — S93402D Sprain of unspecified ligament of left ankle, subsequent encounter: Secondary | ICD-10-CM | POA: Diagnosis not present

## 2016-05-23 DIAGNOSIS — M1990 Unspecified osteoarthritis, unspecified site: Secondary | ICD-10-CM | POA: Diagnosis not present

## 2016-05-23 NOTE — Telephone Encounter (Signed)
Ok to give VO as requested.

## 2016-05-23 NOTE — Telephone Encounter (Signed)
Caller name: Cindee Salt Relation to pt: PT from [facility] back number: [phone]   Reason for call:  Requesting verbal 2x for 1 week

## 2016-05-24 NOTE — Telephone Encounter (Signed)
Called [facility] left detailed message of verbal ok 

## 2016-05-25 DIAGNOSIS — I110 Hypertensive heart disease with heart failure: Secondary | ICD-10-CM | POA: Diagnosis not present

## 2016-05-25 DIAGNOSIS — I5043 Acute on chronic combined systolic (congestive) and diastolic (congestive) heart failure: Secondary | ICD-10-CM | POA: Diagnosis not present

## 2016-05-25 DIAGNOSIS — I272 Pulmonary hypertension, unspecified: Secondary | ICD-10-CM | POA: Diagnosis not present

## 2016-05-25 DIAGNOSIS — J449 Chronic obstructive pulmonary disease, unspecified: Secondary | ICD-10-CM | POA: Diagnosis not present

## 2016-05-25 DIAGNOSIS — M1990 Unspecified osteoarthritis, unspecified site: Secondary | ICD-10-CM | POA: Diagnosis not present

## 2016-05-25 DIAGNOSIS — S93402D Sprain of unspecified ligament of left ankle, subsequent encounter: Secondary | ICD-10-CM | POA: Diagnosis not present

## 2016-06-01 DIAGNOSIS — I5043 Acute on chronic combined systolic (congestive) and diastolic (congestive) heart failure: Secondary | ICD-10-CM | POA: Diagnosis not present

## 2016-06-01 DIAGNOSIS — S93402D Sprain of unspecified ligament of left ankle, subsequent encounter: Secondary | ICD-10-CM | POA: Diagnosis not present

## 2016-06-01 DIAGNOSIS — M1990 Unspecified osteoarthritis, unspecified site: Secondary | ICD-10-CM | POA: Diagnosis not present

## 2016-06-01 DIAGNOSIS — I272 Pulmonary hypertension, unspecified: Secondary | ICD-10-CM | POA: Diagnosis not present

## 2016-06-01 DIAGNOSIS — I110 Hypertensive heart disease with heart failure: Secondary | ICD-10-CM | POA: Diagnosis not present

## 2016-06-01 DIAGNOSIS — J449 Chronic obstructive pulmonary disease, unspecified: Secondary | ICD-10-CM | POA: Diagnosis not present

## 2016-06-14 ENCOUNTER — Ambulatory Visit (INDEPENDENT_AMBULATORY_CARE_PROVIDER_SITE_OTHER): Payer: Medicare Other | Admitting: *Deleted

## 2016-06-14 DIAGNOSIS — I442 Atrioventricular block, complete: Secondary | ICD-10-CM

## 2016-06-14 NOTE — Progress Notes (Signed)
Remote pacemaker transmission.   

## 2016-06-15 ENCOUNTER — Encounter: Payer: Self-pay | Admitting: Cardiology

## 2016-06-16 LAB — CUP PACEART REMOTE DEVICE CHECK
Battery Impedance: 111 Ohm
Battery Voltage: 2.79 V
Brady Statistic AS VS Percent: 11 %
Date Time Interrogation Session: 20180318194145
Implantable Lead Implant Date: 20160701
Implantable Lead Implant Date: 20160701
Implantable Lead Location: 753860
Implantable Lead Model: 5076
Lead Channel Impedance Value: 480 Ohm
Lead Channel Pacing Threshold Amplitude: 0.625 V
Lead Channel Pacing Threshold Amplitude: 0.625 V
Lead Channel Pacing Threshold Pulse Width: 0.4 ms
Lead Channel Setting Pacing Amplitude: 1.5 V
Lead Channel Setting Pacing Amplitude: 2.5 V
Lead Channel Setting Pacing Pulse Width: 0.4 ms
Lead Channel Setting Sensing Sensitivity: 2.8 mV
MDC IDC LEAD LOCATION: 753859
MDC IDC MSMT BATTERY REMAINING LONGEVITY: 148 mo
MDC IDC MSMT LEADCHNL RA IMPEDANCE VALUE: 442 Ohm
MDC IDC MSMT LEADCHNL RA PACING THRESHOLD PULSEWIDTH: 0.4 ms
MDC IDC PG IMPLANT DT: 20160701
MDC IDC STAT BRADY AP VP PERCENT: 0 %
MDC IDC STAT BRADY AP VS PERCENT: 89 %
MDC IDC STAT BRADY AS VP PERCENT: 0 %

## 2016-06-23 ENCOUNTER — Ambulatory Visit (INDEPENDENT_AMBULATORY_CARE_PROVIDER_SITE_OTHER): Payer: Medicare Other | Admitting: Family Medicine

## 2016-06-23 ENCOUNTER — Encounter: Payer: Self-pay | Admitting: Family Medicine

## 2016-06-23 VITALS — BP 116/62 | Ht 61.0 in | Wt 109.6 lb

## 2016-06-23 DIAGNOSIS — E782 Mixed hyperlipidemia: Secondary | ICD-10-CM

## 2016-06-23 DIAGNOSIS — I10 Essential (primary) hypertension: Secondary | ICD-10-CM | POA: Diagnosis not present

## 2016-06-23 DIAGNOSIS — I509 Heart failure, unspecified: Secondary | ICD-10-CM | POA: Diagnosis not present

## 2016-06-23 DIAGNOSIS — M810 Age-related osteoporosis without current pathological fracture: Secondary | ICD-10-CM | POA: Diagnosis not present

## 2016-06-23 DIAGNOSIS — E43 Unspecified severe protein-calorie malnutrition: Secondary | ICD-10-CM

## 2016-06-23 DIAGNOSIS — I4891 Unspecified atrial fibrillation: Secondary | ICD-10-CM | POA: Diagnosis not present

## 2016-06-23 DIAGNOSIS — E559 Vitamin D deficiency, unspecified: Secondary | ICD-10-CM | POA: Diagnosis not present

## 2016-06-23 DIAGNOSIS — R634 Abnormal weight loss: Secondary | ICD-10-CM

## 2016-06-23 DIAGNOSIS — R739 Hyperglycemia, unspecified: Secondary | ICD-10-CM

## 2016-06-23 LAB — COMPREHENSIVE METABOLIC PANEL
ALT: 11 U/L (ref 6–29)
AST: 17 U/L (ref 10–35)
Albumin: 4.1 g/dL (ref 3.6–5.1)
Alkaline Phosphatase: 74 U/L (ref 33–130)
BUN: 19 mg/dL (ref 7–25)
CO2: 21 mmol/L (ref 20–31)
Calcium: 9.6 mg/dL (ref 8.6–10.4)
Chloride: 103 mmol/L (ref 98–110)
Creat: 0.68 mg/dL (ref 0.60–0.88)
GLUCOSE: 90 mg/dL (ref 65–99)
POTASSIUM: 4.6 mmol/L (ref 3.5–5.3)
Sodium: 140 mmol/L (ref 135–146)
TOTAL PROTEIN: 7.4 g/dL (ref 6.1–8.1)
Total Bilirubin: 0.4 mg/dL (ref 0.2–1.2)

## 2016-06-23 LAB — CBC
HEMATOCRIT: 38.7 % (ref 35.0–45.0)
Hemoglobin: 12.9 g/dL (ref 11.7–15.5)
MCH: 29.5 pg (ref 27.0–33.0)
MCHC: 33.3 g/dL (ref 32.0–36.0)
MCV: 88.4 fL (ref 80.0–100.0)
MPV: 9.4 fL (ref 7.5–12.5)
Platelets: 309 10*3/uL (ref 140–400)
RBC: 4.38 MIL/uL (ref 3.80–5.10)
RDW: 13.9 % (ref 11.0–15.0)
WBC: 8 10*3/uL (ref 3.8–10.8)

## 2016-06-23 LAB — LIPID PANEL
CHOL/HDL RATIO: 2.3 ratio (ref <5.0)
CHOLESTEROL: 185 mg/dL (ref <200)
HDL: 81 mg/dL (ref >50)
LDL Cholesterol: 91 mg/dL (ref <100)
TRIGLYCERIDES: 63 mg/dL (ref <150)
VLDL: 13 mg/dL (ref <30)

## 2016-06-23 LAB — TSH: TSH: 2.56 mIU/L

## 2016-06-23 NOTE — Progress Notes (Signed)
Pre visit review using our clinic review tool, if applicable. No additional management support is needed unless otherwise documented below in the visit note. 

## 2016-06-23 NOTE — Patient Instructions (Signed)

## 2016-06-23 NOTE — Progress Notes (Signed)
Patient ID: Beth [name], female   DOB: 1924-01-04, 83 y.o.   MRN: [id number]   Subjective:  CMA scribed for Dr Charlett Blake  Patient ID: Beth [name], female    DOB: 04-23-1923, 83 y.o.   MRN: [id number]  Chief Complaint  Patient presents with  . Follow-up    Vitamin D Deficiency F/U.    Hypertension  This is a chronic problem. The problem is controlled. Associated symptoms include malaise/fatigue. Pertinent negatives include no blurred vision, chest pain, headaches, palpitations or shortness of breath.    Patient is in today for a 68-month F/U. Patient was Hospitalized on 03/09/2017 for COPD exacerbation. Patient has a Hx of lung nodule, CHF, osteoporosis. Patient also states that she a a sore spot in/near her right ear. States that it isn't painful, just aggravating knowing that it was there. No additional acute concerns noted at this time. No recent febrile illness or hospitalizations. Denies CP/palp/SOB/HA/congestion/fevers/GI or GU c/o. Taking meds as prescribed  Patient Care Team: Mosie Lukes, MD as PCP - General (Family Medicine)   Past Medical History:  Diagnosis Date  . Abdominal pain, unspecified site 08/17/2012  . Acute on chronic combined systolic and diastolic heart failure ([place])   . Allergic state 06/22/2013  . Allergy   . Arthritis   . Atrial fibrillation with rapid ventricular response ([place])   . COPD (chronic obstructive pulmonary disease) ([place])   . Dyspnea   . Glaucoma 07/06/2014  . Hyperglycemia 02/01/2016  . Hypertension   . Hypokalemia 02/2016   Due to Lasix  . Increased urinary frequency 01/02/2014  . Left ankle sprain   . Loss of weight 01/19/2013  . Lung nodule    CT of chest 2007-left loewr lobar mass- evaluated by pulmonary- patient refused further work up  . Osteoporosis   . Other and unspecified hyperlipidemia 08/17/2012  . Pulmonary HTN   . Syncope    fall  . Systolic CHF ([place])    EF 83-38% 09/2014  . Tobacco abuse 02/2016   Quit 2 years ago  .  Vertigo     Past Surgical History:  Procedure Laterality Date  . ABDOMINAL HYSTERECTOMY    . APPENDECTOMY    . CESAREAN SECTION    . CHOLECYSTECTOMY    . EP IMPLANTABLE DEVICE N/A 09/26/2014   Procedure: Pacemaker Implant;  Surgeon: Evans Lance, MD;  Location: [facility];  Service: Cardiovascular;  Laterality: N/A;  . HEMORRHOID SURGERY    . HERNIA REPAIR    . THORACENTESIS Right 01/07/15   Exudative Effusion Lymphocyte predominant    Family History  Problem Relation Age of Onset  . Heart disease Mother   . Hypertension Mother   . Diabetes Mother   . Stroke Mother   . Cancer Brother   . Diabetes    . Cancer      lung- in distant releatives  . Lupus Brother   . Cholelithiasis Father   . Nephrolithiasis Father     Social History   Social History  . Marital status: Widowed    Spouse name: N/A  . Number of children: N/A  . Years of education: N/A   Occupational History  . Not on file.   Social History Main Topics  . Smoking status: Former Smoker    Packs/day: 0.10    Years: 60.00    Types: Cigarettes  . Smokeless tobacco: Never Used     Comment: patient has stopped smoking on September 25, 2014  .  Alcohol use No  . Drug use: No  . Sexual activity: No   Other Topics Concern  . Not on file   Social History Narrative   [facility]:   Patient is originally from [place]. She is lived in [place],, [place], [place], [place], and [place]. She moved to [place] in 2006. 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 No pet exposure. Remote bird exposure. No mold exposure.    Outpatient Medications Prior to Visit  Medication Sig Dispense Refill  . aspirin 81 MG chewable tablet Chew 81 mg by mouth daily.     . cetirizine (ZYRTEC) 10 MG tablet Take 10 mg by mouth every morning.    . diltiazem (TIAZAC) 240 MG 24 hr capsule TAKE 1 CAPSULE (240 MG TOTAL) BY MOUTH DAILY. 90 capsule 1  . fish  oil-omega-3 fatty acids 1000 MG capsule Take 1 g by mouth daily at 12 noon.     . flecainide (TAMBOCOR) 50 MG tablet TAKE 1 TABLET BY MOUTH EVERY 12 HOURS 60 tablet 5  . furosemide (LASIX) 20 MG tablet TAKE 1 TABLET (20 MG TOTAL) BY MOUTH DAILY AS NEEDED FOR EDEMA. 20 tablet 5  . metoprolol succinate (TOPROL-XL) 25 MG 24 hr tablet TAKE 1 TABLET BY MOUTH EVERY DAY 90 tablet 1  . Multiple Vitamin (MULTIVITAMIN) tablet Take 1 tablet by mouth daily at 12 noon.     Marland Kitchen NUTRITIONAL SUPPLEMENT LIQD Take 120 mLs by mouth daily. MedPass    . omeprazole (PRILOSEC) 20 MG capsule Take 20 mg by mouth daily.     . potassium chloride (K-DUR,KLOR-CON) 10 MEQ tablet Take 1 tablet (10 mEq total) by mouth daily as needed (for when you take Lasix). 30 tablet 5  . Probiotic Product (PROBIOTIC ADVANCED) CAPS Take 2 capsules by mouth daily. Florastor 250 mg     No facility-administered medications prior to visit.     Allergies  Allergen Reactions  . Penicillins Shortness Of Breath, Diarrhea and Other (See Comments)    Stomach cramping  . Ciprofloxacin Other (See Comments)    myalgias  . Fexofenadine Other (See Comments)    unknown    Review of Systems  Constitutional: Positive for malaise/fatigue. Negative for fever.  HENT: Negative for congestion.        Sore spot on/near R ear.  Eyes: Negative for blurred vision.  Respiratory: Negative for cough and shortness of breath.   Cardiovascular: Negative for chest pain, palpitations and leg swelling.  Gastrointestinal: Negative for vomiting.  Musculoskeletal: Negative for back pain.  Skin: Negative for rash.  Neurological: Negative for loss of consciousness and headaches.       Objective:    Physical Exam  Constitutional: She is oriented to person, place, and time. She appears well-developed and well-nourished. No distress.  HENT:  Head: Normocephalic and atraumatic.  Eyes: Conjunctivae are normal.  Neck: Normal range of motion. No thyromegaly present.    Cardiovascular: Normal rate and regular rhythm.   Pulmonary/Chest: Effort normal and breath sounds normal. She has no wheezes.  Abdominal: Soft. Bowel sounds are normal. There is no tenderness.  Musculoskeletal: She exhibits no edema or deformity.  Neurological: She is alert and oriented to person, place, and time.  Skin: Skin is warm and dry. She is not diaphoretic.  Psychiatric: She has a normal mood and affect.    BP 116/62   Ht 5\' 1"  (1.549 m)   Wt 109 lb 9.6 oz (49.7  kg)   BMI 20.71 kg/m  Wt Readings from Last 3 Encounters:  06/23/16 109 lb 9.6 oz (49.7 kg)  04/04/16 105 lb 13.1 oz (48 kg)  03/15/16 105 lb 12.8 oz (48 kg)   BP Readings from Last 3 Encounters:  06/23/16 116/62  04/04/16 140/81  03/15/16 (!) 143/65     Immunization History  Administered Date(s) Administered  . Pneumococcal Conjugate-13 06/11/2015  . Pneumococcal Polysaccharide-23 05/19/2008  . Td 10/07/2003    Health Maintenance  Topic Date Due  . FOOT EXAM  12/28/1935  . OPHTHALMOLOGY EXAM  12/28/1935  . URINE MICROALBUMIN  12/28/1935  . TETANUS/TDAP  10/06/2013  . INFLUENZA VACCINE  03/13/2017 (Originally 10/26/2016)  . HEMOGLOBIN A1C  12/24/2016  . DEXA SCAN  Completed  . PNA vac Low Risk Adult  Completed    Lab Results  Component Value Date   WBC 8.0 06/23/2016   HGB 12.9 06/23/2016   HCT 38.7 06/23/2016   PLT 309 06/23/2016   GLUCOSE 90 06/23/2016   CHOL 185 06/23/2016   TRIG 63 06/23/2016   HDL 81 06/23/2016   LDLDIRECT 127.9 10/05/2011   LDLCALC 91 06/23/2016   ALT 11 06/23/2016   AST 17 06/23/2016   NA 140 06/23/2016   K 4.6 06/23/2016   CL 103 06/23/2016   CREATININE 0.68 06/23/2016   BUN 19 06/23/2016   CO2 21 06/23/2016   TSH 2.56 06/23/2016   INR 1.27 09/27/2014   HGBA1C 5.3 06/23/2016    Lab Results  Component Value Date   TSH 2.56 06/23/2016   Lab Results  Component Value Date   WBC 8.0 06/23/2016   HGB 12.9 06/23/2016   HCT 38.7 06/23/2016   MCV 88.4  06/23/2016   PLT 309 06/23/2016   Lab Results  Component Value Date   NA 140 06/23/2016   K 4.6 06/23/2016   CO2 21 06/23/2016   GLUCOSE 90 06/23/2016   BUN 19 06/23/2016   CREATININE 0.68 06/23/2016   BILITOT 0.4 06/23/2016   ALKPHOS 74 06/23/2016   AST 17 06/23/2016   ALT 11 06/23/2016   PROT 7.4 06/23/2016   ALBUMIN 4.1 06/23/2016   CALCIUM 9.6 06/23/2016   ANIONGAP 8 03/13/2016   GFR 84.93 02/01/2016   Lab Results  Component Value Date   CHOL 185 06/23/2016   Lab Results  Component Value Date   HDL 81 06/23/2016   Lab Results  Component Value Date   LDLCALC 91 06/23/2016   Lab Results  Component Value Date   TRIG 63 06/23/2016   Lab Results  Component Value Date   CHOLHDL 2.3 06/23/2016   Lab Results  Component Value Date   HGBA1C 5.3 06/23/2016         Assessment & Plan:   Problem List Items Addressed This Visit    Essential hypertension (Chronic)    Improved on recheck no changes today      Vitamin D deficiency    Encouraged ongoing daily suplements      Relevant Orders   VITAMIN D 25 Hydroxy (Vit-D Deficiency, Fractures) (Completed)   Osteoporosis    Encouraged to get adequate exercise, calcium and vitamin d intake      Hyperlipidemia   Relevant Orders   Lipid panel (Completed)   Loss of weight    Weight up 3 pounds from last visit, encouraged ongoing increased protein intake.      Protein-calorie malnutrition, severe ([place])   Relevant Orders   Comprehensive metabolic panel (Completed)  Atrial fibrillation ([place]) - Primary   Relevant Orders   Comprehensive metabolic panel (Completed)   CBC (Completed)   TSH (Completed)   CHF (congestive heart failure) ([place])   Relevant Orders   CBC (Completed)   Hyperglycemia    minimize simple carbs. Increase exercise as tolerated.       Relevant Orders   Hemoglobin A1c (Completed)      I am having Ms. Perrault maintain her multivitamin, fish oil-omega-3 fatty acids, cetirizine,  PROBIOTIC ADVANCED, aspirin, flecainide, furosemide, potassium chloride, diltiazem, metoprolol succinate, omeprazole, and NUTRITIONAL SUPPLEMENT.  No orders of the defined types were placed in this encounter.   CMA served as Education administrator during this visit. History, Physical and Plan performed by medical provider. Documentation and orders reviewed and attested to.  Penni Homans, MD

## 2016-06-24 LAB — HEMOGLOBIN A1C
HEMOGLOBIN A1C: 5.3 % (ref <5.7)
MEAN PLASMA GLUCOSE: 105 mg/dL

## 2016-06-24 LAB — VITAMIN D 25 HYDROXY (VIT D DEFICIENCY, FRACTURES): Vit D, 25-Hydroxy: 29 ng/mL — ABNORMAL LOW (ref 30–100)

## 2016-06-26 NOTE — Assessment & Plan Note (Signed)
Improved on recheck no changes today

## 2016-06-26 NOTE — Assessment & Plan Note (Signed)
Encouraged ongoing daily suplements

## 2016-06-26 NOTE — Assessment & Plan Note (Signed)
Weight up 3 pounds from last visit, encouraged ongoing increased protein intake.

## 2016-06-26 NOTE — Assessment & Plan Note (Signed)
Encouraged to get adequate exercise, calcium and vitamin d intake 

## 2016-06-26 NOTE — Assessment & Plan Note (Signed)
minimize simple carbs. Increase exercise as tolerated.  

## 2016-06-29 ENCOUNTER — Encounter: Payer: Self-pay | Admitting: Cardiology

## 2016-08-01 ENCOUNTER — Other Ambulatory Visit: Payer: Self-pay | Admitting: Family Medicine

## 2016-08-04 ENCOUNTER — Other Ambulatory Visit: Payer: Self-pay | Admitting: Family Medicine

## 2016-08-04 MED ORDER — FLECAINIDE ACETATE 50 MG PO TABS: 50.0000 mg | ORAL_TABLET | Freq: Two times a day (BID) | ORAL | 1 refills | Status: DC

## 2016-08-09 ENCOUNTER — Other Ambulatory Visit: Payer: Self-pay | Admitting: Family Medicine

## 2016-08-25 ENCOUNTER — Telehealth: Payer: Self-pay | Admitting: Family Medicine

## 2016-08-25 NOTE — Telephone Encounter (Signed)
Pt declined AWV with nurse Emmi Campaign call

## 2016-09-29 ENCOUNTER — Other Ambulatory Visit: Payer: Self-pay | Admitting: Family Medicine

## 2016-10-11 ENCOUNTER — Encounter: Payer: Self-pay | Admitting: Internal Medicine

## 2016-10-11 ENCOUNTER — Ambulatory Visit (INDEPENDENT_AMBULATORY_CARE_PROVIDER_SITE_OTHER): Payer: Medicare Other | Admitting: Internal Medicine

## 2016-10-11 VITALS — BP 148/70 | HR 74 | Ht 61.0 in | Wt 107.4 lb

## 2016-10-11 DIAGNOSIS — Z950 Presence of cardiac pacemaker: Secondary | ICD-10-CM | POA: Diagnosis not present

## 2016-10-11 DIAGNOSIS — I442 Atrioventricular block, complete: Secondary | ICD-10-CM | POA: Diagnosis not present

## 2016-10-11 NOTE — Patient Instructions (Signed)

## 2016-10-11 NOTE — Progress Notes (Signed)
HPI Beth [name] returns today for followup. She is a pleasant elderly woman with symptomatic bradycardia, s/p PPM insertion.  No syncope. She is fairly sedentary. She has had no recurrent syncope since her PPM insertion. She wonders if she should take fish oil. Allergies  Allergen Reactions  . Penicillins Shortness Of Breath, Diarrhea and Other (See Comments)    Stomach cramping  . Ciprofloxacin Other (See Comments)    myalgias  . Fexofenadine Other (See Comments)    unknown     Current Outpatient Prescriptions  Medication Sig Dispense Refill  . aspirin 81 MG chewable tablet Chew 81 mg by mouth daily.     . cetirizine (ZYRTEC) 10 MG tablet Take 10 mg by mouth every morning.    . diltiazem (CARDIZEM CD) 240 MG 24 hr capsule TAKE 1 CAPSULE (240 MG TOTAL) BY MOUTH DAILY. 90 capsule 1  . fish oil-omega-3 fatty acids 1000 MG capsule Take 1 g by mouth daily at 12 noon.     . flecainide (TAMBOCOR) 50 MG tablet TAKE 1 TABLET BY MOUTH EVERY 12 HOURS 60 tablet 1  . furosemide (LASIX) 20 MG tablet TAKE 1 TABLET (20 MG TOTAL) BY MOUTH DAILY AS NEEDED FOR EDEMA. 20 tablet 5  . metoprolol succinate (TOPROL-XL) 25 MG 24 hr tablet TAKE 1 TABLET BY MOUTH EVERY DAY 90 tablet 1  . Multiple Vitamin (MULTIVITAMIN) tablet Take 1 tablet by mouth daily at 12 noon.     Marland Kitchen NUTRITIONAL SUPPLEMENT LIQD Take 120 mLs by mouth daily. MedPass    . omeprazole (PRILOSEC) 20 MG capsule Take 20 mg by mouth daily.     . potassium chloride (K-DUR,KLOR-CON) 10 MEQ tablet Take 1 tablet (10 mEq total) by mouth daily as needed (for when you take Lasix). 30 tablet 5  . Probiotic Product (PROBIOTIC ADVANCED) CAPS Take 2 capsules by mouth daily. Florastor 250 mg     No current facility-administered medications for this visit.      Past Medical History:  Diagnosis Date  . Abdominal pain, unspecified site 08/17/2012  . Acute on chronic combined systolic and diastolic heart failure ([place])   . Allergic state 06/22/2013    . Allergy   . Arthritis   . Atrial fibrillation with rapid ventricular response ([place])   . COPD (chronic obstructive pulmonary disease) ([place])   . Dyspnea   . Glaucoma 07/06/2014  . Hyperglycemia 02/01/2016  . Hypertension   . Hypokalemia 02/2016   Due to Lasix  . Increased urinary frequency 01/02/2014  . Left ankle sprain   . Loss of weight 01/19/2013  . Lung nodule    CT of chest 2007-left loewr lobar mass- evaluated by pulmonary- patient refused further work up  . Osteoporosis   . Other and unspecified hyperlipidemia 08/17/2012  . Pulmonary HTN ([place])   . Syncope    fall  . Systolic CHF ([place])    EF 29-51% 09/2014  . Tobacco abuse 02/2016   Quit 2 years ago  . Vertigo     ROS:   All systems reviewed and negative except as noted in the HPI.   Past Surgical History:  Procedure Laterality Date  . ABDOMINAL HYSTERECTOMY    . APPENDECTOMY    . CESAREAN SECTION    . CHOLECYSTECTOMY    . EP IMPLANTABLE DEVICE N/A 09/26/2014   Procedure: Pacemaker Implant;  Surgeon: Evans Lance, MD;  Location: [facility];  Service: Cardiovascular;  Laterality: N/A;  . HEMORRHOID SURGERY    .  HERNIA REPAIR    . THORACENTESIS Right 01/07/15   Exudative Effusion Lymphocyte predominant     Family History  Problem Relation Age of Onset  . Heart disease Mother   . Hypertension Mother   . Diabetes Mother   . Stroke Mother   . Cancer Brother   . Diabetes Unknown   . Cancer Unknown        lung- in distant releatives  . Lupus Brother   . Cholelithiasis Father   . Nephrolithiasis Father      Social History   Social History  . Marital status: Widowed    Spouse name: N/A  . Number of children: N/A  . Years of education: N/A   Occupational History  . Not on file.   Social History Main Topics  . Smoking status: Former Smoker    Packs/day: 0.10    Years: 60.00    Types: Cigarettes  . Smokeless tobacco: Never Used     Comment: patient has stopped smoking on September 25, 2014   . Alcohol use No  . Drug use: No  . Sexual activity: No   Other Topics Concern  . Not on file   Social History Narrative   [facility]:   Patient is originally from [place]. She is lived in [place],, [place], [place], [place], and [place]. She moved to [place] in 2006. 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 No pet exposure. Remote bird exposure. No mold exposure.     BP (!) 148/70   Pulse 74   Ht 5\' 1"  (1.549 m)   Wt 107 lb 6.4 oz (48.7 kg)   SpO2 93%   BMI 20.29 kg/m   Physical Exam:  Well appearing elderly woman, NAD HEENT: Unremarkable Neck:  6 cm JVD, no thyromegally Lymphatics:  No adenopathy Back:  No CVA tenderness Lungs:  Clear with no wheezes HEART:  IRegular rate rhythm, no murmurs, no rubs, no clicks Abd:  soft, positive bowel sounds, no organomegally, no rebound, no guarding Ext:  2 plus pulses, no edema, no cyanosis, no clubbing Skin:  No rashes no nodules Neuro:  CN II through XII intact, motor grossly intact  DEVICE  Normal device function.  See PaceArt for details.   Assess/Plan: 1. Sinus node dysfunction - she is now asymptomatic with no recurrent syncope 2. PAF - she is maintaining NSR. She has had bleeding on anti-coagulation and this was stopped 3. PPM - her device is working normally. Will recheck in several months.  Mikle Bosworth.D.

## 2016-10-17 ENCOUNTER — Other Ambulatory Visit: Payer: Self-pay

## 2016-10-17 ENCOUNTER — Telehealth: Payer: Self-pay | Admitting: Internal Medicine

## 2016-10-17 NOTE — Telephone Encounter (Signed)
Reviewed transmission and spoke with patient's daughter. I explained that there were no episodes seen on her device. Daughter verbalized understanding and appreciated call.

## 2016-10-17 NOTE — Telephone Encounter (Signed)
New message    Pt daughter is calling. She said over the weekend her mother was nauseous and sweating. Daughter states they sent over a transmission. Daughter wants to know if was received and if it was her heart making her feel that way. Please call.

## 2016-10-27 ENCOUNTER — Ambulatory Visit (INDEPENDENT_AMBULATORY_CARE_PROVIDER_SITE_OTHER): Payer: Medicare Other | Admitting: Family Medicine

## 2016-10-27 ENCOUNTER — Ambulatory Visit (HOSPITAL_BASED_OUTPATIENT_CLINIC_OR_DEPARTMENT_OTHER)
Admission: RE | Admit: 2016-10-27 | Discharge: 2016-10-27 | Disposition: A | Discharge status: Home / Self Care | Payer: Medicare Other | Source: Ambulatory Visit | Attending: Family Medicine | Admitting: Family Medicine

## 2016-10-27 VITALS — BP 140/62 | HR 77 | Temp 97.7°F | Resp 18 | Wt 106.0 lb

## 2016-10-27 DIAGNOSIS — J449 Chronic obstructive pulmonary disease, unspecified: Secondary | ICD-10-CM | POA: Insufficient documentation

## 2016-10-27 DIAGNOSIS — E785 Hyperlipidemia, unspecified: Secondary | ICD-10-CM | POA: Diagnosis not present

## 2016-10-27 DIAGNOSIS — G8929 Other chronic pain: Secondary | ICD-10-CM | POA: Diagnosis not present

## 2016-10-27 DIAGNOSIS — E559 Vitamin D deficiency, unspecified: Secondary | ICD-10-CM | POA: Diagnosis not present

## 2016-10-27 DIAGNOSIS — I10 Essential (primary) hypertension: Secondary | ICD-10-CM | POA: Diagnosis not present

## 2016-10-27 DIAGNOSIS — I700 Atherosclerosis of aorta: Secondary | ICD-10-CM | POA: Insufficient documentation

## 2016-10-27 DIAGNOSIS — R918 Other nonspecific abnormal finding of lung field: Secondary | ICD-10-CM | POA: Diagnosis not present

## 2016-10-27 DIAGNOSIS — R0902 Hypoxemia: Secondary | ICD-10-CM | POA: Diagnosis present

## 2016-10-27 DIAGNOSIS — I4891 Unspecified atrial fibrillation: Secondary | ICD-10-CM | POA: Diagnosis not present

## 2016-10-27 DIAGNOSIS — M545 Low back pain: Secondary | ICD-10-CM | POA: Diagnosis not present

## 2016-10-27 DIAGNOSIS — R739 Hyperglycemia, unspecified: Secondary | ICD-10-CM

## 2016-10-27 DIAGNOSIS — M810 Age-related osteoporosis without current pathological fracture: Secondary | ICD-10-CM

## 2016-10-27 DIAGNOSIS — R0989 Other specified symptoms and signs involving the circulatory and respiratory systems: Secondary | ICD-10-CM

## 2016-10-27 DIAGNOSIS — N390 Urinary tract infection, site not specified: Secondary | ICD-10-CM

## 2016-10-27 MED ORDER — FLECAINIDE ACETATE 50 MG PO TABS: 50.0000 mg | ORAL_TABLET | Freq: Two times a day (BID) | ORAL | 1 refills | Status: DC

## 2016-10-27 MED ORDER — METOPROLOL SUCCINATE ER 25 MG PO TB24: 25.0000 mg | ORAL_TABLET | Freq: Every day | ORAL | 1 refills | Status: DC

## 2016-10-27 MED ORDER — FUROSEMIDE 20 MG PO TABS: ORAL_TABLET | ORAL | 5 refills | Status: DC

## 2016-10-27 MED ORDER — DILTIAZEM HCL ER COATED BEADS 240 MG PO CP24: ORAL_CAPSULE | ORAL | 1 refills | Status: DC

## 2016-10-27 MED ORDER — POTASSIUM CHLORIDE CRYS ER 10 MEQ PO TBCR
10.0000 meq | EXTENDED_RELEASE_TABLET | Freq: Every day | ORAL | 5 refills | Status: DC | PRN
Start: 2016-10-27 — End: 2017-05-29

## 2016-10-27 MED ORDER — ALBUTEROL SULFATE HFA 108 (90 BASE) MCG/ACT IN AERS: 2.0000 | INHALATION_SPRAY | Freq: Four times a day (QID) | RESPIRATORY_TRACT | 0 refills | Status: DC | PRN

## 2016-10-27 NOTE — Assessment & Plan Note (Signed)
Encouraged daily vitamin D level

## 2016-10-27 NOTE — Assessment & Plan Note (Addendum)
Well controlled, no changes to meds. Encouraged heart healthy diet such as the DASH diet and exercise as tolerated.  °

## 2016-10-27 NOTE — Progress Notes (Signed)
Subjective:  I acted as a Education administrator for Dr. Charlett Blake. Beth [name], [place]  Patient ID: Beth [name], female    DOB: July 24, 1923, 83 y.o.   MRN: [id number]  No chief complaint on file.   HPI  Patient is in today for a 4 month follow up. Patient c/o dysuria and frequency. She denies fevers, chills, hematuria. She continues to struggle with malaise. She is accompanied by family who report she continues to eat sparingly but does eat daily. No recent febrile illness or hospitalization. She has ongoing chronic low back pain which is perhaps slightly worse than usual. Denies CP/palp/SOB/HA/congestion/fevers/GI or c/o. Taking meds as prescribed  Patient Care Team: Mosie Lukes, MD as PCP - General (Family Medicine)   Past Medical History:  Diagnosis Date  . Abdominal pain, unspecified site 08/17/2012  . Acute on chronic combined systolic and diastolic heart failure ([place])   . Allergic state 06/22/2013  . Allergy   . Arthritis   . Atrial fibrillation with rapid ventricular response ([place])   . COPD (chronic obstructive pulmonary disease) ([place])   . Dyspnea   . Glaucoma 07/06/2014  . Hyperglycemia 02/01/2016  . Hypertension   . Hypokalemia 02/2016   Due to Lasix  . Increased urinary frequency 01/02/2014  . Left ankle sprain   . Loss of weight 01/19/2013  . Lung nodule    CT of chest 2007-left loewr lobar mass- evaluated by pulmonary- patient refused further work up  . Osteoporosis   . Other and unspecified hyperlipidemia 08/17/2012  . Pulmonary HTN ([place])   . Syncope    fall  . Systolic CHF ([place])    EF 40-98% 09/2014  . Tobacco abuse 02/2016   Quit 2 years ago  . Vertigo     Past Surgical History:  Procedure Laterality Date  . ABDOMINAL HYSTERECTOMY    . APPENDECTOMY    . CESAREAN SECTION    . CHOLECYSTECTOMY    . EP IMPLANTABLE DEVICE N/A 09/26/2014   Procedure: Pacemaker Implant;  Surgeon: Evans Lance, MD;  Location: [facility];  Service: Cardiovascular;  Laterality: N/A;  .  HEMORRHOID SURGERY    . HERNIA REPAIR    . THORACENTESIS Right 01/07/15   Exudative Effusion Lymphocyte predominant    Family History  Problem Relation Age of Onset  . Heart disease Mother   . Hypertension Mother   . Diabetes Mother   . Stroke Mother   . Cancer Brother   . Diabetes Unknown   . Cancer Unknown        lung- in distant releatives  . Lupus Brother   . Cholelithiasis Father   . Nephrolithiasis Father     Social History   Social History  . Marital status: Widowed    Spouse name: N/A  . Number of children: N/A  . Years of education: N/A   Occupational History  . Not on file.   Social History Main Topics  . Smoking status: Former Smoker    Packs/day: 0.10    Years: 60.00    Types: Cigarettes  . Smokeless tobacco: Never Used     Comment: patient has stopped smoking on September 25, 2014  . Alcohol use No  . Drug use: No  . Sexual activity: No   Other Topics Concern  . Not on file   Social History Narrative   [facility]:   Patient is originally from [place]. She is lived in [place],, [place], [place], [place], and [place]. She moved to  [place] in 2006. 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 No pet exposure. Remote bird exposure. No mold exposure.    Outpatient Medications Prior to Visit  Medication Sig Dispense Refill  . aspirin 81 MG chewable tablet Chew 81 mg by mouth daily.     . cetirizine (ZYRTEC) 10 MG tablet Take 10 mg by mouth every morning.    . fish oil-omega-3 fatty acids 1000 MG capsule Take 1 g by mouth daily at 12 noon.     . Multiple Vitamin (MULTIVITAMIN) tablet Take 1 tablet by mouth daily at 12 noon.     Marland Kitchen NUTRITIONAL SUPPLEMENT LIQD Take 120 mLs by mouth daily. MedPass    . omeprazole (PRILOSEC) 20 MG capsule Take 20 mg by mouth daily.     . Probiotic Product (PROBIOTIC ADVANCED) CAPS Take 2 capsules by mouth daily. Florastor 250 mg    . diltiazem (CARDIZEM CD)  240 MG 24 hr capsule TAKE 1 CAPSULE (240 MG TOTAL) BY MOUTH DAILY. 90 capsule 1  . flecainide (TAMBOCOR) 50 MG tablet TAKE 1 TABLET BY MOUTH EVERY 12 HOURS 60 tablet 1  . furosemide (LASIX) 20 MG tablet TAKE 1 TABLET (20 MG TOTAL) BY MOUTH DAILY AS NEEDED FOR EDEMA. 20 tablet 5  . metoprolol succinate (TOPROL-XL) 25 MG 24 hr tablet TAKE 1 TABLET BY MOUTH EVERY DAY 90 tablet 1  . potassium chloride (K-DUR,KLOR-CON) 10 MEQ tablet Take 1 tablet (10 mEq total) by mouth daily as needed (for when you take Lasix). 30 tablet 5   No facility-administered medications prior to visit.     Allergies  Allergen Reactions  . Penicillins Shortness Of Breath, Diarrhea and Other (See Comments)    Stomach cramping  . Ciprofloxacin Other (See Comments)    myalgias  . Fexofenadine Other (See Comments)    unknown    Review of Systems  Constitutional: Positive for malaise/fatigue. Negative for fever.  HENT: Negative for congestion.   Eyes: Negative for blurred vision.  Respiratory: Negative for shortness of breath.   Cardiovascular: Negative for chest pain, palpitations and leg swelling.  Gastrointestinal: Negative for abdominal pain, blood in stool and nausea.  Genitourinary: Negative for dysuria and frequency.  Musculoskeletal: Positive for back pain. Negative for falls.  Skin: Negative for rash.  Neurological: Negative for dizziness, loss of consciousness and headaches.  Endo/Heme/Allergies: Negative for environmental allergies.  Psychiatric/Behavioral: Negative for depression. The patient is not nervous/anxious.        Objective:    Physical Exam  Constitutional: She is oriented to person, place, and time. She appears well-developed and well-nourished. No distress.  HENT:  Head: Normocephalic and atraumatic.  Nose: Nose normal.  Eyes: Right eye exhibits no discharge. Left eye exhibits no discharge.  Neck: Normal range of motion. Neck supple.  Cardiovascular: Normal rate.   No murmur  heard. Pulmonary/Chest: Effort normal and breath sounds normal.  Abdominal: Soft. Bowel sounds are normal. There is no tenderness.  Musculoskeletal: She exhibits no edema.  Neurological: She is alert and oriented to person, place, and time.  Skin: Skin is warm and dry.  Psychiatric: She has a normal mood and affect.  Nursing note and vitals reviewed.   BP 140/62 (BP Location: Left Arm, Patient Position: Sitting, Cuff Size: Normal)   Pulse 77   Temp 97.7 F (36.5 C) (Oral)   Resp 18   Wt 106 lb (48.1 kg)   SpO2 92%   BMI 20.03 kg/m  Wt Readings from Last 3 Encounters:  10/27/16 106 lb (48.1 kg)  10/11/16 107 lb 6.4 oz (48.7 kg)  06/23/16 109 lb 9.6 oz (49.7 kg)   BP Readings from Last 3 Encounters:  10/27/16 140/62  10/11/16 (!) 148/70  06/23/16 116/62     Immunization History  Administered Date(s) Administered  . Pneumococcal Conjugate-13 06/11/2015  . Pneumococcal Polysaccharide-23 05/19/2008  . Td 10/07/2003    Health Maintenance  Topic Date Due  . FOOT EXAM  12/28/1935  . OPHTHALMOLOGY EXAM  12/28/1935  . URINE MICROALBUMIN  12/28/1935  . TETANUS/TDAP  10/06/2013  . INFLUENZA VACCINE  03/13/2017 (Originally 10/26/2016)  . HEMOGLOBIN A1C  12/24/2016  . DEXA SCAN  Completed  . PNA vac Low Risk Adult  Completed    Lab Results  Component Value Date   WBC 7.7 10/27/2016   HGB 13.7 10/27/2016   HCT 41.1 10/27/2016   PLT 292.0 10/27/2016   GLUCOSE 80 10/27/2016   CHOL 192 10/27/2016   TRIG 62.0 10/27/2016   HDL 75.10 10/27/2016   LDLDIRECT 127.9 10/05/2011   LDLCALC 105 (H) 10/27/2016   ALT 11 10/27/2016   AST 20 10/27/2016   NA 137 10/27/2016   K 4.1 10/27/2016   CL 100 10/27/2016   CREATININE 0.76 10/27/2016   BUN 18 10/27/2016   CO2 29 10/27/2016   TSH 4.36 10/27/2016   INR 1.27 09/27/2014   HGBA1C 5.3 06/23/2016    Lab Results  Component Value Date   TSH 4.36 10/27/2016   Lab Results  Component Value Date   WBC 7.7 10/27/2016   HGB 13.7  10/27/2016   HCT 41.1 10/27/2016   MCV 92.0 10/27/2016   PLT 292.0 10/27/2016   Lab Results  Component Value Date   NA 137 10/27/2016   K 4.1 10/27/2016   CO2 29 10/27/2016   GLUCOSE 80 10/27/2016   BUN 18 10/27/2016   CREATININE 0.76 10/27/2016   BILITOT 0.5 10/27/2016   ALKPHOS 65 10/27/2016   AST 20 10/27/2016   ALT 11 10/27/2016   PROT 7.9 10/27/2016   ALBUMIN 4.3 10/27/2016   CALCIUM 9.7 10/27/2016   ANIONGAP 8 03/13/2016   GFR 75.85 10/27/2016   Lab Results  Component Value Date   CHOL 192 10/27/2016   Lab Results  Component Value Date   HDL 75.10 10/27/2016   Lab Results  Component Value Date   LDLCALC 105 (H) 10/27/2016   Lab Results  Component Value Date   TRIG 62.0 10/27/2016   Lab Results  Component Value Date   CHOLHDL 3 10/27/2016   Lab Results  Component Value Date   HGBA1C 5.3 06/23/2016         Assessment & Plan:   Problem List Items Addressed This Visit    Essential hypertension (Chronic)    Well controlled, no changes to meds. Encouraged heart healthy diet such as the DASH diet and exercise as tolerated.       Relevant Medications   metoprolol succinate (TOPROL-XL) 25 MG 24 hr tablet   flecainide (TAMBOCOR) 50 MG tablet   diltiazem (CARDIZEM CD) 240 MG 24 hr capsule   furosemide (LASIX) 20 MG tablet   Other Relevant Orders   CBC (Completed)   TSH (Completed)   Comprehensive metabolic panel (Completed)   Vitamin D deficiency    Encouraged daily vitamin D level      Osteoporosis    Bone density shows osteoporosis. Recommend calcium intake of 1200 to 1500 mg daily, divided  into roughly 3 doses. Best source is the diet and a single dairy serving is about 500 mg, a supplement of calcium citrate once or twice daily to balance diet is fine if not getting enough in diet. Also need Vitamin D 2000 IU caps, 1 cap daily if not already taking vitamin D. Also recommend weight baring exercise on hips and upper body to keep bones strong        Hyperlipidemia   Relevant Medications   metoprolol succinate (TOPROL-XL) 25 MG 24 hr tablet   flecainide (TAMBOCOR) 50 MG tablet   diltiazem (CARDIZEM CD) 240 MG 24 hr capsule   furosemide (LASIX) 20 MG tablet   Other Relevant Orders   Lipid panel (Completed)   Back pain    Encouraged moist heat and gentle stretching as tolerated. May try NSAIDs and prescription meds as directed and report if symptoms worsen or seek immediate care, urinalysis obtained and is unremarkable      Atrial fibrillation ([place])    Rate controlled on Flecainide.       Relevant Medications   metoprolol succinate (TOPROL-XL) 25 MG 24 hr tablet   flecainide (TAMBOCOR) 50 MG tablet   diltiazem (CARDIZEM CD) 240 MG 24 hr capsule   furosemide (LASIX) 20 MG tablet   Hyperglycemia     minimize simple carbs. Increase exercise as tolerated.        Other Visit Diagnoses    Urinary tract infection without hematuria, site unspecified    -  Primary   Relevant Orders   Urinalysis (Completed)   Urine Culture (Completed)   Rhonchi at right lung base       Relevant Orders   DG Chest 2 View (Completed)      I have changed Beth [name]'s metoprolol succinate and flecainide. I am also having her start on albuterol. Additionally, I am having her maintain her multivitamin, fish oil-omega-3 fatty acids, cetirizine, PROBIOTIC ADVANCED, aspirin, omeprazole, NUTRITIONAL SUPPLEMENT, diltiazem, potassium chloride, and furosemide.  Meds ordered this encounter  Medications  . metoprolol succinate (TOPROL-XL) 25 MG 24 hr tablet    Sig: Take 1 tablet (25 mg total) by mouth daily.    Dispense:  90 tablet    Refill:  1  . flecainide (TAMBOCOR) 50 MG tablet    Sig: Take 1 tablet (50 mg total) by mouth every 12 (twelve) hours.    Dispense:  60 tablet    Refill:  1  . diltiazem (CARDIZEM CD) 240 MG 24 hr capsule    Sig: TAKE 1 CAPSULE (240 MG TOTAL) BY MOUTH DAILY.    Dispense:  90 capsule    Refill:  1  . DISCONTD:  furosemide (LASIX) 20 MG tablet    Sig: TAKE 1 TABLET (20 MG TOTAL) BY MOUTH DAILY AS NEEDED FOR EDEMA.    Dispense:  20 tablet    Refill:  5  . potassium chloride (K-DUR,KLOR-CON) 10 MEQ tablet    Sig: Take 1 tablet (10 mEq total) by mouth daily as needed (for when you take Lasix).    Dispense:  30 tablet    Refill:  5  . furosemide (LASIX) 20 MG tablet    Sig: TAKE 1 TABLET (20 MG TOTAL) BY MOUTH DAILY AS NEEDED FOR EDEMA.    Dispense:  49 tablet    Refill:  5  . albuterol (PROVENTIL HFA;VENTOLIN HFA) 108 (90 Base) MCG/ACT inhaler    Sig: Inhale 2 puffs into the lungs every 6 (six) hours as needed for wheezing  or shortness of breath.    Dispense:  1 Inhaler    Refill:  0    CMA served as scribe during this visit. History, Physical and Plan performed by medical provider. Documentation and orders reviewed and attested to.  Penni Homans, MD

## 2016-10-27 NOTE — Patient Instructions (Signed)
Tylenol ES 500 mg by mouth twice daily Lidocaine patch to affected area.  Back Pain, Adult Back pain is very common in adults.The cause of back pain is rarely dangerous and the pain often gets better over time.The cause of your back pain may not be known. Some common causes of back pain include:  Strain of the muscles or ligaments supporting the spine.  Wear and tear (degeneration) of the spinal disks.  Arthritis.  Direct injury to the back.  For many people, back pain may return. Since back pain is rarely dangerous, most people can learn to manage this condition on their own. Follow these instructions at home: Watch your back pain for any changes. The following actions may help to lessen any discomfort you are feeling:  Remain active. It is stressful on your back to sit or stand in one place for long periods of time. Do not sit, drive, or stand in one place for more than 30 minutes at a time. Take short walks on even surfaces as soon as you are able.Try to increase the length of time you walk each day.  Exercise regularly as directed by your health care provider. Exercise helps your back heal faster. It also helps avoid future injury by keeping your muscles strong and flexible.  Do not stay in bed.Resting more than 1-2 days can delay your recovery.  Pay attention to your body when you bend and lift. The most comfortable positions are those that put less stress on your recovering back. Always use proper lifting techniques, including: ? Bending your knees. ? Keeping the load close to your body. ? Avoiding twisting.  Find a comfortable position to sleep. Use a firm mattress and lie on your side with your knees slightly bent. If you lie on your back, put a pillow under your knees.  Avoid feeling anxious or stressed.Stress increases muscle tension and can worsen back pain.It is important to recognize when you are anxious or stressed and learn ways to manage it, such as with  exercise.  Take medicines only as directed by your health care provider. Over-the-counter medicines to reduce pain and inflammation are often the most helpful.Your health care provider may prescribe muscle relaxant drugs.These medicines help dull your pain so you can more quickly return to your normal activities and healthy exercise.  Apply ice to the injured area: ? Put ice in a plastic bag. ? Place a towel between your skin and the bag. ? Leave the ice on for 20 minutes, 2-3 times a day for the first 2-3 days. After that, ice and heat may be alternated to reduce pain and spasms.  Maintain a healthy weight. Excess weight puts extra stress on your back and makes it difficult to maintain good posture.  Contact a health care provider if:  You have pain that is not relieved with rest or medicine.  You have increasing pain going down into the legs or buttocks.  You have pain that does not improve in one week.  You have night pain.  You lose weight.  You have a fever or chills. Get help right away if:  You develop new bowel or bladder control problems.  You have unusual weakness or numbness in your arms or legs.  You develop nausea or vomiting.  You develop abdominal pain.  You feel faint. This information is not intended to replace advice given to you by your health care provider. Make sure you discuss any questions you have with your health care provider.  Document Released: 03/14/2005 Document Revised: 07/23/2015 Document Reviewed: 07/16/2013 Elsevier Interactive Patient Education  2017 [employer].

## 2016-10-27 NOTE — Assessment & Plan Note (Addendum)
Bone density shows osteoporosis. Recommend calcium intake of 1200 to 1500 mg daily, divided into roughly 3 doses. Best source is the diet and a single dairy serving is about 500 mg, a supplement of calcium citrate once or twice daily to balance diet is fine if not getting enough in diet. Also need Vitamin D 2000 IU caps, 1 cap daily if not already taking vitamin D. Also recommend weight baring exercise on hips and upper body to keep bones strong

## 2016-10-28 LAB — CBC
HEMATOCRIT: 41.1 % (ref 36.0–46.0)
HEMOGLOBIN: 13.7 g/dL (ref 12.0–15.0)
MCHC: 33.3 g/dL (ref 30.0–36.0)
MCV: 92 fl (ref 78.0–100.0)
Platelets: 292 10*3/uL (ref 150.0–400.0)
RBC: 4.47 Mil/uL (ref 3.87–5.11)
RDW: 13.8 % (ref 11.5–15.5)
WBC: 7.7 10*3/uL (ref 4.0–10.5)

## 2016-10-28 LAB — COMPREHENSIVE METABOLIC PANEL
ALT: 11 U/L (ref 0–35)
AST: 20 U/L (ref 0–37)
Albumin: 4.3 g/dL (ref 3.5–5.2)
Alkaline Phosphatase: 65 U/L (ref 39–117)
BILIRUBIN TOTAL: 0.5 mg/dL (ref 0.2–1.2)
BUN: 18 mg/dL (ref 6–23)
CHLORIDE: 100 meq/L (ref 96–112)
CO2: 29 meq/L (ref 19–32)
Calcium: 9.7 mg/dL (ref 8.4–10.5)
Creatinine, Ser: 0.76 mg/dL (ref 0.40–1.20)
GFR: 75.85 mL/min (ref >60.00)
GLUCOSE: 80 mg/dL (ref 70–99)
Potassium: 4.1 mEq/L (ref 3.5–5.1)
Sodium: 137 mEq/L (ref 135–145)
Total Protein: 7.9 g/dL (ref 6.0–8.3)

## 2016-10-28 LAB — URINALYSIS
Bilirubin Urine: NEGATIVE
KETONES UR: NEGATIVE
LEUKOCYTES UA: NEGATIVE
Nitrite: NEGATIVE
PH: 6 (ref 5.0–8.0)
Total Protein, Urine: NEGATIVE
Urine Glucose: NEGATIVE
Urobilinogen, UA: 0.2 (ref 0.0–1.0)

## 2016-10-28 LAB — LIPID PANEL
Cholesterol: 192 mg/dL (ref 0–200)
HDL: 75.1 mg/dL (ref >39.00)
LDL Cholesterol: 105 mg/dL — ABNORMAL HIGH (ref 0–99)
NONHDL: 117.03
Total CHOL/HDL Ratio: 3
Triglycerides: 62 mg/dL (ref 0.0–149.0)
VLDL: 12.4 mg/dL (ref 0.0–40.0)

## 2016-10-28 LAB — URINE CULTURE: Organism ID, Bacteria: NO GROWTH

## 2016-10-28 LAB — TSH: TSH: 4.36 u[IU]/mL (ref 0.35–4.50)

## 2016-10-30 NOTE — Assessment & Plan Note (Signed)
Encouraged moist heat and gentle stretching as tolerated. May try NSAIDs and prescription meds as directed and report if symptoms worsen or seek immediate care, urinalysis obtained and is unremarkable

## 2016-10-30 NOTE — Assessment & Plan Note (Signed)
Rate controlled on Flecainide.

## 2016-10-30 NOTE — Assessment & Plan Note (Signed)
minimize simple carbs. Increase exercise as tolerated.  

## 2016-10-31 ENCOUNTER — Telehealth: Payer: Self-pay | Admitting: Pulmonary Disease

## 2016-10-31 DIAGNOSIS — H40033 Anatomical narrow angle, bilateral: Secondary | ICD-10-CM | POA: Diagnosis not present

## 2016-10-31 DIAGNOSIS — H2513 Age-related nuclear cataract, bilateral: Secondary | ICD-10-CM | POA: Diagnosis not present

## 2016-10-31 DIAGNOSIS — H40013 Open angle with borderline findings, low risk, bilateral: Secondary | ICD-10-CM | POA: Diagnosis not present

## 2016-10-31 DIAGNOSIS — H40053 Ocular hypertension, bilateral: Secondary | ICD-10-CM | POA: Diagnosis not present

## 2016-10-31 NOTE — Telephone Encounter (Signed)
lmtcb X1 for [employer].

## 2016-11-01 NOTE — Telephone Encounter (Signed)
Called and spoke with Janice--pts daughter---she has not been seen by PM for several months.  Dr. Randel Pigg did a cxr and the pts daughter wanted to see if PM would review this cxr.  She stated that the nodules have grown based on the cxr.  They want the pt to have the CT scan and the daughter wanted to see what PM thinks.  PM please advise. Thanks   pts daughter stated that her cell number is not taking the VM so her office number is  [phone] and she will be there until 5.

## 2016-11-01 NOTE — Telephone Encounter (Signed)
I took a look at the CXR. The lung nodules look more prominent but it is hard to determine on CXR alone if they are bigger. I agree with getting the CT scan if they are agreeable. Make follow up in clinic as well.  Thanks Marshell Garfinkel MD [facility] 11/01/2016, 10:53 AM

## 2016-11-01 NOTE — Telephone Encounter (Signed)
I have spoken with pt's daughter, Thayer Headings. Thayer Headings has been made aware of PM's below message and recommendations. Thayer Headings states she is currently in a meeting, and will call back to schedule an appointment with PM.

## 2016-11-03 ENCOUNTER — Encounter: Payer: Self-pay | Admitting: Family Medicine

## 2016-11-03 NOTE — Progress Notes (Signed)
[facility]    See chart for results

## 2016-11-04 NOTE — Telephone Encounter (Signed)
lmtcb x2 for pt's daughter. 

## 2016-11-07 NOTE — Telephone Encounter (Signed)
lmtcb x3 for pt's daughter. 

## 2016-11-08 NOTE — Telephone Encounter (Signed)
We have attempted to contact pt's daughter several times with no call back. Per triage protocol, message will be closed.

## 2016-11-24 ENCOUNTER — Other Ambulatory Visit: Payer: Self-pay | Admitting: Family Medicine

## 2016-11-24 NOTE — Telephone Encounter (Signed)
Faxed Flecainide for 30d+2 till OV/thx dmf

## 2016-11-30 ENCOUNTER — Encounter: Payer: Self-pay | Admitting: Pulmonary Disease

## 2016-11-30 ENCOUNTER — Ambulatory Visit (INDEPENDENT_AMBULATORY_CARE_PROVIDER_SITE_OTHER): Payer: Medicare Other | Admitting: Pulmonary Disease

## 2016-11-30 VITALS — BP 122/72 | HR 65 | Ht 61.0 in | Wt 104.2 lb

## 2016-11-30 DIAGNOSIS — R911 Solitary pulmonary nodule: Secondary | ICD-10-CM

## 2016-11-30 DIAGNOSIS — J90 Pleural effusion, not elsewhere classified: Secondary | ICD-10-CM | POA: Diagnosis not present

## 2016-11-30 NOTE — Patient Instructions (Addendum)
Follow-up in 6 months after cxr Please let us know if is any worsening of dyspnea or low oxygen sats or worsening dyspnea

## 2016-11-30 NOTE — Progress Notes (Signed)
Subjective:    Patient ID: Beth [name], female    DOB: 1923/06/08, 83 y.o.   MRN: [id number]  PROBLEM LIST: CHF Right pleural effusion- Exudative lymphocyte predominant. Pulmonary nodules  HPI [name] is here for follow-up of pleural effusion. She has a known history of pulmonary nodules dating back to 2007 but has declined further workup or biopsy. She also has history of atrial fibrillation, pacemaker placement. She was admitted on 01/06/15 with lower extremity edema, fatigue, dyspnea. This was thought to be secondary to heart failure. She was also noted to have a unilateral moderate right pleural effusion. Thoracentesis showed an exudative lymphocyte predominant fluid. >1Lt removed. All cultures and cytology and autoimmune workup has been negative so far.  Since discharge he reports feeling well with no shortness of breath, chest pain, palpitations. She's been taking her Lasix daily. She does not note any lower extremity edema. She's been measuring her weights daily and they have been constant between 100-105 pounds.  Interim history: She's had a chest x-ray last month which showed possible increase in her chronic pulmonary nodules. A CT scan was ordered but patient wants to discuss this further before proceeding with the scan His respiratory symptoms are unchanged.  DATA: CXR 01/07/15 No pneumothorax following thoracentesis with removal of most of the effusion from the right side. Small residual effusion remains on the right. Multiple nodular lesions remain, stable. No new opacity. Underlying emphysema. Pacemaker leads attached to right heart.  CXR 07/29/15 Continued improvement of right effusion.   CXR 10/27/16 Hyperinflation, calcified pulmonary nodules which appears slightly more prominent The effusion has not returned. I reviewed her images personally.  CT chest 01/06/15 1. Numerous progressive partially calcified lung nodules. Differential diagnostic possibilities  include treated as well as untreated various metastatic diseases. TB, histoplasmosis, and pulmonary amyloidosis are other considerations, as would be silicosis or other occupational lung diseases. There is no calcified adenopathy as would be expected with TB, histoplasmosis, or occupational exposure. 2. Right pleural effusion. 3. Acute to subacute T 11 compression deformity, with mild canal Narrowing.  Echo 01/09/15 LVEF 40-45% with basal and mid inferior and inferolateral hypokinesis. Moderate LVH. Grade 1 diastolic dysfunction. PASP 47 mm.  Pleural fluid 01/07/15 WBC 2032, 78% lymphs, LDH 133, total protein 5, pH 8. Cultures-no growth to date Cytology-reactive mesothelial cells, no malignancy.  01/09/15 ANA, CRP, RF, CCP, double-stranded DNA, Smith ab, SSA, SSB, coccidiosis antibody all negative Sedimentation rate 53.  Past Medical History:  Diagnosis Date  . Abdominal pain, unspecified site 08/17/2012  . Acute on chronic combined systolic and diastolic heart failure ([place])   . Allergic state 06/22/2013  . Allergy   . Arthritis   . Atrial fibrillation with rapid ventricular response ([place])   . COPD (chronic obstructive pulmonary disease) ([place])   . Dyspnea   . Glaucoma 07/06/2014  . Hyperglycemia 02/01/2016  . Hypertension   . Hypokalemia 02/2016   Due to Lasix  . Increased urinary frequency 01/02/2014  . Left ankle sprain   . Loss of weight 01/19/2013  . Lung nodule    CT of chest 2007-left loewr lobar mass- evaluated by pulmonary- patient refused further work up  . Osteoporosis   . Other and unspecified hyperlipidemia 08/17/2012  . Pulmonary HTN ([place])   . Syncope    fall  . Systolic CHF ([place])    EF 09-32% 09/2014  . Tobacco abuse 02/2016   Quit 2 years ago  . Vertigo     Current Outpatient Prescriptions:  .  albuterol (PROVENTIL HFA;VENTOLIN HFA) 108 (90 Base) MCG/ACT inhaler, Inhale 2 puffs into the lungs every 6 (six) hours as needed for wheezing or shortness of  breath., Disp: 1 Inhaler, Rfl: 0 .  aspirin 81 MG chewable tablet, Chew 81 mg by mouth daily. , Disp: , Rfl:  .  cetirizine (ZYRTEC) 10 MG tablet, Take 10 mg by mouth every morning., Disp: , Rfl:  .  diltiazem (CARDIZEM CD) 240 MG 24 hr capsule, TAKE 1 CAPSULE (240 MG TOTAL) BY MOUTH DAILY., Disp: 90 capsule, Rfl: 1 .  fish oil-omega-3 fatty acids 1000 MG capsule, Take 1 g by mouth daily at 12 noon. , Disp: , Rfl:  .  flecainide (TAMBOCOR) 50 MG tablet, TAKE 1 TABLET BY MOUTH EVERY 12 HOURS, Disp: 60 tablet, Rfl: 2 .  furosemide (LASIX) 20 MG tablet, TAKE 1 TABLET (20 MG TOTAL) BY MOUTH DAILY AS NEEDED FOR EDEMA., Disp: 49 tablet, Rfl: 5 .  metoprolol succinate (TOPROL-XL) 25 MG 24 hr tablet, Take 1 tablet (25 mg total) by mouth daily., Disp: 90 tablet, Rfl: 1 .  Multiple Vitamin (MULTIVITAMIN) tablet, Take 1 tablet by mouth daily at 12 noon. , Disp: , Rfl:  .  NUTRITIONAL SUPPLEMENT LIQD, Take 120 mLs by mouth daily. MedPass, Disp: , Rfl:  .  omeprazole (PRILOSEC) 20 MG capsule, Take 20 mg by mouth daily. , Disp: , Rfl:  .  potassium chloride (K-DUR,KLOR-CON) 10 MEQ tablet, Take 1 tablet (10 mEq total) by mouth daily as needed (for when you take Lasix)., Disp: 30 tablet, Rfl: 5 .  Probiotic Product (PROBIOTIC ADVANCED) CAPS, Take 2 capsules by mouth daily. Florastor 250 mg, Disp: , Rfl:   Review of Systems Denies any cough, sputum production, dyspnea, wheezing. Denies any chest pain, palpitations. Denies any fevers, chills, loss of weight, loss of appetite, malaise, fatigue. Denies any nausea, vomiting, diarrhea, constipation. All other review of systems negative.    Objective:   Physical Exam Blood pressure 122/72, pulse 65, height 5\' 1"  (1.549 m), weight 104 lb 4 oz (47.3 kg), SpO2 93 %. Gen:      No acute distress HEENT:  EOMI, sclera anicteric Neck:     No masses; no thyromegaly Lungs:    Clear to auscultation bilaterally; normal respiratory effort CV:         Regular rate and  rhythm; no murmurs Abd:      + bowel sounds; soft, non-tender; no palpable masses, no distension Ext:    No edema; adequate peripheral perfusion Skin:      Warm and dry; no rash Neuro: alert and oriented x 3 Psych: normal mood and affect    Assessment & Plan:  Rt Pleural effusion, pulmonary nodules s/p hospitalization with right lymphocytic predominant exudative effusion of unclear etiology. Lymphocytic effusions are most commonly due to malignancy or TB. She has a long history of bilateral parenchymal nodules the last CT scan shows that they're slightly larger. However the pleural fluid cytology is negative and she is not interested in further workup, biopsy. She does not have any risk factors for TB. Autoimmune workup, cultures are negative.    The daughter reports that she fell and hit the right side of her chest about a month before the effusion was discovered. I believe the effusion is due to pulmonary contusion , inflammation. There is no recurrence of effusion on repeat CXR. We will follow up in 6 months.   Pulmonary nodules She has chronic pulmonary nodules which may be secondary to  prior inflammatory process such as sarcoid or old granulomatous infection. Her last chest x-ray shows the nodules are slightly more prominent. I had a long discussion with the patient and her daughter about doing a CT scan of the chest which is reasonable. However both of them wish to avoid any excessive radiation. Even if the nodules are definitely bigger and we suspect malignancy they would not want any workup or treatment  Plan: - Defer CT scan of chest - Conservative management of lung nodules.  - Follow up in 6 months with CXR  More then 1/2 the time of the 40 min visit was spent in counseling and/or coordination of care with the patient and family.  Marshell Garfinkel MD [facility] Pager [phone] If no answer or after 3pm call: [phone] 11/30/2016, 2:44 PM

## 2017-01-10 ENCOUNTER — Ambulatory Visit (INDEPENDENT_AMBULATORY_CARE_PROVIDER_SITE_OTHER): Payer: Medicare Other | Admitting: *Deleted

## 2017-01-10 ENCOUNTER — Telehealth: Payer: Self-pay | Admitting: Cardiology

## 2017-01-10 DIAGNOSIS — I442 Atrioventricular block, complete: Secondary | ICD-10-CM

## 2017-01-10 NOTE — Progress Notes (Signed)
Remote pacemaker transmission.   

## 2017-01-10 NOTE — Telephone Encounter (Signed)
Spoke with pt and reminded pt of remote transmission that is due today. Pt verbalized understanding.   

## 2017-01-12 LAB — CUP PACEART REMOTE DEVICE CHECK
Brady Statistic AP VS Percent: 94 %
Brady Statistic AS VP Percent: 0 %
Implantable Lead Implant Date: 20160701
Implantable Lead Implant Date: 20160701
Implantable Lead Location: 753860
Implantable Lead Model: 5076
Implantable Pulse Generator Implant Date: 20160701
Lead Channel Impedance Value: 469 Ohm
Lead Channel Pacing Threshold Amplitude: 0.625 V
Lead Channel Pacing Threshold Amplitude: 0.625 V
Lead Channel Pacing Threshold Pulse Width: 0.4 ms
Lead Channel Pacing Threshold Pulse Width: 0.4 ms
MDC IDC LEAD LOCATION: 753859
MDC IDC MSMT BATTERY IMPEDANCE: 135 Ohm
MDC IDC MSMT BATTERY REMAINING LONGEVITY: 139 mo
MDC IDC MSMT BATTERY VOLTAGE: 2.79 V
MDC IDC MSMT LEADCHNL RA IMPEDANCE VALUE: 435 Ohm
MDC IDC SESS DTM: 20181013213629
MDC IDC SET LEADCHNL RA PACING AMPLITUDE: 1.5 V
MDC IDC SET LEADCHNL RV PACING AMPLITUDE: 2.5 V
MDC IDC SET LEADCHNL RV PACING PULSEWIDTH: 0.4 ms
MDC IDC SET LEADCHNL RV SENSING SENSITIVITY: 4 mV
MDC IDC STAT BRADY AP VP PERCENT: 0 %
MDC IDC STAT BRADY AS VS PERCENT: 5 %

## 2017-01-13 ENCOUNTER — Encounter: Payer: Self-pay | Admitting: Cardiology

## 2017-01-23 DIAGNOSIS — H40013 Open angle with borderline findings, low risk, bilateral: Secondary | ICD-10-CM | POA: Diagnosis not present

## 2017-01-23 DIAGNOSIS — H25011 Cortical age-related cataract, right eye: Secondary | ICD-10-CM | POA: Diagnosis not present

## 2017-01-23 DIAGNOSIS — H35363 Drusen (degenerative) of macula, bilateral: Secondary | ICD-10-CM | POA: Diagnosis not present

## 2017-01-23 DIAGNOSIS — H2513 Age-related nuclear cataract, bilateral: Secondary | ICD-10-CM | POA: Diagnosis not present

## 2017-01-23 DIAGNOSIS — H2511 Age-related nuclear cataract, right eye: Secondary | ICD-10-CM | POA: Diagnosis not present

## 2017-01-23 DIAGNOSIS — H35033 Hypertensive retinopathy, bilateral: Secondary | ICD-10-CM | POA: Diagnosis not present

## 2017-01-23 DIAGNOSIS — H25013 Cortical age-related cataract, bilateral: Secondary | ICD-10-CM | POA: Diagnosis not present

## 2017-01-26 ENCOUNTER — Ambulatory Visit: Payer: Self-pay | Admitting: Family Medicine

## 2017-01-26 ENCOUNTER — Telehealth: Payer: Self-pay

## 2017-01-26 NOTE — Telephone Encounter (Signed)
    Chart reviewed as part of pre-operative protocol coverage.Beth [name] was last seen on Dr. Lovena Le in 09/2016. Requested clearance is for cataract removal and therefore, based on ACC/AHA guidelines and office protocol, the patient would be at acceptable risk for the planned procedure without further cardiovascular testing. This note will be routed via [facility] fax function to requested party.  Charlie Pitter, PA-C 01/26/2017, 1:07 PM

## 2017-01-26 NOTE — Telephone Encounter (Signed)
   [facility] Pre-operative Risk Assessment    Request for surgical clearance:  1. What type of surgery is being performed? Cataract removal with intraocular lens placement  2. When is this surgery scheduled? 02/28/17  3. Are there any medications that need to be held prior to surgery and how long? None listed   4. Practice name and name of physician performing surgery?  1. [facility] 2. Dr Monna Fam   5. What is your office phone and fax number?  1. Phone: [phone] 2. Fax: [phone] ATTN: Surgery Department   6. Anesthesia type (None, local, MAC, general) ? Local Anesthesia

## 2017-01-31 ENCOUNTER — Ambulatory Visit: Payer: Self-pay | Admitting: Family Medicine

## 2017-02-01 ENCOUNTER — Encounter: Payer: Self-pay | Admitting: Family Medicine

## 2017-02-13 DIAGNOSIS — B353 Tinea pedis: Secondary | ICD-10-CM | POA: Diagnosis not present

## 2017-02-13 DIAGNOSIS — B351 Tinea unguium: Secondary | ICD-10-CM | POA: Diagnosis not present

## 2017-02-13 DIAGNOSIS — L814 Other melanin hyperpigmentation: Secondary | ICD-10-CM | POA: Diagnosis not present

## 2017-02-13 DIAGNOSIS — D692 Other nonthrombocytopenic purpura: Secondary | ICD-10-CM | POA: Diagnosis not present

## 2017-02-13 DIAGNOSIS — Z23 Encounter for immunization: Secondary | ICD-10-CM | POA: Diagnosis not present

## 2017-02-13 DIAGNOSIS — D225 Melanocytic nevi of trunk: Secondary | ICD-10-CM | POA: Diagnosis not present

## 2017-02-13 DIAGNOSIS — L821 Other seborrheic keratosis: Secondary | ICD-10-CM | POA: Diagnosis not present

## 2017-02-13 DIAGNOSIS — I872 Venous insufficiency (chronic) (peripheral): Secondary | ICD-10-CM | POA: Diagnosis not present

## 2017-02-13 DIAGNOSIS — L570 Actinic keratosis: Secondary | ICD-10-CM | POA: Diagnosis not present

## 2017-02-13 DIAGNOSIS — D1801 Hemangioma of skin and subcutaneous tissue: Secondary | ICD-10-CM | POA: Diagnosis not present

## 2017-02-25 HISTORY — PX: EYE SURGERY: SHX253

## 2017-02-28 DIAGNOSIS — H2511 Age-related nuclear cataract, right eye: Secondary | ICD-10-CM | POA: Diagnosis not present

## 2017-02-28 DIAGNOSIS — H25811 Combined forms of age-related cataract, right eye: Secondary | ICD-10-CM | POA: Diagnosis not present

## 2017-03-13 ENCOUNTER — Ambulatory Visit: Payer: Medicare Other | Admitting: Family Medicine

## 2017-04-11 ENCOUNTER — Telehealth: Payer: Self-pay | Admitting: Cardiology

## 2017-04-11 ENCOUNTER — Ambulatory Visit (INDEPENDENT_AMBULATORY_CARE_PROVIDER_SITE_OTHER): Payer: Medicare Other | Admitting: *Deleted

## 2017-04-11 DIAGNOSIS — I442 Atrioventricular block, complete: Secondary | ICD-10-CM

## 2017-04-11 NOTE — Telephone Encounter (Signed)
LMOVM reminding pt to send remote transmission.   

## 2017-04-13 NOTE — Progress Notes (Signed)
Remote pacemaker transmission.   

## 2017-04-14 ENCOUNTER — Encounter: Payer: Self-pay | Admitting: Cardiology

## 2017-04-14 LAB — CUP PACEART REMOTE DEVICE CHECK
Brady Statistic AP VP Percent: 0 %
Brady Statistic AS VS Percent: 7 %
Date Time Interrogation Session: 20190111175034
Implantable Lead Implant Date: 20160701
Implantable Lead Location: 753859
Lead Channel Impedance Value: 455 Ohm
Lead Channel Impedance Value: 488 Ohm
Lead Channel Pacing Threshold Amplitude: 0.625 V
Lead Channel Setting Sensing Sensitivity: 4 mV
MDC IDC LEAD IMPLANT DT: 20160701
MDC IDC LEAD LOCATION: 753860
MDC IDC MSMT BATTERY IMPEDANCE: 135 Ohm
MDC IDC MSMT BATTERY REMAINING LONGEVITY: 140 mo
MDC IDC MSMT BATTERY VOLTAGE: 2.79 V
MDC IDC MSMT LEADCHNL RA PACING THRESHOLD AMPLITUDE: 0.625 V
MDC IDC MSMT LEADCHNL RA PACING THRESHOLD PULSEWIDTH: 0.4 ms
MDC IDC MSMT LEADCHNL RV PACING THRESHOLD PULSEWIDTH: 0.4 ms
MDC IDC PG IMPLANT DT: 20160701
MDC IDC SET LEADCHNL RA PACING AMPLITUDE: 1.5 V
MDC IDC SET LEADCHNL RV PACING AMPLITUDE: 2.5 V
MDC IDC SET LEADCHNL RV PACING PULSEWIDTH: 0.4 ms
MDC IDC STAT BRADY AP VS PERCENT: 93 %
MDC IDC STAT BRADY AS VP PERCENT: 0 %

## 2017-04-27 ENCOUNTER — Other Ambulatory Visit: Payer: Self-pay | Admitting: Family Medicine

## 2017-05-01 DIAGNOSIS — H2512 Age-related nuclear cataract, left eye: Secondary | ICD-10-CM | POA: Diagnosis not present

## 2017-05-29 ENCOUNTER — Ambulatory Visit (INDEPENDENT_AMBULATORY_CARE_PROVIDER_SITE_OTHER): Payer: Medicare Other | Admitting: Family Medicine

## 2017-05-29 ENCOUNTER — Encounter: Payer: Self-pay | Admitting: Family Medicine

## 2017-05-29 DIAGNOSIS — E782 Mixed hyperlipidemia: Secondary | ICD-10-CM | POA: Diagnosis not present

## 2017-05-29 DIAGNOSIS — E559 Vitamin D deficiency, unspecified: Secondary | ICD-10-CM | POA: Diagnosis not present

## 2017-05-29 DIAGNOSIS — R739 Hyperglycemia, unspecified: Secondary | ICD-10-CM

## 2017-05-29 DIAGNOSIS — I509 Heart failure, unspecified: Secondary | ICD-10-CM | POA: Diagnosis not present

## 2017-05-29 DIAGNOSIS — I4891 Unspecified atrial fibrillation: Secondary | ICD-10-CM

## 2017-05-29 DIAGNOSIS — I10 Essential (primary) hypertension: Secondary | ICD-10-CM | POA: Diagnosis not present

## 2017-05-29 MED ORDER — POTASSIUM CHLORIDE CRYS ER 10 MEQ PO TBCR: 10.0000 meq | EXTENDED_RELEASE_TABLET | Freq: Every day | ORAL | 5 refills | Status: DC | PRN

## 2017-05-29 MED ORDER — METOPROLOL SUCCINATE ER 25 MG PO TB24: 25.0000 mg | ORAL_TABLET | Freq: Every day | ORAL | 1 refills | Status: DC

## 2017-05-29 MED ORDER — DILTIAZEM HCL ER COATED BEADS 240 MG PO CP24: ORAL_CAPSULE | ORAL | 1 refills | Status: DC

## 2017-05-29 MED ORDER — FUROSEMIDE 20 MG PO TABS: ORAL_TABLET | ORAL | 5 refills | Status: DC

## 2017-05-29 NOTE — Assessment & Plan Note (Signed)
No recent exacerbations.  

## 2017-05-29 NOTE — Patient Instructions (Signed)

## 2017-05-29 NOTE — Assessment & Plan Note (Signed)
hgba1c acceptable, minimize simple carbs. Increase exercise as tolerated.  

## 2017-05-29 NOTE — Assessment & Plan Note (Signed)
Well controlled, no changes to meds. Encouraged heart healthy diet such as the DASH diet and exercise as tolerated.  °

## 2017-05-29 NOTE — Assessment & Plan Note (Signed)
Rate controlled and asymptomatic on current meds

## 2017-05-29 NOTE — Assessment & Plan Note (Signed)
Check level 

## 2017-05-29 NOTE — Progress Notes (Signed)
Subjective:  I acted as a Education administrator for Dr. Charlett Blake. [name], [place]  Patient ID: Beth [name], female    DOB: 1923-08-30, 84 y.o.   MRN: [id number]  No chief complaint on file.   HPI  Patient is in today for a 4 month follow up accompanied by her daughter. She is doing very well today. No recent febrile illness or hospitalizations. No polyuria or polydipsia. No flares in dyspnea. Does have a repeat CXR in the near future to reevaluate her pulmonary nodules. No new cough or respiratory symptoms. Denies CP/palp/SOB/HA/congestion/fevers/GI or GU c/o. Taking meds as prescribed  Patient Care Team: Mosie Lukes, MD as PCP - General (Family Medicine)   Past Medical History:  Diagnosis Date  . Abdominal pain, unspecified site 08/17/2012  . Acute on chronic combined systolic and diastolic heart failure ([place])   . Allergic state 06/22/2013  . Allergy   . Arthritis   . Atrial fibrillation with rapid ventricular response ([place])   . COPD (chronic obstructive pulmonary disease) ([place])   . Dyspnea   . Glaucoma 07/06/2014  . Hyperglycemia 02/01/2016  . Hypertension   . Hypokalemia 02/2016   Due to Lasix  . Increased urinary frequency 01/02/2014  . Left ankle sprain   . Loss of weight 01/19/2013  . Lung nodule    CT of chest 2007-left loewr lobar mass- evaluated by pulmonary- patient refused further work up  . Osteoporosis   . Other and unspecified hyperlipidemia 08/17/2012  . Pulmonary HTN ([place])   . Syncope    fall  . Systolic CHF ([place])    EF 23-53% 09/2014  . Tobacco abuse 02/2016   Quit 2 years ago  . Vertigo     Past Surgical History:  Procedure Laterality Date  . ABDOMINAL HYSTERECTOMY    . APPENDECTOMY    . CESAREAN SECTION    . CHOLECYSTECTOMY    . EP IMPLANTABLE DEVICE N/A 09/26/2014   Procedure: Pacemaker Implant;  Surgeon: Evans Lance, MD;  Location: [facility];  Service: Cardiovascular;  Laterality: N/A;  . EYE SURGERY Right 02/2017   cataract  . HEMORRHOID  SURGERY    . HERNIA REPAIR    . THORACENTESIS Right 01/07/15   Exudative Effusion Lymphocyte predominant    Family History  Problem Relation Age of Onset  . Heart disease Mother   . Hypertension Mother   . Diabetes Mother   . Stroke Mother   . Cancer Brother   . Diabetes Unknown   . Cancer Unknown        lung- in distant releatives  . Lupus Brother   . Cholelithiasis Father   . Nephrolithiasis Father     Social History   Socioeconomic History  . Marital status: Widowed    Spouse name: Not on file  . Number of children: Not on file  . Years of education: Not on file  . Highest education level: Not on file  Social Needs  . Financial resource strain: Not on file  . Food insecurity - worry: Not on file  . Food insecurity - inability: Not on file  . Transportation needs - medical: Not on file  . Transportation needs - non-medical: Not on file  Occupational History  . Not on file  Tobacco Use  . Smoking status: Former Smoker    Packs/day: 0.10    Years: 60.00    Pack years: 6.00    Types: Cigarettes  . Smokeless tobacco: Never Used  . Tobacco  comment: patient has stopped smoking on September 25, 2014  Substance and Sexual Activity  . Alcohol use: No    Alcohol/week: 0.0 oz  . Drug use: No  . Sexual activity: No    Birth control/protection: Abstinence  Other Topics Concern  . Not on file  Social History Narrative   [facility]:   Patient is originally from [place]. She is lived in [place],, [place], [place], [place], and [place]. She moved to [place] in 2006. 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 No pet exposure. Remote bird exposure. No mold exposure.    Outpatient Medications Prior to Visit  Medication Sig Dispense Refill  . aspirin 81 MG chewable tablet Chew 81 mg by mouth daily.     . cetirizine (ZYRTEC) 10 MG tablet Take 10 mg by mouth every morning.    . fish oil-omega-3 fatty acids  1000 MG capsule Take 1 g by mouth daily at 12 noon.     . flecainide (TAMBOCOR) 50 MG tablet TAKE 1 TABLET BY MOUTH EVERY 12 HOURS 60 tablet 2  . Multiple Vitamin (MULTIVITAMIN) tablet Take 1 tablet by mouth daily at 12 noon.     . Probiotic Product (PROBIOTIC ADVANCED) CAPS Take 2 capsules by mouth daily. Florastor 250 mg    . diltiazem (CARDIZEM CD) 240 MG 24 hr capsule TAKE 1 CAPSULE (240 MG TOTAL) BY MOUTH DAILY. 90 capsule 1  . furosemide (LASIX) 20 MG tablet TAKE 1 TABLET (20 MG TOTAL) BY MOUTH DAILY AS NEEDED FOR EDEMA. 49 tablet 5  . metoprolol succinate (TOPROL-XL) 25 MG 24 hr tablet Take 1 tablet (25 mg total) by mouth daily. 90 tablet 1  . potassium chloride (K-DUR,KLOR-CON) 10 MEQ tablet Take 1 tablet (10 mEq total) by mouth daily as needed (for when you take Lasix). 30 tablet 5  . albuterol (PROVENTIL HFA;VENTOLIN HFA) 108 (90 Base) MCG/ACT inhaler Inhale 2 puffs into the lungs every 6 (six) hours as needed for wheezing or shortness of breath. 1 Inhaler 0  . NUTRITIONAL SUPPLEMENT LIQD Take 120 mLs by mouth daily. MedPass    . omeprazole (PRILOSEC) 20 MG capsule Take 20 mg by mouth daily.      No facility-administered medications prior to visit.     Allergies  Allergen Reactions  . Penicillins Shortness Of Breath, Diarrhea and Other (See Comments)    Stomach cramping  . Ciprofloxacin Other (See Comments)    myalgias  . Fexofenadine Other (See Comments)    unknown    Review of Systems  Constitutional: Positive for malaise/fatigue. Negative for fever.  HENT: Negative for congestion.   Eyes: Negative for blurred vision.  Respiratory: Negative for shortness of breath.   Cardiovascular: Negative for chest pain, palpitations and leg swelling.  Gastrointestinal: Negative for abdominal pain, blood in stool and nausea.  Genitourinary: Negative for dysuria and frequency.  Musculoskeletal: Negative for falls.  Skin: Negative for rash.  Neurological: Negative for dizziness, loss  of consciousness and headaches.  Endo/Heme/Allergies: Negative for environmental allergies.  Psychiatric/Behavioral: Negative for depression. The patient is not nervous/anxious.        Objective:    Physical Exam  Constitutional: She is oriented to person, place, and time. She appears well-developed and well-nourished. No distress.  HENT:  Head: Normocephalic and atraumatic.  Nose: Nose normal.  Eyes: Right eye exhibits no discharge. Left eye exhibits no discharge.  Neck: Normal range of motion. Neck supple.  Cardiovascular: Normal rate.  Murmur heard. Pulmonary/Chest: Effort normal and breath sounds normal.  Abdominal: Soft. Bowel sounds are normal. There is no tenderness.  Musculoskeletal: She exhibits no edema.  Neurological: She is alert and oriented to person, place, and time.  Skin: Skin is warm and dry.  Psychiatric: She has a normal mood and affect.  Nursing note and vitals reviewed.   BP (!) 144/54 (BP Location: Left Arm, Patient Position: Sitting, Cuff Size: Normal)   Pulse 60   Temp 97.7 F (36.5 C) (Oral)   Resp 18   Wt 102 lb 12.8 oz (46.6 kg)   SpO2 97%   BMI 19.42 kg/m  Wt Readings from Last 3 Encounters:  05/29/17 102 lb 12.8 oz (46.6 kg)  11/30/16 104 lb 4 oz (47.3 kg)  10/27/16 106 lb (48.1 kg)   BP Readings from Last 3 Encounters:  05/29/17 (!) 144/54  11/30/16 122/72  10/27/16 140/62     Immunization History  Administered Date(s) Administered  . Pneumococcal Conjugate-13 06/11/2015  . Pneumococcal Polysaccharide-23 05/19/2008  . Td 10/07/2003    Health Maintenance  Topic Date Due  . FOOT EXAM  12/28/1935  . OPHTHALMOLOGY EXAM  12/28/1935  . URINE MICROALBUMIN  12/28/1935  . TETANUS/TDAP  10/06/2013  . HEMOGLOBIN A1C  12/24/2016  . INFLUENZA VACCINE  06/25/2017 (Originally 10/26/2016)  . DEXA SCAN  Completed  . PNA vac Low Risk Adult  Completed    Lab Results  Component Value Date   WBC 7.7 10/27/2016   HGB 13.7 10/27/2016   HCT  41.1 10/27/2016   PLT 292.0 10/27/2016   GLUCOSE 80 10/27/2016   CHOL 192 10/27/2016   TRIG 62.0 10/27/2016   HDL 75.10 10/27/2016   LDLDIRECT 127.9 10/05/2011   LDLCALC 105 (H) 10/27/2016   ALT 11 10/27/2016   AST 20 10/27/2016   NA 137 10/27/2016   K 4.1 10/27/2016   CL 100 10/27/2016   CREATININE 0.76 10/27/2016   BUN 18 10/27/2016   CO2 29 10/27/2016   TSH 4.36 10/27/2016   INR 1.27 09/27/2014   HGBA1C 5.3 06/23/2016    Lab Results  Component Value Date   TSH 4.36 10/27/2016   Lab Results  Component Value Date   WBC 7.7 10/27/2016   HGB 13.7 10/27/2016   HCT 41.1 10/27/2016   MCV 92.0 10/27/2016   PLT 292.0 10/27/2016   Lab Results  Component Value Date   NA 137 10/27/2016   K 4.1 10/27/2016   CO2 29 10/27/2016   GLUCOSE 80 10/27/2016   BUN 18 10/27/2016   CREATININE 0.76 10/27/2016   BILITOT 0.5 10/27/2016   ALKPHOS 65 10/27/2016   AST 20 10/27/2016   ALT 11 10/27/2016   PROT 7.9 10/27/2016   ALBUMIN 4.3 10/27/2016   CALCIUM 9.7 10/27/2016   ANIONGAP 8 03/13/2016   GFR 75.85 10/27/2016   Lab Results  Component Value Date   CHOL 192 10/27/2016   Lab Results  Component Value Date   HDL 75.10 10/27/2016   Lab Results  Component Value Date   LDLCALC 105 (H) 10/27/2016   Lab Results  Component Value Date   TRIG 62.0 10/27/2016   Lab Results  Component Value Date   CHOLHDL 3 10/27/2016   Lab Results  Component Value Date   HGBA1C 5.3 06/23/2016         Assessment & Plan:   Problem List Items Addressed This Visit    Essential hypertension (Chronic)    Well controlled, no changes to meds.  Encouraged heart healthy diet such as the DASH diet and exercise as tolerated.       Relevant Medications   metoprolol succinate (TOPROL-XL) 25 MG 24 hr tablet   diltiazem (CARDIZEM CD) 240 MG 24 hr capsule   furosemide (LASIX) 20 MG tablet   Other Relevant Orders   CBC   Comprehensive metabolic panel   TSH   Vitamin D deficiency    Check  level      Relevant Orders   VITAMIN D 25 Hydroxy (Vit-D Deficiency, Fractures)   Hyperlipidemia     encouraged heart healthy diet, avoid trans fats, minimize simple carbs and saturated fats. Increase exercise as tolerated      Relevant Medications   metoprolol succinate (TOPROL-XL) 25 MG 24 hr tablet   diltiazem (CARDIZEM CD) 240 MG 24 hr capsule   furosemide (LASIX) 20 MG tablet   Other Relevant Orders   Lipid panel   Atrial fibrillation (HCC)    Rate controlled and asymptomatic on current meds      Relevant Medications   metoprolol succinate (TOPROL-XL) 25 MG 24 hr tablet   diltiazem (CARDIZEM CD) 240 MG 24 hr capsule   furosemide (LASIX) 20 MG tablet   CHF (congestive heart failure) (HCC)    No recent exacerbations.       Relevant Medications   metoprolol succinate (TOPROL-XL) 25 MG 24 hr tablet   diltiazem (CARDIZEM CD) 240 MG 24 hr capsule   furosemide (LASIX) 20 MG tablet   Hyperglycemia    hgba1c acceptable, minimize simple carbs. Increase exercise as tolerated.       Relevant Orders   Hemoglobin A1c      I have discontinued Beth [name]'s omeprazole, NUTRITIONAL SUPPLEMENT, and albuterol. I am also having her maintain her multivitamin, fish oil-omega-3 fatty acids, cetirizine, PROBIOTIC ADVANCED, aspirin, flecainide, metoprolol succinate, diltiazem, furosemide, and potassium chloride.  Meds ordered this encounter  Medications  . metoprolol succinate (TOPROL-XL) 25 MG 24 hr tablet    Sig: Take 1 tablet (25 mg total) by mouth daily.    Dispense:  90 tablet    Refill:  1  . diltiazem (CARDIZEM CD) 240 MG 24 hr capsule    Sig: TAKE 1 CAPSULE (240 MG TOTAL) BY MOUTH DAILY.    Dispense:  90 capsule    Refill:  1  . furosemide (LASIX) 20 MG tablet    Sig: TAKE 1 TABLET (20 MG TOTAL) BY MOUTH DAILY AS NEEDED FOR EDEMA.    Dispense:  49 tablet    Refill:  5  . potassium chloride (K-DUR,KLOR-CON) 10 MEQ tablet    Sig: Take 1 tablet (10 mEq total) by mouth  daily as needed (for when you take Lasix).    Dispense:  30 tablet    Refill:  5    CMA served as scribe during this visit. History, Physical and Plan performed by medical provider. Documentation and orders reviewed and attested to.  Penni Homans, MD

## 2017-05-29 NOTE — Assessment & Plan Note (Signed)
encouraged heart healthy diet, avoid trans fats, minimize simple carbs and saturated fats. Increase exercise as tolerated 

## 2017-05-30 LAB — TSH: TSH: 4.34 u[IU]/mL (ref 0.35–4.50)

## 2017-05-30 LAB — COMPREHENSIVE METABOLIC PANEL
ALK PHOS: 70 U/L (ref 39–117)
ALT: 9 U/L (ref 0–35)
AST: 16 U/L (ref 0–37)
Albumin: 4 g/dL (ref 3.5–5.2)
BILIRUBIN TOTAL: 0.4 mg/dL (ref 0.2–1.2)
BUN: 19 mg/dL (ref 6–23)
CALCIUM: 9.9 mg/dL (ref 8.4–10.5)
CO2: 28 mEq/L (ref 19–32)
Chloride: 101 mEq/L (ref 96–112)
Creatinine, Ser: 0.72 mg/dL (ref 0.40–1.20)
GFR: 80.62 mL/min (ref >60.00)
GLUCOSE: 92 mg/dL (ref 70–99)
Potassium: 4.1 mEq/L (ref 3.5–5.1)
Sodium: 136 mEq/L (ref 135–145)
TOTAL PROTEIN: 8.1 g/dL (ref 6.0–8.3)

## 2017-05-30 LAB — CBC
HCT: 38.3 % (ref 36.0–46.0)
HEMOGLOBIN: 13 g/dL (ref 12.0–15.0)
MCHC: 33.8 g/dL (ref 30.0–36.0)
MCV: 90.2 fl (ref 78.0–100.0)
PLATELETS: 367 10*3/uL (ref 150.0–400.0)
RBC: 4.25 Mil/uL (ref 3.87–5.11)
RDW: 14 % (ref 11.5–15.5)
WBC: 7.9 10*3/uL (ref 4.0–10.5)

## 2017-05-30 LAB — VITAMIN D 25 HYDROXY (VIT D DEFICIENCY, FRACTURES): VITD: 28.66 ng/mL — AB (ref 30.00–100.00)

## 2017-05-30 LAB — LIPID PANEL
Cholesterol: 169 mg/dL (ref 0–200)
HDL: 66.2 mg/dL (ref >39.00)
LDL Cholesterol: 91 mg/dL (ref 0–99)
NONHDL: 102.76
Total CHOL/HDL Ratio: 3
Triglycerides: 60 mg/dL (ref 0.0–149.0)
VLDL: 12 mg/dL (ref 0.0–40.0)

## 2017-05-30 LAB — HEMOGLOBIN A1C: HEMOGLOBIN A1C: 5.8 % (ref 4.6–6.5)

## 2017-06-22 ENCOUNTER — Encounter: Payer: Self-pay | Admitting: Pulmonary Disease

## 2017-06-22 ENCOUNTER — Ambulatory Visit (INDEPENDENT_AMBULATORY_CARE_PROVIDER_SITE_OTHER)
Admission: RE | Admit: 2017-06-22 | Discharge: 2017-06-22 | Disposition: A | Discharge status: Home / Self Care | Payer: Medicare Other | Source: Ambulatory Visit | Attending: Pulmonary Disease | Admitting: Pulmonary Disease

## 2017-06-22 ENCOUNTER — Ambulatory Visit (INDEPENDENT_AMBULATORY_CARE_PROVIDER_SITE_OTHER): Payer: Medicare Other | Admitting: Pulmonary Disease

## 2017-06-22 VITALS — BP 122/68 | HR 84 | Ht 61.0 in | Wt 101.8 lb

## 2017-06-22 DIAGNOSIS — R911 Solitary pulmonary nodule: Secondary | ICD-10-CM | POA: Diagnosis not present

## 2017-06-22 DIAGNOSIS — J90 Pleural effusion, not elsewhere classified: Secondary | ICD-10-CM

## 2017-06-22 NOTE — Patient Instructions (Signed)
Chest x-ray shows that the lung nodules are stable.  There is no recurrence of fluid which is good news I will see her back in 1 year with repeat x-ray.  Please call us sooner if there is any change in his symptoms.

## 2017-06-22 NOTE — Progress Notes (Signed)
Beth [name]    [id number]    February 18, 1924  Primary Care Physician:Blyth, Bonnita Levan, MD  Referring Physician: Mosie Lukes, MD [place] [address] [place], [place] [address]  Chief complaint:   Follow up for CHF Right pleural effusion- Exudative lymphocyte predominant. Pulmonary nodules  HPI: Beth [name] is here for follow-up of pleural effusion. She has a known history of pulmonary nodules dating back to 2007 but has declined further workup or biopsy. She also has history of atrial fibrillation, pacemaker placement. She was admitted on 01/06/15 with lower extremity edema, fatigue, dyspnea. This was thought to be secondary to heart failure. She was also noted to have a unilateral moderate right pleural effusion. Thoracentesis showed an exudative lymphocyte predominant fluid. >1Lt removed. All cultures and cytology and autoimmune workup has been negative so far.  Since discharge she reports feeling well with no shortness of breath, chest pain, palpitations. She's been taking her Lasix daily. She does not note any lower extremity edema. She's been measuring her weights daily and they have been constant between 100-105 pounds.  She's had a chest x-ray 10/27/16 which showed possible increase in her chronic pulmonary nodules. A CT scan was ordered but deferred as we decided on conservative management.   Interim History: Returns here for follow-up chest x-ray.  Reports feeling breathing is doing well with no new issues.  Outpatient Encounter Medications as of 06/22/2017  Medication Sig  . aspirin 81 MG chewable tablet Chew 81 mg by mouth daily.   . cetirizine (ZYRTEC) 10 MG tablet Take 10 mg by mouth every morning.  . diltiazem (CARDIZEM CD) 240 MG 24 hr capsule TAKE 1 CAPSULE (240 MG TOTAL) BY MOUTH DAILY.  . fish oil-omega-3 fatty acids 1000 MG capsule Take 1 g by mouth daily at 12 noon.   . flecainide (TAMBOCOR) 50 MG tablet TAKE 1 TABLET BY MOUTH EVERY 12 HOURS  .  furosemide (LASIX) 20 MG tablet TAKE 1 TABLET (20 MG TOTAL) BY MOUTH DAILY AS NEEDED FOR EDEMA.  . metoprolol succinate (TOPROL-XL) 25 MG 24 hr tablet Take 1 tablet (25 mg total) by mouth daily.  . Multiple Vitamin (MULTIVITAMIN) tablet Take 1 tablet by mouth daily at 12 noon.   . potassium chloride (K-DUR,KLOR-CON) 10 MEQ tablet Take 1 tablet (10 mEq total) by mouth daily as needed (for when you take Lasix).  . Probiotic Product (PROBIOTIC ADVANCED) CAPS Take 2 capsules by mouth daily. Florastor 250 mg   No facility-administered encounter medications on file as of 06/22/2017.     Allergies as of 06/22/2017 - Review Complete 06/22/2017  Allergen Reaction Noted  . Penicillins Shortness Of Breath, Diarrhea, and Other (See Comments)   . Ciprofloxacin Other (See Comments) 06/15/2010  . Fexofenadine Other (See Comments)     Past Medical History:  Diagnosis Date  . Abdominal pain, unspecified site 08/17/2012  . Acute on chronic combined systolic and diastolic heart failure ([place])   . Allergic state 06/22/2013  . Allergy   . Arthritis   . Atrial fibrillation with rapid ventricular response ([place])   . COPD (chronic obstructive pulmonary disease) ([place])   . Dyspnea   . Glaucoma 07/06/2014  . Hyperglycemia 02/01/2016  . Hypertension   . Hypokalemia 02/2016   Due to Lasix  . Increased urinary frequency 01/02/2014  . Left ankle sprain   . Loss of weight 01/19/2013  . Lung nodule    CT of chest 2007-left loewr  lobar mass- evaluated by pulmonary- patient refused further work up  . Osteoporosis   . Other and unspecified hyperlipidemia 08/17/2012  . Pulmonary HTN ([place])   . Syncope    fall  . Systolic CHF ([place])    EF 62-37% 09/2014  . Tobacco abuse 02/2016   Quit 2 years ago  . Vertigo     Past Surgical History:  Procedure Laterality Date  . ABDOMINAL HYSTERECTOMY    . APPENDECTOMY    . CESAREAN SECTION    . CHOLECYSTECTOMY    . EP IMPLANTABLE DEVICE N/A 09/26/2014   Procedure: Pacemaker  Implant;  Surgeon: Evans Lance, MD;  Location: [facility];  Service: Cardiovascular;  Laterality: N/A;  . EYE SURGERY Right 02/2017   cataract  . HEMORRHOID SURGERY    . HERNIA REPAIR    . THORACENTESIS Right 01/07/15   Exudative Effusion Lymphocyte predominant    Family History  Problem Relation Age of Onset  . Heart disease Mother   . Hypertension Mother   . Diabetes Mother   . Stroke Mother   . Cancer Brother   . Diabetes Unknown   . Cancer Unknown        lung- in distant releatives  . Lupus Brother   . Cholelithiasis Father   . Nephrolithiasis Father     Social History   Socioeconomic History  . Marital status: Widowed    Spouse name: Not on file  . Number of children: Not on file  . Years of education: Not on file  . Highest education level: Not on file  Occupational History  . Not on file  Social Needs  . Financial resource strain: Not on file  . Food insecurity:    Worry: Not on file    Inability: Not on file  . Transportation needs:    Medical: Not on file    Non-medical: Not on file  Tobacco Use  . Smoking status: Former Smoker    Packs/day: 0.10    Years: 60.00    Pack years: 6.00    Types: Cigarettes  . Smokeless tobacco: Never Used  . Tobacco comment: patient has stopped smoking on September 25, 2014  Substance and Sexual Activity  . Alcohol use: No    Alcohol/week: 0.0 oz  . Drug use: No  . Sexual activity: Never    Birth control/protection: Abstinence  Lifestyle  . Physical activity:    Days per week: Not on file    Minutes per session: Not on file  . Stress: Not on file  Relationships  . Social connections:    Talks on phone: Not on file    Gets together: Not on file    Attends religious service: Not on file    Active member of club or organization: Not on file    Attends meetings of clubs or organizations: Not on file    Relationship status: Not on file  . Intimate partner violence:    Fear of current or ex partner: Not on  file    Emotionally abused: Not on file    Physically abused: Not on file    Forced sexual activity: Not on file  Other Topics Concern  . Not on file  Social History Narrative   [facility]:   Patient is originally from [place]. She is lived in [place],, [place], [place], [place], and [place]. She moved to [place] in 2006. 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 No pet exposure. Remote bird exposure. No mold exposure.    Review of systems: Review of Systems  Constitutional: Negative for fever and chills.  HENT: Negative.   Eyes: Negative for blurred vision.  Respiratory: as per HPI  Cardiovascular: Negative for chest pain and palpitations.  Gastrointestinal: Negative for vomiting, diarrhea, blood per rectum. Genitourinary: Negative for dysuria, urgency, frequency and hematuria.  Musculoskeletal: Negative for myalgias, back pain and joint pain.  Skin: Negative for itching and rash.  Neurological: Negative for dizziness, tremors, focal weakness, seizures and loss of consciousness.  Endo/Heme/Allergies: Negative for environmental allergies.  Psychiatric/Behavioral: Negative for depression, suicidal ideas and hallucinations.  All other systems reviewed and are negative.  Physical Exam: Blood pressure 122/68, pulse 84, height 5\' 1"  (1.549 m), weight 101 lb 12.8 oz (46.2 kg), SpO2 93 %. Gen:      No acute distress HEENT:  EOMI, sclera anicteric Neck:     No masses; no thyromegaly Lungs:    Clear to auscultation bilaterally; normal respiratory effort CV:         Regular rate and rhythm; no murmurs Abd:      + bowel sounds; soft, non-tender; no palpable masses, no distension Ext:    No edema; adequate peripheral perfusion Skin:      Warm and dry; no rash Neuro: alert and oriented x 3 Psych: normal mood and affect  Data Reviewed: CXR 01/07/15 No pneumothorax following thoracentesis with removal of most of  the effusion from the right side. Small residual effusion remains on the right. Multiple nodular lesions remain, stable. No new opacity. Underlying emphysema. Pacemaker leads attached to right heart.  CXR 07/29/15 Continued improvement of right effusion.   CXR 10/27/16 Hyperinflation, calcified pulmonary nodules which appears slightly more prominent The effusion has not returned.  CXR 06/22/17 Stable calcified nodules.  No effusion. I have reviewed the images personally.  CT chest 01/06/15 1. Numerous progressive partially calcified lung nodules. Differential diagnostic possibilities include treated as well as untreated various metastatic diseases. TB, histoplasmosis, and pulmonary amyloidosis are other considerations, as would be silicosis or other occupational lung diseases. There is no calcified adenopathy as would be expected with TB, histoplasmosis, or occupational exposure. 2. Right pleural effusion. 3. Acute to subacute T 11 compression deformity, with mild canal Narrowing.  Echo 01/09/15 LVEF 40-45% with basal and mid inferior and inferolateral hypokinesis. Moderate LVH. Grade 1 diastolic dysfunction. PASP 47 mm.  Pleural fluid 01/07/15 WBC 2032, 78% lymphs, LDH 133, total protein 5, pH 8. Cultures-no growth to date Cytology-reactive mesothelial cells, no malignancy.  01/09/15 ANA, CRP, RF, CCP, double-stranded DNA, Smith ab, SSA, SSB, coccidiosis antibody all negative Sedimentation rate 53.  Assessment:  Rt Pleural effusion, pulmonary nodules Rt lymphocytic predominant exudative effusion of unclear etiology in 2016. She has a long history of bilateral parenchymal nodules that have remained stable.  However the pleural fluid cytology is negative and she is not interested in further workup, biopsy. She does not have any risk factors for TB. Autoimmune workup, cultures were negative.    The daughter reports that she fell and hit the right side of her chest about a  month before the effusion was discovered. I believe the effusion is due to pulmonary contusion , inflammation. There is no recurrence of effusion on repeat CXR. We will follow up in 12 months  Pulmonary nodules She has chronic pulmonary nodules which may be secondary to prior inflammatory process such as sarcoid or old granulomatous infection.  Continue  observation and conservative management.   Plan/Recommendations: Follow-up in 1 year with chest x-ray.  Marshell Garfinkel MD [facility] Pager [phone] 06/22/2017, 4:19 PM  CC: Mosie Lukes, MD

## 2017-06-22 NOTE — Addendum Note (Signed)
Addended byMarshell Garfinkel on: 06/22/2017 04:56 PM   Modules accepted: Level of Service

## 2017-07-11 ENCOUNTER — Ambulatory Visit (INDEPENDENT_AMBULATORY_CARE_PROVIDER_SITE_OTHER): Payer: Medicare Other | Admitting: *Deleted

## 2017-07-11 ENCOUNTER — Telehealth: Payer: Self-pay | Admitting: Cardiology

## 2017-07-11 DIAGNOSIS — H25812 Combined forms of age-related cataract, left eye: Secondary | ICD-10-CM | POA: Diagnosis not present

## 2017-07-11 DIAGNOSIS — H2512 Age-related nuclear cataract, left eye: Secondary | ICD-10-CM | POA: Diagnosis not present

## 2017-07-11 DIAGNOSIS — I442 Atrioventricular block, complete: Secondary | ICD-10-CM

## 2017-07-11 NOTE — Telephone Encounter (Signed)
Confirmed remote transmission w/ pt daughter.   

## 2017-07-12 NOTE — Progress Notes (Signed)
Remote pacemaker transmission.   

## 2017-07-13 ENCOUNTER — Encounter: Payer: Self-pay | Admitting: Cardiology

## 2017-07-13 LAB — CUP PACEART REMOTE DEVICE CHECK
Brady Statistic AP VP Percent: 0 %
Brady Statistic AS VP Percent: 0 %
Brady Statistic AS VS Percent: 16 %
Date Time Interrogation Session: 20190416203002
Implantable Lead Implant Date: 20160701
Implantable Lead Location: 753860
Implantable Lead Model: 5076
Lead Channel Impedance Value: 423 Ohm
Lead Channel Pacing Threshold Amplitude: 0.625 V
Lead Channel Pacing Threshold Amplitude: 0.625 V
Lead Channel Pacing Threshold Pulse Width: 0.4 ms
Lead Channel Sensing Intrinsic Amplitude: 5.6 mV
Lead Channel Setting Pacing Amplitude: 1.5 V
MDC IDC LEAD IMPLANT DT: 20160701
MDC IDC LEAD LOCATION: 753859
MDC IDC MSMT BATTERY IMPEDANCE: 112 Ohm
MDC IDC MSMT BATTERY REMAINING LONGEVITY: 147 mo
MDC IDC MSMT BATTERY VOLTAGE: 2.79 V
MDC IDC MSMT LEADCHNL RV IMPEDANCE VALUE: 439 Ohm
MDC IDC MSMT LEADCHNL RV PACING THRESHOLD PULSEWIDTH: 0.4 ms
MDC IDC PG IMPLANT DT: 20160701
MDC IDC SET LEADCHNL RV PACING AMPLITUDE: 2.5 V
MDC IDC SET LEADCHNL RV PACING PULSEWIDTH: 0.4 ms
MDC IDC SET LEADCHNL RV SENSING SENSITIVITY: 4 mV
MDC IDC STAT BRADY AP VS PERCENT: 83 %

## 2017-07-20 ENCOUNTER — Other Ambulatory Visit: Payer: Self-pay | Admitting: Family Medicine

## 2017-10-02 ENCOUNTER — Encounter: Payer: Self-pay | Admitting: Internal Medicine

## 2017-10-10 ENCOUNTER — Ambulatory Visit (INDEPENDENT_AMBULATORY_CARE_PROVIDER_SITE_OTHER): Payer: Medicare Other | Admitting: *Deleted

## 2017-10-10 DIAGNOSIS — I442 Atrioventricular block, complete: Secondary | ICD-10-CM

## 2017-10-10 NOTE — Progress Notes (Signed)
Remote pacemaker transmission.   

## 2017-10-11 ENCOUNTER — Encounter: Payer: Self-pay | Admitting: Cardiology

## 2017-10-13 LAB — CUP PACEART REMOTE DEVICE CHECK
Battery Remaining Longevity: 134 mo
Brady Statistic AP VP Percent: 0 %
Brady Statistic AP VS Percent: 84 %
Brady Statistic AS VP Percent: 0 %
Implantable Lead Implant Date: 20160701
Implantable Lead Implant Date: 20160701
Implantable Lead Location: 753860
Implantable Lead Model: 5076
Implantable Pulse Generator Implant Date: 20160701
Lead Channel Impedance Value: 430 Ohm
Lead Channel Impedance Value: 476 Ohm
Lead Channel Pacing Threshold Amplitude: 0.625 V
Lead Channel Pacing Threshold Amplitude: 0.625 V
Lead Channel Pacing Threshold Pulse Width: 0.4 ms
Lead Channel Setting Pacing Amplitude: 1.5 V
MDC IDC LEAD LOCATION: 753859
MDC IDC MSMT BATTERY IMPEDANCE: 159 Ohm
MDC IDC MSMT BATTERY VOLTAGE: 2.79 V
MDC IDC MSMT LEADCHNL RV PACING THRESHOLD PULSEWIDTH: 0.4 ms
MDC IDC SESS DTM: 20190713204301
MDC IDC SET LEADCHNL RV PACING AMPLITUDE: 2.5 V
MDC IDC SET LEADCHNL RV PACING PULSEWIDTH: 0.4 ms
MDC IDC SET LEADCHNL RV SENSING SENSITIVITY: 4 mV
MDC IDC STAT BRADY AS VS PERCENT: 16 %

## 2017-10-16 ENCOUNTER — Ambulatory Visit (INDEPENDENT_AMBULATORY_CARE_PROVIDER_SITE_OTHER): Payer: Medicare Other | Admitting: Internal Medicine

## 2017-10-16 ENCOUNTER — Encounter: Payer: Self-pay | Admitting: Internal Medicine

## 2017-10-16 VITALS — BP 130/50 | HR 66 | Ht 61.0 in | Wt 104.8 lb

## 2017-10-16 DIAGNOSIS — I495 Sick sinus syndrome: Secondary | ICD-10-CM

## 2017-10-16 DIAGNOSIS — Z950 Presence of cardiac pacemaker: Secondary | ICD-10-CM | POA: Diagnosis not present

## 2017-10-16 DIAGNOSIS — I442 Atrioventricular block, complete: Secondary | ICD-10-CM | POA: Diagnosis not present

## 2017-10-16 LAB — CUP PACEART INCLINIC DEVICE CHECK
Battery Impedance: 159 Ohm
Brady Statistic AP VP Percent: 0 %
Brady Statistic AP VS Percent: 84 %
Brady Statistic AS VS Percent: 16 %
Date Time Interrogation Session: 20190722170257
Implantable Lead Implant Date: 20160701
Implantable Lead Location: 753860
Implantable Lead Model: 5076
Implantable Lead Model: 5076
Lead Channel Pacing Threshold Amplitude: 0.5 V
Lead Channel Pacing Threshold Amplitude: 0.75 V
Lead Channel Sensing Intrinsic Amplitude: 11.2 mV
Lead Channel Setting Pacing Amplitude: 1.5 V
Lead Channel Setting Pacing Pulse Width: 0.4 ms
Lead Channel Setting Sensing Sensitivity: 4 mV
MDC IDC LEAD IMPLANT DT: 20160701
MDC IDC LEAD LOCATION: 753859
MDC IDC MSMT BATTERY REMAINING LONGEVITY: 134 mo
MDC IDC MSMT BATTERY VOLTAGE: 2.79 V
MDC IDC MSMT LEADCHNL RA IMPEDANCE VALUE: 408 Ohm
MDC IDC MSMT LEADCHNL RA PACING THRESHOLD PULSEWIDTH: 0.4 ms
MDC IDC MSMT LEADCHNL RA SENSING INTR AMPL: 1.4 mV
MDC IDC MSMT LEADCHNL RV IMPEDANCE VALUE: 458 Ohm
MDC IDC MSMT LEADCHNL RV PACING THRESHOLD PULSEWIDTH: 0.4 ms
MDC IDC PG IMPLANT DT: 20160701
MDC IDC SET LEADCHNL RV PACING AMPLITUDE: 2.5 V
MDC IDC STAT BRADY AS VP PERCENT: 0 %

## 2017-10-16 MED ORDER — DILTIAZEM HCL ER COATED BEADS 240 MG PO CP24: ORAL_CAPSULE | ORAL | 3 refills | Status: DC

## 2017-10-16 MED ORDER — POTASSIUM CHLORIDE CRYS ER 10 MEQ PO TBCR: 10.0000 meq | EXTENDED_RELEASE_TABLET | Freq: Every day | ORAL | 3 refills | Status: AC | PRN

## 2017-10-16 MED ORDER — FLECAINIDE ACETATE 50 MG PO TABS: 50.0000 mg | ORAL_TABLET | Freq: Two times a day (BID) | ORAL | 3 refills | Status: AC

## 2017-10-16 MED ORDER — METOPROLOL SUCCINATE ER 25 MG PO TB24: 25.0000 mg | ORAL_TABLET | Freq: Every day | ORAL | 3 refills | Status: AC

## 2017-10-16 MED ORDER — FUROSEMIDE 20 MG PO TABS: ORAL_TABLET | ORAL | 3 refills | Status: AC

## 2017-10-16 NOTE — Patient Instructions (Addendum)
Medication Instructions:  The current medical regimen is effective;  continue present plan and medications.  Follow-Up: Follow up as needed with Dr Lovena Le.  Remote monitoring is used to monitor your Pacemaker of ICD from home. This monitoring reduces the number of office visits required to check your device to one time per year. It allows [place] to keep an eye on the functioning of your device to ensure it is working properly. You are scheduled for a device check from home on 01/09/18. You may send your transmission at any time that day. If you have a wireless device, the transmission will be sent automatically. After your physician reviews your transmission, you will receive a postcard with your next transmission date.  If you need a refill on your cardiac medications before your next appointment, please call your pharmacy.  Thank you for choosing [facility]!!

## 2017-10-16 NOTE — Progress Notes (Signed)
HPI Beth [name] returns today for followup. She is a pleasant elderly woman with symptomatic bradycardia, s/p PPM insertion.  No syncope. She is fairly sedentary. She has had no recurrent syncope since her PPM insertion.   Allergies  Allergen Reactions  . Penicillins Shortness Of Breath, Diarrhea and Other (See Comments)    Stomach cramping  . Ciprofloxacin Other (See Comments)    myalgias  . Fexofenadine Other (See Comments)    unknown     Current Outpatient Medications  Medication Sig Dispense Refill  . aspirin 81 MG chewable tablet Chew 81 mg by mouth daily.     . cetirizine (ZYRTEC) 10 MG tablet Take 10 mg by mouth every morning.    . diltiazem (CARDIZEM CD) 240 MG 24 hr capsule TAKE 1 CAPSULE (240 MG TOTAL) BY MOUTH DAILY. 90 capsule 1  . fish oil-omega-3 fatty acids 1000 MG capsule Take 1 g by mouth daily at 12 noon.     . flecainide (TAMBOCOR) 50 MG tablet TAKE 1 TABLET BY MOUTH EVERY 12 HOURS 60 tablet 2  . furosemide (LASIX) 20 MG tablet TAKE 1 TABLET (20 MG TOTAL) BY MOUTH DAILY AS NEEDED FOR EDEMA. 49 tablet 5  . metoprolol succinate (TOPROL-XL) 25 MG 24 hr tablet Take 1 tablet (25 mg total) by mouth daily. 90 tablet 1  . Multiple Vitamin (MULTIVITAMIN) tablet Take 1 tablet by mouth daily at 12 noon.     . potassium chloride (K-DUR,KLOR-CON) 10 MEQ tablet Take 1 tablet (10 mEq total) by mouth daily as needed (for when you take Lasix). 30 tablet 5  . Probiotic Product (PROBIOTIC ADVANCED) CAPS Take 2 capsules by mouth daily. Florastor 250 mg     No current facility-administered medications for this visit.      Past Medical History:  Diagnosis Date  . Abdominal pain, unspecified site 08/17/2012  . Acute on chronic combined systolic and diastolic heart failure ([facility])   . Allergic state 06/22/2013  . Allergy   . Arthritis   . Atrial fibrillation with rapid ventricular response ([facility])   . COPD (chronic obstructive pulmonary disease) ([place])   . Dyspnea   .  Glaucoma 07/06/2014  . Hyperglycemia 02/01/2016  . Hypertension   . Hypokalemia 02/2016   Due to Lasix  . Increased urinary frequency 01/02/2014  . Left ankle sprain   . Loss of weight 01/19/2013  . Lung nodule    CT of chest 2007-left loewr lobar mass- evaluated by pulmonary- patient refused further work up  . Osteoporosis   . Other and unspecified hyperlipidemia 08/17/2012  . Pulmonary HTN ([place])   . Syncope    fall  . Systolic CHF ([place])    EF 98-92% 09/2014  . Tobacco abuse 02/2016   Quit 2 years ago  . Vertigo     ROS:   All systems reviewed and negative except as noted in the HPI.   Past Surgical History:  Procedure Laterality Date  . ABDOMINAL HYSTERECTOMY    . APPENDECTOMY    . CESAREAN SECTION    . CHOLECYSTECTOMY    . EP IMPLANTABLE DEVICE N/A 09/26/2014   Procedure: Pacemaker Implant;  Surgeon: Evans Lance, MD;  Location: [facility];  Service: Cardiovascular;  Laterality: N/A;  . EYE SURGERY Right 02/2017   cataract  . HEMORRHOID SURGERY    . HERNIA REPAIR    . THORACENTESIS Right 01/07/15   Exudative Effusion Lymphocyte predominant     Family History  Problem Relation Age of Onset  . Heart disease Mother   . Hypertension Mother   . Diabetes Mother   . Stroke Mother   . Cancer Brother   . Diabetes Unknown   . Cancer Unknown        lung- in distant releatives  . Lupus Brother   . Cholelithiasis Father   . Nephrolithiasis Father      Social History   Socioeconomic History  . Marital status: Widowed    Spouse name: Not on file  . Number of children: Not on file  . Years of education: Not on file  . Highest education level: Not on file  Occupational History  . Not on file  Social Needs  . Financial resource strain: Not on file  . Food insecurity:    Worry: Not on file    Inability: Not on file  . Transportation needs:    Medical: Not on file    Non-medical: Not on file  Tobacco Use  . Smoking status: Former Smoker    Packs/day:  0.10    Years: 60.00    Pack years: 6.00    Types: Cigarettes  . Smokeless tobacco: Never Used  . Tobacco comment: patient has stopped smoking on September 25, 2014  Substance and Sexual Activity  . Alcohol use: No    Alcohol/week: 0.0 oz  . Drug use: No  . Sexual activity: Never    Birth control/protection: Abstinence  Lifestyle  . Physical activity:    Days per week: Not on file    Minutes per session: Not on file  . Stress: Not on file  Relationships  . Social connections:    Talks on phone: Not on file    Gets together: Not on file    Attends religious service: Not on file    Active member of club or organization: Not on file    Attends meetings of clubs or organizations: Not on file    Relationship status: Not on file  . Intimate partner violence:    Fear of current or ex partner: Not on file    Emotionally abused: Not on file    Physically abused: Not on file    Forced sexual activity: Not on file  Other Topics Concern  . Not on file  Social History Narrative   [facility]:   Patient is originally from [place]. She is lived in [place],, [place], [place], [place], and [place]. She moved to [place] in 2006. 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 No pet exposure. Remote bird exposure. No mold exposure.     BP (!) 130/50   Ht 5\' 1"  (1.549 m)   Wt 104 lb 12.8 oz (47.5 kg)   BMI 19.80 kg/m   Physical Exam:  Well appearing elderly woman, NAD HEENT: Unremarkable Neck:  No JVD, no thyromegally Lymphatics:  No adenopathy Back:  No CVA tenderness Lungs:  Clear with no wheezes HEART:  Regular rate rhythm, no murmurs, no rubs, no clicks Abd:  soft, positive bowel sounds, no organomegally, no rebound, no guarding Ext:  2 plus pulses, no edema, no cyanosis, no clubbing Skin:  No rashes no nodules Neuro:  CN II through XII intact, motor grossly intact  EKG - nsr  DEVICE  Normal device function.   See PaceArt for details.   Assess/Plan: 1. Sinus node dysfunction - she is asymptomatic. Will follow. 2. PAF - she is maintaining NSR 3. PPM -  her medtronic DDD is workingnormally. Will recheck in several months.  Mikle Bosworth.D.

## 2017-10-17 ENCOUNTER — Encounter: Payer: Self-pay | Admitting: Internal Medicine

## 2017-11-09 ENCOUNTER — Ambulatory Visit: Payer: Medicare Other | Admitting: Family Medicine

## 2017-11-13 ENCOUNTER — Encounter: Payer: Self-pay | Admitting: Family Medicine

## 2017-11-13 ENCOUNTER — Ambulatory Visit (INDEPENDENT_AMBULATORY_CARE_PROVIDER_SITE_OTHER): Payer: Medicare Other | Admitting: Family Medicine

## 2017-11-13 VITALS — BP 154/60 | HR 67 | Temp 97.5°F | Resp 18 | Wt 106.0 lb

## 2017-11-13 DIAGNOSIS — I459 Conduction disorder, unspecified: Secondary | ICD-10-CM | POA: Diagnosis not present

## 2017-11-13 DIAGNOSIS — R739 Hyperglycemia, unspecified: Secondary | ICD-10-CM

## 2017-11-13 DIAGNOSIS — I10 Essential (primary) hypertension: Secondary | ICD-10-CM | POA: Diagnosis not present

## 2017-11-13 DIAGNOSIS — E782 Mixed hyperlipidemia: Secondary | ICD-10-CM

## 2017-11-13 DIAGNOSIS — I4891 Unspecified atrial fibrillation: Secondary | ICD-10-CM | POA: Diagnosis not present

## 2017-11-13 DIAGNOSIS — E559 Vitamin D deficiency, unspecified: Secondary | ICD-10-CM

## 2017-11-13 NOTE — Assessment & Plan Note (Signed)
Well controlled, no changes to meds. Encouraged heart healthy diet such as the DASH diet and exercise as tolerated.  °

## 2017-11-13 NOTE — Assessment & Plan Note (Addendum)
Rate controlled on current meds. On a daily aspirin due to high fall risk.

## 2017-11-13 NOTE — Patient Instructions (Signed)

## 2017-11-13 NOTE — Assessment & Plan Note (Signed)
Encouraged heart healthy diet, increase exercise, avoid trans fats, consider a krill oil cap daily 

## 2017-11-13 NOTE — Assessment & Plan Note (Signed)
hgba1c acceptable, minimize simple carbs. Increase exercise as tolerated.  

## 2017-11-13 NOTE — Progress Notes (Signed)
Subjective:  I acted as a Education administrator for Dr. Charlett Blake. [name], [place]  Patient ID: Beth [name], female    DOB: [birth year], 84 y.o.   MRN: [id number]  No chief complaint on file.   HPI  Patient is in today for 6 month follow up and accompanied by family. They are looking in to moving out of state soon. She denies any recent febrile illness or hospitalizations. Denies CP/palp/SOB/HA/congestion/fevers/GI or GU c/o. Taking meds as prescribed  Patient Care Team: Mosie Lukes, MD as PCP - General (Family Medicine)   Past Medical History:  Diagnosis Date  . Abdominal pain, unspecified site 08/17/2012  . Acute on chronic combined systolic and diastolic heart failure ([facility])   . Allergic state 06/22/2013  . Allergy   . Arthritis   . Atrial fibrillation with rapid ventricular response ([facility])   . COPD (chronic obstructive pulmonary disease) ([facility])   . Dyspnea   . Glaucoma 07/06/2014  . Hyperglycemia 02/01/2016  . Hypertension   . Hypokalemia 02/2016   Due to Lasix  . Increased urinary frequency 01/02/2014  . Left ankle sprain   . Loss of weight 01/19/2013  . Lung nodule    CT of chest 2007-left loewr lobar mass- evaluated by pulmonary- patient refused further work up  . Osteoporosis   . Other and unspecified hyperlipidemia 08/17/2012  . Pulmonary HTN ([facility])   . Syncope    fall  . Systolic CHF ([facility])    EF 88-50% 09/2014  . Tobacco abuse 02/2016   Quit 2 years ago  . Vertigo     Past Surgical History:  Procedure Laterality Date  . ABDOMINAL HYSTERECTOMY    . APPENDECTOMY    . CESAREAN SECTION    . CHOLECYSTECTOMY    . EP IMPLANTABLE DEVICE N/A 09/26/2014   Procedure: Pacemaker Implant;  Surgeon: Evans Lance, MD;  Location: [facility];  Service: Cardiovascular;  Laterality: N/A;  . EYE SURGERY Right 02/2017   cataract  . HEMORRHOID SURGERY    . HERNIA REPAIR    . THORACENTESIS Right 01/07/15   Exudative Effusion Lymphocyte predominant    Family History  Problem  Relation Age of Onset  . Heart disease Mother   . Hypertension Mother   . Diabetes Mother   . Stroke Mother   . Cancer Brother   . Diabetes Unknown   . Cancer Unknown        lung- in distant releatives  . Lupus Brother   . Cholelithiasis Father   . Nephrolithiasis Father     Social History   Socioeconomic History  . Marital status: Widowed    Spouse name: Not on file  . Number of children: Not on file  . Years of education: Not on file  . Highest education level: Not on file  Occupational History  . Not on file  Social Needs  . Financial resource strain: Not on file  . Food insecurity:    Worry: Not on file    Inability: Not on file  . Transportation needs:    Medical: Not on file    Non-medical: Not on file  Tobacco Use  . Smoking status: Former Smoker    Packs/day: 0.10    Years: 60.00    Pack years: 6.00    Types: Cigarettes  . Smokeless tobacco: Never Used  . Tobacco comment: patient has stopped smoking on September 25, 2014  Substance and Sexual Activity  . Alcohol use: No    Alcohol/week:  0.0 standard drinks  . Drug use: No  . Sexual activity: Never    Birth control/protection: Abstinence  Lifestyle  . Physical activity:    Days per week: Not on file    Minutes per session: Not on file  . Stress: Not on file  Relationships  . Social connections:    Talks on phone: Not on file    Gets together: Not on file    Attends religious service: Not on file    Active member of club or organization: Not on file    Attends meetings of clubs or organizations: Not on file    Relationship status: Not on file  . Intimate partner violence:    Fear of current or ex partner: Not on file    Emotionally abused: Not on file    Physically abused: Not on file    Forced sexual activity: Not on file  Other Topics Concern  . Not on file  Social History Narrative   [facility]:   Patient is originally from [place]. She is lived in [place],, [place], [place], [place],  and [place]. She moved to [place] in 2006. 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 No pet exposure. Remote bird exposure. No mold exposure.    Outpatient Medications Prior to Visit  Medication Sig Dispense Refill  . aspirin 81 MG chewable tablet Chew 81 mg by mouth daily.     . cetirizine (ZYRTEC) 10 MG tablet Take 10 mg by mouth every morning.    . diltiazem (CARDIZEM CD) 240 MG 24 hr capsule TAKE 1 CAPSULE (240 MG TOTAL) BY MOUTH DAILY. 90 capsule 3  . fish oil-omega-3 fatty acids 1000 MG capsule Take 1 g by mouth daily at 12 noon.     . flecainide (TAMBOCOR) 50 MG tablet Take 1 tablet (50 mg total) by mouth every 12 (twelve) hours. 180 tablet 3  . furosemide (LASIX) 20 MG tablet TAKE 1 TABLET (20 MG TOTAL) BY MOUTH DAILY AS NEEDED FOR EDEMA. 90 tablet 3  . metoprolol succinate (TOPROL-XL) 25 MG 24 hr tablet Take 1 tablet (25 mg total) by mouth daily. 90 tablet 3  . Multiple Vitamin (MULTIVITAMIN) tablet Take 1 tablet by mouth daily at 12 noon.     . potassium chloride (K-DUR,KLOR-CON) 10 MEQ tablet Take 1 tablet (10 mEq total) by mouth daily as needed (for when you take Lasix). 90 tablet 3  . Probiotic Product (PROBIOTIC ADVANCED) CAPS Take 2 capsules by mouth daily. Florastor 250 mg     No facility-administered medications prior to visit.     Allergies  Allergen Reactions  . Penicillins Shortness Of Breath, Diarrhea and Other (See Comments)    Stomach cramping  . Ciprofloxacin Other (See Comments)    myalgias  . Fexofenadine Other (See Comments)    unknown    Review of Systems  Constitutional: Positive for malaise/fatigue. Negative for fever.  HENT: Negative for congestion.   Eyes: Negative for blurred vision.  Respiratory: Negative for shortness of breath.   Cardiovascular: Negative for chest pain, palpitations and leg swelling.  Gastrointestinal: Negative for abdominal pain, blood in stool and nausea.    Genitourinary: Negative for dysuria and frequency.  Musculoskeletal: Negative for falls.  Skin: Negative for rash.  Neurological: Negative for dizziness, loss of consciousness and headaches.  Endo/Heme/Allergies: Negative for environmental allergies.  Psychiatric/Behavioral: Negative for depression. The patient is not nervous/anxious.        Objective:  Physical Exam  Constitutional: She is oriented to person, place, and time. She appears well-developed and well-nourished. No distress.  HENT:  Head: Normocephalic and atraumatic.  Nose: Nose normal.  Eyes: Right eye exhibits no discharge. Left eye exhibits no discharge.  Neck: Normal range of motion. Neck supple.  Cardiovascular: Normal rate and regular rhythm.  No murmur heard. Pulmonary/Chest: Effort normal and breath sounds normal.  Abdominal: Soft. Bowel sounds are normal. There is no tenderness.  Musculoskeletal: She exhibits no edema.  Neurological: She is alert and oriented to person, place, and time.  Skin: Skin is warm and dry.  Psychiatric: She has a normal mood and affect.  Nursing note and vitals reviewed.   BP (!) 154/60 (BP Location: Left Arm, Patient Position: Sitting, Cuff Size: Normal)   Pulse 67   Temp (!) 97.5 F (36.4 C) (Oral)   Resp 18   Wt 106 lb (48.1 kg)   SpO2 95%   BMI 20.03 kg/m  Wt Readings from Last 3 Encounters:  11/13/17 106 lb (48.1 kg)  10/16/17 104 lb 12.8 oz (47.5 kg)  06/22/17 101 lb 12.8 oz (46.2 kg)   BP Readings from Last 3 Encounters:  11/13/17 (!) 154/60  10/16/17 (!) 130/50  06/22/17 122/68     Immunization History  Administered Date(s) Administered  . Pneumococcal Conjugate-13 06/11/2015  . Pneumococcal Polysaccharide-23 05/19/2008  . Td 10/07/2003    Health Maintenance  Topic Date Due  . FOOT EXAM  12/28/1935  . OPHTHALMOLOGY EXAM  12/28/1933  . URINE MICROALBUMIN  12/28/1935  . TETANUS/TDAP  10/06/2013  . INFLUENZA VACCINE  10/26/2017  . HEMOGLOBIN A1C   05/16/2018  . DEXA SCAN  Completed  . PNA vac Low Risk Adult  Completed    Lab Results  Component Value Date   WBC 6.9 11/13/2017   HGB 13.7 11/13/2017   HCT 40.4 11/13/2017   PLT 265.0 11/13/2017   GLUCOSE 85 11/13/2017   CHOL 202 (H) 11/13/2017   TRIG 80.0 11/13/2017   HDL 74.70 11/13/2017   LDLDIRECT 127.9 10/05/2011   LDLCALC 111 (H) 11/13/2017   ALT 16 11/13/2017   AST 20 11/13/2017   NA 138 11/13/2017   K 4.2 11/13/2017   CL 102 11/13/2017   CREATININE 0.75 11/13/2017   BUN 17 11/13/2017   CO2 26 11/13/2017   TSH 3.61 11/13/2017   INR 1.27 09/27/2014   HGBA1C 5.7 11/13/2017    Lab Results  Component Value Date   TSH 3.61 11/13/2017   Lab Results  Component Value Date   WBC 6.9 11/13/2017   HGB 13.7 11/13/2017   HCT 40.4 11/13/2017   MCV 89.8 11/13/2017   PLT 265.0 11/13/2017   Lab Results  Component Value Date   NA 138 11/13/2017   K 4.2 11/13/2017   CO2 26 11/13/2017   GLUCOSE 85 11/13/2017   BUN 17 11/13/2017   CREATININE 0.75 11/13/2017   BILITOT 0.5 11/13/2017   ALKPHOS 71 11/13/2017   AST 20 11/13/2017   ALT 16 11/13/2017   PROT 7.7 11/13/2017   ALBUMIN 4.3 11/13/2017   CALCIUM 9.7 11/13/2017   ANIONGAP 8 03/13/2016   GFR 76.84 11/13/2017   Lab Results  Component Value Date   CHOL 202 (H) 11/13/2017   Lab Results  Component Value Date   HDL 74.70 11/13/2017   Lab Results  Component Value Date   LDLCALC 111 (H) 11/13/2017   Lab Results  Component Value Date   TRIG 80.0 11/13/2017  Lab Results  Component Value Date   CHOLHDL 3 11/13/2017   Lab Results  Component Value Date   HGBA1C 5.7 11/13/2017         Assessment & Plan:   Problem List Items Addressed This Visit    Essential hypertension (Chronic)    Well controlled, no changes to meds. Encouraged heart healthy diet such as the DASH diet and exercise as tolerated.       Relevant Orders   CBC (Completed)   Comprehensive metabolic panel (Completed)   TSH  (Completed)   Vitamin D deficiency    Supplement and monitor      Relevant Orders   VITAMIN D 25 Hydroxy (Vit-D Deficiency, Fractures) (Completed)   Hyperlipidemia    Encouraged heart healthy diet, increase exercise, avoid trans fats, consider a krill oil cap daily      Relevant Orders   Lipid panel (Completed)   Syncope - Primary   Atrial fibrillation ([place])    Rate controlled on current meds. On a daily aspirin due to high fall risk.      Hyperglycemia    hgba1c acceptable, minimize simple carbs. Increase exercise as tolerated.      Relevant Orders   Hemoglobin A1c (Completed)      I am having Beth [name] maintain her multivitamin, fish oil-omega-3 fatty acids, cetirizine, PROBIOTIC ADVANCED, aspirin, diltiazem, flecainide, furosemide, metoprolol succinate, and potassium chloride.  No orders of the defined types were placed in this encounter.   CMA served as Education administrator during this visit. History, Physical and Plan performed by medical provider. Documentation and orders reviewed and attested to.  Penni Homans, MD

## 2017-11-14 LAB — CBC
HEMATOCRIT: 40.4 % (ref 36.0–46.0)
Hemoglobin: 13.7 g/dL (ref 12.0–15.0)
MCHC: 33.9 g/dL (ref 30.0–36.0)
MCV: 89.8 fl (ref 78.0–100.0)
PLATELETS: 265 10*3/uL (ref 150.0–400.0)
RBC: 4.5 Mil/uL (ref 3.87–5.11)
RDW: 14.1 % (ref 11.5–15.5)
WBC: 6.9 10*3/uL (ref 4.0–10.5)

## 2017-11-14 LAB — LIPID PANEL
CHOLESTEROL: 202 mg/dL — AB (ref 0–200)
HDL: 74.7 mg/dL (ref >39.00)
LDL Cholesterol: 111 mg/dL — ABNORMAL HIGH (ref 0–99)
NONHDL: 127.36
TRIGLYCERIDES: 80 mg/dL (ref 0.0–149.0)
Total CHOL/HDL Ratio: 3
VLDL: 16 mg/dL (ref 0.0–40.0)

## 2017-11-14 LAB — COMPREHENSIVE METABOLIC PANEL
ALBUMIN: 4.3 g/dL (ref 3.5–5.2)
ALK PHOS: 71 U/L (ref 39–117)
ALT: 16 U/L (ref 0–35)
AST: 20 U/L (ref 0–37)
BILIRUBIN TOTAL: 0.5 mg/dL (ref 0.2–1.2)
BUN: 17 mg/dL (ref 6–23)
CALCIUM: 9.7 mg/dL (ref 8.4–10.5)
CO2: 26 mEq/L (ref 19–32)
Chloride: 102 mEq/L (ref 96–112)
Creatinine, Ser: 0.75 mg/dL (ref 0.40–1.20)
GFR: 76.84 mL/min (ref >60.00)
GLUCOSE: 85 mg/dL (ref 70–99)
POTASSIUM: 4.2 meq/L (ref 3.5–5.1)
Sodium: 138 mEq/L (ref 135–145)
Total Protein: 7.7 g/dL (ref 6.0–8.3)

## 2017-11-14 LAB — HEMOGLOBIN A1C: HEMOGLOBIN A1C: 5.7 % (ref 4.6–6.5)

## 2017-11-14 LAB — TSH: TSH: 3.61 u[IU]/mL (ref 0.35–4.50)

## 2017-11-14 LAB — VITAMIN D 25 HYDROXY (VIT D DEFICIENCY, FRACTURES): VITD: 26.33 ng/mL — ABNORMAL LOW (ref 30.00–100.00)

## 2017-11-17 MED ORDER — VITAMIN D (ERGOCALCIFEROL) 1.25 MG (50000 UNIT) PO CAPS: 50000.0000 [IU] | ORAL_CAPSULE | ORAL | 4 refills | Status: AC

## 2017-11-19 NOTE — Assessment & Plan Note (Signed)
Supplement and monitor 

## 2017-11-28 DIAGNOSIS — H16223 Keratoconjunctivitis sicca, not specified as Sjogren's, bilateral: Secondary | ICD-10-CM | POA: Diagnosis not present

## 2017-11-28 DIAGNOSIS — H402232 Chronic angle-closure glaucoma, bilateral, moderate stage: Secondary | ICD-10-CM | POA: Diagnosis not present

## 2017-11-30 ENCOUNTER — Ambulatory Visit: Payer: Medicare Other | Admitting: Family Medicine

## 2017-12-14 DIAGNOSIS — M79675 Pain in left toe(s): Secondary | ICD-10-CM | POA: Diagnosis not present

## 2017-12-14 DIAGNOSIS — L603 Nail dystrophy: Secondary | ICD-10-CM | POA: Diagnosis not present

## 2017-12-14 DIAGNOSIS — M79674 Pain in right toe(s): Secondary | ICD-10-CM | POA: Diagnosis not present

## 2017-12-14 DIAGNOSIS — B351 Tinea unguium: Secondary | ICD-10-CM | POA: Diagnosis not present

## 2017-12-14 DIAGNOSIS — I739 Peripheral vascular disease, unspecified: Secondary | ICD-10-CM | POA: Diagnosis not present

## 2018-01-09 ENCOUNTER — Telehealth: Payer: Self-pay | Admitting: Cardiology

## 2018-01-09 ENCOUNTER — Ambulatory Visit (INDEPENDENT_AMBULATORY_CARE_PROVIDER_SITE_OTHER): Payer: Medicare Other | Admitting: *Deleted

## 2018-01-09 DIAGNOSIS — I442 Atrioventricular block, complete: Secondary | ICD-10-CM

## 2018-01-09 NOTE — Telephone Encounter (Signed)
LMOVM reminding pt to send remote transmission.   

## 2018-01-10 NOTE — Progress Notes (Signed)
Remote pacemaker transmission.   

## 2018-02-02 LAB — CUP PACEART REMOTE DEVICE CHECK
Battery Impedance: 159 Ohm
Battery Voltage: 2.79 V
Brady Statistic AP VS Percent: 91 %
Brady Statistic AS VP Percent: 0 %
Brady Statistic AS VS Percent: 9 %
Date Time Interrogation Session: 20191015220130
Implantable Lead Implant Date: 20160701
Implantable Lead Location: 753860
Implantable Lead Model: 5076
Implantable Pulse Generator Implant Date: 20160701
Lead Channel Impedance Value: 449 Ohm
Lead Channel Pacing Threshold Amplitude: 0.625 V
Lead Channel Pacing Threshold Amplitude: 0.75 V
Lead Channel Pacing Threshold Pulse Width: 0.4 ms
Lead Channel Pacing Threshold Pulse Width: 0.4 ms
Lead Channel Setting Pacing Amplitude: 1.5 V
Lead Channel Setting Pacing Pulse Width: 0.4 ms
MDC IDC LEAD IMPLANT DT: 20160701
MDC IDC LEAD LOCATION: 753859
MDC IDC MSMT BATTERY REMAINING LONGEVITY: 134 mo
MDC IDC MSMT LEADCHNL RA IMPEDANCE VALUE: 435 Ohm
MDC IDC SET LEADCHNL RV PACING AMPLITUDE: 2.5 V
MDC IDC SET LEADCHNL RV SENSING SENSITIVITY: 2.8 mV
MDC IDC STAT BRADY AP VP PERCENT: 0 %

## 2018-02-09 ENCOUNTER — Telehealth: Payer: Self-pay

## 2018-02-09 NOTE — Telephone Encounter (Signed)
Spoke with pts daughter informed her that I had reviewed the transmission that it was normal, pts daughter appreciative of call back.

## 2018-02-09 NOTE — Telephone Encounter (Signed)
Pt is feeling dizzy. ON November 5 and 13th she was vomiting. Pt daughter also states that the pt does not feel well today. Pt eyes are having a hard time focusing. Pt states she felt better after she ate something. Pt has an upcoming Dr's appointment on Tuesday. Pt daughter would like for someone to give her a call back. She did send a manual transmission. Pt has not been in pain but been having burning sensation underneath her pacemaker. Best phone number for [facility] [phone]

## 2018-02-13 DIAGNOSIS — Z789 Other specified health status: Secondary | ICD-10-CM | POA: Diagnosis not present

## 2018-02-13 DIAGNOSIS — I480 Paroxysmal atrial fibrillation: Secondary | ICD-10-CM | POA: Diagnosis not present

## 2018-02-13 DIAGNOSIS — I110 Hypertensive heart disease with heart failure: Secondary | ICD-10-CM | POA: Diagnosis not present

## 2018-02-13 DIAGNOSIS — Z79899 Other long term (current) drug therapy: Secondary | ICD-10-CM | POA: Diagnosis not present

## 2018-02-13 DIAGNOSIS — E559 Vitamin D deficiency, unspecified: Secondary | ICD-10-CM | POA: Diagnosis not present

## 2018-02-13 DIAGNOSIS — Z139 Encounter for screening, unspecified: Secondary | ICD-10-CM | POA: Diagnosis not present

## 2018-02-13 DIAGNOSIS — J449 Chronic obstructive pulmonary disease, unspecified: Secondary | ICD-10-CM | POA: Diagnosis not present

## 2018-02-13 DIAGNOSIS — M1990 Unspecified osteoarthritis, unspecified site: Secondary | ICD-10-CM | POA: Diagnosis not present

## 2018-02-13 DIAGNOSIS — I10 Essential (primary) hypertension: Secondary | ICD-10-CM | POA: Diagnosis not present

## 2018-02-13 DIAGNOSIS — I5022 Chronic systolic (congestive) heart failure: Secondary | ICD-10-CM | POA: Diagnosis not present

## 2018-04-03 DIAGNOSIS — Z79899 Other long term (current) drug therapy: Secondary | ICD-10-CM | POA: Diagnosis not present

## 2018-04-03 DIAGNOSIS — E559 Vitamin D deficiency, unspecified: Secondary | ICD-10-CM | POA: Diagnosis not present

## 2018-04-03 DIAGNOSIS — I480 Paroxysmal atrial fibrillation: Secondary | ICD-10-CM | POA: Diagnosis not present

## 2018-04-03 DIAGNOSIS — Z789 Other specified health status: Secondary | ICD-10-CM | POA: Diagnosis not present

## 2018-04-03 DIAGNOSIS — J449 Chronic obstructive pulmonary disease, unspecified: Secondary | ICD-10-CM | POA: Diagnosis not present

## 2018-04-03 DIAGNOSIS — I110 Hypertensive heart disease with heart failure: Secondary | ICD-10-CM | POA: Diagnosis not present

## 2018-04-03 DIAGNOSIS — Z139 Encounter for screening, unspecified: Secondary | ICD-10-CM | POA: Diagnosis not present

## 2018-04-03 DIAGNOSIS — I10 Essential (primary) hypertension: Secondary | ICD-10-CM | POA: Diagnosis not present

## 2018-04-03 DIAGNOSIS — M1990 Unspecified osteoarthritis, unspecified site: Secondary | ICD-10-CM | POA: Diagnosis not present

## 2018-04-03 DIAGNOSIS — Z7982 Long term (current) use of aspirin: Secondary | ICD-10-CM | POA: Diagnosis not present

## 2018-04-03 DIAGNOSIS — I509 Heart failure, unspecified: Secondary | ICD-10-CM | POA: Diagnosis not present

## 2018-04-10 ENCOUNTER — Ambulatory Visit (INDEPENDENT_AMBULATORY_CARE_PROVIDER_SITE_OTHER): Payer: Medicare Other

## 2018-04-10 DIAGNOSIS — I495 Sick sinus syndrome: Secondary | ICD-10-CM | POA: Diagnosis not present

## 2018-04-10 DIAGNOSIS — I442 Atrioventricular block, complete: Secondary | ICD-10-CM

## 2018-04-11 NOTE — Progress Notes (Signed)
Remote pacemaker transmission.   

## 2018-04-14 LAB — CUP PACEART REMOTE DEVICE CHECK
Battery Voltage: 2.79 V
Brady Statistic AP VS Percent: 91 %
Brady Statistic AS VS Percent: 9 %
Date Time Interrogation Session: 20200112010027
Implantable Lead Implant Date: 20160701
Implantable Lead Implant Date: 20160701
Implantable Lead Location: 753859
Implantable Lead Location: 753860
Implantable Lead Model: 5076
Implantable Pulse Generator Implant Date: 20160701
Lead Channel Pacing Threshold Pulse Width: 0.4 ms
Lead Channel Pacing Threshold Pulse Width: 0.4 ms
Lead Channel Setting Pacing Pulse Width: 0.4 ms
Lead Channel Setting Sensing Sensitivity: 2.8 mV
MDC IDC MSMT BATTERY IMPEDANCE: 183 Ohm
MDC IDC MSMT BATTERY REMAINING LONGEVITY: 130 mo
MDC IDC MSMT LEADCHNL RA IMPEDANCE VALUE: 460 Ohm
MDC IDC MSMT LEADCHNL RA PACING THRESHOLD AMPLITUDE: 0.625 V
MDC IDC MSMT LEADCHNL RV IMPEDANCE VALUE: 464 Ohm
MDC IDC MSMT LEADCHNL RV PACING THRESHOLD AMPLITUDE: 0.75 V
MDC IDC SET LEADCHNL RA PACING AMPLITUDE: 1.5 V
MDC IDC SET LEADCHNL RV PACING AMPLITUDE: 2.5 V
MDC IDC STAT BRADY AP VP PERCENT: 0 %
MDC IDC STAT BRADY AS VP PERCENT: 0 %

## 2018-04-15 ENCOUNTER — Other Ambulatory Visit: Payer: Self-pay | Admitting: Family Medicine

## 2018-04-23 DIAGNOSIS — H47233 Glaucomatous optic atrophy, bilateral: Secondary | ICD-10-CM | POA: Diagnosis not present

## 2018-04-23 DIAGNOSIS — H26492 Other secondary cataract, left eye: Secondary | ICD-10-CM | POA: Diagnosis not present

## 2018-04-23 DIAGNOSIS — Z961 Presence of intraocular lens: Secondary | ICD-10-CM | POA: Diagnosis not present

## 2018-04-23 DIAGNOSIS — H40053 Ocular hypertension, bilateral: Secondary | ICD-10-CM | POA: Diagnosis not present

## 2018-04-23 DIAGNOSIS — Z9842 Cataract extraction status, left eye: Secondary | ICD-10-CM | POA: Diagnosis not present

## 2018-04-25 DIAGNOSIS — I10 Essential (primary) hypertension: Secondary | ICD-10-CM | POA: Diagnosis not present

## 2018-04-25 DIAGNOSIS — J449 Chronic obstructive pulmonary disease, unspecified: Secondary | ICD-10-CM | POA: Diagnosis not present

## 2018-04-25 DIAGNOSIS — Z950 Presence of cardiac pacemaker: Secondary | ICD-10-CM | POA: Diagnosis not present

## 2018-04-25 DIAGNOSIS — M1990 Unspecified osteoarthritis, unspecified site: Secondary | ICD-10-CM | POA: Diagnosis not present

## 2018-04-25 DIAGNOSIS — I4891 Unspecified atrial fibrillation: Secondary | ICD-10-CM | POA: Diagnosis not present

## 2018-04-25 DIAGNOSIS — I509 Heart failure, unspecified: Secondary | ICD-10-CM | POA: Diagnosis not present

## 2018-05-15 DIAGNOSIS — I519 Heart disease, unspecified: Secondary | ICD-10-CM | POA: Diagnosis not present

## 2018-05-15 DIAGNOSIS — M1990 Unspecified osteoarthritis, unspecified site: Secondary | ICD-10-CM | POA: Diagnosis not present

## 2018-05-15 DIAGNOSIS — J449 Chronic obstructive pulmonary disease, unspecified: Secondary | ICD-10-CM | POA: Diagnosis not present

## 2018-05-15 DIAGNOSIS — I509 Heart failure, unspecified: Secondary | ICD-10-CM | POA: Diagnosis not present

## 2018-05-15 DIAGNOSIS — Z45010 Encounter for checking and testing of cardiac pacemaker pulse generator [battery]: Secondary | ICD-10-CM | POA: Diagnosis not present

## 2018-05-15 DIAGNOSIS — Z950 Presence of cardiac pacemaker: Secondary | ICD-10-CM | POA: Diagnosis not present

## 2018-05-15 DIAGNOSIS — I4891 Unspecified atrial fibrillation: Secondary | ICD-10-CM | POA: Diagnosis not present

## 2018-05-15 DIAGNOSIS — I10 Essential (primary) hypertension: Secondary | ICD-10-CM | POA: Diagnosis not present

## 2018-05-20 DIAGNOSIS — Z23 Encounter for immunization: Secondary | ICD-10-CM | POA: Diagnosis not present

## 2018-05-20 DIAGNOSIS — R51 Headache: Secondary | ICD-10-CM | POA: Diagnosis not present

## 2018-05-20 DIAGNOSIS — M79603 Pain in arm, unspecified: Secondary | ICD-10-CM | POA: Diagnosis not present

## 2018-05-20 DIAGNOSIS — I2510 Atherosclerotic heart disease of native coronary artery without angina pectoris: Secondary | ICD-10-CM | POA: Diagnosis not present

## 2018-05-20 DIAGNOSIS — J811 Chronic pulmonary edema: Secondary | ICD-10-CM | POA: Diagnosis not present

## 2018-05-20 DIAGNOSIS — R0902 Hypoxemia: Secondary | ICD-10-CM | POA: Diagnosis not present

## 2018-05-20 DIAGNOSIS — M531 Cervicobrachial syndrome: Secondary | ICD-10-CM | POA: Diagnosis not present

## 2018-05-20 DIAGNOSIS — G3189 Other specified degenerative diseases of nervous system: Secondary | ICD-10-CM | POA: Diagnosis not present

## 2018-05-20 DIAGNOSIS — S7002XA Contusion of left hip, initial encounter: Secondary | ICD-10-CM | POA: Diagnosis not present

## 2018-05-20 DIAGNOSIS — S79912A Unspecified injury of left hip, initial encounter: Secondary | ICD-10-CM | POA: Diagnosis not present

## 2018-05-20 DIAGNOSIS — S99912A Unspecified injury of left ankle, initial encounter: Secondary | ICD-10-CM | POA: Diagnosis not present

## 2018-05-20 DIAGNOSIS — S8992XA Unspecified injury of left lower leg, initial encounter: Secondary | ICD-10-CM | POA: Diagnosis not present

## 2018-05-20 DIAGNOSIS — W19XXXA Unspecified fall, initial encounter: Secondary | ICD-10-CM | POA: Diagnosis not present

## 2018-05-20 DIAGNOSIS — S199XXA Unspecified injury of neck, initial encounter: Secondary | ICD-10-CM | POA: Diagnosis not present

## 2018-05-20 DIAGNOSIS — I447 Left bundle-branch block, unspecified: Secondary | ICD-10-CM | POA: Diagnosis not present

## 2018-05-20 DIAGNOSIS — R55 Syncope and collapse: Secondary | ICD-10-CM | POA: Diagnosis not present

## 2018-05-20 DIAGNOSIS — M438X4 Other specified deforming dorsopathies, thoracic region: Secondary | ICD-10-CM | POA: Diagnosis not present

## 2018-05-20 DIAGNOSIS — S2020XA Contusion of thorax, unspecified, initial encounter: Secondary | ICD-10-CM | POA: Diagnosis not present

## 2018-05-20 DIAGNOSIS — R9089 Other abnormal findings on diagnostic imaging of central nervous system: Secondary | ICD-10-CM | POA: Diagnosis not present

## 2018-05-20 DIAGNOSIS — S93402A Sprain of unspecified ligament of left ankle, initial encounter: Secondary | ICD-10-CM | POA: Diagnosis not present

## 2018-05-20 DIAGNOSIS — M8580 Other specified disorders of bone density and structure, unspecified site: Secondary | ICD-10-CM | POA: Diagnosis not present

## 2018-05-20 DIAGNOSIS — M542 Cervicalgia: Secondary | ICD-10-CM | POA: Diagnosis not present

## 2018-05-20 DIAGNOSIS — M1712 Unilateral primary osteoarthritis, left knee: Secondary | ICD-10-CM | POA: Diagnosis not present

## 2018-05-20 DIAGNOSIS — S8392XA Sprain of unspecified site of left knee, initial encounter: Secondary | ICD-10-CM | POA: Diagnosis not present

## 2018-05-20 DIAGNOSIS — R918 Other nonspecific abnormal finding of lung field: Secondary | ICD-10-CM | POA: Diagnosis not present

## 2018-05-20 DIAGNOSIS — R509 Fever, unspecified: Secondary | ICD-10-CM | POA: Diagnosis not present

## 2018-05-20 DIAGNOSIS — S99922A Unspecified injury of left foot, initial encounter: Secondary | ICD-10-CM | POA: Diagnosis not present

## 2018-05-20 DIAGNOSIS — S51802A Unspecified open wound of left forearm, initial encounter: Secondary | ICD-10-CM | POA: Diagnosis not present

## 2018-05-20 DIAGNOSIS — K838 Other specified diseases of biliary tract: Secondary | ICD-10-CM | POA: Diagnosis not present

## 2018-05-20 DIAGNOSIS — R0602 Shortness of breath: Secondary | ICD-10-CM | POA: Diagnosis not present

## 2018-05-20 DIAGNOSIS — R930 Abnormal findings on diagnostic imaging of skull and head, not elsewhere classified: Secondary | ICD-10-CM | POA: Diagnosis not present

## 2018-05-20 DIAGNOSIS — M4854XA Collapsed vertebra, not elsewhere classified, thoracic region, initial encounter for fracture: Secondary | ICD-10-CM | POA: Diagnosis not present

## 2018-05-21 DIAGNOSIS — S20219A Contusion of unspecified front wall of thorax, initial encounter: Secondary | ICD-10-CM | POA: Diagnosis not present

## 2018-05-21 DIAGNOSIS — R55 Syncope and collapse: Secondary | ICD-10-CM | POA: Diagnosis not present

## 2018-05-21 DIAGNOSIS — I6523 Occlusion and stenosis of bilateral carotid arteries: Secondary | ICD-10-CM | POA: Diagnosis not present

## 2018-05-21 DIAGNOSIS — R0902 Hypoxemia: Secondary | ICD-10-CM | POA: Diagnosis not present

## 2018-05-21 DIAGNOSIS — W19XXXA Unspecified fall, initial encounter: Secondary | ICD-10-CM | POA: Diagnosis not present

## 2018-05-21 DIAGNOSIS — R0602 Shortness of breath: Secondary | ICD-10-CM | POA: Diagnosis not present

## 2018-07-10 ENCOUNTER — Encounter: Payer: Medicare Other | Admitting: *Deleted

## 2018-07-10 ENCOUNTER — Other Ambulatory Visit: Payer: Self-pay

## 2018-07-11 ENCOUNTER — Telehealth: Payer: Self-pay

## 2018-07-11 NOTE — Telephone Encounter (Signed)
Spoke with patient to remind of missed remote transmission 

## 2018-07-17 ENCOUNTER — Encounter: Payer: Self-pay | Admitting: Cardiology

## 2019-04-26 ENCOUNTER — Other Ambulatory Visit: Payer: Self-pay | Admitting: Family Medicine
